# Patient Record
Sex: Female | Born: 1950
Health system: Southern US, Community
[De-identification: ages and names within clinical notes are randomized; demographics above are authoritative.]

## PROBLEM LIST (undated history)

## (undated) DIAGNOSIS — R7302 Impaired glucose tolerance (oral): Secondary | ICD-10-CM

## (undated) DIAGNOSIS — I10 Essential (primary) hypertension: Secondary | ICD-10-CM

## (undated) DIAGNOSIS — E785 Hyperlipidemia, unspecified: Secondary | ICD-10-CM

## (undated) DIAGNOSIS — I639 Cerebral infarction, unspecified: Secondary | ICD-10-CM

## (undated) DIAGNOSIS — I2510 Atherosclerotic heart disease of native coronary artery without angina pectoris: Secondary | ICD-10-CM

## (undated) DIAGNOSIS — E669 Obesity, unspecified: Secondary | ICD-10-CM

## (undated) HISTORY — DX: Impaired glucose tolerance (oral): R73.02

## (undated) HISTORY — DX: Essential (primary) hypertension: I10

## (undated) HISTORY — PX: ABDOMINAL HYSTERECTOMY: SHX81

## (undated) HISTORY — DX: Atherosclerotic heart disease of native coronary artery without angina pectoris: I25.10

## (undated) HISTORY — PX: APPENDECTOMY: SHX54

## (undated) HISTORY — DX: Cerebral infarction, unspecified: I63.9

---

## 2005-02-20 ENCOUNTER — Emergency Department (HOSPITAL_COMMUNITY): Admission: EM | Admit: 2005-02-20 | Discharge: 2005-02-20 | Payer: Self-pay | Admitting: Emergency Medicine

## 2010-07-20 LAB — HM DIABETES EYE EXAM: HM Diabetic Eye Exam: NORMAL

## 2010-08-03 LAB — HM MAMMOGRAPHY: HM Mammogram: NORMAL

## 2011-01-05 ENCOUNTER — Inpatient Hospital Stay (HOSPITAL_BASED_OUTPATIENT_CLINIC_OR_DEPARTMENT_OTHER)
Admission: EM | Admit: 2011-01-05 | Discharge: 2011-01-16 | DRG: 234 | Disposition: A | Discharge status: Home / Self Care | Payer: No Typology Code available for payment source | Attending: Surgery | Admitting: Surgery

## 2011-01-05 ENCOUNTER — Encounter: Payer: Self-pay | Admitting: *Deleted

## 2011-01-05 ENCOUNTER — Other Ambulatory Visit: Payer: Self-pay

## 2011-01-05 ENCOUNTER — Emergency Department (INDEPENDENT_AMBULATORY_CARE_PROVIDER_SITE_OTHER): Payer: No Typology Code available for payment source

## 2011-01-05 DIAGNOSIS — R079 Chest pain, unspecified: Secondary | ICD-10-CM

## 2011-01-05 DIAGNOSIS — E119 Type 2 diabetes mellitus without complications: Secondary | ICD-10-CM | POA: Diagnosis present

## 2011-01-05 DIAGNOSIS — Z7982 Long term (current) use of aspirin: Secondary | ICD-10-CM

## 2011-01-05 DIAGNOSIS — Z79899 Other long term (current) drug therapy: Secondary | ICD-10-CM

## 2011-01-05 DIAGNOSIS — I2510 Atherosclerotic heart disease of native coronary artery without angina pectoris: Principal | ICD-10-CM

## 2011-01-05 DIAGNOSIS — I2582 Chronic total occlusion of coronary artery: Secondary | ICD-10-CM | POA: Diagnosis present

## 2011-01-05 DIAGNOSIS — D62 Acute posthemorrhagic anemia: Secondary | ICD-10-CM | POA: Diagnosis not present

## 2011-01-05 DIAGNOSIS — R1110 Vomiting, unspecified: Secondary | ICD-10-CM

## 2011-01-05 DIAGNOSIS — E785 Hyperlipidemia, unspecified: Secondary | ICD-10-CM | POA: Diagnosis present

## 2011-01-05 DIAGNOSIS — M549 Dorsalgia, unspecified: Secondary | ICD-10-CM

## 2011-01-05 DIAGNOSIS — E8779 Other fluid overload: Secondary | ICD-10-CM | POA: Diagnosis not present

## 2011-01-05 DIAGNOSIS — I10 Essential (primary) hypertension: Secondary | ICD-10-CM

## 2011-01-05 HISTORY — DX: Hyperlipidemia, unspecified: E78.5

## 2011-01-05 HISTORY — DX: Obesity, unspecified: E66.9

## 2011-01-05 LAB — PROTIME-INR
INR: 0.92 (ref 0.00–1.49)
Prothrombin Time: 12.6 seconds (ref 11.6–15.2)

## 2011-01-05 LAB — COMPREHENSIVE METABOLIC PANEL
ALT: 14 U/L (ref 0–35)
Albumin: 3.8 g/dL (ref 3.5–5.2)
Alkaline Phosphatase: 114 U/L (ref 39–117)
Calcium: 9.5 mg/dL (ref 8.4–10.5)
GFR calc Af Amer: >90 mL/min (ref >90)
Glucose, Bld: 106 mg/dL — ABNORMAL HIGH (ref 70–99)
Potassium: 4.4 mEq/L (ref 3.5–5.1)
Sodium: 141 mEq/L (ref 135–145)
Total Protein: 7.8 g/dL (ref 6.0–8.3)

## 2011-01-05 LAB — CK TOTAL AND CKMB (NOT AT ARMC)
CK, MB: 3 ng/mL (ref 0.3–4.0)
Relative Index: INVALID (ref 0.0–2.5)
Total CK: 76 U/L (ref 7–177)

## 2011-01-05 LAB — CARDIAC PANEL(CRET KIN+CKTOT+MB+TROPI)
CK, MB: 3.9 ng/mL (ref 0.3–4.0)
Relative Index: INVALID (ref 0.0–2.5)
Total CK: 76 U/L (ref 7–177)
Troponin I: 0.61 ng/mL (ref <0.30)

## 2011-01-05 LAB — HEPARIN LEVEL (UNFRACTIONATED): Heparin Unfractionated: 0.23 IU/mL — ABNORMAL LOW (ref 0.30–0.70)

## 2011-01-05 LAB — DIFFERENTIAL
Basophils Absolute: 0 10*3/uL (ref 0.0–0.1)
Lymphocytes Relative: 20 % (ref 12–46)
Lymphs Abs: 1.4 10*3/uL (ref 0.7–4.0)
Neutro Abs: 4.8 10*3/uL (ref 1.7–7.7)
Neutrophils Relative %: 70 % (ref 43–77)

## 2011-01-05 LAB — D-DIMER, QUANTITATIVE: D-Dimer, Quant: 0.73 ug/mL-FEU — ABNORMAL HIGH (ref 0.00–0.48)

## 2011-01-05 LAB — CBC
MCV: 92.5 fL (ref 78.0–100.0)
Platelets: 253 10*3/uL (ref 150–400)
RBC: 4.56 MIL/uL (ref 3.87–5.11)
RDW: 13.7 % (ref 11.5–15.5)
WBC: 6.9 10*3/uL (ref 4.0–10.5)

## 2011-01-05 LAB — TROPONIN I: Troponin I: <0.3 ng/mL (ref <0.30)

## 2011-01-05 MED ORDER — NITROGLYCERIN 0.4 MG SL SUBL
0.4000 mg | SUBLINGUAL_TABLET | SUBLINGUAL | Status: DC | PRN
  Filled 2011-01-05: qty 25

## 2011-01-05 MED ORDER — HEPARIN SOD (PORCINE) IN D5W 100 UNIT/ML IV SOLN
INTRAVENOUS | Status: AC
  Administered 2011-01-05: 14:00:00 via INTRAVENOUS
  Filled 2011-01-05: qty 250

## 2011-01-05 MED ORDER — HEPARIN SOD (PORCINE) IN D5W 100 UNIT/ML IV SOLN
1000.0000 [IU]/h | INTRAVENOUS | Status: DC
  Administered 2011-01-05: 1000 [IU]/h via INTRAVENOUS
  Filled 2011-01-05: qty 250

## 2011-01-05 MED ORDER — HEPARIN SOD (PORCINE) IN D5W 100 UNIT/ML IV SOLN
1600.0000 [IU]/h | INTRAVENOUS | Status: DC
  Administered 2011-01-05: 1250 [IU]/h via INTRAVENOUS
  Administered 2011-01-06: 1600 [IU]/h via INTRAVENOUS
  Filled 2011-01-05 (×3): qty 250

## 2011-01-05 MED ORDER — REGADENOSON 0.4 MG/5ML IV SOLN
0.4000 mg | Freq: Once | INTRAVENOUS | Status: DC
  Filled 2011-01-05: qty 5

## 2011-01-05 MED ORDER — METOPROLOL TARTRATE 1 MG/ML IV SOLN
5.0000 mg | Freq: Once | INTRAVENOUS | Status: AC
  Administered 2011-01-05: 5 mg via INTRAVENOUS
  Filled 2011-01-05: qty 5

## 2011-01-05 MED ORDER — HEPARIN BOLUS VIA INFUSION
4000.0000 [IU] | Freq: Once | INTRAVENOUS | Status: AC
  Administered 2011-01-05: 4000 [IU] via INTRAVENOUS
  Filled 2011-01-05: qty 4000

## 2011-01-05 MED ORDER — ACETAMINOPHEN 325 MG PO TABS
650.0000 mg | ORAL_TABLET | ORAL | Status: DC | PRN
  Administered 2011-01-09 – 2011-01-12 (×10): 650 mg via ORAL
  Filled 2011-01-05 (×10): qty 2

## 2011-01-05 MED ORDER — SODIUM CHLORIDE 0.9 % IJ SOLN: 3.0000 mL | INTRAMUSCULAR | Status: DC | PRN

## 2011-01-05 MED ORDER — ROSUVASTATIN CALCIUM 20 MG PO TABS
20.0000 mg | ORAL_TABLET | Freq: Every day | ORAL | Status: DC
  Administered 2011-01-05 – 2011-01-16 (×11): 20 mg via ORAL
  Filled 2011-01-05 (×12): qty 1

## 2011-01-05 MED ORDER — HEPARIN (PORCINE) IN NACL 100-0.45 UNIT/ML-% IJ SOLN: 1000.0000 [IU]/h | Freq: Once | INTRAMUSCULAR | Status: DC

## 2011-01-05 MED ORDER — ALPRAZOLAM 0.25 MG PO TABS: 0.2500 mg | ORAL_TABLET | Freq: Two times a day (BID) | ORAL | Status: DC | PRN

## 2011-01-05 MED ORDER — METOPROLOL TARTRATE 50 MG PO TABS
25.0000 mg | ORAL_TABLET | Freq: Once | ORAL | Status: AC
  Administered 2011-01-05: 14:00:00 via ORAL
  Filled 2011-01-05: qty 1

## 2011-01-05 MED ORDER — HYDRALAZINE HCL 20 MG/ML IJ SOLN
10.0000 mg | Freq: Once | INTRAMUSCULAR | Status: AC
  Administered 2011-01-06: 10 mg via INTRAVENOUS
  Filled 2011-01-05: qty 0.5

## 2011-01-05 MED ORDER — HEPARIN (PORCINE) IN NACL 100-0.45 UNIT/ML-% IJ SOLN
1000.0000 [IU]/h | Freq: Once | INTRAMUSCULAR | Status: DC
  Administered 2011-01-05: 1000 [IU]/h via INTRAVENOUS
  Filled 2011-01-05: qty 250

## 2011-01-05 MED ORDER — ASPIRIN EC 81 MG PO TBEC
81.0000 mg | DELAYED_RELEASE_TABLET | Freq: Every day | ORAL | Status: DC
  Administered 2011-01-06 – 2011-01-11 (×6): 81 mg via ORAL
  Filled 2011-01-05 (×7): qty 1

## 2011-01-05 MED ORDER — SODIUM CHLORIDE 0.9 % IV SOLN: 250.0000 mL | INTRAVENOUS | Status: DC | PRN

## 2011-01-05 MED ORDER — ZOLPIDEM TARTRATE 5 MG PO TABS: 5.0000 mg | ORAL_TABLET | Freq: Every evening | ORAL | Status: DC | PRN

## 2011-01-05 MED ORDER — ASPIRIN 81 MG PO CHEW
324.0000 mg | CHEWABLE_TABLET | ORAL | Status: AC
  Administered 2011-01-05: 324 mg via ORAL
  Filled 2011-01-05: qty 4

## 2011-01-05 MED ORDER — METOPROLOL TARTRATE 25 MG PO TABS
25.0000 mg | ORAL_TABLET | Freq: Two times a day (BID) | ORAL | Status: DC
  Administered 2011-01-05 – 2011-01-11 (×13): 25 mg via ORAL
  Filled 2011-01-05 (×16): qty 1

## 2011-01-05 MED ORDER — ONDANSETRON HCL 4 MG/2ML IJ SOLN: 4.0000 mg | Freq: Four times a day (QID) | INTRAMUSCULAR | Status: DC | PRN

## 2011-01-05 MED ORDER — SODIUM CHLORIDE 0.9 % IJ SOLN: 3.0000 mL | Freq: Two times a day (BID) | INTRAMUSCULAR | Status: DC

## 2011-01-05 NOTE — ED Notes (Signed)
I took BP for nurse and helped patient to bedside toilet.

## 2011-01-05 NOTE — Consult Note (Signed)
ANTICOAGULATION CONSULT NOTE - Initial Consult  Pharmacy Consult for Heparin Indication: Chest Pain No Known Allergies  Patient Measurements: Height: 5\' 7"  (170.2 cm) Weight: 313 lb 0.9 oz (142 kg) IBW/kg (Calculated) : 61.6    Vital Signs: Temp: 97.6 F (36.4 C) (11/29 1504) Temp src: Oral (11/29 1504) BP: 189/96 mmHg (11/29 1516) Pulse Rate: 69  (11/29 1504)  Labs:  Basename 01/05/11 0915  HGB 13.7  HCT 42.2  PLT 253  APTT 33  LABPROT 12.6  INR 0.92  HEPARINUNFRC --  CREATININE 0.70  CKTOTAL 76  CKMB 3.0  TROPONINI <0.30   Estimated Creatinine Clearance: 108 ml/min (by C-G formula based on Cr of 0.7).  Medical History: Past Medical History  Diagnosis Date  . Obesity   . Hyperlipidemia     Medications: Prescriptions prior to admission  Medication Sig Dispense Refill  . aspirin 325 MG tablet Take 325 mg by mouth daily.        . Aspirin-Phytosterols (BAYER HEART ADVANTAGE) 81-400 MG TABS Take 1 tablet by mouth daily.        . Ginkgo 60 MG TABS Take 2 tablets by mouth daily. For memory       . glucosamine-chondroitin 500-400 MG tablet Take 1 tablet by mouth daily.        Chilton Si Tea, Camillia sinensis, 1000 MG TABS Take 1 tablet by mouth daily.       . Misc Natural Products (7-KETO LEAN) CAPS Take 1 tablet by mouth 2 (two) times daily. For menopause symptons       . Misc Natural Products (PRO HERBS RELAX/EASE TENSION PO) Take by mouth. ashwagandah tab         Assessment: Patient transferred from [facility] on Heparin infusion for chest pain.  Goal of Therapy:  Heparin level 0.3-0.7 units/ml   Plan:  Stat Heparin level. Adjust Heparin as indicated.  Anastasia Tompson, Elisha Headland, Pharm.D. 01/05/2011 7:40 PM

## 2011-01-05 NOTE — ED Notes (Signed)
Patient states approximately one hour pta, she developed sudden onset of chest pressure in her central chest with radiation into her back.  States she had same symptoms on Thanksgiving day which was associated with belching and vomited x 1 and symptoms were relieved and has not returned until today.

## 2011-01-05 NOTE — Progress Notes (Signed)
Patient troponin 0.61. Dayna Dunn Corinda Gubler PA) notified. Patient resting comfortably/ Normal sinus rhythm 70 bpm..  No new orders received.  Will continue to monitor patient.

## 2011-01-05 NOTE — ED Notes (Signed)
I met patient in waiting room. Patient had third party [facility]. Patient had EMS evaluation at her work, but refused transport.  EMS followed patient to our facility to ensure her safety.Patient stated "I do not know what all the fuss is about". I took patient to room and performed ecg. 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

## 2011-01-05 NOTE — ED Provider Notes (Signed)
History     CSN: [id number] Arrival date & time: 01/05/2011  8:23 AM   First MD Initiated Contact with Patient 01/05/11 [phone]      Chief Complaint  Patient presents with  . Chest Pain    (Consider location/radiation/quality/duration/timing/severity/associated sxs/prior treatment) HPI Patient had anterior chest pressure began about 0600 while getting ready for work.  Pain radiated to right and down to right hand numbness and to back.  Short of breath and weakness associated with symptoms.  Symptoms lasted about 10 minutes resolved on its own after rest.  Patient took asa 325 at home.  One similar episode on Thanksgiving Day.  Patient took pills containing "green tea",  Herbal tablet ashwaguanda, and sevenketo- supposed to increase metabolism.  Patient has been taking same for 5-6 months.  Pain was 4/10 now 0/10.  Patient with last physical 2 years ago at urgent care in [place].   Past Medical History  Diagnosis Date  . Obesity     No past surgical history on file.  No family history on file.  History  Substance Use Topics  . Smoking status: Never Smoker   . Smokeless tobacco: Not on file  . Alcohol Use: No    OB History    Grav Para Term Preterm Abortions TAB SAB Ect Mult Living                  Review of Systems  All other systems reviewed and are negative.    Allergies  Review of patient's allergies indicates no known allergies.  Home Medications   Current Outpatient Rx  Name Route Sig Dispense Refill  . GREEN TEA (CAMILLIA SINENSIS) 1000 MG PO TABS Oral Take by mouth.        BP 214/99  Pulse 93  Temp(Src) 98.3 F (36.8 C) (Oral)  Resp 20  Ht 5\' 7"  (1.702 m)  Wt 250 lb (113.399 kg)  BMI 39.16 kg/m2  SpO2 99%  Physical Exam  Nursing note and vitals reviewed. Constitutional: She is oriented to person, place, and time. She appears well-developed and well-nourished.  HENT:  Head: Normocephalic and atraumatic.  Right Ear: External ear normal.  Left  Ear: External ear normal.  Nose: Nose normal.  Mouth/Throat: Oropharynx is clear and moist.  Eyes: Conjunctivae and EOM are normal. Pupils are equal, round, and reactive to light.  Neck: Normal range of motion. Neck supple.  Cardiovascular: Normal rate, regular rhythm, normal heart sounds and intact distal pulses.   Pulmonary/Chest: Effort normal and breath sounds normal.  Abdominal: Soft. Bowel sounds are normal.  Musculoskeletal: Normal range of motion.  Neurological: She is alert and oriented to person, place, and time. She has normal reflexes.  Skin: Skin is warm and dry.  Psychiatric: She has a normal mood and affect. Her behavior is normal. Judgment and thought content normal.    ED Course  Procedures (including critical care time)  Labs Reviewed - No data to display No results found.   No diagnosis found.    MDM   Date: 01/05/2011  Rate: 89  Rhythm: normal sinus rhythm  QRS Axis: normal  Intervals: normal  ST/T Wave abnormalities:inferior q waves noted. t wave inversion III and avf  Conduction Disutrbances:none  Narrative Interpretation:   Old EKG Reviewed: none available  Patient care discussed with Dr. Antoine Poche and patient to be transferred to [facility].  Hilario Quarry, MD 01/05/11 [phone]

## 2011-01-05 NOTE — ED Notes (Signed)
Recalled cardiology --on room 6 for dr. Rosalia Hammers.

## 2011-01-05 NOTE — H&P (Signed)
HPI: 60 year old female with past medical history of hyperlipidemia who presents with chest pain and hypertensive urgency. She has no prior cardiac history. She does have dyspnea on exertion but denies orthopnea, PND, pedal edema, palpitations, syncope, claudication or exertional chest pain. This morning while coming downstairs at home she developed right-sided chest pressure. The pain was not pleuritic, positional or related to food. It radiated to her right upper extremity. There was no associated nausea, vomiting or diaphoresis. The pain resolved after 5-10 minutes. She went to work and felt weak. Her blood pressure was noted to be elevated with a systolic of 200. She therefore went to the [place] [facility] room and was transferred here for further evaluation. She is presently asymptomatic.  Medications Prior to Admission  Medication Dose Route Frequency Provider Last Rate Last Dose  . heparin 100 UNIT/ML infusion           . heparin 100 units/mL bolus via infusion 4,000 Units  4,000 Units Intravenous Once Hilario Quarry, MD   4,000 Units at 01/05/11 1403  . heparin ADULT infusion 100 units/mL (25000 units/250 mL)  1,000 Units/hr Intravenous Once Hilario Quarry, MD      . metoprolol (LOPRESSOR) injection 5 mg  5 mg Intravenous Once Hilario Quarry, MD   5 mg at 01/05/11 0928  . metoprolol (LOPRESSOR) tablet 25 mg  25 mg Oral Once Hilario Quarry, MD       No current outpatient prescriptions on file as of 01/05/2011.    No Known Allergies  Past Medical History  Diagnosis Date  . Obesity   . Hyperlipidemia     Past Surgical History  Procedure Date  . Abdominal hysterectomy   . Appendectomy     History   Social History  . Marital Status: Married    Spouse Name: N/A    Number of Children: 2  . Years of Education: N/A   Occupational History  .     Social History Main Topics  . Smoking status: Never Smoker   . Smokeless tobacco: Not on file  . Alcohol Use: Yes     Occasional    . Drug Use: No  . Sexually Active: Not on file   Other Topics Concern  . Not on file   Social History Narrative  . No narrative on file    Family History  Problem Relation Age of Onset  . Coronary artery disease      No family history    ROS: no fevers or chills, productive cough, hemoptysis, dysphasia, odynophagia, melena, hematochezia, dysuria, hematuria, rash, seizure activity, orthopnea, PND, pedal edema, claudication. Remaining systems are negative.  Physical Exam:   Blood pressure 189/96, pulse 69, temperature 97.6 F (36.4 C), temperature source Oral, resp. rate 20, height 5\' 7"  (1.702 m), weight 313 lb 0.9 oz (142 kg), SpO2 98.00%.  General:  Well developed/obese in NAD Skin warm/dry Patient not depressed No peripheral clubbing Back-normal HEENT-normal/normal eyelids Neck supple/normal carotid upstroke bilaterally; no bruits; no JVD; no thyromegaly chest - CTA/ normal expansion CV - RRR/normal S1 and S2; no rubs or gallops;  PMI nondisplaced; 1/6 systolic ejection murmur left sternal border. Abdomen -NT/ND, no HSM, no mass, + bowel sounds, no bruit 2+ femoral pulses, no bruits Ext-no edema, chords, 2+ DP Neuro-grossly nonfocal  ECG normal sinus rhythm, left ventricular hypertrophy, cannot rule out prior inferior infarct, inferior T-wave inversion.  Results for orders placed during the hospital encounter of 01/05/11 (from the past 48 hour(s))  CBC     Status: Normal   Collection Time   01/05/11  9:15 AM      Component Value Range Comment   WBC 6.9  4.0 - 10.5 (K/uL)    RBC 4.56  3.87 - 5.11 (MIL/uL)    Hemoglobin 13.7  12.0 - 15.0 (g/dL)    HCT 40.9  81.1 - 91.4 (%)    MCV 92.5  78.0 - 100.0 (fL)    MCH 30.0  26.0 - 34.0 (pg)    MCHC 32.5  30.0 - 36.0 (g/dL)    RDW 78.2  95.6 - 21.3 (%)    Platelets 253  150 - 400 (K/uL)   DIFFERENTIAL     Status: Normal   Collection Time   01/05/11  9:15 AM      Component Value Range Comment   Neutrophils Relative 70   43 - 77 (%)    Neutro Abs 4.8  1.7 - 7.7 (K/uL)    Lymphocytes Relative 20  12 - 46 (%)    Lymphs Abs 1.4  0.7 - 4.0 (K/uL)    Monocytes Relative 8  3 - 12 (%)    Monocytes Absolute 0.6  0.1 - 1.0 (K/uL)    Eosinophils Relative 1  0 - 5 (%)    Eosinophils Absolute 0.1  0.0 - 0.7 (K/uL)    Basophils Relative 1  0 - 1 (%)    Basophils Absolute 0.0  0.0 - 0.1 (K/uL)   CK TOTAL AND CKMB     Status: Normal   Collection Time   01/05/11  9:15 AM      Component Value Range Comment   Total CK 76  7 - 177 (U/L)    CK, MB 3.0  0.3 - 4.0 (ng/mL)    Relative Index RELATIVE INDEX IS INVALID  0.0 - 2.5    COMPREHENSIVE METABOLIC PANEL     Status: Abnormal   Collection Time   01/05/11  9:15 AM      Component Value Range Comment   Sodium 141  135 - 145 (mEq/L)    Potassium 4.4  3.5 - 5.1 (mEq/L)    Chloride 105  96 - 112 (mEq/L)    CO2 26  19 - 32 (mEq/L)    Glucose, Bld 106 (*) 70 - 99 (mg/dL)    BUN 18  6 - 23 (mg/dL)    Creatinine, Ser 0.86  0.50 - 1.10 (mg/dL)    Calcium 9.5  8.4 - 10.5 (mg/dL)    Total Protein 7.8  6.0 - 8.3 (g/dL)    Albumin 3.8  3.5 - 5.2 (g/dL)    AST 13  0 - 37 (U/L)    ALT 14  0 - 35 (U/L)    Alkaline Phosphatase 114  39 - 117 (U/L)    Total Bilirubin 0.2 (*) 0.3 - 1.2 (mg/dL)    GFR calc non Af Amer >90  >90 (mL/min)    GFR calc Af Amer >90  >90 (mL/min)   TROPONIN I     Status: Normal   Collection Time   01/05/11  9:15 AM      Component Value Range Comment   Troponin I <0.30  <0.30 (ng/mL)   APTT     Status: Normal   Collection Time   01/05/11  9:15 AM      Component Value Range Comment   aPTT 33  24 - 37 (seconds)   PROTIME-INR     Status: Normal  Collection Time   01/05/11  9:15 AM      Component Value Range Comment   Prothrombin Time 12.6  11.6 - 15.2 (seconds)    INR 0.92  0.00 - 1.49      Dg Chest Port 1 View  01/05/2011  *RADIOLOGY REPORT*  Clinical Data: Chest pain radiating to back.  Vomiting.  PORTABLE CHEST - 1 VIEW  Comparison: None.   Findings: Low lung volumes are seen however both lungs are clear. Heart size is normal.  No evidence of pleural effusion.  IMPRESSION: Low lung volumes.  No active disease.  Original Report Authenticated By: Danae Orleans, M.D.    Assessment/Plan Patient Active Hospital Problem List:  #1-chest pain-symptoms are atypical and short lived. However she does have inferior T-wave inversion. Initial enzymes negative. Plan rule out with followup enzymes and if negative proceed with Lexiscan Myoview for risk stratification. She does not have risk factors for pulmonary embolus. Her blood pressure was elevated on admission but I think her symptoms are unlikely to be a dissection. We will check a d-dimer. If positive proceed with CT scan. Continue aspirin and heparin.  #2-hypertension-blood pressure was significantly elevated at the time of admission. She also has left ventricular hypertrophy on her electrocardiogram. She does not see a physician routinely. She most likely has undiagnosed and uncontrolled hypertension. Add Lopressor 25 mg by mouth twice a day and increase as needed. Add additional medications as needed. Check echocardiogram for left ventricular hypertrophy and to quantify LV function. Check TSH. Will increase meds slowly for gradual reduction in LV function.   Olga Millers MD 01/05/2011, 5:39 PM

## 2011-01-06 ENCOUNTER — Encounter (HOSPITAL_COMMUNITY): Payer: Self-pay | Admitting: Cardiology

## 2011-01-06 ENCOUNTER — Other Ambulatory Visit: Payer: Self-pay

## 2011-01-06 ENCOUNTER — Encounter (HOSPITAL_COMMUNITY)
Admission: EM | Disposition: A | Discharge status: Home / Self Care | Payer: Self-pay | Source: Home / Self Care | Attending: Cardiology

## 2011-01-06 DIAGNOSIS — I2510 Atherosclerotic heart disease of native coronary artery without angina pectoris: Secondary | ICD-10-CM

## 2011-01-06 DIAGNOSIS — R072 Precordial pain: Secondary | ICD-10-CM

## 2011-01-06 HISTORY — PX: LEFT HEART CATHETERIZATION WITH CORONARY ANGIOGRAM: SHX5451

## 2011-01-06 LAB — CARDIAC PANEL(CRET KIN+CKTOT+MB+TROPI)
CK, MB: 2.8 ng/mL (ref 0.3–4.0)
Relative Index: INVALID (ref 0.0–2.5)
Troponin I: 0.35 ng/mL (ref <0.30)
Troponin I: <0.3 ng/mL (ref <0.30)

## 2011-01-06 LAB — BASIC METABOLIC PANEL
CO2: 25 mEq/L (ref 19–32)
Chloride: 106 mEq/L (ref 96–112)
Creatinine, Ser: 0.71 mg/dL (ref 0.50–1.10)
GFR calc Af Amer: >90 mL/min (ref >90)
Potassium: 4.1 mEq/L (ref 3.5–5.1)
Sodium: 142 mEq/L (ref 135–145)

## 2011-01-06 LAB — LIPID PANEL
Cholesterol: 212 mg/dL — ABNORMAL HIGH (ref 0–200)
HDL: 46 mg/dL (ref >39)
Total CHOL/HDL Ratio: 4.6 RATIO

## 2011-01-06 LAB — CBC
MCH: 30.4 pg (ref 26.0–34.0)
MCHC: 32.5 g/dL (ref 30.0–36.0)
MCV: 93.9 fL (ref 78.0–100.0)
Platelets: 231 10*3/uL (ref 150–400)
Platelets: 235 10*3/uL (ref 150–400)
RBC: 4.08 MIL/uL (ref 3.87–5.11)
RDW: 13.9 % (ref 11.5–15.5)
WBC: 7.8 10*3/uL (ref 4.0–10.5)

## 2011-01-06 LAB — HEPARIN LEVEL (UNFRACTIONATED): Heparin Unfractionated: 0.18 IU/mL — ABNORMAL LOW (ref 0.30–0.70)

## 2011-01-06 SURGERY — LEFT HEART CATHETERIZATION WITH CORONARY ANGIOGRAM
Anesthesia: LOCAL

## 2011-01-06 MED ORDER — HEPARIN BOLUS VIA INFUSION
3000.0000 [IU] | Freq: Once | INTRAVENOUS | Status: AC
  Administered 2011-01-06 (×2): 3000 [IU] via INTRAVENOUS
  Filled 2011-01-06: qty 3000

## 2011-01-06 MED ORDER — VERAPAMIL HCL 2.5 MG/ML IV SOLN
INTRAVENOUS | Status: AC
  Filled 2011-01-06: qty 2

## 2011-01-06 MED ORDER — MIDAZOLAM HCL 2 MG/2ML IJ SOLN
INTRAMUSCULAR | Status: AC
  Filled 2011-01-06: qty 2

## 2011-01-06 MED ORDER — HEPARIN SOD (PORCINE) IN D5W 100 UNIT/ML IV SOLN
1350.0000 [IU]/h | INTRAVENOUS | Status: DC
  Administered 2011-01-07 – 2011-01-08 (×2): 1600 [IU]/h via INTRAVENOUS
  Administered 2011-01-09: 1300 [IU]/h via INTRAVENOUS
  Administered 2011-01-09: 1200 [IU]/h via INTRAVENOUS
  Administered 2011-01-10: 1250 [IU]/h via INTRAVENOUS
  Administered 2011-01-11: 1350 [IU]/h via INTRAVENOUS
  Filled 2011-01-06 (×10): qty 250

## 2011-01-06 MED ORDER — HEPARIN (PORCINE) IN NACL 2-0.9 UNIT/ML-% IJ SOLN
INTRAMUSCULAR | Status: AC
  Filled 2011-01-06: qty 2000

## 2011-01-06 MED ORDER — SODIUM CHLORIDE 0.9 % IJ SOLN: 3.0000 mL | INTRAMUSCULAR | Status: DC | PRN

## 2011-01-06 MED ORDER — SODIUM CHLORIDE 0.9 % IV SOLN
INTRAVENOUS | Status: AC
  Administered 2011-01-06: 17:00:00 via INTRAVENOUS

## 2011-01-06 MED ORDER — ASPIRIN 81 MG PO CHEW: 324.0000 mg | CHEWABLE_TABLET | ORAL | Status: DC

## 2011-01-06 MED ORDER — LABETALOL HCL 5 MG/ML IV SOLN
INTRAVENOUS | Status: AC
  Filled 2011-01-06: qty 4

## 2011-01-06 MED ORDER — NITROGLYCERIN 0.2 MG/ML ON CALL CATH LAB
INTRAVENOUS | Status: AC
  Filled 2011-01-06: qty 1

## 2011-01-06 MED ORDER — LIDOCAINE HCL (PF) 1 % IJ SOLN
INTRAMUSCULAR | Status: AC
  Filled 2011-01-06: qty 30

## 2011-01-06 MED ORDER — HEPARIN SODIUM (PORCINE) 1000 UNIT/ML IJ SOLN
INTRAMUSCULAR | Status: AC
  Filled 2011-01-06: qty 1

## 2011-01-06 MED ORDER — FENTANYL CITRATE 0.05 MG/ML IJ SOLN
INTRAMUSCULAR | Status: AC
  Filled 2011-01-06: qty 2

## 2011-01-06 MED ORDER — SODIUM CHLORIDE 0.9 % IJ SOLN: 3.0000 mL | Freq: Two times a day (BID) | INTRAMUSCULAR | Status: DC

## 2011-01-06 MED ORDER — SODIUM CHLORIDE 0.9 % IV SOLN: 250.0000 mL | INTRAVENOUS | Status: DC | PRN

## 2011-01-06 MED ORDER — LABETALOL HCL 5 MG/ML IV SOLN
10.0000 mg | Freq: Once | INTRAVENOUS | Status: AC
  Administered 2011-01-06: 10 mg via INTRAVENOUS

## 2011-01-06 NOTE — H&P (View-Only) (Signed)
Patient ID: Melonee Gerstel, female   DOB: [birth year], 60 y.o.   MRN: [id number] I have reviewed her findings.  She was scheduled for cardiac cath by Dr. Ladona Ridgel.  Have discussed the risks and benefits with her, and explained the indications to her and her family.  I have answered all questions, and reviewed her labs.  Therefore, we will proceed with diagnostic cath and evaluate findings.  She is agreeable.   Shawnie Pons 01/06/2011 3:09 PM

## 2011-01-06 NOTE — Progress Notes (Signed)
Pt received Cardiac Surgery Information booklet.

## 2011-01-06 NOTE — Progress Notes (Signed)
ANTICOAGULATION CONSULT NOTE - Follow Up Consult  Pharmacy Consult for Heparin Indication: 3V CAD  No Known Allergies   Vital Signs: Temp: 97.9 F (36.6 C) (11/30 1328) Temp src: Oral (11/30 1328) BP: 166/84 mmHg (11/30 1328) Pulse Rate: 71  (11/30 1518)  Labs:  Basename 01/06/11 1045 01/06/11 1040 01/06/11 0322 01/06/11 0314 01/05/11 2021 01/05/11 1953 01/05/11 0915  HGB -- 13.3 12.4 -- -- -- --  HCT -- 40.9 38.3 -- -- -- 42.2  PLT -- 235 231 -- -- -- 253  APTT -- -- -- -- -- -- 33  LABPROT -- -- -- -- -- -- 12.6  INR -- -- -- -- -- -- 0.92  HEPARINUNFRC -- 0.43 -- 0.18* 0.23* -- --  CREATININE -- -- 0.71 -- -- -- 0.70  CKTOTAL 72 -- -- 70 -- 76 --  CKMB 2.8 -- -- 3.1 -- 3.9 --  TROPONINI <0.30 -- -- 0.35* -- 0.61* --   Estimated Creatinine Clearance: 108 ml/min (by C-G formula based on Cr of 0.71).   Medications:  Scheduled:     . aspirin  324 mg Oral NOW  . aspirin EC  81 mg Oral Daily  . fentaNYL      . heparin  3,000 Units Intravenous Once  . heparin      . heparin      . hydrALAZINE  10 mg Intravenous Once  . labetalol      . labetalol  10 mg Intravenous Once  . lidocaine      . metoprolol tartrate  25 mg Oral BID  . midazolam      . midazolam      . nitroGLYCERIN      . rosuvastatin  20 mg Oral Daily  . verapamil      . DISCONTD: aspirin  324 mg Oral NOW  . DISCONTD: heparin  1,000 Units/hr Intravenous Once  . DISCONTD: regadenoson  0.4 mg Intravenous Once  . DISCONTD: sodium chloride  3 mL Intravenous Q12H  . DISCONTD: sodium chloride  3 mL Intravenous Q12H    Assessment: 60 yo M to resume heparin s/p cath 8 hrs after sheath removal for 3V CAD. Sheath was removed at 16:30. Patient awaiting surgical consult for CAD.  Goal of Therapy:  Heparin level 0.3 - 0.5  Plan:  1. Resume heparin 1600 units/hr (16 ml/hr) on 12/1 at 00:30, 8 hrs after sheath removal 2. Heparin level 6 hrs after resumed 3. Daily heparin level and CBC  Loura Back  Danielle 01/06/2011,6:23 PM

## 2011-01-06 NOTE — Op Note (Signed)
Cardiac Catheterization Procedure Note  Name: Megan Atkinson MRN: [id number] DOB: 07/15/1950  Procedure: Left Heart Cath, Selective Coronary Angiography, LV angiography  Indication: Chest pain with markedly abnormal ECG.   Procedural Details: The patient was given labetolol 20 mg IV to reduce BP prior to starting the procedure as BP was in excess of systolic.  It came down nicely, but she was markedly uncomfortable on the table throughout because of buttock pain.  The right wrist was prepped, draped, and anesthetized with 1% lidocaine. Using the modified Seldinger technique, a 5 French sheath was introduced into the right radial artery. 3 mg of verapamil was administered through the sheath, weight-based unfractionated heparin was administered intravenously. Standard Judkins catheters were used for selective coronary angiography and left ventriculography. Catheter exchanges were performed over an exchange length guidewire. There were no immediate procedural complications. A TR band was used for radial hemostasis at the completion of the procedure.  The patient was transferred to the post catheterization recovery area for further monitoring.  Procedural Findings: Hemodynamics: AO 156/78 (111) LV 180/18  Coronary angiography: Coronary dominance: right  Left mainstem: The left main coronary artery was a large-caliber vessel that was free of significant disease it divided into left anterior descending and circumflex vessel.  Left anterior descending (LAD): The left anterior descending artery is a large-caliber vessel that wraps the apex. Just after the septal perforator, there was a 90-95% stenosis. The lesion was segmental. The distal vessel is large in caliber, and suitable for grafting. There is a first diagonal branch that had approximately 90% mid narrowing with a somewhat smaller distal vessel. The second diagonal had 50% proximal narrowing but was fairly small in caliber. The third  diagonal branch was free of disease.  Left circumflex (LCx): The circumflex coronary artery provided a large first marginal branch which had about 50% narrowing in its proximal portion. It was not critically diseased the AV circumflex supplied 2 posterolateral branches with some mild luminal irregularities, but noncritical disease.  Right coronary artery (RCA): The right coronary artery is segmentally plaqued near the proximal mid junction without significant narrowing and basically totally occluded after a right ventricular branch. The vessel fills by late collaterals during the left coronary artery injection.  Left ventriculography: Left ventricular systolic function is normal, LVEF is estimated at >65%, there is no significant mitral regurgitation. Proximal aortic root appears normal.   Final Conclusions:   #1. Total occlusion of the right coronary artery.       #2 high-grade stenosis of the left anterior descending artery just past the septal perforator.  #3 high-grade stenosis of the first diagonal.  #4 well-preserved left ventricular systolic function without wall motion abnormalities.  Recommendations: The patient has a markedly abnormal EKG. She had difficult time being comfortable on the Cath Lab table. She is a total occlusion of the right coronary artery, high-grade stenosis of the LAD, as well as significant involvement of the diagonal. Given the multivessel findings, including left anterior descending artery, surgical consultation will be obtained. I have shown the films to the family, and reviewed the findings with them, and a consult to [facility] has been obtained.  Shawnie Pons, MD, [facility], FSCAI 01/06/2011, 4:59 PM

## 2011-01-06 NOTE — Progress Notes (Deleted)
Patient noted overnight to have mildly elevated troponins x 2. Was maintained on heparin/ASA confirmed with nursing last night. I initially did not cancel the nuc in anticipation of further enzymes (CP initially felt atypical). Also note mildly elevated d-dimer (initial order called for CT angio if positive but this does not appear to be pending). Unfortunately, the patient was called for down to nuclear before being rounded on this AM for decision re: further mgmt. I discussed this with Ward Givens who will call down to nuc to cancel pending further eval.   Yanet Balliet 01/06/2011 9:24 AM

## 2011-01-06 NOTE — Progress Notes (Signed)
Patient noted overnight to have mildly elevated troponins x 2. Was maintained on heparin/ASA confirmed with nursing last night. I initially did not cancel the nuc in anticipation of further enzymes (CP initially felt atypical). Also note mildly elevated d-dimer (initial order called for CT angio if positive but this does not appear to be pending). Unfortunately, the patient was called for down to nuclear before being rounded on this AM for decision re: further mgmt. I discussed this with Ward Givens who will call down to nuc to cancel pending further eval.   Quency Tober 01/06/2011 9:41 AM

## 2011-01-06 NOTE — Progress Notes (Signed)
Patient c/o of chest 'heaviness' 4/10, on the right side chest/arm and back.  One NTG 0.4mg  SL given, O2 applied at 2L.  BP 164/84, HR 87, O2 sat 98%,  Patient stated relief after the NTG given.  Instructed pt. To remain in bed for now and call if symptoms return.  Baird Lyons 7:52 AM 01/06/11

## 2011-01-06 NOTE — Consults (Signed)
CARDIOTHORACIC SURGERY CONSULTATION REPORT  PCP is No primary provider on file. Attending physician is Olga Millers, MD Referring Provider is Shawnie Pons, MD   Reason for consultation:  Severe 2 vessel CAD  HPI:  Patient is a 60 year old morbidly obese African American female from [place] with no previous history of coronary artery disease. She describes a long progressive history of worsening exertional shortness of breath and the recent development of symptoms of right-sided chest pressure with physical activity consistent with angina pectoris. These symptoms have always brought on with physical activity and relieved by rest. She denies any chest discomfort occurring at rest. She denies any shortness of breath at rest. She denies PND, orthopnea, or lower extremity edema. She was admitted to the hospital yesterday feeling particularly week and short of breath. She went to the emergency room where she was noted to have severe hypertension. She was admitted to the hospital and has ruled out for acute myocardial infarction by serial cardiac enzymes.  She underwent cardiac catheterization earlier today by Dr. Riley Kill. She was found to have severe two-vessel coronary artery disease with preserved left ventricular function. Cardiothoracic surgical consultation was requested.  Past Medical History  Diagnosis Date  . Obesity   . Hyperlipidemia     Past Surgical History  Procedure Date  . Abdominal hysterectomy   . Appendectomy     Family History  Problem Relation Age of Onset  . Coronary artery disease      No family history    Social History History  Substance Use Topics  . Smoking status: Never Smoker   . Smokeless tobacco: Not on file  . Alcohol Use: Yes     Occasional    Current Facility-Administered Medications  Medication Dose Route Frequency Provider Last Rate Last Dose  . 0.9 %  sodium chloride infusion   Intravenous Continuous Shawnie Pons, MD      .  acetaminophen (TYLENOL) tablet 650 mg  650 mg Oral Q4H PRN Laurann [place], PA      . ALPRAZolam Prudy Feeler) tablet 0.25 mg  0.25 mg Oral BID PRN Dayna N Dunn, PA      . aspirin chewable tablet 324 mg  324 mg Oral NOW Dayna N Dunn, PA   324 mg at 01/05/11 2151  . aspirin EC tablet 81 mg  81 mg Oral Daily Dayna N Dunn, PA   81 mg at 01/06/11 1014  . fentaNYL (SUBLIMAZE) 0.05 MG/ML injection           . heparin 100 units/mL bolus via infusion 3,000 Units  3,000 Units Intravenous Once [employer], PHARMD   3,000 Units at 01/06/11 0505  . heparin 1000 UNIT/ML injection           . heparin 2-0.9 UNIT/ML-% infusion           . heparin ADULT infusion 100 units/ml (25000 units/250 ml)  1,600 Units/hr Intravenous Continuous [facility], [place]      . hydrALAZINE (APRESOLINE) injection 10 mg  10 mg Intravenous Once [employer], PA   10 mg at 01/06/11 0701  . labetalol (NORMODYNE,TRANDATE) 5 MG/ML injection           . labetalol (NORMODYNE,TRANDATE) injection 10 mg  10 mg Intravenous Once Shawnie Pons, MD   10 mg at 01/06/11 1726  . lidocaine (XYLOCAINE) 1 % injection           . metoprolol tartrate (LOPRESSOR) tablet 25 mg  25 mg Oral BID Dayna N Dunn, PA   25 mg at 01/06/11 1014  . midazolam (VERSED) 2 MG/2ML injection           . midazolam (VERSED) 2 MG/2ML injection           . nitroGLYCERIN (NITROSTAT) SL tablet 0.4 mg  0.4 mg Sublingual Q5 min PRN Dayna N Dunn, PA      . nitroGLYCERIN (NTG ON-CALL) 0.2 mg/mL injection           . ondansetron (ZOFRAN) injection 4 mg  4 mg Intravenous Q6H PRN Dayna N Dunn, PA      . rosuvastatin (CRESTOR) tablet 20 mg  20 mg Oral Daily Dayna N Dunn, PA   20 mg at 01/06/11 1014  . verapamil (ISOPTIN) 2.5 MG/ML injection           . zolpidem (AMBIEN) tablet 5 mg  5 mg Oral QHS PRN Dayna N Dunn, PA      . DISCONTD: 0.9 %  sodium chloride infusion  250 mL Intravenous PRN Dayna N Dunn, PA      . DISCONTD: 0.9 %  sodium chloride infusion  250 mL  Intravenous PRN Lewayne Bunting, MD      . DISCONTD: aspirin chewable tablet 324 mg  324 mg Oral NOW Lewayne Bunting, MD      . DISCONTD: heparin ADULT infusion 100 units/ml (25000 units/250 ml)  1,000 Units/hr Intravenous Continuous Mickeal Skinner, PHARMD 10 mL/hr at 01/05/11 2153 1,000 Units/hr at 01/05/11 2153  . DISCONTD: heparin ADULT infusion 100 units/ml (25000 units/250 ml)  1,600 Units/hr Intravenous Continuous Gary Fleet Abbott, PHARMD 16 mL/hr at 01/06/11 0504 1,600 Units/hr at 01/06/11 0504  . DISCONTD: regadenoson (LEXISCAN) injection SOLN 0.4 mg  0.4 mg Intravenous Once [employer], PA      . DISCONTD: sodium chloride 0.9 % injection 3 mL  3 mL Intravenous Q12H Dayna N Dunn, PA      . DISCONTD: sodium chloride 0.9 % injection 3 mL  3 mL Intravenous PRN Dayna N Dunn, PA      . DISCONTD: sodium chloride 0.9 % injection 3 mL  3 mL Intravenous Q12H Lewayne Bunting, MD      . DISCONTD: sodium chloride 0.9 % injection 3 mL  3 mL Intravenous PRN Lewayne Bunting, MD        No Known Allergies  Review of Systems:  General:  normal appetite, normal energy, no weight gain/loss  Respiratory:  no cough, no wheezing, no hemoptysis, no pain with inspiration or cough, no shortness of breath except for chronic exertional SOB  Cardiac:   + exertional chest pressure, + exertional SOB, no resting SOB, no PND, no orthopnea, no LE edema, no palpitations, no syncope  GI:   no difficulty swallowing, no hematochezia, no hematemesis, no melena, no constipation, no diarrhea   GU:   no dysuria, no urgency, no frequency   Musculoskeletal: Chronic arthritis both knees but ambulates without assistance  Vascular:  no pain suggestive of claudication   Neuro:   no symptoms suggestive of TIA's, no seizures, no headaches, no peripheral neuropathy   Endocrine:  Negative.  Denies any history of diabetes  HEENT:  no loose teeth or painful teeth,  no recent vision changes  Psych:   no anxiety, no depression    Physical  Exam:   BP 166/84  Pulse 71  Temp(Src) 97.9 F (36.6 C) (Oral)  Resp 20  Ht 5\' 7"  (1.702 m)  Wt 142 kg (313 lb 0.9 oz)  BMI 49.03 kg/m2  SpO2 96%  General:  Morbidly obese but otherwise well-appearing  HEENT:  Unremarkable   Neck:   no JVD, no bruits, no adenopathy   Chest:   clear to auscultation, symmetrical breath sounds, no wheezes, no rhonchi   CV:   RRR, no  murmur   Abdomen:  soft, non-tender, no masses, extremely large  Extremities:  warm, well-perfused, pulses non-palpable  Rectal/GU  Deferred  Neuro:   Grossly non-focal and symmetrical throughout  Skin:   Clean and dry, no rashes, no breakdown  Diagnostic Tests:  Cardiac catheterization performed by Dr. Riley Kill is reviewed. There is severe two-vessel coronary artery disease with preserved left ventricular function. Specifically, there is 90-95% proximal stenosis of the left anterior descending coronary artery. This stenosis is relatively short and potentially could be addressed with percutaneous coronary intervention. There is a first diagonal branch that has 90% stenosis but this vessel is diffusely diseased. The left circumflex coronary artery is large and codominant. There is no significant flow limiting disease. There is 100% occlusion of the mid right coronary artery. There is left to right collateral filling of the distal portion of the right coronary artery. There is codominant coronary circulation and the terminal branches of the right coronary artery are small and appears somewhat diffusely diseased. There is normal left ventricular function with ejection fraction estimated 65%. No other significant abnormalities are noted.  Impression:  Severe two-vessel coronary artery disease with normal left ventricular function. Anatomically I expect that the patient would have improved long-term outcome with surgical revascularization. However, the terminal branches of the right coronary artery are small and diffusely diseased.  Percutaneous coronary intervention with placement of drug-eluting stent in the left anterior descending coronary artery would be a reasonable alternative. Risks of surgery will be somewhat elevated because of the patient's morbid obesity. The added benefit of bypass grafting for the right coronary artery is probably marginal or nonexistent. In my opinion the real question is how much added benefit would placement of left internal mammary artery graft to the distal left anterior descending coronary artery be in comparison to placement of a drug-eluting stent in the left anterior descending coronary artery.  Plan:  I've discussed matters at length with the patient, her husband, and her son here at alternative treatment strategies been discussed in detail. Relative risks and benefits of each approach have been discussed and all their questions are addressed. There thinking things over. I will plan to discuss this further with Dr. Riley Kill and continue to followup over the weekend. If the patient decides that she wants surgery we will try to make arrangements for surgery sometime next week with one of my partners as schedule permits as I will be out of town.    Salvatore Decent. Cornelius Moras, MD

## 2011-01-06 NOTE — Progress Notes (Signed)
Patient ID: Megan Atkinson, female   DOB: 05/01/1950, 60 y.o.   MRN: [id number] Subjective:  Chest pain improved. No sob.  Objective:  Vital Signs in the last 24 hours: Temp:  [97.6 F (36.4 C)-98.9 F (37.2 C)] 98.5 F (36.9 C) (11/30 0649) Pulse Rate:  [67-85] 85  (11/30 1014) Resp:  [16-20] 19  (11/30 0649) BP: (120-190)/(82-101) 149/91 mmHg (11/30 1014) SpO2:  [95 %-100 %] 98 % (11/30 0748) Weight:  [142 kg (313 lb 0.9 oz)] 313 lb 0.9 oz (142 kg) (11/29 1504)  Intake/Output from previous day: 11/29 0701 - 11/30 0700 In: 1008 [P.O.:840; I.V.:168] Out: 1300 [Urine:1300] Intake/Output from this shift:    Physical Exam: Well appearing NAD HEENT: Unremarkable Neck:  No JVD, no thyromegally Lungs:  Clear with no wheezes. HEART:  Regular rate rhythm, no murmurs, no rubs, no clicks Abd:  Flat, positive bowel sounds, no organomegally, no rebound, no guarding Ext:  2 plus pulses, no edema, no cyanosis, no clubbing Skin:  No rashes no nodules Neuro:  CN II through XII intact, motor grossly intact  Lab Results:  Basename 01/06/11 0322 01/05/11 0915  WBC 7.8 6.9  HGB 12.4 13.7  PLT 231 253    Basename 01/06/11 0322 01/05/11 0915  NA 142 141  K 4.1 4.4  CL 106 105  CO2 25 26  GLUCOSE 110* 106*  BUN 16 18  CREATININE 0.71 0.70    Basename 01/06/11 0314 01/05/11 1953  TROPONINI 0.35* 0.61*   Hepatic Function Panel  Basename 01/05/11 0915  PROT 7.8  ALBUMIN 3.8  AST 13  ALT 14  ALKPHOS 114  BILITOT 0.2*  BILIDIR --  IBILI --    Basename 01/06/11 0322  CHOL 212*   No results found for this basename: PROTIME in the last 72 hours  Imaging: cxr - reviewed  Cardiac Studies: Tele - NSR Assessment/Plan:  1. Atypical chest pain - her symptoms are controlled. Her enzymes are negative and ecg is markedly abnormal. I have recommended left heart catheterization. Will schedule as soon as possible.  2. HTN - she denies a h/o HTN. Her blood pressure is improved.  She will require outpatient medical therapy.  LOS: 1 day    Lewayne Bunting 01/06/2011, 10:50 AM

## 2011-01-06 NOTE — Progress Notes (Signed)
  Echocardiogram 2D Echocardiogram has been performed.  Megan Atkinson 01/06/2011, 4:34 PM 

## 2011-01-06 NOTE — Progress Notes (Signed)
Patient ID: Megan Atkinson, female   DOB: 12/16/1950, 60 y.o.   MRN: [id number] I have reviewed her findings.  She was scheduled for cardiac cath by Dr. Taylor.  Have discussed the risks and benefits with her, and explained the indications to her and her family.  I have answered all questions, and reviewed her labs.  Therefore, we will proceed with diagnostic cath and evaluate findings.  She is agreeable.   Sanja Elizardo 01/06/2011 3:09 PM  

## 2011-01-06 NOTE — Consult Note (Signed)
ANTICOAGULATION CONSULT NOTE - Initial Consult  Pharmacy Consult for Heparin Indication: Chest Pain No Known Allergies  Patient Measurements: Height: 5\' 7"  (170.2 cm) Weight: 313 lb 0.9 oz (142 kg) IBW/kg (Calculated) : 61.6    Vital Signs: Temp: 98.9 F (37.2 C) (11/29 2123) Temp src: Oral (11/29 2123) BP: 120/82 mmHg (11/29 2123) Pulse Rate: 80  (11/29 2123)  Labs:  Basename 01/06/11 0322 01/06/11 0314 01/05/11 2021 01/05/11 1953 01/05/11 0915  HGB 12.4 -- -- -- 13.7  HCT 38.3 -- -- -- 42.2  PLT 231 -- -- -- 253  APTT -- -- -- -- 33  LABPROT -- -- -- -- 12.6  INR -- -- -- -- 0.92  HEPARINUNFRC -- 0.18* 0.23* -- --  CREATININE 0.71 -- -- -- 0.70  CKTOTAL -- 70 -- 76 76  CKMB -- 3.1 -- 3.9 3.0  TROPONINI -- 0.35* -- 0.61* <0.30   Estimated Creatinine Clearance: 108 ml/min (by C-G formula based on Cr of 0.71).  Medical History: Past Medical History  Diagnosis Date  . Obesity   . Hyperlipidemia     Medications: Prescriptions prior to admission  Medication Sig Dispense Refill  . aspirin 325 MG tablet Take 325 mg by mouth daily.        . Aspirin-Phytosterols (BAYER HEART ADVANTAGE) 81-400 MG TABS Take 1 tablet by mouth daily.        . Ginkgo 60 MG TABS Take 2 tablets by mouth daily. For memory       . glucosamine-chondroitin 500-400 MG tablet Take 1 tablet by mouth daily.        Chilton Si Tea, Camillia sinensis, 1000 MG TABS Take 1 tablet by mouth daily.       . Misc Natural Products (7-KETO LEAN) CAPS Take 1 tablet by mouth 2 (two) times daily. For menopause symptons       . Misc Natural Products (PRO HERBS RELAX/EASE TENSION PO) Take by mouth. ashwagandah tab         Assessment: 60 yo female with chest pain for Heparin.  Heparin level subtherapeutic.  Goal of Therapy:  Heparin level 0.3-0.7 units/ml   Plan:  Heparin 3000 units bolus, increase 1600 units/hr  Trenae Brunke, Gary Fleet, [address].D. 01/06/2011 4:53 AM

## 2011-01-06 NOTE — Progress Notes (Signed)
ANTICOAGULATION CONSULT NOTE - Follow Up Consult  Pharmacy Consult for Heparin Indication: chest pain/ACS  No Known Allergies   Vital Signs: Temp: 98.5 F (36.9 C) (11/30 0649) Temp src: Oral (11/30 0649) BP: 149/91 mmHg (11/30 1014) Pulse Rate: 85  (11/30 1014)  Labs:  Basename 01/06/11 1045 01/06/11 1040 01/06/11 0322 01/06/11 0314 01/05/11 2021 01/05/11 1953 01/05/11 0915  HGB -- 13.3 12.4 -- -- -- --  HCT -- 40.9 38.3 -- -- -- 42.2  PLT -- 235 231 -- -- -- 253  APTT -- -- -- -- -- -- 33  LABPROT -- -- -- -- -- -- 12.6  INR -- -- -- -- -- -- 0.92  HEPARINUNFRC -- 0.43 -- 0.18* 0.23* -- --  CREATININE -- -- 0.71 -- -- -- 0.70  CKTOTAL 72 -- -- 70 -- 76 --  CKMB 2.8 -- -- 3.1 -- 3.9 --  TROPONINI <0.30 -- -- 0.35* -- 0.61* --   Estimated Creatinine Clearance: 108 ml/min (by C-G formula based on Cr of 0.71).   Medications:  Scheduled:    . aspirin  324 mg Oral NOW  . aspirin EC  81 mg Oral Daily  . heparin      . heparin  3,000 Units Intravenous Once  . heparin  4,000 Units Intravenous Once  . hydrALAZINE  10 mg Intravenous Once  . metoprolol tartrate  25 mg Oral Once  . metoprolol tartrate  25 mg Oral BID  . regadenoson  0.4 mg Intravenous Once  . rosuvastatin  20 mg Oral Daily  . sodium chloride  3 mL Intravenous Q12H  . sodium chloride  3 mL Intravenous Q12H  . DISCONTD: heparin  1,000 Units/hr Intravenous Once  . DISCONTD: heparin  1,000 Units/hr Intravenous Once    Assessment: 60 year old with CP.  Heparin level is therapeutic.  Planning to cath in the future.  Goal of Therapy:  Heparin level 0.3-0.7 units/ml   Plan:  1) Continue heparin at 1600 units / hr 2) Follow up AM heparin level  Elwin Sleight 01/06/2011,1:10 PM

## 2011-01-06 NOTE — Interval H&P Note (Signed)
History and Physical Interval Note:  01/06/2011 3:49 PM  Megan Atkinson  has presented today for surgery, with the diagnosis of chest pain  The various methods of treatment have been discussed with the patient and family. After consideration of risks, benefits and other options for treatment, the patient has consented to  Procedure(s): LEFT HEART CATHETERIZATION WITH CORONARY ANGIOGRAM as a surgical intervention .  The patients' history has been reviewed, patient examined, no change in status, stable for surgery.  I have reviewed the patients' chart and labs.  Questions were answered to the patient's satisfaction.     Shawnie Pons

## 2011-01-07 DIAGNOSIS — I2510 Atherosclerotic heart disease of native coronary artery without angina pectoris: Secondary | ICD-10-CM

## 2011-01-07 DIAGNOSIS — I10 Essential (primary) hypertension: Secondary | ICD-10-CM

## 2011-01-07 DIAGNOSIS — Z01810 Encounter for preprocedural cardiovascular examination: Secondary | ICD-10-CM

## 2011-01-07 LAB — CBC
Hemoglobin: 12.4 g/dL (ref 12.0–15.0)
MCH: 30.3 pg (ref 26.0–34.0)
MCHC: 32.5 g/dL (ref 30.0–36.0)
MCV: 93.2 fL (ref 78.0–100.0)
RBC: 4.09 MIL/uL (ref 3.87–5.11)

## 2011-01-07 MED ORDER — AMLODIPINE BESYLATE 5 MG PO TABS
5.0000 mg | ORAL_TABLET | Freq: Every day | ORAL | Status: DC
  Administered 2011-01-07 – 2011-01-09 (×3): 5 mg via ORAL
  Filled 2011-01-07 (×3): qty 1

## 2011-01-07 NOTE — Progress Notes (Signed)
ANTICOAGULATION CONSULT NOTE - Follow Up Consult  Pharmacy Consult for Heparin Indication: 3V CAD  No Known Allergies   Vital Signs: Temp: 98.6 F (37 C) (12/01 0730) Temp src: Oral (12/01 0730) BP: 159/72 mmHg (12/01 0730) Pulse Rate: 77  (12/01 0730)  Labs:  Basename 01/07/11 0630 01/06/11 1045 01/06/11 1040 01/06/11 0322 01/06/11 0314 01/05/11 1953 01/05/11 0915  HGB 12.4 -- 13.3 -- -- -- --  HCT 38.1 -- 40.9 38.3 -- -- --  PLT 249 -- 235 231 -- -- --  APTT -- -- -- -- -- -- 33  LABPROT -- -- -- -- -- -- 12.6  INR -- -- -- -- -- -- 0.92  HEPARINUNFRC 0.42 -- 0.43 -- 0.18* -- --  CREATININE -- -- -- 0.71 -- -- 0.70  CKTOTAL -- 72 -- -- 70 76 --  CKMB -- 2.8 -- -- 3.1 3.9 --  TROPONINI -- <0.30 -- -- 0.35* 0.61* --   Estimated Creatinine Clearance: 111.2 ml/min (by C-G formula based on Cr of 0.71).   Medications:  Scheduled:     . amLODipine  5 mg Oral Daily  . aspirin EC  81 mg Oral Daily  . fentaNYL      . heparin      . heparin      . labetalol      . labetalol  10 mg Intravenous Once  . lidocaine      . metoprolol tartrate  25 mg Oral BID  . midazolam      . midazolam      . nitroGLYCERIN      . rosuvastatin  20 mg Oral Daily  . verapamil      . DISCONTD: aspirin  324 mg Oral NOW  . DISCONTD: regadenoson  0.4 mg Intravenous Once  . DISCONTD: sodium chloride  3 mL Intravenous Q12H  . DISCONTD: sodium chloride  3 mL Intravenous Q12H    Assessment: 60 yo M to resume heparin s/p cath 8 hrs after sheath removal for 3V CAD.   Heparin level this morning = 0.42 units/ml.  No bleeding complications noted.  Goal of Therapy:  Heparin level 0.3 - 0.5  Plan:  1. Continue IV heparin at 1600 units/hr (16 ml/hr)  2.  Heparin level and CBC in AM  Nadara Mustard, PharmD MS 01/07/2011,2:13 PM

## 2011-01-07 NOTE — Progress Notes (Signed)
TR BAND REMOVAL  LOCATION:    right radial  DEFLATED PER PROTOCOL:    yes  TIME BAND OFF / DRESSING APPLIED:    2110   SITE UPON ARRIVAL:    Level 0 (per change of shift report from [place], RN)  SITE AFTER BAND REMOVAL:    Level 0  REVERSE ALLEN'S TEST:     positive  CIRCULATION SENSATION AND MOVEMENT:    Within Normal Limits   yes  COMMENTS:

## 2011-01-07 NOTE — Progress Notes (Signed)
TCTS BRIEF PROGRESS NOTE   Clinically stable.  No chest pain.  Exam unchanged. Mrs. Corney has decided she wants to have surgery. OR schedule is already full for Monday and I will be out of town the rest of next week. One of my partners will follow up on Monday once final schedule can be arranged.  OWEN,CLARENCE H 01/07/2011 2:22 PM

## 2011-01-07 NOTE — Progress Notes (Signed)
Patient ID: Megan Atkinson, female   DOB: 1951/01/18, 60 y.o.   MRN: [id number] SUBJECTIVE:   Patient is doing very well post catheterization.  The right radial site is completely stable.  There has been careful discussion and reviewing the chart by Dr. Riley Kill and Cornelius Moras.  The patient is carefully considered her options.  She wants to proceed with surgery.  I will await the potential timing from the surgical team.  Filed Vitals:   01/06/11 2208 01/07/11 0000 01/07/11 0626 01/07/11 0730  BP: 121/56 131/64 151/65 159/72  Pulse: 76 77 77 77  Temp:  98.6 F (37 C) 98.8 F (37.1 C) 98.6 F (37 C)  TempSrc:  Oral Oral Oral  Resp:  23 20 21   Height:      Weight:   328 lb 7.8 oz (149 kg)   SpO2:  96% 96% 98%    Intake/Output Summary (Last 24 hours) at 01/07/11 0845 Last data filed at 01/07/11 0616  Gross per 24 hour  Intake 1543.33 ml  Output    750 ml  Net 793.33 ml    LABS: Basic Metabolic Panel:  Basename 01/06/11 0322 01/05/11 0915  NA 142 141  K 4.1 4.4  CL 106 105  CO2 25 26  GLUCOSE 110* 106*  BUN 16 18  CREATININE 0.71 0.70  CALCIUM 8.8 9.5  MG -- --  PHOS -- --   Liver Function Tests:  [facility] 01/05/11 0915  AST 13  ALT 14  ALKPHOS 114  BILITOT 0.2*  PROT 7.8  ALBUMIN 3.8   No results found for this basename: LIPASE:2,AMYLASE:2 in the last 72 hours CBC:  Basename 01/07/11 0630 01/06/11 1040 01/05/11 0915  WBC 7.7 7.7 --  NEUTROABS -- -- 4.8  HGB 12.4 13.3 --  HCT 38.1 40.9 --  MCV 93.2 93.4 --  PLT 249 235 --   Cardiac Enzymes:  Basename 01/06/11 1045 01/06/11 0314 01/05/11 1953  CKTOTAL 72 70 76  CKMB 2.8 3.1 3.9  CKMBINDEX -- -- --  TROPONINI <0.30 0.35* 0.61*   BNP: No results found for this basename: POCBNP:3 in the last 72 hours D-Dimer:  [facility] 01/05/11 2021  DDIMER 0.73*   Hemoglobin A1C:  Basename 01/05/11 2021  HGBA1C 6.4*   Fasting Lipid Panel:  Basename 01/06/11 0322  CHOL 212*  HDL 46  LDLCALC 143*  TRIG 113    CHOLHDL 4.6  LDLDIRECT --   Thyroid Function Tests:  Basename 01/05/11 2021  TSH 2.187  T4TOTAL --  T3FREE --  THYROIDAB --    RADIOLOGY: Dg Chest Port 1 View  01/05/2011  *RADIOLOGY REPORT*  Clinical Data: Chest pain radiating to back.  Vomiting.  PORTABLE CHEST - 1 VIEW  Comparison: None.  Findings: Low lung volumes are seen however both lungs are clear. Heart size is normal.  No evidence of pleural effusion.  IMPRESSION: Low lung volumes.  No active disease.  Original Report Authenticated By: Danae Orleans, M.D.    PHYSICAL EXAM  Patient is stable.  There is no jugular venous distention.  Lungs are clear.  Respiratory effort is not labored.  Cardiac exam reveals an S1 and S2.  There are no clicks or significant murmurs.  The abdomen is soft.  There is no peripheral edema.  The right radial catheter site is nicely healing.   TELEMETRY:     I have reviewed telemetry.  There is normal sinus rhythm.   ASSESSMENT AND PLAN:  Active Problems:   CAD (coronary artery  disease)    The patient is stable.  She has decided she wants to proceed with bypass surgery.  I will await followup from the surgical team concerning the timing.  She will remain in the hospital until this is done.   Hypertension   Blood pressure remains elevated.  Meds will be adjusted.    [name] 01/07/2011 8:45 AM

## 2011-01-07 NOTE — Progress Notes (Signed)
Precabg carotid duplex dopplers completed. No obvious evidence of ICA stenosis bilaterally. Vertebral flow antegrade bilaterally.  Megan Atkinson 01/07/2011, 4:17 PM

## 2011-01-08 ENCOUNTER — Inpatient Hospital Stay (HOSPITAL_COMMUNITY): Payer: No Typology Code available for payment source

## 2011-01-08 DIAGNOSIS — Z01810 Encounter for preprocedural cardiovascular examination: Secondary | ICD-10-CM

## 2011-01-08 LAB — CBC
HCT: 39.4 % (ref 36.0–46.0)
Hemoglobin: 12.6 g/dL (ref 12.0–15.0)
MCH: 29.9 pg (ref 26.0–34.0)
MCHC: 32 g/dL (ref 30.0–36.0)
MCV: 93.6 fL (ref 78.0–100.0)
Platelets: 228 10*3/uL (ref 150–400)
RBC: 4.21 MIL/uL (ref 3.87–5.11)
RDW: 14.2 % (ref 11.5–15.5)
WBC: 7.4 10*3/uL (ref 4.0–10.5)

## 2011-01-08 LAB — HEPARIN LEVEL (UNFRACTIONATED)
Heparin Unfractionated: 0.82 IU/mL — ABNORMAL HIGH (ref 0.30–0.70)
Heparin Unfractionated: 0.59 IU/mL (ref 0.30–0.70)

## 2011-01-08 NOTE — Progress Notes (Signed)
Subjective: Patient denies CP or SOB. Objective: Filed Vitals:   01/07/11 1300 01/07/11 1433 01/07/11 2148 01/08/11 0500  BP:  161/96 150/81 142/80  Pulse:  71 75 75  Temp:  98.2 F (36.8 C) 98.6 F (37 C) 98.9 F (37.2 C)  TempSrc:  Oral Oral Oral  Resp:  20 20 18   Height: 5\' 7"  (1.702 m)     Weight: 328 lb 7.8 oz (149 kg)     SpO2:  94% 96% 96%   Weight change: 0 lb (0 kg)  Intake/Output Summary (Last 24 hours) at 01/08/11 1014 Last data filed at 01/08/11 0700  Gross per 24 hour  Intake    980 ml  Output      0 ml  Net    980 ml    General: Alert, awake, oriented x3, in no acute distress Heart: Regular rate and rhythm, without murmurs, rubs, gallops.  Lungs: Rel clear Ext:  No edema   Lab Results: Results for orders placed during the hospital encounter of 01/05/11 (from the past 24 hour(s))  HEPARIN LEVEL (UNFRACTIONATED)     Status: Abnormal   Collection Time   01/08/11  6:30 AM      Component Value Range   Heparin Unfractionated 0.82 (*) 0.30 - 0.70 (IU/mL)  CBC     Status: Normal   Collection Time   01/08/11  6:30 AM      Component Value Range   WBC 7.4  4.0 - 10.5 (K/uL)   RBC 4.21  3.87 - 5.11 (MIL/uL)   Hemoglobin 12.6  12.0 - 15.0 (g/dL)   HCT 16.1  09.6 - 04.5 (%)   MCV 93.6  78.0 - 100.0 (fL)   MCH 29.9  26.0 - 34.0 (pg)   MCHC 32.0  30.0 - 36.0 (g/dL)   RDW 40.9  81.1 - 91.4 (%)   Platelets 228  150 - 400 (K/uL)    Studies/Results: Dg Chest 2 View  01/08/2011  *RADIOLOGY REPORT*  Clinical Data: Preop.  CHEST - 2 VIEW  Comparison: 01/05/2011  Findings: Mild diffuse peribronchial thickening.  Heart is upper limits normal in size.  No confluent opacities or effusions.  No acute bony abnormality.  IMPRESSION: Mild chronic bronchitic changes.  Original Report Authenticated By: Cyndie Chime, M.D.    Medications: I have reviewed the patient's current medications.   Patient Active Hospital Problem List: CAD (coronary artery disease) (01/07/2011)  Assessment: Patient without symptoms.  Await CABG   Plan: Hypertension (01/07/2011)   Assessment:  Follow for now.   Plan:    LOS: 3 days   Dietrich Pates 01/08/2011, 10:14 AM

## 2011-01-08 NOTE — Progress Notes (Signed)
Subjective: No CP or SOB> Objective: Filed Vitals:   01/07/11 1300 01/07/11 1433 01/07/11 2148 01/08/11 0500  BP:  161/96 150/81 142/80  Pulse:  71 75 75  Temp:  98.2 F (36.8 C) 98.6 F (37 C) 98.9 F (37.2 C)  TempSrc:  Oral Oral Oral  Resp:  20 20 18   Height: 5\' 7"  (1.702 m)     Weight: 328 lb 7.8 oz (149 kg)     SpO2:  94% 96% 96%   Weight change: 0 lb (0 kg)  Intake/Output Summary (Last 24 hours) at 01/08/11 1021 Last data filed at 01/08/11 0700  Gross per 24 hour  Intake    980 ml  Output      0 ml  Net    980 ml    General: Alert, awake, oriented x3, in no acute distress.  HEENT: No bruits, no goiter.  Heart: Regular rate and rhythm, without murmurs, rubs, gallops.  Lungs: CTA  Ext:  No edema  Lab Results: Results for orders placed during the hospital encounter of 01/05/11 (from the past 24 hour(s))  HEPARIN LEVEL (UNFRACTIONATED)     Status: Abnormal   Collection Time   01/08/11  6:30 AM      Component Value Range   Heparin Unfractionated 0.82 (*) 0.30 - 0.70 (IU/mL)  CBC     Status: Normal   Collection Time   01/08/11  6:30 AM      Component Value Range   WBC 7.4  4.0 - 10.5 (K/uL)   RBC 4.21  3.87 - 5.11 (MIL/uL)   Hemoglobin 12.6  12.0 - 15.0 (g/dL)   HCT 16.1  09.6 - 04.5 (%)   MCV 93.6  78.0 - 100.0 (fL)   MCH 29.9  26.0 - 34.0 (pg)   MCHC 32.0  30.0 - 36.0 (g/dL)   RDW 40.9  81.1 - 91.4 (%)   Platelets 228  150 - 400 (K/uL)    Studies/Results: Dg Chest 2 View  01/08/2011  *RADIOLOGY REPORT*  Clinical Data: Preop.  CHEST - 2 VIEW  Comparison: 01/05/2011  Findings: Mild diffuse peribronchial thickening.  Heart is upper limits normal in size.  No confluent opacities or effusions.  No acute bony abnormality.  IMPRESSION: Mild chronic bronchitic changes.  Original Report Authenticated By: Cyndie Chime, M.D.    Medications: I have reviewed the patient's current medications.   Patient Active Hospital Problem List: CAD (coronary artery disease)  (01/07/2011)   Assessment: Denies CP  Breathing OK   Plan: Await word for cardiac surgery.  Plan for CABG Hypertension (01/07/2011)   Assessment: BP a ltittle better this am   Plan: Continue current regimen.   LOS: 3 days   Dietrich Pates 01/08/2011, 10:21 AM

## 2011-01-08 NOTE — Progress Notes (Signed)
Pre CABG Dopplers completed at 08:30. Smiley Houseman 01/08/2011, 9:27 AM

## 2011-01-08 NOTE — Progress Notes (Signed)
ANTICOAGULATION CONSULT NOTE - Follow Up Consult  Pharmacy Consult for Heparin Indication: chest pain/ACS/ 3V CAD  No Known Allergies  Patient Measurements: Height: 5\' 7"  (170.2 cm) Weight: 328 lb 7.8 oz (149 kg) IBW/kg (Calculated) : 61.6    Vital Signs: Temp: 98.9 F (37.2 C) (12/02 0500) Temp src: Oral (12/02 0500) BP: 142/80 mmHg (12/02 0500) Pulse Rate: 75  (12/02 0500)  Labs:  Basename 01/08/11 0630 01/07/11 0630 01/06/11 1045 01/06/11 1040 01/06/11 0322 01/06/11 0314 01/05/11 1953  HGB 12.6 12.4 -- -- -- -- --  HCT 39.4 38.1 -- 40.9 -- -- --  PLT 228 249 -- 235 -- -- --  APTT -- -- -- -- -- -- --  LABPROT -- -- -- -- -- -- --  INR -- -- -- -- -- -- --  HEPARINUNFRC 0.82* 0.42 -- 0.43 -- -- --  CREATININE -- -- -- -- 0.71 -- --  CKTOTAL -- -- 72 -- -- 70 76  CKMB -- -- 2.8 -- -- 3.1 3.9  TROPONINI -- -- <0.30 -- -- 0.35* 0.61*   Estimated Creatinine Clearance: 111.2 ml/min (by C-G formula based on Cr of 0.71).   Medications:  Scheduled:    . amLODipine  5 mg Oral Daily  . aspirin EC  81 mg Oral Daily  . metoprolol tartrate  25 mg Oral BID  . rosuvastatin  20 mg Oral Daily    Assessment: 60 y/o female patient admitted with chest pain, s/p cath with severe 3V CAD receiving heparin. Heparin level supratherapeutic, will decrecrease rate. No bleeding reported.  Goal of Therapy:  Xa=0.3-0.5   Plan:  Decrease heparin to 1300 unit/hr and check 6 hour heparin level.  Verlene Mayer, PharmD, BCPS Pager [phone]  01/08/2011,11:41 AM

## 2011-01-08 NOTE — Progress Notes (Signed)
ANTICOAGULATION CONSULT NOTE - Follow Up Consult  Pharmacy Consult for Heparin Indication: 3VCAD awaiting CABG  No Known Allergies  Patient Measurements: Height: 5\' 7"  (170.2 cm) Weight: 328 lb 7.8 oz (149 kg) IBW/kg (Calculated) : 61.6    Vital Signs: Temp: 97.9 F (36.6 C) (12/02 1451) Temp src: Oral (12/02 1451) BP: 161/84 mmHg (12/02 1451) Pulse Rate: 71  (12/02 1451)  Labs:  Basename 01/08/11 1811 01/08/11 0630 01/07/11 0630 01/06/11 1045 01/06/11 1040 01/06/11 0322 01/06/11 0314  HGB -- 12.6 12.4 -- -- -- --  HCT -- 39.4 38.1 -- 40.9 -- --  PLT -- 228 249 -- 235 -- --  APTT -- -- -- -- -- -- --  LABPROT -- -- -- -- -- -- --  INR -- -- -- -- -- -- --  HEPARINUNFRC 0.59 0.82* 0.42 -- -- -- --  CREATININE -- -- -- -- -- 0.71 --  CKTOTAL -- -- -- 72 -- -- 70  CKMB -- -- -- 2.8 -- -- 3.1  TROPONINI -- -- -- <0.30 -- -- 0.35*   Estimated Creatinine Clearance: 111.2 ml/min (by C-G formula based on Cr of 0.71).   Medications:  Scheduled:     . amLODipine  5 mg Oral Daily  . aspirin EC  81 mg Oral Daily  . metoprolol tartrate  25 mg Oral BID  . rosuvastatin  20 mg Oral Daily    Assessment: [name] on heparin for admitted with CP and is s/p cath--found to have severe 3V CAD now awaiting CABG. Heparin level slightly SUPRAtherapeutic. No bleeding reported  Goal of Therapy:  Heparin level 0.3-0.5 (per MD)   Plan:  1. Decrease heparin drip rate to 1200 units/hr (12 ml/hr) 2. Will continue to monitor for any signs/symptoms of bleeding and will follow up with heparin level in the a.m.   Georgina Pillion, PharmD, BCPS Pager: [phone] 01/08/2011,9:14 PM

## 2011-01-09 ENCOUNTER — Inpatient Hospital Stay (HOSPITAL_COMMUNITY): Payer: No Typology Code available for payment source

## 2011-01-09 ENCOUNTER — Other Ambulatory Visit: Payer: Self-pay

## 2011-01-09 DIAGNOSIS — I2510 Atherosclerotic heart disease of native coronary artery without angina pectoris: Secondary | ICD-10-CM

## 2011-01-09 DIAGNOSIS — I10 Essential (primary) hypertension: Secondary | ICD-10-CM

## 2011-01-09 LAB — CBC
Hemoglobin: 12.3 g/dL (ref 12.0–15.0)
MCHC: 32.3 g/dL (ref 30.0–36.0)
RBC: 4.08 MIL/uL (ref 3.87–5.11)
WBC: 7.8 10*3/uL (ref 4.0–10.5)

## 2011-01-09 LAB — HEPARIN LEVEL (UNFRACTIONATED)
Heparin Unfractionated: 0.28 IU/mL — ABNORMAL LOW (ref 0.30–0.70)
Heparin Unfractionated: 0.42 IU/mL (ref 0.30–0.70)

## 2011-01-09 MED ORDER — AMLODIPINE BESYLATE 10 MG PO TABS
10.0000 mg | ORAL_TABLET | Freq: Every day | ORAL | Status: DC
  Administered 2011-01-10 – 2011-01-11 (×2): 10 mg via ORAL
  Filled 2011-01-09 (×3): qty 1

## 2011-01-09 NOTE — Progress Notes (Signed)
ANTICOAGULATION CONSULT NOTE - Follow Up Consult  Pharmacy Consult for Heparin Indication: 3VCAD awaiting CABG  No Known Allergies  Patient Measurements: Height: 5\' 7"  (170.2 cm) Weight: 328 lb 7.8 oz (149 kg) IBW/kg (Calculated) : 61.6  Heparin dosing weight: 99 kg   Vital Signs: Temp: 98.1 F (36.7 C) (12/03 0500) BP: 148/85 mmHg (12/03 0500) Pulse Rate: 66  (12/03 0500)  Labs:  Basename 01/09/11 0630 01/08/11 1811 01/08/11 0630 01/07/11 0630 01/06/11 1045  HGB 12.3 -- 12.6 -- --  HCT 38.1 -- 39.4 38.1 --  PLT 224 -- 228 249 --  APTT -- -- -- -- --  LABPROT -- -- -- -- --  INR -- -- -- -- --  HEPARINUNFRC 0.28* 0.59 0.82* -- --  CREATININE -- -- -- -- --  CKTOTAL -- -- -- -- 72  CKMB -- -- -- -- 2.8  TROPONINI -- -- -- -- <0.30   Estimated Creatinine Clearance: 111.2 ml/min (by C-G formula based on Cr of 0.71).   Medications:  Scheduled:     . amLODipine  5 mg Oral Daily  . aspirin EC  81 mg Oral Daily  . metoprolol tartrate  25 mg Oral BID  . rosuvastatin  20 mg Oral Daily    Assessment: [name] on heparin for admitted with CP and is s/p cath--found to have severe 3V CAD now awaiting CABG. Heparin level slightly SUBtherapeutic. No bleeding reported.   Goal of Therapy:  Heparin level 0.3-0.5 (per MD)   Plan:  1. Increase heparin drip rate to 1300 units/hr (13 ml/hr) 2. Will continue to monitor for any signs/symptoms of bleeding and will follow up with 6 hr heparin level   Christoper Fabian, PharmD, BCPS Pager: [phone] 01/09/2011,8:58 AM

## 2011-01-09 NOTE — Progress Notes (Signed)
ANTICOAGULATION CONSULT NOTE - Follow Up Consult  Pharmacy Consult for Heparin Indication: 3VCAD awaiting CABG  No Known Allergies  Patient Measurements: Height: 5\' 7"  (170.2 cm) Weight: 328 lb 7.8 oz (149 kg) IBW/kg (Calculated) : 61.6  Heparin dosing weight: 99 kg   Vital Signs: Temp: 99 F (37.2 C) (12/03 1400) Temp src: Oral (12/03 1400) BP: 127/81 mmHg (12/03 1400) Pulse Rate: 77  (12/03 1400)  Labs:  Basename 01/09/11 1538 01/09/11 0630 01/08/11 1811 01/08/11 0630 01/07/11 0630  HGB -- 12.3 -- 12.6 --  HCT -- 38.1 -- 39.4 38.1  PLT -- 224 -- 228 249  APTT -- -- -- -- --  LABPROT -- -- -- -- --  INR -- -- -- -- --  HEPARINUNFRC 0.42 0.28* 0.59 -- --  CREATININE -- -- -- -- --  CKTOTAL -- -- -- -- --  CKMB -- -- -- -- --  TROPONINI -- -- -- -- --   Estimated Creatinine Clearance: 111.2 ml/min (by C-G formula based on Cr of 0.71).    Assessment: [name] on heparin for admitted with CP and is s/p cath--found to have severe 3V CAD now awaiting CABG. Heparin level = 0.42 after rate increased to 1300 units/hr. This is therapeutic.. No bleeding reported.   Goal of Therapy:  Heparin level 0.3-0.5 (per MD)   Plan:  1.continue heparin drip rate 1300 units/hr (13 ml/hr) 2. Will continue to monitor for any signs/symptoms of bleeding and will follow up daily heparin level and CBC  Len Childs T 01/09/2011 Pager: [phone]

## 2011-01-09 NOTE — Progress Notes (Signed)
@   Subjective:  Denies CP or dyspnea; complains of left lower abdominal pain with standing   Objective:  Filed Vitals:   01/08/11 1451 01/08/11 2200 01/09/11 0500 01/09/11 1027  BP: 161/84 144/85 148/85 163/83  Pulse: 71 76 66 71  Temp: 97.9 F (36.6 C) 98.2 F (36.8 C) 98.1 F (36.7 C)   TempSrc: Oral     Resp: 20 20 18    Height:      Weight:      SpO2: 99% 96% 95%     Intake/Output from previous day:  Intake/Output Summary (Last 24 hours) at 01/09/11 1052 Last data filed at 01/09/11 0000  Gross per 24 hour  Intake 589.93 ml  Output      0 ml  Net 589.93 ml    Physical Exam: Physical exam: Well-developed well-nourished in no acute distress.  Skin is warm and dry.  HEENT is normal.  Neck is supple. No thyromegaly.  Chest is clear to auscultation with normal expansion.  Cardiovascular exam is regular rate and rhythm.  Abdominal exam not distended. No masses palpated. Mild tenderness left lower abdomen, no rebound Extremities show no edema. neuro grossly intact    Lab Results: CBC:  Basename 01/09/11 0630 01/08/11 0630  WBC 7.8 7.4  NEUTROABS -- --  HGB 12.3 12.6  HCT 38.1 39.4  MCV 93.4 93.6  PLT 224 228     Assessment/Plan:  CAD - Continue Asa, heparin, lopressor and heparin; CABG - timing per CVTS; recheck H/H in Am but no evidence of bleeding causing abdominal pain (hgb stable this AM) Hypertension - increase amlodipine to 10 mg daily Hyperlipidemia - continue statin.  Olga Millers 01/09/2011, 10:52 AM

## 2011-01-10 ENCOUNTER — Encounter (HOSPITAL_COMMUNITY): Payer: Self-pay | Admitting: *Deleted

## 2011-01-10 ENCOUNTER — Encounter (HOSPITAL_COMMUNITY): Payer: 59

## 2011-01-10 DIAGNOSIS — R079 Chest pain, unspecified: Secondary | ICD-10-CM

## 2011-01-10 LAB — BASIC METABOLIC PANEL
GFR calc Af Amer: >90 mL/min (ref >90)
GFR calc non Af Amer: >90 mL/min (ref >90)
Glucose, Bld: 114 mg/dL — ABNORMAL HIGH (ref 70–99)
Potassium: 4.3 mEq/L (ref 3.5–5.1)
Sodium: 141 mEq/L (ref 135–145)

## 2011-01-10 LAB — CBC
Hemoglobin: 11.7 g/dL — ABNORMAL LOW (ref 12.0–15.0)
MCHC: 31.7 g/dL (ref 30.0–36.0)
WBC: 10.1 10*3/uL (ref 4.0–10.5)

## 2011-01-10 LAB — HEPARIN LEVEL (UNFRACTIONATED): Heparin Unfractionated: 0.55 IU/mL (ref 0.30–0.70)

## 2011-01-10 NOTE — Progress Notes (Signed)
@   Subjective:  Denies CP or dyspnea; complains of mid lower abdominal pain with standing but improved compared to previous   Objective:  Filed Vitals:   01/09/11 1027 01/09/11 1400 01/09/11 2200 01/10/11 0500  BP: 163/83 127/81 126/81 154/62  Pulse: 71 77 79 71  Temp:  99 F (37.2 C) 98.6 F (37 C) 98.6 F (37 C)  TempSrc:  Oral    Resp:  20 18 18   Height:      Weight:      SpO2:  96% 98% 94%    Intake/Output from previous day:  Intake/Output Summary (Last 24 hours) at 01/10/11 0854 Last data filed at 01/09/11 1700  Gross per 24 hour  Intake    300 ml  Output      0 ml  Net    300 ml    Physical Exam: Physical exam: Well-developed well-nourished in no acute distress.  Skin is warm and dry.  HEENT is normal.  Neck is supple. No thyromegaly.  Chest is clear to auscultation with normal expansion.  Cardiovascular exam is regular rate and rhythm.  Abdominal exam not distended. No masses palpated. Mild tenderness mid lower abdomen, no rebound Extremities show no edema. neuro grossly intact    Lab Results: CBC:  Basename 01/10/11 0612 01/09/11 0630  WBC 10.1 7.8  NEUTROABS -- --  HGB 11.7* 12.3  HCT 36.9 38.1  MCV 93.4 93.4  PLT 238 224     Assessment/Plan:  CAD - Continue Asa, heparin, lopressor and heparin; CABG - timing per CVTS (will contact again today); recheck H/H in Am but no evidence of bleeding causing abdominal pain (hgb remains stable this AM) Hypertension - increase amlodipine to 10 mg daily Hyperlipidemia - continue statin.  Megan Atkinson 01/10/2011, 8:54 AM

## 2011-01-10 NOTE — Progress Notes (Signed)
ANTICOAGULATION CONSULT NOTE - Follow Up Consult  Pharmacy Consult for Heparin Indication: 3VCAD awaiting CABG  No Known Allergies  Patient Measurements: Height: 5\' 7"  (170.2 cm) Weight: 328 lb 7.8 oz (149 kg) IBW/kg (Calculated) : 61.6  Heparin dosing weight: 99 kg   Vital Signs: Temp: 98.6 F (37 C) (12/04 0500) BP: 154/62 mmHg (12/04 0500) Pulse Rate: 71  (12/04 0500)  Labs:  Basename 01/10/11 0612 01/09/11 1538 01/09/11 0630 01/08/11 0630  HGB 11.7* -- 12.3 --  HCT 36.9 -- 38.1 39.4  PLT 238 -- 224 228  APTT -- -- -- --  LABPROT -- -- -- --  INR -- -- -- --  HEPARINUNFRC 0.55 0.42 0.28* --  CREATININE 0.70 -- -- --  CKTOTAL -- -- -- --  CKMB -- -- -- --  TROPONINI -- -- -- --   Estimated Creatinine Clearance: 111.2 ml/min (by C-G formula based on Cr of 0.7).    Assessment: [name] on heparin for severe 3V CAD; awaiting CABG. Heparin level = 0.55 on 1300 units/hr. This is therapeutic but slightly higher than goal set per MD. No bleeding reported. CBC stable.  Goal of Therapy:  Heparin level 0.3-0.5 (per MD)   Plan:  1. Decrease heparin drip rate to 1250 units/hr (12.5 ml/hr) 2. Will continue to monitor for any signs/symptoms of bleeding and will follow up daily heparin level and CBC  Lavonia Atkinson 01/10/2011 Pager: [phone]

## 2011-01-11 ENCOUNTER — Encounter (HOSPITAL_COMMUNITY): Payer: Self-pay | Admitting: Anesthesiology

## 2011-01-11 DIAGNOSIS — I2510 Atherosclerotic heart disease of native coronary artery without angina pectoris: Secondary | ICD-10-CM

## 2011-01-11 LAB — CBC
MCV: 93 fL (ref 78.0–100.0)
Platelets: 225 10*3/uL (ref 150–400)
RBC: 3.72 MIL/uL — ABNORMAL LOW (ref 3.87–5.11)
WBC: 10.4 10*3/uL (ref 4.0–10.5)

## 2011-01-11 LAB — HEPARIN LEVEL (UNFRACTIONATED): Heparin Unfractionated: 0.4 IU/mL (ref 0.30–0.70)

## 2011-01-11 MED ORDER — POTASSIUM CHLORIDE 2 MEQ/ML IV SOLN
80.0000 meq | INTRAVENOUS | Status: DC
  Filled 2011-01-11: qty 40

## 2011-01-11 MED ORDER — SODIUM CHLORIDE 0.9 % IV SOLN
INTRAVENOUS | Status: AC
  Administered 2011-01-12: 1.3 [IU]/h via INTRAVENOUS
  Filled 2011-01-11: qty 1

## 2011-01-11 MED ORDER — DOPAMINE-DEXTROSE 3.2-5 MG/ML-% IV SOLN
2.0000 ug/kg/min | INTRAVENOUS | Status: DC
  Filled 2011-01-11: qty 250

## 2011-01-11 MED ORDER — CHLORHEXIDINE GLUCONATE 4 % EX LIQD: 60.0000 mL | Freq: Once | CUTANEOUS | Status: DC

## 2011-01-11 MED ORDER — ALPRAZOLAM 0.25 MG PO TABS: 0.2500 mg | ORAL_TABLET | ORAL | Status: DC | PRN

## 2011-01-11 MED ORDER — DEXTROSE 5 % IV SOLN
750.0000 mg | INTRAVENOUS | Status: DC
  Filled 2011-01-11 (×2): qty 750

## 2011-01-11 MED ORDER — NITROGLYCERIN IN D5W 200-5 MCG/ML-% IV SOLN
2.0000 ug/min | INTRAVENOUS | Status: AC
  Administered 2011-01-12: 5 ug/min via INTRAVENOUS
  Filled 2011-01-11: qty 250

## 2011-01-11 MED ORDER — DIAZEPAM 5 MG PO TABS
10.0000 mg | ORAL_TABLET | Freq: Once | ORAL | Status: AC
  Administered 2011-01-12: 10 mg via ORAL
  Filled 2011-01-11: qty 2

## 2011-01-11 MED ORDER — EPINEPHRINE HCL 1 MG/ML IJ SOLN
0.5000 ug/min | INTRAVENOUS | Status: DC
  Filled 2011-01-11: qty 4

## 2011-01-11 MED ORDER — PHENYLEPHRINE HCL 10 MG/ML IJ SOLN
30.0000 ug/min | INTRAVENOUS | Status: AC
  Administered 2011-01-12: 25 ug/min via INTRAVENOUS
  Filled 2011-01-11: qty 2

## 2011-01-11 MED ORDER — MAGNESIUM SULFATE 50 % IJ SOLN
40.0000 meq | INTRAMUSCULAR | Status: DC
  Filled 2011-01-11: qty 10

## 2011-01-11 MED ORDER — SODIUM CHLORIDE 0.9 % IV SOLN
0.1000 ug/kg/h | INTRAVENOUS | Status: AC
  Administered 2011-01-12: .2 ug/kg/h via INTRAVENOUS
  Filled 2011-01-11: qty 4

## 2011-01-11 MED ORDER — SODIUM CHLORIDE 0.9 % IV SOLN
INTRAVENOUS | Status: DC
  Filled 2011-01-11: qty 40

## 2011-01-11 MED ORDER — VANCOMYCIN HCL 1000 MG IV SOLR
1500.0000 mg | INTRAVENOUS | Status: AC
  Administered 2011-01-12: 1500 mg via INTRAVENOUS
  Filled 2011-01-11: qty 1500

## 2011-01-11 MED ORDER — METOPROLOL TARTRATE 12.5 MG HALF TABLET
12.5000 mg | ORAL_TABLET | Freq: Once | ORAL | Status: AC
  Administered 2011-01-12: 12.5 mg via ORAL
  Filled 2011-01-11 (×2): qty 1

## 2011-01-11 MED ORDER — CHLORHEXIDINE GLUCONATE 4 % EX LIQD
60.0000 mL | Freq: Once | CUTANEOUS | Status: AC
  Administered 2011-01-12: 4 via TOPICAL
  Filled 2011-01-11: qty 60

## 2011-01-11 MED ORDER — DEXTROSE 5 % IV SOLN
1.5000 g | INTRAVENOUS | Status: AC
  Administered 2011-01-12: 1.5 g via INTRAVENOUS
  Administered 2011-01-12: .75 g via INTRAVENOUS
  Filled 2011-01-11: qty 1.5

## 2011-01-11 MED ORDER — BISACODYL 5 MG PO TBEC
5.0000 mg | DELAYED_RELEASE_TABLET | Freq: Once | ORAL | Status: DC
  Filled 2011-01-11: qty 1

## 2011-01-11 MED ORDER — PLASMA-LYTE 148 IV SOLN
INTRAVENOUS | Status: AC
  Administered 2011-01-12: 10:00:00
  Filled 2011-01-11: qty 0.5

## 2011-01-11 MED ORDER — TEMAZEPAM 15 MG PO CAPS
15.0000 mg | ORAL_CAPSULE | Freq: Once | ORAL | Status: AC | PRN
  Administered 2011-01-11: 15 mg via ORAL
  Filled 2011-01-11: qty 1

## 2011-01-11 MED ORDER — CHLORHEXIDINE GLUCONATE 4 % EX LIQD
60.0000 mL | Freq: Once | CUTANEOUS | Status: DC
  Filled 2011-01-11: qty 60

## 2011-01-11 NOTE — Progress Notes (Signed)
ANTICOAGULATION CONSULT NOTE - Follow Up Consult  Pharmacy Consult for Heparin Indication: 3VCAD awaiting CABG  No Known Allergies  Patient Measurements: Height: 5\' 7"  (170.2 cm) Weight: 328 lb 7.8 oz (149 kg) IBW/kg (Calculated) : 61.6  Heparin dosing weight: 99 kg   Vital Signs: Temp: 98.1 F (36.7 C) (12/05 0500) Temp src: Oral (12/05 0500) BP: 138/79 mmHg (12/05 0500) Pulse Rate: 76  (12/05 0500)  Labs:  Basename 01/11/11 0655 01/10/11 0612 01/09/11 1538 01/09/11 0630  HGB 11.1* 11.7* -- --  HCT 34.6* 36.9 -- 38.1  PLT 225 238 -- 224  APTT -- -- -- --  LABPROT -- -- -- --  INR -- -- -- --  HEPARINUNFRC 0.24* 0.55 0.42 --  CREATININE -- 0.70 -- --  CKTOTAL -- -- -- --  CKMB -- -- -- --  TROPONINI -- -- -- --   Estimated Creatinine Clearance: 111.2 ml/min (by C-G formula based on Cr of 0.7).    Assessment: [name] on heparin for severe 3V CAD; awaiting CABG. Per Pt, CABG scheduled for tomorrow. Heparin level = 0.24 on 1250 units/hr. Heparin level subtherapeutic. Pt seems to have a big response to very small changes in heparin rate. No bleeding reported. CBC stable.  Goal of Therapy:  Heparin level 0.3-0.5 (per MD)   Plan:  1. Increase heparin drip rate to 1350 units/hr (13.5 ml/hr) 2. F/u 6 hr level   Lavonia Dana 01/11/2011 Pager: [phone]

## 2011-01-11 NOTE — Progress Notes (Signed)
Heparin protocol  Heparin level  = 0.4 goal 0.3-0.5 Awaiting CAGB No complication  Plan: 1. Cont heparin at 1350 units/hr 2. F/u heparin level in AM

## 2011-01-11 NOTE — Progress Notes (Signed)
5 Days Post-Op Procedure(s) (LRB): LEFT HEART CATHETERIZATION WITH CORONARY ANGIOGRAM (N/A) Subjective: No chest pain or sob.  Objective: Vital signs in last 24 hours: Temp:  [98.1 F (36.7 C)-99.1 F (37.3 C)] 99.1 F (37.3 C) (12/05 1300) Pulse Rate:  [76-84] 79  (12/05 1300) Cardiac Rhythm:  [-] Normal sinus rhythm (12/04 1955) Resp:  [18-20] 18  (12/05 1300) BP: (138-165)/(71-81) 145/71 mmHg (12/05 1300) SpO2:  [96 %-98 %] 98 % (12/05 1300)  Hemodynamic parameters for last 24 hours:    Intake/Output from previous day: 12/04 0701 - 12/05 0700 In: 1402.9 [P.O.:480; I.V.:922.9] Out: -  Intake/Output this shift: Total I/O In: 480 [P.O.:480] Out: -   General appearance: alert and cooperative Heart: regular rate and rhythm, S1, S2 normal, no murmur, click, rub or gallop Lungs: clear to auscultation bilaterally  Lab Results:  Basename 01/11/11 0655 01/10/11 0612  WBC 10.4 10.1  HGB 11.1* 11.7*  HCT 34.6* 36.9  PLT 225 238   BMET:  Basename 01/10/11 0612  NA 141  Atkinson 4.3  CL 107  CO2 25  GLUCOSE 114*  BUN 17  CREATININE 0.70  CALCIUM 9.1    PT/INR: No results found for this basename: LABPROT,INR in the last 72 hours ABG No results found for this basename: phart, pco2, po2, hco3, tco2, acidbasedef, o2sat   CBG (last 3)  No results found for this basename: GLUCAP:3 in the last 72 hours  Assessment/Plan: S/P Procedure(s) (LRB): LEFT HEART CATHETERIZATION WITH CORONARY ANGIOGRAM (N/A)  Stable for CABG in am. I discussed the operative procedure with the patient including alternatives, benefits and risks; including but not limited to bleeding, blood transfusion, infection, stroke, myocardial infarction, graft failure, heart block requiring a permanent pacemaker, organ dysfunction, and death.  Megan Atkinson understands and agrees to proceed.    LOS: 6 days    Megan Atkinson 01/11/2011

## 2011-01-11 NOTE — Progress Notes (Signed)
@   Subjective:  Denies CP or dyspnea; lower abdominal pain improving   Objective:  Filed Vitals:   01/10/11 1013 01/10/11 1324 01/10/11 2100 01/11/11 0500  BP: 168/90 145/74 158/81 138/79  Pulse:  74 78 76  Temp:   98.1 F (36.7 C) 98.1 F (36.7 C)  TempSrc:   Oral Oral  Resp:  18 20 20   Height:      Weight:      SpO2:  98% 97% 96%    Intake/Output from previous day:  Intake/Output Summary (Last 24 hours) at 01/11/11 1610 Last data filed at 01/11/11 0600  Gross per 24 hour  Intake 1402.93 ml  Output      0 ml  Net 1402.93 ml    Physical Exam: Physical exam: Well-developed well-nourished in no acute distress.  Skin is warm and dry.  HEENT is normal.  Neck is supple. No thyromegaly.  Chest is clear to auscultation with normal expansion.  Cardiovascular exam is regular rate and rhythm.  Abdominal exam not distended. No masses palpated. Mild tenderness mid lower abdomen, no rebound Extremities show no edema. neuro grossly intact    Lab Results: CBC:  Basename 01/11/11 0655 01/10/11 0612  WBC 10.4 10.1  NEUTROABS -- --  HGB 11.1* 11.7*  HCT 34.6* 36.9  MCV 93.0 93.4  PLT 225 238     Assessment/Plan:  CAD - Continue Asa, heparin, lopressor and heparin; CABG - patient states she was told surgery is tomorrow. Hyperlipidemia - continue statin. Hypertension - continue present BP meds  Olga Millers 01/11/2011, 8:07 AM

## 2011-01-12 ENCOUNTER — Inpatient Hospital Stay (HOSPITAL_COMMUNITY): Payer: No Typology Code available for payment source | Admitting: Anesthesiology

## 2011-01-12 ENCOUNTER — Inpatient Hospital Stay (HOSPITAL_COMMUNITY): Payer: No Typology Code available for payment source

## 2011-01-12 ENCOUNTER — Encounter (HOSPITAL_COMMUNITY)
Admission: EM | Disposition: A | Discharge status: Home / Self Care | Payer: Self-pay | Source: Home / Self Care | Attending: Cardiology

## 2011-01-12 ENCOUNTER — Encounter (HOSPITAL_COMMUNITY): Payer: Self-pay | Admitting: Anesthesiology

## 2011-01-12 ENCOUNTER — Other Ambulatory Visit: Payer: Self-pay

## 2011-01-12 DIAGNOSIS — I2510 Atherosclerotic heart disease of native coronary artery without angina pectoris: Secondary | ICD-10-CM

## 2011-01-12 HISTORY — PX: CORONARY ARTERY BYPASS GRAFT: SHX141

## 2011-01-12 LAB — POCT I-STAT GLUCOSE
Glucose, Bld: 118 mg/dL — ABNORMAL HIGH (ref 70–99)
Operator id: 284731

## 2011-01-12 LAB — GLUCOSE, CAPILLARY
Glucose-Capillary: 117 mg/dL — ABNORMAL HIGH (ref 70–99)
Glucose-Capillary: 95 mg/dL (ref 70–99)
Glucose-Capillary: 108 mg/dL — ABNORMAL HIGH (ref 70–99)
Glucose-Capillary: 97 mg/dL (ref 70–99)
Glucose-Capillary: 111 mg/dL — ABNORMAL HIGH (ref 70–99)

## 2011-01-12 LAB — BASIC METABOLIC PANEL
CO2: 23 mEq/L (ref 19–32)
Calcium: 9.3 mg/dL (ref 8.4–10.5)
Creatinine, Ser: 0.73 mg/dL (ref 0.50–1.10)
GFR calc non Af Amer: 90 mL/min — ABNORMAL LOW (ref >90)
Glucose, Bld: 109 mg/dL — ABNORMAL HIGH (ref 70–99)
Sodium: 137 mEq/L (ref 135–145)

## 2011-01-12 LAB — POCT I-STAT 3, ART BLOOD GAS (G3+)
Acid-base deficit: 4 mmol/L — ABNORMAL HIGH (ref 0.0–2.0)
Acid-base deficit: 2 mmol/L (ref 0.0–2.0)
Bicarbonate: 21.4 mEq/L (ref 20.0–24.0)
Bicarbonate: 22.5 mEq/L (ref 20.0–24.0)
Bicarbonate: 24 mEq/L (ref 20.0–24.0)
Patient temperature: 36.8
Patient temperature: 37
TCO2: 24 mmol/L (ref 0–100)
TCO2: 25 mmol/L (ref 0–100)
pCO2 arterial: 32.5 mmHg — ABNORMAL LOW (ref 35.0–45.0)
pCO2 arterial: 42.5 mmHg (ref 35.0–45.0)
pH, Arterial: 7.352 (ref 7.350–7.400)
pH, Arterial: 7.35 (ref 7.350–7.400)
pH, Arterial: 7.359 (ref 7.350–7.400)
pO2, Arterial: 342 mmHg — ABNORMAL HIGH (ref 80.0–100.0)
pO2, Arterial: 108 mmHg — ABNORMAL HIGH (ref 80.0–100.0)
pO2, Arterial: 131 mmHg — ABNORMAL HIGH (ref 80.0–100.0)

## 2011-01-12 LAB — POCT I-STAT 4, (NA,K, GLUC, HGB,HCT)
Glucose, Bld: 102 mg/dL — ABNORMAL HIGH (ref 70–99)
Glucose, Bld: 120 mg/dL — ABNORMAL HIGH (ref 70–99)
Glucose, Bld: 173 mg/dL — ABNORMAL HIGH (ref 70–99)
HCT: 28 % — ABNORMAL LOW (ref 36.0–46.0)
HCT: 24 % — ABNORMAL LOW (ref 36.0–46.0)
Hemoglobin: 9.5 g/dL — ABNORMAL LOW (ref 12.0–15.0)
Hemoglobin: 13.6 g/dL (ref 12.0–15.0)
Hemoglobin: 8.2 g/dL — ABNORMAL LOW (ref 12.0–15.0)
Potassium: 3.5 mEq/L (ref 3.5–5.1)
Potassium: 4 mEq/L (ref 3.5–5.1)
Sodium: 142 mEq/L (ref 135–145)
Sodium: 135 mEq/L (ref 135–145)
Sodium: 139 mEq/L (ref 135–145)

## 2011-01-12 LAB — CBC
HCT: 30.1 % — ABNORMAL LOW (ref 36.0–46.0)
HCT: 31.7 % — ABNORMAL LOW (ref 36.0–46.0)
Hemoglobin: 11.5 g/dL — ABNORMAL LOW (ref 12.0–15.0)
Hemoglobin: 10.4 g/dL — ABNORMAL LOW (ref 12.0–15.0)
MCH: 30.3 pg (ref 26.0–34.0)
MCH: 30.1 pg (ref 26.0–34.0)
MCHC: 32.4 g/dL (ref 30.0–36.0)
MCHC: 32.6 g/dL (ref 30.0–36.0)
MCHC: 32.8 g/dL (ref 30.0–36.0)
MCV: 93.4 fL (ref 78.0–100.0)
MCV: 92.3 fL (ref 78.0–100.0)
Platelets: 155 10*3/uL (ref 150–400)
RBC: 3.8 MIL/uL — ABNORMAL LOW (ref 3.87–5.11)
RDW: 14.1 % (ref 11.5–15.5)
RDW: 14.2 % (ref 11.5–15.5)

## 2011-01-12 LAB — POCT I-STAT, CHEM 8
BUN: 18 mg/dL (ref 6–23)
Chloride: 107 mEq/L (ref 96–112)
Glucose, Bld: 133 mg/dL — ABNORMAL HIGH (ref 70–99)
HCT: 29 % — ABNORMAL LOW (ref 36.0–46.0)
Potassium: 4.6 mEq/L (ref 3.5–5.1)

## 2011-01-12 LAB — CREATININE, SERUM: GFR calc non Af Amer: >90 mL/min (ref >90)

## 2011-01-12 LAB — HEMOGLOBIN AND HEMATOCRIT, BLOOD: Hemoglobin: 6 g/dL — CL (ref 12.0–15.0)

## 2011-01-12 LAB — SURGICAL PCR SCREEN: Staphylococcus aureus: NEGATIVE

## 2011-01-12 SURGERY — CORONARY ARTERY BYPASS GRAFTING (CABG)
Anesthesia: General | Site: Chest | Wound class: Clean

## 2011-01-12 MED ORDER — MORPHINE SULFATE 2 MG/ML IJ SOLN
1.0000 mg | INTRAMUSCULAR | Status: AC | PRN
  Administered 2011-01-12: 2 mg via INTRAVENOUS
  Filled 2011-01-12: qty 1

## 2011-01-12 MED ORDER — LACTATED RINGERS IV SOLN
INTRAVENOUS | Status: DC | PRN
  Administered 2011-01-12: 07:00:00 via INTRAVENOUS

## 2011-01-12 MED ORDER — SODIUM CHLORIDE 0.9 % IV SOLN
INTRAVENOUS | Status: DC
  Filled 2011-01-12: qty 1

## 2011-01-12 MED ORDER — SODIUM CHLORIDE 0.9 % IV SOLN: 250.0000 mL | INTRAVENOUS | Status: DC

## 2011-01-12 MED ORDER — MAGNESIUM SULFATE 50 % IJ SOLN
4.0000 g | Freq: Once | INTRAVENOUS | Status: AC
  Administered 2011-01-12: 4 g via INTRAVENOUS
  Filled 2011-01-12: qty 8

## 2011-01-12 MED ORDER — METOPROLOL TARTRATE 1 MG/ML IV SOLN: 2.5000 mg | INTRAVENOUS | Status: DC | PRN

## 2011-01-12 MED ORDER — ASPIRIN 81 MG PO CHEW: 324.0000 mg | CHEWABLE_TABLET | Freq: Every day | ORAL | Status: DC

## 2011-01-12 MED ORDER — PHENYLEPHRINE HCL 10 MG/ML IJ SOLN
0.0000 ug/min | INTRAVENOUS | Status: DC
  Filled 2011-01-12: qty 2

## 2011-01-12 MED ORDER — METOPROLOL TARTRATE 25 MG/10 ML ORAL SUSPENSION
12.5000 mg | Freq: Two times a day (BID) | ORAL | Status: DC
  Administered 2011-01-12: 12.5 mg
  Filled 2011-01-12 (×3): qty 5

## 2011-01-12 MED ORDER — MIDAZOLAM HCL 5 MG/5ML IJ SOLN
INTRAMUSCULAR | Status: DC | PRN
  Administered 2011-01-12: 4 mg via INTRAVENOUS
  Administered 2011-01-12 (×3): 2 mg via INTRAVENOUS

## 2011-01-12 MED ORDER — ONDANSETRON HCL 4 MG/2ML IJ SOLN: 4.0000 mg | Freq: Four times a day (QID) | INTRAMUSCULAR | Status: DC | PRN

## 2011-01-12 MED ORDER — VANCOMYCIN HCL 1000 MG IV SOLR
1000.0000 mg | Freq: Once | INTRAVENOUS | Status: AC
  Administered 2011-01-12: 1000 mg via INTRAVENOUS
  Filled 2011-01-12: qty 1000

## 2011-01-12 MED ORDER — THROMBIN 20000 UNITS EX KIT: PACK | CUTANEOUS | Status: DC | PRN

## 2011-01-12 MED ORDER — LACTATED RINGERS IV SOLN: 500.0000 mL | Freq: Once | INTRAVENOUS | Status: AC | PRN

## 2011-01-12 MED ORDER — POTASSIUM CHLORIDE 10 MEQ/50ML IV SOLN: 10.0000 meq | INTRAVENOUS | Status: AC

## 2011-01-12 MED ORDER — MORPHINE SULFATE 4 MG/ML IJ SOLN
2.0000 mg | INTRAMUSCULAR | Status: DC | PRN
  Administered 2011-01-13: 4 mg via INTRAVENOUS
  Filled 2011-01-12 (×2): qty 1

## 2011-01-12 MED ORDER — SODIUM CHLORIDE 0.9 % IV SOLN: INTRAVENOUS | Status: DC

## 2011-01-12 MED ORDER — BISACODYL 10 MG RE SUPP
10.0000 mg | Freq: Every day | RECTAL | Status: DC
  Filled 2011-01-12: qty 1

## 2011-01-12 MED ORDER — THROMBIN 20000 UNITS EX KIT
PACK | OROMUCOSAL | Status: DC | PRN
  Administered 2011-01-12: 10:00:00 via TOPICAL

## 2011-01-12 MED ORDER — SODIUM CHLORIDE 0.9 % IV SOLN
10.0000 g | INTRAVENOUS | Status: DC | PRN
  Administered 2011-01-12: 5 g/h via INTRAVENOUS

## 2011-01-12 MED ORDER — ASPIRIN EC 325 MG PO TBEC
325.0000 mg | DELAYED_RELEASE_TABLET | Freq: Every day | ORAL | Status: DC
  Administered 2011-01-13 – 2011-01-16 (×4): 325 mg via ORAL
  Filled 2011-01-12 (×4): qty 1

## 2011-01-12 MED ORDER — FENTANYL CITRATE 0.05 MG/ML IJ SOLN
INTRAMUSCULAR | Status: DC | PRN
  Administered 2011-01-12: 750 ug via INTRAVENOUS
  Administered 2011-01-12: 50 ug via INTRAVENOUS
  Administered 2011-01-12: 400 ug via INTRAVENOUS
  Administered 2011-01-12: 50 ug via INTRAVENOUS

## 2011-01-12 MED ORDER — ACETAMINOPHEN 160 MG/5ML PO SOLN: 650.0000 mg | ORAL | Status: AC

## 2011-01-12 MED ORDER — ROCURONIUM BROMIDE 100 MG/10ML IV SOLN
INTRAVENOUS | Status: DC | PRN
  Administered 2011-01-12: 50 mg via INTRAVENOUS

## 2011-01-12 MED ORDER — ACETAMINOPHEN 160 MG/5ML PO SOLN
975.0000 mg | Freq: Four times a day (QID) | ORAL | Status: DC
  Filled 2011-01-12: qty 40.6

## 2011-01-12 MED ORDER — SODIUM CHLORIDE 0.45 % IV SOLN
INTRAVENOUS | Status: DC
  Administered 2011-01-12: 20 mL via INTRAVENOUS

## 2011-01-12 MED ORDER — ALBUMIN HUMAN 5 % IV SOLN
250.0000 mL | INTRAVENOUS | Status: AC | PRN
  Administered 2011-01-12: 250 mL via INTRAVENOUS

## 2011-01-12 MED ORDER — HEMOSTATIC AGENTS (NO CHARGE) OPTIME
TOPICAL | Status: DC | PRN
  Administered 2011-01-12: 1 via TOPICAL

## 2011-01-12 MED ORDER — PROPOFOL 10 MG/ML IV EMUL
INTRAVENOUS | Status: DC | PRN
  Administered 2011-01-12: 40 mg via INTRAVENOUS

## 2011-01-12 MED ORDER — INSULIN ASPART 100 UNIT/ML ~~LOC~~ SOLN
0.0000 [IU] | SUBCUTANEOUS | Status: DC
  Filled 2011-01-12: qty 3

## 2011-01-12 MED ORDER — FAMOTIDINE IN NACL 20-0.9 MG/50ML-% IV SOLN
20.0000 mg | Freq: Two times a day (BID) | INTRAVENOUS | Status: AC
  Administered 2011-01-12 (×2): 20 mg via INTRAVENOUS
  Filled 2011-01-12: qty 50

## 2011-01-12 MED ORDER — HEPARIN SODIUM (PORCINE) 1000 UNIT/ML IJ SOLN
INTRAMUSCULAR | Status: DC | PRN
  Administered 2011-01-12: 53000 [IU] via INTRAVENOUS

## 2011-01-12 MED ORDER — SODIUM CHLORIDE 0.9 % IJ SOLN: 3.0000 mL | INTRAMUSCULAR | Status: DC | PRN

## 2011-01-12 MED ORDER — CEFUROXIME SODIUM 1.5 G IJ SOLR
1.5000 g | Freq: Two times a day (BID) | INTRAMUSCULAR | Status: AC
  Administered 2011-01-12 – 2011-01-14 (×4): 1.5 g via INTRAVENOUS
  Filled 2011-01-12 (×4): qty 1.5

## 2011-01-12 MED ORDER — SODIUM CHLORIDE 0.9 % IV SOLN
0.1000 ug/kg/h | INTRAVENOUS | Status: DC
  Administered 2011-01-12: 0.6 ug/kg/h via INTRAVENOUS
  Filled 2011-01-12: qty 2

## 2011-01-12 MED ORDER — PANTOPRAZOLE SODIUM 40 MG PO TBEC
40.0000 mg | DELAYED_RELEASE_TABLET | Freq: Every day | ORAL | Status: DC
  Administered 2011-01-14 – 2011-01-16 (×2): 40 mg via ORAL
  Filled 2011-01-12 (×2): qty 1

## 2011-01-12 MED ORDER — INSULIN ASPART 100 UNIT/ML ~~LOC~~ SOLN: 0.0000 [IU] | SUBCUTANEOUS | Status: DC

## 2011-01-12 MED ORDER — PAPAVERINE HCL 30 MG/ML IJ SOLN
INTRAMUSCULAR | Status: DC | PRN
  Administered 2011-01-12: 60 mg via INTRAVENOUS

## 2011-01-12 MED ORDER — LACTATED RINGERS IV SOLN
INTRAVENOUS | Status: DC
  Administered 2011-01-13: 16:00:00 via INTRAVENOUS

## 2011-01-12 MED ORDER — ACETAMINOPHEN 500 MG PO TABS
1000.0000 mg | ORAL_TABLET | Freq: Four times a day (QID) | ORAL | Status: DC
  Administered 2011-01-12 – 2011-01-16 (×14): 1000 mg via ORAL
  Filled 2011-01-12 (×19): qty 2

## 2011-01-12 MED ORDER — PROTAMINE SULFATE 10 MG/ML IV SOLN
INTRAVENOUS | Status: DC | PRN
  Administered 2011-01-12: 350 mg via INTRAVENOUS

## 2011-01-12 MED ORDER — VECURONIUM BROMIDE 10 MG IV SOLR
INTRAVENOUS | Status: DC | PRN
  Administered 2011-01-12: 2 mg via INTRAVENOUS
  Administered 2011-01-12 (×2): 3 mg via INTRAVENOUS
  Administered 2011-01-12: 2 mg via INTRAVENOUS

## 2011-01-12 MED ORDER — OXYCODONE HCL 5 MG PO TABS
5.0000 mg | ORAL_TABLET | ORAL | Status: DC | PRN
  Administered 2011-01-13: 10 mg via ORAL
  Administered 2011-01-13 (×3): 5 mg via ORAL
  Administered 2011-01-14 (×2): 10 mg via ORAL
  Administered 2011-01-14: 5 mg via ORAL
  Administered 2011-01-14 – 2011-01-16 (×3): 10 mg via ORAL
  Filled 2011-01-12: qty 1
  Filled 2011-01-12 (×3): qty 2
  Filled 2011-01-12 (×3): qty 1
  Filled 2011-01-12 (×3): qty 2

## 2011-01-12 MED ORDER — SODIUM CHLORIDE 0.9 % IJ SOLN: 3.0000 mL | Freq: Two times a day (BID) | INTRAMUSCULAR | Status: DC

## 2011-01-12 MED ORDER — BISACODYL 5 MG PO TBEC
10.0000 mg | DELAYED_RELEASE_TABLET | Freq: Every day | ORAL | Status: DC
  Administered 2011-01-13 – 2011-01-15 (×3): 10 mg via ORAL
  Filled 2011-01-12 (×3): qty 2

## 2011-01-12 MED ORDER — DOCUSATE SODIUM 100 MG PO CAPS
200.0000 mg | ORAL_CAPSULE | Freq: Every day | ORAL | Status: DC
  Administered 2011-01-13 – 2011-01-14 (×2): 200 mg via ORAL
  Filled 2011-01-12 (×2): qty 2

## 2011-01-12 MED ORDER — METOPROLOL TARTRATE 12.5 MG HALF TABLET
12.5000 mg | ORAL_TABLET | Freq: Two times a day (BID) | ORAL | Status: DC
  Filled 2011-01-12 (×3): qty 1

## 2011-01-12 MED ORDER — NITROGLYCERIN IN D5W 200-5 MCG/ML-% IV SOLN: 0.0000 ug/min | INTRAVENOUS | Status: DC

## 2011-01-12 MED ORDER — MIDAZOLAM HCL 2 MG/2ML IJ SOLN: 2.0000 mg | INTRAMUSCULAR | Status: DC | PRN

## 2011-01-12 MED ORDER — ACETAMINOPHEN 650 MG RE SUPP
650.0000 mg | RECTAL | Status: AC
  Administered 2011-01-12: 650 mg via RECTAL

## 2011-01-12 SURGICAL SUPPLY — 109 items
ADAPTER CARDIO PERF ANTE/RETRO (ADAPTER) IMPLANT
ADPR PRFSN 84XANTGRD RTRGD (ADAPTER)
ATTRACTOMAT 16X20 MAGNETIC DRP (DRAPES) ×2 IMPLANT
BAG DECANTER FOR FLEXI CONT (MISCELLANEOUS) ×2 IMPLANT
BANDAGE ELASTIC 4 VELCRO ST LF (GAUZE/BANDAGES/DRESSINGS) ×2 IMPLANT
BANDAGE ELASTIC 6 VELCRO ST LF (GAUZE/BANDAGES/DRESSINGS) ×2 IMPLANT
BANDAGE GAUZE ELAST BULKY 4 IN (GAUZE/BANDAGES/DRESSINGS) ×2 IMPLANT
BASKET HEART (ORDER IN 25'S) (MISCELLANEOUS) ×1
BASKET HEART (ORDER IN 25S) (MISCELLANEOUS) ×1 IMPLANT
BLADE SAW STERNAL (BLADE) ×2 IMPLANT
BLADE SURG ROTATE 9660 (MISCELLANEOUS) IMPLANT
CANISTER SUCTION 2500CC (MISCELLANEOUS) ×2 IMPLANT
CANNULA GUNDRY RCSP 15FR (MISCELLANEOUS) IMPLANT
CATH ROBINSON RED A/P 18FR (CATHETERS) ×4 IMPLANT
CATH THORACIC 28FR (CATHETERS) ×2 IMPLANT
CATH THORACIC 28FR RT ANG (CATHETERS) IMPLANT
CATH THORACIC 36FR (CATHETERS) ×2 IMPLANT
CATH THORACIC 36FR RT ANG (CATHETERS) ×2 IMPLANT
CLIP FOGARTY SPRING 6M (CLIP) IMPLANT
CLIP TI MEDIUM 24 (CLIP) IMPLANT
CLIP TI WIDE RED SMALL 24 (CLIP) ×1 IMPLANT
CLOTH BEACON ORANGE TIMEOUT ST (SAFETY) ×2 IMPLANT
COVER SURGICAL LIGHT HANDLE (MISCELLANEOUS) ×4 IMPLANT
CRADLE DONUT ADULT HEAD (MISCELLANEOUS) ×2 IMPLANT
DRAPE CARDIOVASCULAR INCISE (DRAPES) ×2
DRAPE SLUSH MACHINE 52X66 (DRAPES) IMPLANT
DRAPE SLUSH/WARMER DISC (DRAPES) IMPLANT
DRAPE SRG 135X102X78XABS (DRAPES) ×1 IMPLANT
DRSG COVADERM 4X14 (GAUZE/BANDAGES/DRESSINGS) ×2 IMPLANT
ELECT CAUTERY BLADE 6.4 (BLADE) ×2 IMPLANT
ELECT REM PT RETURN 9FT ADLT (ELECTROSURGICAL) ×4
ELECTRODE REM PT RTRN 9FT ADLT (ELECTROSURGICAL) ×2 IMPLANT
GAUZE SPONGE 4X4 12PLY STRL LF (GAUZE/BANDAGES/DRESSINGS) ×2 IMPLANT
GLOVE BIO SURGEON STRL SZ 6 (GLOVE) IMPLANT
GLOVE BIO SURGEON STRL SZ 6.5 (GLOVE) IMPLANT
GLOVE BIO SURGEON STRL SZ7 (GLOVE) IMPLANT
GLOVE BIO SURGEON STRL SZ7.5 (GLOVE) IMPLANT
GLOVE BIOGEL PI IND STRL 6 (GLOVE) IMPLANT
GLOVE BIOGEL PI IND STRL 6.5 (GLOVE) IMPLANT
GLOVE BIOGEL PI IND STRL 7.0 (GLOVE) IMPLANT
GLOVE BIOGEL PI INDICATOR 6 (GLOVE)
GLOVE BIOGEL PI INDICATOR 6.5 (GLOVE)
GLOVE BIOGEL PI INDICATOR 7.0 (GLOVE)
GLOVE EUDERMIC 7 POWDERFREE (GLOVE) ×4 IMPLANT
GLOVE ORTHO TXT STRL SZ7.5 (GLOVE) IMPLANT
GOWN PREVENTION PLUS XLARGE (GOWN DISPOSABLE) ×2 IMPLANT
GOWN STRL NON-REIN LRG LVL3 (GOWN DISPOSABLE) ×8 IMPLANT
HEMOSTAT POWDER SURGIFOAM 1G (HEMOSTASIS) ×4 IMPLANT
HEMOSTAT SURGICEL 2X14 (HEMOSTASIS) ×2 IMPLANT
INSERT FOGARTY 61MM (MISCELLANEOUS) IMPLANT
INSERT FOGARTY XLG (MISCELLANEOUS) ×1 IMPLANT
KIT BASIN OR (CUSTOM PROCEDURE TRAY) ×2 IMPLANT
KIT CATH CPB BARTLE (MISCELLANEOUS) ×2 IMPLANT
KIT ROOM TURNOVER OR (KITS) ×2 IMPLANT
KIT SUCTION CATH 14FR (SUCTIONS) ×3 IMPLANT
KIT VASOVIEW W/TROCAR VH 2000 (KITS) ×2 IMPLANT
NS IRRIG 1000ML POUR BTL (IV SOLUTION) ×10 IMPLANT
PACK OPEN HEART (CUSTOM PROCEDURE TRAY) ×2 IMPLANT
PAD ARMBOARD 7.5X6 YLW CONV (MISCELLANEOUS) ×4 IMPLANT
PENCIL BUTTON HOLSTER BLD 10FT (ELECTRODE) ×2 IMPLANT
PUNCH AORTIC ROTATE 4.0MM (MISCELLANEOUS) IMPLANT
PUNCH AORTIC ROTATE 4.5MM 8IN (MISCELLANEOUS) ×2 IMPLANT
PUNCH AORTIC ROTATE 5MM 8IN (MISCELLANEOUS) IMPLANT
SET CARDIOPLEGIA MPS 5001102 (MISCELLANEOUS) ×1 IMPLANT
SOLUTION ANTI FOG 6CC (MISCELLANEOUS) IMPLANT
SPONGE GAUZE 4X4 12PLY (GAUZE/BANDAGES/DRESSINGS) ×4 IMPLANT
SPONGE INTESTINAL PEANUT (DISPOSABLE) IMPLANT
SPONGE LAP 18X18 X RAY DECT (DISPOSABLE) IMPLANT
SPONGE LAP 4X18 X RAY DECT (DISPOSABLE) ×2 IMPLANT
SUT BONE WAX W31G (SUTURE) ×2 IMPLANT
SUT MNCRL AB 4-0 PS2 18 (SUTURE) ×1 IMPLANT
SUT PROLENE 3 0 SH DA (SUTURE) ×1 IMPLANT
SUT PROLENE 3 0 SH1 36 (SUTURE) IMPLANT
SUT PROLENE 4 0 RB 1 (SUTURE)
SUT PROLENE 4 0 SH DA (SUTURE) IMPLANT
SUT PROLENE 4-0 RB1 .5 CRCL 36 (SUTURE) IMPLANT
SUT PROLENE 5 0 C 1 36 (SUTURE) IMPLANT
SUT PROLENE 6 0 C 1 30 (SUTURE) ×2 IMPLANT
SUT PROLENE 6 0 CC (SUTURE) IMPLANT
SUT PROLENE 7 0 BV 1 (SUTURE) IMPLANT
SUT PROLENE 7 0 BV1 MDA (SUTURE) ×2 IMPLANT
SUT PROLENE 7.0 RB 3 (SUTURE) ×2 IMPLANT
SUT PROLENE 8 0 BV175 6 (SUTURE) ×1 IMPLANT
SUT SILK  1 MH (SUTURE)
SUT SILK 1 MH (SUTURE) IMPLANT
SUT SILK 2 0 SH CR/8 (SUTURE) IMPLANT
SUT SILK 3 0 SH CR/8 (SUTURE) IMPLANT
SUT STEEL STERNAL CCS#1 18IN (SUTURE) IMPLANT
SUT STEEL SZ 6 DBL 3X14 BALL (SUTURE) IMPLANT
SUT VIC AB 1 CTX 36 (SUTURE) ×4
SUT VIC AB 1 CTX36XBRD ANBCTR (SUTURE) ×2 IMPLANT
SUT VIC AB 2-0 CT1 27 (SUTURE) ×4
SUT VIC AB 2-0 CT1 TAPERPNT 27 (SUTURE) IMPLANT
SUT VIC AB 2-0 CTX 27 (SUTURE) IMPLANT
SUT VIC AB 3-0 SH 27 (SUTURE) ×2
SUT VIC AB 3-0 SH 27X BRD (SUTURE) IMPLANT
SUT VIC AB 3-0 X1 27 (SUTURE) IMPLANT
SUT VICRYL 4-0 PS2 18IN ABS (SUTURE) IMPLANT
SUTURE E-PAK OPEN HEART (SUTURE) ×2 IMPLANT
SYSTEM SAHARA CHEST DRAIN ATS (WOUND CARE) ×2 IMPLANT
TOWEL OR 17X24 6PK STRL BLUE (TOWEL DISPOSABLE) ×2 IMPLANT
TOWEL OR 17X26 10 PK STRL BLUE (TOWEL DISPOSABLE) ×2 IMPLANT
TRAY FOLEY IC TEMP SENS 14FR (CATHETERS) ×2 IMPLANT
TUBE CONNECTING 12X1/4 (SUCTIONS) ×1 IMPLANT
TUBE SUCT INTRACARD DLP 20F (MISCELLANEOUS) ×2 IMPLANT
TUBING INSUFFLATION 10FT LAP (TUBING) ×2 IMPLANT
UNDERPAD 30X30 INCONTINENT (UNDERPADS AND DIAPERS) ×2 IMPLANT
WATER STERILE IRR 1000ML POUR (IV SOLUTION) ×4 IMPLANT
YANKAUER SUCT BULB TIP NO VENT (SUCTIONS) ×1 IMPLANT

## 2011-01-12 NOTE — Anesthesia Postprocedure Evaluation (Signed)
  Anesthesia Post-op Note  Patient: Megan Atkinson  Procedure(s) Performed:  CORONARY ARTERY BYPASS GRAFTING (CABG) - coronary artery bypass graft times one using left internal mammary artery . Attempted endoscopic saphenous vein harvest  Patient Location: SICU  Anesthesia Type: General  Level of Consciousness: sedated and Patient remains intubated per anesthesia plan  Airway and Oxygen Therapy: Patient remains intubated per anesthesia plan and Patient placed on Ventilator (see vital sign flow sheet for setting)  Post-op Pain: patient sedated and unresponsive on ventilator  Post-op Assessment: Post-op Vital signs reviewed, Patient's Cardiovascular Status Stable and Respiratory Function Stable  Post-op Vital Signs: Reviewed and stable  Complications: No apparent anesthesia complications

## 2011-01-12 NOTE — Brief Op Note (Signed)
01/05/2011 - 01/12/2011  11:45 AM  PATIENT:  Megan Atkinson  60 y.o. female  PRE-OPERATIVE DIAGNOSIS:  CAD  POST-OPERATIVE DIAGNOSIS:  coronary artery disease  PROCEDURE:  Procedure(s): CORONARY ARTERY BYPASS GRAFTING (CABG) x 1 with LIMA to LAD, EVH right leg  SURGEON:  Surgeon(s): Alleen Borne, MD  PHYSICIAN ASSISTANT:   ASSISTANTS: Al Corpus, SA  ANESTHESIA:   general  EBL:  Total I/O In: 500 [Blood:500] Out: 675 [Urine:375; Blood:300]    Very difficult vein harvest due to morbid obesity.  Right saphenous vein small, unsuitable.  Since RCA was small, diffusely diseased and well-collateralized I decided not to graft it.

## 2011-01-12 NOTE — Op Note (Signed)
Megan Atkinson, Megan Atkinson             ACCOUNT NO.:  [id number]  MEDICAL RECORD NO.:  [id number]  LOCATION:  2301                         FACILITY:  [facility]  PHYSICIAN:  Evelene Croon, M.D.     DATE OF BIRTH:  [birth year]  DATE OF PROCEDURE:  01/12/2011 DATE OF DISCHARGE:                              OPERATIVE REPORT   PREOPERATIVE DIAGNOSIS:  Severe two-vessel coronary artery disease.  POSTOPERATIVE DIAGNOSIS:  Severe two-vessel coronary artery disease.  OPERATIVE PROCEDURE:  Median sternotomy, extracorporeal circulation, coronary artery bypass graft surgery x1 using a left internal mammary artery graft to the left anterior descending coronary artery, endoscopic vein harvesting from the right leg.  ATTENDING SURGEON:  Evelene Croon, MD  ASSISTANT:  Al Corpus, [facility]  CLINICAL HISTORY:  This patient is a 60 year old, morbidly obese woman with no prior cardiac history, who presented with a long and progressive history of worsening exertional shortness of breath, and recent development of right-sided chest pain with physical activity.  She is admitted after feeling particularly weak and short of breath and ruled out for myocardial infarction.  Catheterization showed severe two-vessel coronary artery disease with a 90-95% proximal LAD stenosis.  There was a small first diagonal branch that had about 90% stenosis.  The right coronary artery was occluded proximally with filling of a small distal vessel by collaterals from the left.  Left ventricular ejection fraction was 65%.  After review of the catheterization and examination of the patient, it was felt that coronary artery bypass graft surgery is the best treatment.  I discussed use of a left internal mammary graft to the LAD and told her that I may bypass the distal right coronary artery depending on what it looked like  and whether we could find a suitable vein.  She has very large legs, a large amount of subcutaneous fat, and I was  concerned about whether we would be able to find the saphenous vein and get it out of her legs.  I discussed the operative procedure with her including alternatives, benefits, and risks including, but not limited to bleeding, blood transfusion, infection, stroke, myocardial infarction, graft failure, and death.  She understood and agreed to proceed.  OPERATIVE PROCEDURE:  The patient was taken to the operating room and placed on the table in supine position.  After induction of general endotracheal anesthesia, a Foley catheter was placed in bladder using sterile technique.  Then, the chest, abdomen, and both lower extremities were prepped and draped in usual sterile manner.  The chest was entered through a median sternotomy incision, and the pericardium opened in midline.  Examination of the heart showed good ventricular contractility.  The ascending aorta had no palpable plaques in it.  Then, the left internal mammary artery was harvested from the chest wall as a pedicle graft.  This was a medium caliber vessel with excellent blood flow through it.  At the same time, we harvested a piece of greater saphenous vein from the right leg.  This took a very long time and it was quite difficult due to large amount of subcutaneous fat and difficulty keeping the tunnel open to allow the vein harvesting endoscopically.  I did not feel the  open vein harvest was a good idea given the large amount of subcutaneous fat.  The saphenous vein unfortunately was fairly small and did not dilate at all and was not felt to be suitable for use as a bypass conduit.  Since the distal right coronary artery was collateralized and was also relatively small and diffusely diseased, I thought it would be best to leave alone and just graft the LAD.  Then, the patient was heparinized and when an adequate ACT was obtained, the distal ascending aorta was cannulated using a 20-French aortic cannula for arterial inflow.   Venous outflow was achieved using a two- stage venous cannula for the right atrial appendage.  An antegrade cardioplegia and vent cannula was inserted in the aortic root.  The patient was placed on cardiopulmonary bypass and distal coronary was identified.  The LAD was a large graftable vessel.  There was some segmental plaque in the midportion just as the vessel exits to the surface of the heart but beyond that, there was no visible disease.  The distal right coronary artery was diffusely diseased and fairly small.  Then, the aorta was crossclamped and 800 mL of cold blood antegrade cardioplegia was administered in the aortic root with quick arrest of the heart.  Topical hypothermic iced saline was used.  The patient already cooled to about 34 degrees centigrade, and therefore, we began rewarming immediately.  An insulating pad was placed in the pericardium and a temperature probe in the septum.  The single distal anastomosis was then performed to the distal LAD.  The internal diameter here was about 1.75 mm.  The conduit used was a left internal mammary graft and was brought through an opening in the left pericardium anterior to the phrenic nerve.  This anastomosed to the LAD in an end-to-side manner using continuous 8-0 Prolene suture.  The pedicle was sutured to the epicardium with 6-0 Prolene sutures.  Then, the clamp was removed from mammary pedicle.  There was rapid warming of the ventricular septum and return of spontaneous ventricular fibrillation.  The crossclamp was removed with a time of 24 minutes, and the patient was defibrillated into sinus rhythm.  The proximal and distal anastomoses appeared hemostatic and allowed the grafts satisfactory.  Two temporary right ventricular and right atrial pacing wires were placed above through the skin.  When the patient had rewarmed to 37 degrees centigrade, she was weaned from cardiopulmonary bypass on no inotropic agents.  Total  bypass time was 39 minutes.  Cardiac function appeared excellent with a cardiac output of 7 L/minute. Protamine was given and the venous and aortic cannula was removed without difficulty.  Hemostasis was achieved.  Three chest tubes were placed with a tube in the posterior pericardium, 1 in the left pleural space, and 1 in the anterior mediastinum.  The sternum was then closed with double #6 stainless steel wires.  The fascia was closed with continuous #1 Vicryl suture.  Subcutaneous tissues were closed with continuous 2-0 Vicryl and the skin with a 3-0 Vicryl subcuticular closure.  The lower extremity vein harvest site was closed in layers in similar manner.  Sponge, needle, and instrument counts were correct according to scrub nurse.  Dry sterile dressing was applied over the incisions around the chest tubes, which were hooked to Pleur-Evac suction.  The patient remained hemodynamically stable and was transported to the SICU in guarded but stable condition.     Evelene Croon, M.D.     BB/MEDQ  D:  01/12/2011  T:  01/12/2011  Job:  [id number]

## 2011-01-12 NOTE — Progress Notes (Signed)
EXtubation note Pt extubated at 1535 per protocol. Pt awake and alert, placed on 3L [place], sat 96%. NIF -20, VC 750, positive cuff leak. Pt stable throughout with no apparent complications.

## 2011-01-12 NOTE — Progress Notes (Signed)
[address]            [place] [address]          [phone]     Day of Surgery Procedure(s) (LRB): CORONARY ARTERY BYPASS GRAFTING (CABG) (N/A)  LOS: 7 days   Subjective: Now extubated sleepy but neuro intact  Objective: Vital signs in last 24 hours: Patient Vitals for the past 24 hrs:  BP Temp Temp src Pulse Resp SpO2  01/12/11 1730 93/53 mmHg 99 F (37.2 C) - 90  29  97 %  01/12/11 1715 - 98.8 F (37.1 C) - 89  22  98 %  01/12/11 1700 91/57 mmHg 98.6 F (37 C) Core 90  26  96 %  01/12/11 1645 - 98.6 F (37 C) - 90  26  99 %  01/12/11 1630 84/64 mmHg 98.6 F (37 C) - 90  24  99 %  01/12/11 1615 - 98.6 F (37 C) - 90  30  99 %  01/12/11 1600 103/54 mmHg 98.6 F (37 C) Core 90  21  97 %  01/12/11 1545 - 98.4 F (36.9 C) - 90  28  95 %  01/12/11 1535 125/68 mmHg - - 90  13  96 %  01/12/11 1530 92/57 mmHg 98.2 F (36.8 C) - 90  26  98 %  01/12/11 1515 - 98.1 F (36.7 C) - 90  22  96 %  01/12/11 1506 93/60 mmHg 98.2 F (36.8 C) - 90  25  96 %  01/12/11 1500 88/54 mmHg 98.2 F (36.8 C) Core 90  20  98 %  01/12/11 1445 97/64 mmHg 97.7 F (36.5 C) - 90  21  99 %  01/12/11 1430 97/64 mmHg 97.7 F (36.5 C) - 90  12  98 %  01/12/11 1415 - 97.5 F (36.4 C) - 90  12  97 %  01/12/11 1400 91/61 mmHg 97.5 F (36.4 C) Core 90  0  97 %  01/12/11 1345 - 97.3 F (36.3 C) - 90  13  97 %  01/12/11 1330 111/76 mmHg 96.8 F (36 C) - 90  11  98 %  01/12/11 1315 - 96.4 F (35.8 C) - 90  19  98 %  01/12/11 1300 92/61 mmHg 95.9 F (35.5 C) Core 90  16  98 %  01/12/11 1245 96/61 mmHg 95.5 F (35.3 C) Core 90  15  98 %  01/12/11 1230 - 95.4 F (35.2 C) Core 90  22  98 %  01/12/11 0500 140/80 mmHg 98.6 F (37 C) Oral 79  20  98 %  01/11/11 2100 145/84 mmHg 98.3 F (36.8 C) Oral 86  20  98 %   Wt Readings from Last 3 Encounters:  01/07/11 328 lb 7.8 oz (149 kg)  01/07/11 328 lb 7.8 oz (149 kg)  01/07/11 328 lb 7.8 oz (149 kg)     Hemodynamic parameters for last 24 hours: PAP: (24-41)/(14-25) 39/14 mmHg CO:  [3.7 L/min-4.2 L/min] 4.2 L/min CI:  [1.5 L/min/m2-1.7 L/min/m2] 1.7 L/min/m2  Intake/Output from previous day: 12/05 0701 - 12/06 0700 In: 786.5 [P.O.:480; I.V.:306.5] Out: -  Intake/Output this shift: Total I/O In: 3061.7 [I.V.:2231.7; Blood:500; NG/GT:30; IV Piggyback:300] Out: 1130 [Urine:770; Blood:300; Chest Tube:60]  Scheduled Meds:   . acetaminophen (TYLENOL) oral liquid 160 mg/5 mL  650 mg Per Tube NOW  Or  . acetaminophen  650 mg Rectal NOW  . acetaminophen  1,000 mg Oral Q6H   Or  . acetaminophen (TYLENOL) oral liquid 160 mg/5 mL  975 mg Per Tube Q6H  . aspirin EC  325 mg Oral Daily   Or  . aspirin  324 mg Per Tube Daily  . bisacodyl  10 mg Oral Daily   Or  . bisacodyl  10 mg Rectal Daily  . cefUROXime (ZINACEF)  IV  1.5 g Intravenous To OR  . cefUROXime (ZINACEF)  IV  1.5 g Intravenous Q12H  . chlorhexidine  60 mL Topical Once  . dexmedetomidine (PRECEDEX) IV infusion for high rates  0.1-0.7 mcg/kg/hr Intravenous To OR  . diazepam  10 mg Oral Once  . docusate sodium  200 mg Oral Daily  . famotidine (PEPCID) IV  20 mg Intravenous Q12H  . heparin-papaverine-plasmalyte irrigation   Irrigation To OR  . insulin aspart  0-24 Units Subcutaneous Q4H  . insulin aspart  0-24 Units Subcutaneous Q2H  . insulin (NOVOLIN-R) infusion   Intravenous To OR  . magnesium sulfate infusion  4 g Intravenous Once  . metoprolol tartrate  12.5 mg Oral BID   Or  . metoprolol tartrate  12.5 mg Per Tube BID  . metoprolol tartrate  12.5 mg Oral Once  . nitroGLYCERIN  2-200 mcg/min Intravenous To OR  . pantoprazole  40 mg Oral Q1200  . phenylephrine (NEO-SYNEPHRINE) Adult infusion  30-200 mcg/min Intravenous To OR  . potassium chloride  10 mEq Intravenous Q1 Hr x 3  . rosuvastatin  20 mg Oral Daily  . sodium chloride  3 mL Intravenous Q12H  . vancomycin (VANCOCIN) IVPB 1000 mg/100 mL central line   1,000 mg Intravenous Once  . vancomycin  1,500 mg Intravenous To OR  . DISCONTD: aminocaproic acid (AMICAR) for OHS   Intravenous To OR  . DISCONTD: amLODipine  10 mg Oral Daily  . DISCONTD: aspirin EC  81 mg Oral Daily  . DISCONTD: bisacodyl  5 mg Oral Once  . DISCONTD: cefUROXime (ZINACEF)  IV  750 mg Intravenous To OR  . DISCONTD: chlorhexidine  60 mL Topical Once  . DISCONTD: chlorhexidine  60 mL Topical Once  . DISCONTD: DOPamine  2-20 mcg/kg/min Intravenous To OR  . DISCONTD: epinephrine  0.5-20 mcg/min Intravenous To OR  . DISCONTD: magnesium sulfate  40 mEq Other To OR  . DISCONTD: metoprolol tartrate  25 mg Oral BID  . DISCONTD: potassium chloride  80 mEq Other To OR   Continuous Infusions:   . sodium chloride 20 mL (01/12/11 1256)  . sodium chloride    . sodium chloride    . dexmedetomidine (PRECEDEX) IV infusion Stopped (01/12/11 1440)  . insulin (NOVOLIN-R) infusion 0.7 Units/hr (01/12/11 1753)  . lactated ringers 20 mL/hr at 01/12/11 1700  . nitroGLYCERIN Stopped (01/12/11 1300)  . phenylephrine (NEO-SYNEPHRINE) Adult infusion 15 mcg/min (01/12/11 1730)  . DISCONTD: heparin 1,350 Units/hr (01/11/11 1154)   PRN Meds:.albumin human, lactated ringers, metoprolol, midazolam, morphine injection, morphine, ondansetron (ZOFRAN) IV, oxyCODONE, sodium chloride, temazepam, DISCONTD: acetaminophen, DISCONTD: ALPRAZolam, DISCONTD: ALPRAZolam, DISCONTD: hemostatic agents, DISCONTD: metoprolol, DISCONTD: nitroGLYCERIN, DISCONTD: ondansetron (ZOFRAN) IV, DISCONTD: papaverine, DISCONTD: Surgifoam 1 Gm with Thrombin 20,000 units (20 ml) topical solution, DISCONTD: thrombin DISCONTD: zolpidem    Lab Results: CBC: Basename 01/12/11 1220 01/12/11 1107 01/12/11 1021 01/12/11 0526  WBC 19.4* -- -- 14.0*  HGB 9.8* 8.2* -- --  HCT 30.1* 24.0* -- --  PLT 155 --  145* --   BMET:  Basename 01/12/11 1107 01/12/11 1020 01/12/11 0526 01/10/11 0612  NA 138 135 -- --  K 4.4 4.3 -- --  CL --  -- 105 107  CO2 -- -- 23 25  GLUCOSE 173* 106* -- --  BUN -- -- 16 17  CREATININE -- -- 0.73 0.70  CALCIUM -- -- 9.3 9.1    PT/INR:  Basename 01/12/11 1220  LABPROT 16.2*  INR 1.27     Radiology Dg Chest 2 View  01/12/2011  *RADIOLOGY REPORT*  Clinical Data: Preoperative chest radiograph for CABG.  CHEST - 2 VIEW  Comparison: Chest radiograph performed 01/08/2011  Findings: The lungs are well-aerated.  Mild left basilar airspace opacity likely reflects atelectasis.  There is no evidence of pleural effusion or pneumothorax.  The heart is normal in size; the mediastinal contour is within normal limits.  No acute osseous abnormalities are seen.  IMPRESSION: Mild left basilar airspace opacity likely reflects atelectasis; lungs otherwise grossly clear.  Original Report Authenticated By: Tonia Ghent, M.D.   Dg Chest Portable 1 View  01/12/2011  *RADIOLOGY REPORT*  Clinical Data: Post CABG  PORTABLE CHEST - 1 VIEW  Comparison: Chest x-ray of 01/12/2011  Findings: Median sternotomy sutures are now noted from CABG.  The lungs are not well aerated with areas of atelectasis bilaterally. There also may be a degree of pulmonary vascular congestion present.  An endotracheal tube is present with the tip approximately 2.7 cm above the carina.  Swan-Ganz catheter is present with the tip in the main pulmonary artery.  A left chest tube is noted and no pneumothorax is seen.  Cardiomegaly is stable.  IMPRESSION:  1.  Status post CABG with diminished aeration, areas of atelectasis, possible mild congestion. 2.  Tip of endotracheal tube 2.7 cm above carina. 3.  Left chest tube.  No pneumothorax.  Original Report Authenticated By: Juline Patch, M.D.     Assessment/Plan: S/P Procedure(s) (LRB): CORONARY ARTERY BYPASS GRAFTING (CABG) (N/A) Stable post op not bleeding   Delight Ovens MD 01/12/2011 6:13 PM

## 2011-01-12 NOTE — Preoperative (Signed)
Beta Blockers   Reason not to administer Beta Blockers:Not Applicable 

## 2011-01-12 NOTE — Transfer of Care (Signed)
Immediate Anesthesia Transfer of Care Note  Patient: Megan Atkinson  Procedure(s) Performed:  CORONARY ARTERY BYPASS GRAFTING (CABG) - coronary artery bypass graft times one using left internal mammary artery . Attempted endoscopic saphenous vein harvest  Patient Location: SICU and Nursing Unit  Anesthesia Type: General  Level of Consciousness: sedated and Patient remains intubated per anesthesia plan  Airway & Oxygen Therapy: Patient remains intubated per anesthesia plan and Patient placed on Ventilator (see vital sign flow sheet for setting)  Post-op Assessment: Report given to PACU RN and Post -op Vital signs reviewed and stable  Post vital signs: Reviewed and stable  Complications: No apparent anesthesia complications

## 2011-01-12 NOTE — Anesthesia Preprocedure Evaluation (Addendum)
Anesthesia Evaluation  Patient identified by MRN, date of birth, ID band Patient awake    Reviewed: Allergy & Precautions, H&P , NPO status , Patient's Chart, lab work & pertinent test results  Airway Mallampati: I TM Distance: >3 FB Neck ROM: Full    Dental  (+) Edentulous Upper, Edentulous Lower and Dental Advisory Given   Pulmonary    Pulmonary exam normal       Cardiovascular hypertension, Pt. on medications + CAD     Neuro/Psych    GI/Hepatic   Endo/Other  Morbid obesity  Renal/GU      Musculoskeletal   Abdominal   Peds  Hematology   Anesthesia Other Findings   Reproductive/Obstetrics                          Anesthesia Physical Anesthesia Plan  ASA: III  Anesthesia Plan: General   Post-op Pain Management:    Induction: Intravenous  Airway Management Planned: Oral ETT  Additional Equipment: Arterial line, Ultrasound Guidance Line Placement, CVP and PA Cath  Intra-op Plan:   Post-operative Plan: Post-operative intubation/ventilation  Informed Consent: I have reviewed the patients History and Physical, chart, labs and discussed the procedure including the risks, benefits and alternatives for the proposed anesthesia with the patient or authorized representative who has indicated his/her understanding and acceptance.     Plan Discussed with: CRNA and Surgeon  Anesthesia Plan Comments:         Anesthesia Quick Evaluation

## 2011-01-13 ENCOUNTER — Encounter (HOSPITAL_COMMUNITY): Payer: Self-pay | Admitting: Surgery

## 2011-01-13 ENCOUNTER — Inpatient Hospital Stay (HOSPITAL_COMMUNITY): Payer: No Typology Code available for payment source

## 2011-01-13 LAB — CBC
HCT: 30.8 % — ABNORMAL LOW (ref 36.0–46.0)
HCT: 30.3 % — ABNORMAL LOW (ref 36.0–46.0)
Hemoglobin: 10.1 g/dL — ABNORMAL LOW (ref 12.0–15.0)
Hemoglobin: 9.9 g/dL — ABNORMAL LOW (ref 12.0–15.0)
MCH: 30.4 pg (ref 26.0–34.0)
MCHC: 32.7 g/dL (ref 30.0–36.0)
MCV: 91.9 fL (ref 78.0–100.0)
RBC: 3.35 MIL/uL — ABNORMAL LOW (ref 3.87–5.11)
RDW: 14.1 % (ref 11.5–15.5)
RDW: 14.4 % (ref 11.5–15.5)
WBC: 17.3 10*3/uL — ABNORMAL HIGH (ref 4.0–10.5)

## 2011-01-13 LAB — POCT I-STAT, CHEM 8
Calcium, Ion: 1.2 mmol/L (ref 1.12–1.32)
Creatinine, Ser: 0.8 mg/dL (ref 0.50–1.10)
Glucose, Bld: 136 mg/dL — ABNORMAL HIGH (ref 70–99)
Hemoglobin: 10.5 g/dL — ABNORMAL LOW (ref 12.0–15.0)
TCO2: 23 mmol/L (ref 0–100)

## 2011-01-13 LAB — GLUCOSE, CAPILLARY
Glucose-Capillary: 104 mg/dL — ABNORMAL HIGH (ref 70–99)
Glucose-Capillary: 105 mg/dL — ABNORMAL HIGH (ref 70–99)
Glucose-Capillary: 97 mg/dL (ref 70–99)
Glucose-Capillary: 131 mg/dL — ABNORMAL HIGH (ref 70–99)
Glucose-Capillary: 99 mg/dL (ref 70–99)
Glucose-Capillary: 94 mg/dL (ref 70–99)
Glucose-Capillary: 113 mg/dL — ABNORMAL HIGH (ref 70–99)
Glucose-Capillary: 146 mg/dL — ABNORMAL HIGH (ref 70–99)

## 2011-01-13 LAB — BASIC METABOLIC PANEL
BUN: 20 mg/dL (ref 6–23)
CO2: 21 mEq/L (ref 19–32)
Chloride: 106 mEq/L (ref 96–112)
Creatinine, Ser: 0.72 mg/dL (ref 0.50–1.10)
GFR calc Af Amer: >90 mL/min (ref >90)
Potassium: 4.2 mEq/L (ref 3.5–5.1)

## 2011-01-13 MED ORDER — FUROSEMIDE 10 MG/ML IJ SOLN
40.0000 mg | Freq: Two times a day (BID) | INTRAMUSCULAR | Status: AC
  Administered 2011-01-13 (×2): 40 mg via INTRAVENOUS
  Filled 2011-01-13: qty 4

## 2011-01-13 MED ORDER — METOPROLOL TARTRATE 25 MG PO TABS
25.0000 mg | ORAL_TABLET | Freq: Two times a day (BID) | ORAL | Status: DC
  Administered 2011-01-13 – 2011-01-14 (×3): 25 mg via ORAL
  Filled 2011-01-13 (×4): qty 1

## 2011-01-13 MED ORDER — INSULIN GLARGINE 100 UNIT/ML ~~LOC~~ SOLN
20.0000 [IU] | SUBCUTANEOUS | Status: DC
  Administered 2011-01-13 – 2011-01-15 (×3): 20 [IU] via SUBCUTANEOUS
  Filled 2011-01-13: qty 3

## 2011-01-13 MED ORDER — METOPROLOL TARTRATE 25 MG/10 ML ORAL SUSPENSION: 12.5000 mg | Freq: Two times a day (BID) | ORAL | Status: DC

## 2011-01-13 MED ORDER — ENOXAPARIN SODIUM 40 MG/0.4ML ~~LOC~~ SOLN
40.0000 mg | Freq: Every day | SUBCUTANEOUS | Status: DC
  Administered 2011-01-13: 40 mg via SUBCUTANEOUS
  Filled 2011-01-13 (×2): qty 0.4

## 2011-01-13 MED ORDER — POTASSIUM CHLORIDE CRYS ER 20 MEQ PO TBCR
40.0000 meq | EXTENDED_RELEASE_TABLET | Freq: Two times a day (BID) | ORAL | Status: AC
  Administered 2011-01-13 (×2): 40 meq via ORAL
  Filled 2011-01-13 (×2): qty 2

## 2011-01-13 MED ORDER — INSULIN ASPART 100 UNIT/ML ~~LOC~~ SOLN
0.0000 [IU] | SUBCUTANEOUS | Status: AC
  Administered 2011-01-13: 2 [IU] via SUBCUTANEOUS

## 2011-01-13 MED ORDER — INSULIN ASPART 100 UNIT/ML ~~LOC~~ SOLN
0.0000 [IU] | SUBCUTANEOUS | Status: DC
  Administered 2011-01-13: 4 [IU] via SUBCUTANEOUS
  Administered 2011-01-13 – 2011-01-14 (×4): 2 [IU] via SUBCUTANEOUS
  Filled 2011-01-13: qty 3

## 2011-01-13 NOTE — Progress Notes (Signed)
1 Day Post-Op Procedure(s) (LRB): CORONARY ARTERY BYPASS GRAFTING (CABG) (N/A) Subjective: No complaints  Objective: Vital signs in last 24 hours: Temp:  [95.4 F (35.2 C)-99.5 F (37.5 C)] 99.3 F (37.4 C) (12/07 0800) Pulse Rate:  [80-90] 80  (12/07 0800) Cardiac Rhythm:  [-] Atrial paced (12/06 2227) Resp:  [0-35] 24  (12/07 0800) BP: (84-125)/(40-76) 122/42 mmHg (12/07 0800) SpO2:  [95 %-100 %] 98 % (12/07 0800) FiO2 (%):  [39.7 %-50.3 %] 40 % (12/06 1530) Weight:  [147.1 kg (324 lb 4.8 oz)] 324 lb 4.8 oz (147.1 kg) (12/07 0500)  Hemodynamic parameters for last 24 hours: PAP: (24-41)/(9-25) 40/17 mmHg CO:  [3.7 L/min-5.2 L/min] 5.2 L/min CI:  [1.5 L/min/m2-2.1 L/min/m2] 2.1 L/min/m2  Intake/Output from previous day: 12/06 0701 - 12/07 0700 In: 3837.7 [I.V.:2807.7; Blood:500; NG/GT:30; IV Piggyback:500] Out: 1835 [Urine:1265; Blood:300; Chest Tube:270] Intake/Output this shift:    General appearance: alert and cooperative Neurologic: intact Heart: regular rate and rhythm, S1, S2 normal, no murmur, click, rub or gallop Lungs: clear to auscultation bilaterally Extremities: edema mild bilateral leg Wound: dressings dry  Lab Results:  Basename 01/13/11 0410 01/12/11 1830  WBC 17.3* 18.9*  HGB 10.1* 10.4*  HCT 30.8* 31.7*  PLT 171 172   BMET:  Basename 01/13/11 0410 01/12/11 1830 01/12/11 1826 01/12/11 0526  NA 137 -- 139 --  K 4.2 -- 4.6 --  CL 106 -- 107 --  CO2 21 -- -- 23  GLUCOSE 118* -- 133* --  BUN 20 -- 18 --  CREATININE 0.72 0.66 -- --  CALCIUM 8.4 -- -- 9.3    PT/INR:  Basename 01/12/11 1220  LABPROT 16.2*  INR 1.27   ABG    Component Value Date/Time   PHART 7.359 01/12/2011 1647   HCO3 24.0 01/12/2011 1647   TCO2 22 01/12/2011 1826   ACIDBASEDEF 1.0 01/12/2011 1647   O2SAT 99.0 01/12/2011 1647   CBG (last 3)   Basename 01/13/11 0730 01/13/11 0632 01/13/11 0415  GLUCAP 94 99 131*    Assessment/Plan: S/P Procedure(s) (LRB): CORONARY  ARTERY BYPASS GRAFTING (CABG) (N/A) Mobilize Diuresis Diabetes control:  She was not on any medication preop but HgA1c was 6.8.  Will start Lantus and continue ss insulin.  She will probably need an oral agent when eating such as glucophage. d/c tubes/lines See progression orders   LOS: 8 days    Megan Atkinson K 01/13/2011

## 2011-01-13 NOTE — Progress Notes (Signed)
Patient ID: Megan Atkinson, female   DOB: 1950-05-17, 60 y.o.   MRN: [id number]  Stable day Up in chair CBG's well controlled

## 2011-01-14 ENCOUNTER — Inpatient Hospital Stay (HOSPITAL_COMMUNITY): Payer: No Typology Code available for payment source

## 2011-01-14 LAB — BASIC METABOLIC PANEL
BUN: 20 mg/dL (ref 6–23)
GFR calc non Af Amer: >90 mL/min (ref >90)
Glucose, Bld: 127 mg/dL — ABNORMAL HIGH (ref 70–99)
Potassium: 4.8 mEq/L (ref 3.5–5.1)

## 2011-01-14 LAB — CBC
HCT: 28.9 % — ABNORMAL LOW (ref 36.0–46.0)
Hemoglobin: 9.5 g/dL — ABNORMAL LOW (ref 12.0–15.0)
MCH: 30.4 pg (ref 26.0–34.0)
MCHC: 32.9 g/dL (ref 30.0–36.0)

## 2011-01-14 LAB — GLUCOSE, CAPILLARY: Glucose-Capillary: 139 mg/dL — ABNORMAL HIGH (ref 70–99)

## 2011-01-14 MED ORDER — MOVING RIGHT ALONG BOOK
Freq: Once | Status: AC
  Administered 2011-01-14: 16:00:00
  Filled 2011-01-14 (×2): qty 1

## 2011-01-14 MED ORDER — MAGNESIUM HYDROXIDE 400 MG/5ML PO SUSP: 30.0000 mL | Freq: Every day | ORAL | Status: DC | PRN

## 2011-01-14 MED ORDER — SODIUM CHLORIDE 0.9 % IJ SOLN: 3.0000 mL | INTRAMUSCULAR | Status: DC | PRN

## 2011-01-14 MED ORDER — BISACODYL 10 MG RE SUPP
10.0000 mg | Freq: Every day | RECTAL | Status: DC | PRN
  Administered 2011-01-16: 10 mg via RECTAL

## 2011-01-14 MED ORDER — POVIDONE-IODINE 10 % EX SOLN
1.0000 "application " | Freq: Two times a day (BID) | CUTANEOUS | Status: DC
  Filled 2011-01-14: qty 15

## 2011-01-14 MED ORDER — SODIUM CHLORIDE 0.9 % IV SOLN: 250.0000 mL | INTRAVENOUS | Status: DC | PRN

## 2011-01-14 MED ORDER — FUROSEMIDE 40 MG PO TABS
40.0000 mg | ORAL_TABLET | Freq: Every day | ORAL | Status: DC
  Administered 2011-01-14 – 2011-01-16 (×3): 40 mg via ORAL
  Filled 2011-01-14 (×3): qty 1

## 2011-01-14 MED ORDER — METOPROLOL SUCCINATE ER 25 MG PO TB24
25.0000 mg | ORAL_TABLET | Freq: Every day | ORAL | Status: DC
  Administered 2011-01-15 – 2011-01-16 (×2): 25 mg via ORAL
  Filled 2011-01-14 (×2): qty 1

## 2011-01-14 MED ORDER — ONDANSETRON HCL 4 MG PO TABS: 4.0000 mg | ORAL_TABLET | Freq: Four times a day (QID) | ORAL | Status: DC | PRN

## 2011-01-14 MED ORDER — ALUM & MAG HYDROXIDE-SIMETH 200-200-20 MG/5ML PO SUSP: 15.0000 mL | ORAL | Status: DC | PRN

## 2011-01-14 MED ORDER — SODIUM CHLORIDE 0.9 % IJ SOLN
3.0000 mL | Freq: Two times a day (BID) | INTRAMUSCULAR | Status: DC
  Administered 2011-01-15: 3 mL via INTRAVENOUS

## 2011-01-14 MED ORDER — ZOLPIDEM TARTRATE 5 MG PO TABS: 5.0000 mg | ORAL_TABLET | Freq: Every evening | ORAL | Status: DC | PRN

## 2011-01-14 MED ORDER — INSULIN ASPART 100 UNIT/ML ~~LOC~~ SOLN
0.0000 [IU] | Freq: Three times a day (TID) | SUBCUTANEOUS | Status: DC
  Administered 2011-01-14: 2 [IU] via SUBCUTANEOUS
  Administered 2011-01-14: 4 [IU] via SUBCUTANEOUS
  Administered 2011-01-15: 2 [IU] via SUBCUTANEOUS
  Filled 2011-01-14: qty 3

## 2011-01-14 MED ORDER — DOCUSATE SODIUM 100 MG PO CAPS
200.0000 mg | ORAL_CAPSULE | Freq: Every day | ORAL | Status: DC
  Administered 2011-01-15 – 2011-01-16 (×2): 200 mg via ORAL
  Filled 2011-01-14 (×2): qty 2

## 2011-01-14 MED ORDER — ONDANSETRON HCL 4 MG/2ML IJ SOLN: 4.0000 mg | Freq: Four times a day (QID) | INTRAMUSCULAR | Status: DC | PRN

## 2011-01-14 MED ORDER — POTASSIUM CHLORIDE CRYS ER 20 MEQ PO TBCR
20.0000 meq | EXTENDED_RELEASE_TABLET | Freq: Two times a day (BID) | ORAL | Status: DC
  Administered 2011-01-14 – 2011-01-16 (×4): 20 meq via ORAL
  Filled 2011-01-14 (×5): qty 1

## 2011-01-14 MED ORDER — ALPRAZOLAM 0.25 MG PO TABS: 0.2500 mg | ORAL_TABLET | Freq: Four times a day (QID) | ORAL | Status: DC | PRN

## 2011-01-14 MED ORDER — BISACODYL 5 MG PO TBEC: 10.0000 mg | DELAYED_RELEASE_TABLET | Freq: Every day | ORAL | Status: DC | PRN

## 2011-01-14 MED ORDER — GUAIFENESIN-DM 100-10 MG/5ML PO SYRP: 15.0000 mL | ORAL_SOLUTION | ORAL | Status: DC | PRN

## 2011-01-14 NOTE — Progress Notes (Signed)
Pt transferred via wheelchair to 2016 on monitor.  Pt tolerated well.  VSS.  Report given via phone and at bedside to [facility], [place].

## 2011-01-14 NOTE — Progress Notes (Signed)
2 Days Post-Op Procedure(s) (LRB): CORONARY ARTERY BYPASS GRAFTING (CABG) (N/A) Subjective: Feels well this AM, only mild pain, no nausea or SOB  Objective: Vital signs in last 24 hours: Temp:  [98.5 F (36.9 C)-99.9 F (37.7 C)] 98.5 F (36.9 C) (12/08 0739) Pulse Rate:  [74-95] 94  (12/08 0800) Cardiac Rhythm:  [-] Normal sinus rhythm (12/08 0800) Resp:  [17-33] 23  (12/08 0800) BP: (80-148)/(40-79) 100/40 mmHg (12/08 0800) SpO2:  [85 %-100 %] 100 % (12/08 0800) Weight:  [145.6 kg (320 lb 15.8 oz)-145.605 kg (321 lb)] 320 lb 15.8 oz (145.6 kg) (12/08 0500)  Hemodynamic parameters for last 24 hours: PAP: (42-44)/(14-17) 42/14 mmHg  Intake/Output from previous day: 12/07 0701 - 12/08 0700 In: 1324 [P.O.:720; I.V.:500; IV Piggyback:104] Out: 3035 [Urine:2985; Chest Tube:50] Intake/Output this shift: Total I/O In: 20 [I.V.:20] Out: 100 [Urine:100]  General appearance: alert and no distress Heart: regular rate and rhythm Lungs: diminished breath sounds bibasilar  Lab Results:  Basename 01/14/11 0410 01/13/11 1707 01/13/11 1700  WBC 15.5* -- 17.2*  HGB 9.5* 10.5* --  HCT 28.9* 31.0* --  PLT 189 -- 177   BMET:  Basename 01/14/11 0410 01/13/11 1707 01/13/11 0410  NA 135 137 --  K 4.8 4.7 --  CL 103 103 --  CO2 25 -- 21  GLUCOSE 127* 136* --  BUN 20 21 --  CREATININE 0.71 0.80 --  CALCIUM 8.4 -- 8.4    PT/INR:  Basename 01/12/11 1220  LABPROT 16.2*  INR 1.27   ABG    Component Value Date/Time   PHART 7.359 01/12/2011 1647   HCO3 24.0 01/12/2011 1647   TCO2 23 01/13/2011 1707   ACIDBASEDEF 1.0 01/12/2011 1647   O2SAT 99.0 01/12/2011 1647   CBG (last 3)   Basename 01/14/11 0737 01/14/11 0418 01/13/11 1913  GLUCAP 129* 127* 161*    Assessment/Plan: S/P Procedure(s) (LRB): CORONARY ARTERY BYPASS GRAFTING (CABG) (N/A) Mobilize Diuresis Diabetes control Plan for transfer to step-down: see transfer orders CBGs well controlled   LOS: 9 days     HENDRICKSON,STEVEN C 01/14/2011

## 2011-01-15 LAB — BASIC METABOLIC PANEL
CO2: 26 mEq/L (ref 19–32)
GFR calc non Af Amer: >90 mL/min (ref >90)
Glucose, Bld: 107 mg/dL — ABNORMAL HIGH (ref 70–99)
Potassium: 4.4 mEq/L (ref 3.5–5.1)
Sodium: 138 mEq/L (ref 135–145)

## 2011-01-15 LAB — GLUCOSE, CAPILLARY
Glucose-Capillary: 103 mg/dL — ABNORMAL HIGH (ref 70–99)
Glucose-Capillary: 129 mg/dL — ABNORMAL HIGH (ref 70–99)

## 2011-01-15 LAB — TYPE AND SCREEN
ABO/RH(D): AB POS
Antibody Screen: NEGATIVE
Unit division: 0
Unit division: 0

## 2011-01-15 LAB — CBC
Hemoglobin: 8.5 g/dL — ABNORMAL LOW (ref 12.0–15.0)
MCH: 29.4 pg (ref 26.0–34.0)
MCV: 93.1 fL (ref 78.0–100.0)
RBC: 2.89 MIL/uL — ABNORMAL LOW (ref 3.87–5.11)
WBC: 14.6 10*3/uL — ABNORMAL HIGH (ref 4.0–10.5)

## 2011-01-15 MED ORDER — ROSUVASTATIN CALCIUM 20 MG PO TABS: 20.0000 mg | ORAL_TABLET | Freq: Every day | ORAL | Status: DC

## 2011-01-15 MED ORDER — FUROSEMIDE 40 MG PO TABS: 40.0000 mg | ORAL_TABLET | Freq: Every day | ORAL | Status: DC

## 2011-01-15 MED ORDER — METFORMIN HCL 500 MG PO TABS
500.0000 mg | ORAL_TABLET | Freq: Two times a day (BID) | ORAL | Status: DC
  Administered 2011-01-15 – 2011-01-16 (×2): 500 mg via ORAL
  Filled 2011-01-15 (×5): qty 1

## 2011-01-15 MED ORDER — OXYCODONE HCL 5 MG PO TABS: 5.0000 mg | ORAL_TABLET | ORAL | Status: AC | PRN

## 2011-01-15 MED ORDER — POTASSIUM CHLORIDE CRYS ER 20 MEQ PO TBCR: 20.0000 meq | EXTENDED_RELEASE_TABLET | Freq: Every day | ORAL | Status: DC

## 2011-01-15 MED ORDER — METOPROLOL SUCCINATE ER 25 MG PO TB24: 25.0000 mg | ORAL_TABLET | Freq: Every day | ORAL | Status: DC

## 2011-01-15 MED ORDER — METFORMIN HCL 500 MG PO TABS: 500.0000 mg | ORAL_TABLET | Freq: Two times a day (BID) | ORAL | Status: DC

## 2011-01-15 NOTE — Discharge Summary (Signed)
Discharge Summary  Name: Megan Atkinson DOB: 1950-09-10 60 y.o. MRN: [id number]  Admission Date: 01/05/2011 Discharge Date:    Admitting Diagnosis: Active Problems:  CAD (coronary artery disease)  Hypertension   Discharge Diagnosis:  Active Problems:  CAD (coronary artery disease)  Hypertension newly diagnosed diabetes mellitus  Procedures: Procedure(s): CORONARY ARTERY BYPASS GRAFTING (CABG) x1(left internal mammary artery to the LAD) endoscopic vein harvest right leg on 01/12/2011   HPI:  The patient is a 60 y.o. female with no previous history of coronary artery disease. She describes a long progressive history of worsening exertional shortness of breath and the recent development of symptoms of right-sided chest pressure with physical activity consistent with angina pectoris. These symptoms are always brought on with physical activity and relieved by rest. She denies any chest discomfort occurring at rest. She denies any shortness of breath at rest. She denies PND, orthopnea, or lower extremity edema. She awoke on the day prior to admission feeling particularly week and short of breath. She went to the emergency room where she was noted to have severe hypertension. She was admitted to the hospital for further evaluation.   Hospital Course:  The patient was admitted to [facility] on 01/05/2011. She ruled out for acute myocardial infarction by serial cardiac enzymes. She underwent cardiac catheterization by Dr. Riley Kill. She was found to have severe two-vessel coronary artery disease with preserved left ventricular function. Cardiothoracic surgical consultation was requested. She was initially seen by Dr. Cornelius Moras, and CABG was recommended. The patient agreed to proceed.  Because Dr. Cornelius Moras was scheduled to be out of town, Dr. Laneta Simmers was able to accommodate the patient on his OR schedule. He saw her prior to surgery and agreed with Dr. Orvan July initial assessment.  All risks, benefits and  alternatives of surgery were explained in detail, and the patient agreed to proceed.  The patient was taken to the operating room and underwent the above procedure. Intraoperatively the saphenous vein was fairly small and was not felt to be suitable as a conduit. Also very distal right coronary artery was diffusely diseased and thought not to be graftable. Please see previously dictated operative report for complete details. The postoperative course was generally uneventful.  She was started on Lantus for elevated blood sugars and was subsequently switched to oral Glucophage. Her preoperative hemoglobin A1c was 6 and it was felt that she would require  outpatient monitoring. She has been started on a beta blocker and is tolerating this without problem. She's remained afebrile and in normal sinus rhythm. She's tolerating a regular diet. She has been started on Lasix and is diuresing well. We anticipate discharge home within the next 24 hours if she remains stable.   Recent vital signs:  Filed Vitals:   01/15/11 0500  BP: 141/70  Pulse: 93  Temp: 99.1 F (37.3 C)  Resp: 18    Recent laboratory studies:  CBC: Basename 01/15/11 0600 01/14/11 0410  WBC 14.6* 15.5*  HGB 8.5* 9.5*  HCT 26.9* 28.9*  PLT 204 189   BMET:  Basename 01/15/11 0600 01/14/11 0410  NA 138 135  K 4.4 4.8  CL 105 103  CO2 26 25  GLUCOSE 107* 127*  BUN 22 20  CREATININE 0.69 0.71  CALCIUM 8.5 8.4    PT/INR:  Basename 01/12/11 1220  LABPROT 16.2*  INR 1.27    Discharge Medications:  Current Discharge Medication List    START taking these medications   Details  furosemide (LASIX) 40 MG tablet  Take 1 tablet (40 mg total) by mouth daily. Qty: 7 tablet, Refills: 0    metFORMIN (GLUCOPHAGE) 500 MG tablet Take 1 tablet (500 mg total) by mouth 2 (two) times daily with a meal. Qty: 60 tablet, Refills: 1    metoprolol succinate (TOPROL-XL) 25 MG 24 hr tablet Take 1 tablet (25 mg total) by mouth daily. Qty: 30  tablet, Refills: 1    oxyCODONE (OXY IR/ROXICODONE) 5 MG immediate release tablet Take 1-2 tablets (5-10 mg total) by mouth every 3 (three) hours as needed for pain. Qty: 30 tablet, Refills: 0    potassium chloride SA (K-DUR,KLOR-CON) 20 MEQ tablet Take 1 tablet (20 mEq total) by mouth daily. Qty: 7 tablet, Refills: 0    rosuvastatin (CRESTOR) 20 MG tablet Take 1 tablet (20 mg total) by mouth daily. Qty: 30 tablet, Refills: 1      CONTINUE these medications which have NOT CHANGED   Details  aspirin 325 MG tablet Take 325 mg by mouth daily.      Ginkgo 60 MG TABS Take 2 tablets by mouth daily. For memory     glucosamine-chondroitin 500-400 MG tablet Take 1 tablet by mouth daily.      Green Tea, Camillia sinensis, 1000 MG TABS Take 1 tablet by mouth daily.     Misc Natural Products (7-KETO LEAN) CAPS Take 1 tablet by mouth 2 (two) times daily. For menopause symptons     Misc Natural Products (PRO HERBS RELAX/EASE TENSION PO) Take by mouth. ashwagandah tab       STOP taking these medications     Aspirin-Phytosterols (BAYER HEART ADVANTAGE) 81-400 MG TABS         Discharge Instructions:  The patient is to refrain from driving, heavy lifting or strenuous activity.  May shower daily and clean incisions with soap and water.  May resume regular diet.    Follow-up Information    Follow up with Shawnie Pons, MD. Make an appointment in 2 weeks.   Contact information:   [address] [place] [address] [phone]       Follow up with Alleen Borne, MD in 3 weeks. (office will arrange)    Contact information:   [address] [place] [address] [phone]       Make an appointment to follow up.      Please follow up. (See your medical doctor in 1-2 weeks for follow up of diabetes)           COLLINS,GINA H 01/15/2011, 11:45 AM

## 2011-01-15 NOTE — Progress Notes (Signed)
3 Days Post-Op Procedure(s) (LRB): CORONARY ARTERY BYPASS GRAFTING (CABG) (N/A)  Subjective: Feeling well.  Walking in halls without problem.  Breathing stable, no pain.  Objective: Vital signs in last 24 hours: Patient Vitals for the past 24 hrs:  BP Temp Temp src Pulse Resp SpO2 Weight  01/15/11 0500 141/70 mmHg 99.1 F (37.3 C) Oral 93  18  93 % 145.6 kg (320 lb 15.8 oz)  01/14/11 2117 149/69 mmHg 97.7 F (36.5 C) Oral 95  19  93 % -  01/14/11 2000 128/54 mmHg - - 90  23  91 % -  01/14/11 1933 - 98 F (36.7 C) Oral - - - -  01/14/11 1900 - - - 91  24  89 % -  01/14/11 1800 111/64 mmHg - - - 20  - -  01/14/11 1700 87/53 mmHg - - - 21  - -  01/14/11 1600 127/51 mmHg - - 93  25  89 % -  01/14/11 1542 - 99 F (37.2 C) Oral - - - -  01/14/11 1400 - - - 84  23  88 % -  01/14/11 1300 112/51 mmHg - - 80  26  98 % -  01/14/11 1200 97/42 mmHg - - 81  22  100 % -  01/14/11 1134 - 99 F (37.2 C) Oral - - - -  01/14/11 1100 126/45 mmHg - - 98  22  96 % -   Current Weight  01/15/11 145.6 kg (320 lb 15.8 oz)     Intake/Output from previous day: 12/08 0701 - 12/09 0700 In: 980 [P.O.:720; I.V.:260] Out: 725 [Urine:725]    PHYSICAL EXAM:  Heart: RRR Lungs: clear Wound: clean and dry Extremities: mild LE edema  Lab Results: CBC: Basename 01/15/11 0600 01/14/11 0410  WBC 14.6* 15.5*  HGB 8.5* 9.5*  HCT 26.9* 28.9*  PLT 204 189   BMET:  Basename 01/15/11 0600 01/14/11 0410  NA 138 135  K 4.4 4.8  CL 105 103  CO2 26 25  GLUCOSE 107* 127*  BUN 22 20  CREATININE 0.69 0.71  CALCIUM 8.5 8.4    PT/INR:  Basename 01/12/11 1220  LABPROT 16.2*  INR 1.27   CBGs- 124-10-107  Assessment/Plan: S/P Procedure(s) (LRB): CORONARY ARTERY BYPASS GRAFTING (CABG) (N/A) Pulm toilet, wean O2 CRPI DM- Will start oral agent and d/c Lantus CV- stable, continue Toprol Vol overload-diurese Poss home 1-2 days if stable   LOS: 10 days    COLLINS,GINA H 01/15/2011

## 2011-01-16 LAB — GLUCOSE, CAPILLARY

## 2011-01-16 MED FILL — Magnesium Sulfate Inj 50%: INTRAMUSCULAR | Qty: 10 | Status: AC

## 2011-01-16 MED FILL — Potassium Chloride Inj 2 mEq/ML: INTRAVENOUS | Qty: 40 | Status: AC

## 2011-01-16 NOTE — Progress Notes (Signed)
D/C EPW and CTS per MD order and hospital policy.  All ends intact. No signs of bleeding.  Bedrest for 1 hr.  Will continue to monitor.

## 2011-01-16 NOTE — Progress Notes (Signed)
4 Days Post-Op Procedure(s) (LRB): CORONARY ARTERY BYPASS GRAFTING (CABG) (N/A)  Subjective: Patient feels fairly well. Hopes to go home.  Objective: Vital signs in last 24 hours: Patient Vitals for the past 24 hrs:  BP Temp Temp src Pulse Resp SpO2 Weight  01/16/11 0524 136/71 mmHg 98.4 F (36.9 C) Oral 91  18  92 % 317 lb 7.4 oz (144 kg)  01/15/11 2205 149/64 mmHg 99 F (37.2 C) Oral 92  18  93 % -   Pre op weight  149 kg Current Weight  01/16/11 317 lb 7.4 oz (144 kg)       Intake/Output from previous day: 12/09 0701 - 12/10 0700 In: 960 [P.O.:960] Out: 1100 [Urine:1100]   Physical Exam:  Cardiovascular: RRR, no murmurs, gallops, or rubs. Pulmonary: Clear to auscultation bilaterally; no rales, wheezes, or rhonchi. Abdomen: Soft, non tender, bowel sounds present. Extremities: Mild bilateral lower extremity edema. Wounds: Clean and dry.  No erythema or signs of infection.  Lab Results: CBC: Basename 01/15/11 0600 01/14/11 0410  WBC 14.6* 15.5*  HGB 8.5* 9.5*  HCT 26.9* 28.9*  PLT 204 189   BMET:  Basename 01/15/11 0600 01/14/11 0410  NA 138 135  K 4.4 4.8  CL 105 103  CO2 26 25  GLUCOSE 107* 127*  BUN 22 20  CREATININE 0.69 0.71  CALCIUM 8.5 8.4    PT/INR: No results found for this basename: LABPROT,INR in the last 72 hours ABG:  INR: Will add last result for INR, ABG once components are confirmed Will add last 4 CBG results once components are confirmed  Assessment/Plan:  1. CV - SR.Continue with Toprol XL 25 daily. 2.  Pulmonary - Stable 3. Volume Overload - Continue with diuresis. 4.  Acute blood loss anemia - Last H/H 8.5/26.9. 5.DM-CBGs 118/122/100. Continue Metformin. Pre op HGA1C 6.4. Will need follow up as outpatient. 6.Remove EPW and ct sutures. 7.Given suppository as has not had a bowel movement yet. 8.Likely d/c later today.   Ardelle Balls, PA 01/16/2011

## 2011-01-16 NOTE — Progress Notes (Addendum)
CARDIAC REHAB PHASE I   PRE:  Rate/Rhythm: 98 Sr    BP: sitting 167/74    SaO2: 94 RA  MODE:  Ambulation: 300 ft   POST:  Rate/Rhythm: 108 ST    BP: sitting 142/79     SaO2: 89-91 RA  Pt tolerated fair. Needed multiple rest breaks, every 20-30 ft,  due to DOE. Motivated. VSS. SaO2 borderline but ok. Encouraged IS/flutter. Ed completed and pt very interested in CRPII to [place]. Will send referral. Pt would benefit from RW for home. [phone]  Harriet Masson CES, ACSM

## 2011-01-16 NOTE — Progress Notes (Signed)
   CARE MANAGEMENT NOTE 01/16/2011  Patient:  Veasey,Kamiah   Account Number:  [id number]  Date Initiated:  01/12/2011  Documentation initiated by:  Avie Arenas  Subjective/Objective Assessment:   CP - NSTEMI - now post op CABG.     Action/Plan:   PTA, PT INDEPENDENT, LIVES WITH SPOUSE.   Anticipated DC Date:  01/17/2011   Anticipated DC Plan:  HOME W HOME HEALTH SERVICES      DC Planning Services  CM consult      Choice offered to / List presented to:     DME arranged  Levan Hurst      DME agency  [facility]        Status of service:  Completed, signed off Medicare Important Message given?   (If response is "NO", the following Medicare IM given date fields will be blank) Date Medicare IM given:   Date Additional Medicare IM given:    Discharge Disposition:  HOME/SELF CARE  Per UR Regulation:  Reviewed for med. necessity/level of care/duration of stay  Comments:  01/16/11 Else Habermann,RN,BSN 1100 PT FOR DISCHARGE HOME TODAY.  NEEDS WIDE RW FOR HOME. REFERRAL TO [facility] WITH [facility] FOR DME NEEDS.  DENIES OTHER HOME NEEDS...WILL DC HOME WITH SPOUSE AS CAREGIVER. Phone #[phone]  01-12-11 2:35pm Avie Arenas, RNBSN - [phone] UR Completed.

## 2011-01-17 LAB — POCT I-STAT 3, ART BLOOD GAS (G3+)
pCO2 arterial: 40.7 mmHg (ref 35.0–45.0)
pH, Arterial: 7.353 (ref 7.350–7.400)
pO2, Arterial: 108 mmHg — ABNORMAL HIGH (ref 80.0–100.0)

## 2011-01-17 LAB — POCT I-STAT 4, (NA,K, GLUC, HGB,HCT)
HCT: 33 % — ABNORMAL LOW (ref 36.0–46.0)
Hemoglobin: 11.2 g/dL — ABNORMAL LOW (ref 12.0–15.0)
Potassium: 4.2 mEq/L (ref 3.5–5.1)
Sodium: 139 mEq/L (ref 135–145)

## 2011-01-17 MED FILL — Electrolyte-R (PH 7.4) Solution: INTRAVENOUS | Qty: 1000 | Status: AC

## 2011-01-17 MED FILL — Sodium Chloride IV Soln 0.9%: INTRAVENOUS | Qty: 1000 | Status: AC

## 2011-01-17 MED FILL — Sodium Chloride Irrigation Soln 0.9%: Qty: 3000 | Status: AC

## 2011-01-17 MED FILL — Heparin Sodium (Porcine) Inj 1000 Unit/ML: INTRAMUSCULAR | Qty: 60 | Status: AC

## 2011-01-27 ENCOUNTER — Other Ambulatory Visit: Payer: Self-pay | Admitting: Surgery

## 2011-01-27 DIAGNOSIS — I2510 Atherosclerotic heart disease of native coronary artery without angina pectoris: Secondary | ICD-10-CM

## 2011-02-06 ENCOUNTER — Ambulatory Visit (INDEPENDENT_AMBULATORY_CARE_PROVIDER_SITE_OTHER): Payer: Self-pay | Admitting: Physician Assistant

## 2011-02-06 ENCOUNTER — Ambulatory Visit
Admission: RE | Admit: 2011-02-06 | Discharge: 2011-02-06 | Disposition: A | Discharge status: Home / Self Care | Payer: PRIVATE HEALTH INSURANCE | Source: Ambulatory Visit | Attending: Surgery | Admitting: Surgery

## 2011-02-06 VITALS — BP 142/70 | HR 90 | Resp 18

## 2011-02-06 DIAGNOSIS — Z09 Encounter for follow-up examination after completed treatment for conditions other than malignant neoplasm: Secondary | ICD-10-CM

## 2011-02-06 DIAGNOSIS — I2510 Atherosclerotic heart disease of native coronary artery without angina pectoris: Secondary | ICD-10-CM

## 2011-02-06 NOTE — Progress Notes (Signed)
HPI:  Patient returns for routine postoperative follow-up having undergone CABGx1 (LIMA to LAD) on 01/12/2011. The patient's early postoperative recovery while in the hospital was uneventful. She was found to have diabetes on this admission (HGA1C 6.4) and was placed on Metformin 500 mg bid. Since hospital discharge the patient denies chest pain, shortness of breath, fever, or chills. She has no real complaints at this time.   Current Outpatient Prescriptions  Medication Sig Dispense Refill  . aspirin 325 MG tablet Take 325 mg by mouth daily.        . furosemide (LASIX) 40 MG tablet Take 1 tablet (40 mg total) by mouth daily.  7 tablet  0  . Ginkgo 60 MG TABS Take 2 tablets by mouth daily. For memory       . glucosamine-chondroitin 500-400 MG tablet Take 1 tablet by mouth daily.        Chilton Si Tea, Camillia sinensis, 1000 MG TABS Take 1 tablet by mouth daily.       . metFORMIN (GLUCOPHAGE) 500 MG tablet Take 1 tablet (500 mg total) by mouth 2 (two) times daily with a meal.  60 tablet  1  . metoprolol succinate (TOPROL-XL) 25 MG 24 hr tablet Take 1 tablet (25 mg total) by mouth daily.  30 tablet  1  . Misc Natural Products (7-KETO LEAN) CAPS Take 1 tablet by mouth 2 (two) times daily. For menopause symptons       . Misc Natural Products (PRO HERBS RELAX/EASE TENSION PO) Take by mouth. ashwagandah tab       . oxycodone (OXY-IR) 5 MG capsule Take 5 mg by mouth every 4 (four) hours as needed.        . potassium chloride SA (K-DUR,KLOR-CON) 20 MEQ tablet Take 1 tablet (20 mEq total) by mouth daily.  7 tablet  0  . rosuvastatin (CRESTOR) 20 MG tablet Take 1 tablet (20 mg total) by mouth daily.  30 tablet  1   Vital Signs: BP 142/70,RR 18,O2 Sat 96% RA, and HR 90.  Physical Exam: Cardiovascular: Regular rate and rhythm; S1-S2 without any murmurs, gallops, or rubs. Pulmonary: Clear to auscultation bilaterally. No rales, wheezes, or rhonchi. Wounds: Sternum solid. Wound is well healed (clean  and dry). Right lower leg incision is clean and dry with a small eschar.  Diagnostic Tests: Chest x-ray done today shows improvement in aeration, minimal residual atelectasis at the left base, and no pneumothorax or pleural effusions seen  Impression and Plan: Overall, the patient is continuing to progress well status post CABG x1. She is only using narcotics prior to bedtime for pain. She was instructed she may begin driving short distances  i.e. 30 minutes or less during the day. She may then gradually increase her frequency and  duration as tolerates. She was also instructed and encouraged to purchase a patent cardiac rehabilitation. Finally, she was instructed she is to continue with sternal precautions (no lifting more than 10 pounds for the next 4-6 weeks.) She did inquire about her return to work. She was instructed she would be surgically cleared by about the first week of February. She has not seen a cardiologist in followup. I have arranged a followup appointment with Dr. Ludwig Clarks physician's assistant on 02/22/2011 at 9:30 AM. The patient was also instructed she needs to obtain a primary care physician to continue to follow her diabetes management and hemoglobin A1c. She will  return to see Dr. Laneta Simmers on an as  necessary basis.

## 2011-02-17 ENCOUNTER — Encounter: Payer: Self-pay | Admitting: Physician Assistant

## 2011-02-21 ENCOUNTER — Encounter: Payer: Self-pay | Admitting: Physician Assistant

## 2011-02-22 ENCOUNTER — Encounter: Payer: Self-pay | Admitting: Physician Assistant

## 2011-02-22 ENCOUNTER — Ambulatory Visit (INDEPENDENT_AMBULATORY_CARE_PROVIDER_SITE_OTHER): Payer: PRIVATE HEALTH INSURANCE | Admitting: Physician Assistant

## 2011-02-22 ENCOUNTER — Encounter: Payer: Self-pay | Admitting: *Deleted

## 2011-02-22 VITALS — BP 169/99 | HR 95 | Resp 18 | Ht 67.0 in | Wt 306.4 lb

## 2011-02-22 DIAGNOSIS — E785 Hyperlipidemia, unspecified: Secondary | ICD-10-CM | POA: Insufficient documentation

## 2011-02-22 DIAGNOSIS — R7302 Impaired glucose tolerance (oral): Secondary | ICD-10-CM

## 2011-02-22 DIAGNOSIS — R7309 Other abnormal glucose: Secondary | ICD-10-CM

## 2011-02-22 DIAGNOSIS — E1165 Type 2 diabetes mellitus with hyperglycemia: Secondary | ICD-10-CM | POA: Insufficient documentation

## 2011-02-22 DIAGNOSIS — E119 Type 2 diabetes mellitus without complications: Secondary | ICD-10-CM | POA: Insufficient documentation

## 2011-02-22 DIAGNOSIS — I2510 Atherosclerotic heart disease of native coronary artery without angina pectoris: Secondary | ICD-10-CM

## 2011-02-22 DIAGNOSIS — I10 Essential (primary) hypertension: Secondary | ICD-10-CM

## 2011-02-22 MED ORDER — LISINOPRIL 10 MG PO TABS: 10.0000 mg | ORAL_TABLET | Freq: Every day | ORAL | Status: DC

## 2011-02-22 NOTE — Assessment & Plan Note (Signed)
Doing well post CABG.  Continue aspirin and statin.  Refer to cardiac rehabilitation.  Follow up with Dr. Jens Som in 8 weeks.

## 2011-02-22 NOTE — Progress Notes (Signed)
[address] [place], [place]  [address] Phone: [phone] Fax:  [phone]  Date:  02/22/2011   Name:  Megan Atkinson       DOB:  1950/02/15 MRN:  [id number]  PCP:  None  Primary Cardiologist:  Dr. Olga Millers  Primary Electrophysiologist:  None    History of Present Illness: Megan Atkinson is a 61 y.o. female who presents for post hospital follow up.  She was admitted 11/29-12/9 with an NSTEMI.  She presented with exertional dyspnea and right-sided chest pressure.  Cardiac markers were minimally elevated.  LHC 01/06/11: LAD 90-95%, mid D1 90%, proximal D2 50%, proximal OM 150%, RCA occluded, EF greater than 65%.  She was referred for bypass.  This was done with Dr. Laneta Simmers 01/12/11: LIMA-LAD.  The distal RCA was diffusely diseased and not thought to be graftable.  Saphenous vein was fairly small and not felt suitable as a conduit.  Her sugars were elevated postoperatively.  Her A1c was somewhat elevated and she was placed on metformin.  She maintained sinus rhythm.  Labs:  Potassium 4.4, creatinine 0.69, TT 212, TG 113, HDL 46, LDL 143,Hemoglobin 8.5, TSH 2.187, Hemoglobin A1c 6.4.  Echocardiogram 01/06/11: EF 65%.  3 CABG Dopplers: No ICA stenosis.  Doing well.  The patient denies chest pain, shortness of breath, syncope, orthopnea, PND or significant pedal edema. She wants to go to card rehab.  Also eager to get back to work.    Past Medical History  Diagnosis Date  . Obesity   . Hyperlipidemia   . CAD (coronary artery disease)     small NSTEMI 11/12: LHC 01/06/11:  LAD 90-95%, mD1 90%, pD2 50%,  pOM1 50%, RCA occluded,  EF > 65%;  CABG  12/12: L-LAD  . Glucose intolerance (impaired glucose tolerance)     Hemoglobin A1c 6.4 in 12/2010  . HTN (hypertension)     Echo 01/06/11: EF 65%    Current Outpatient Prescriptions  Medication Sig Dispense Refill  . aspirin 325 MG tablet Take 325 mg by mouth daily.        . Ginkgo 60 MG TABS Take 2 tablets by  mouth daily. For memory       . glucosamine-chondroitin 500-400 MG tablet Take 1 tablet by mouth daily.        Chilton Si Tea, Camillia sinensis, 1000 MG TABS Take 1 tablet by mouth daily.       . metFORMIN (GLUCOPHAGE) 500 MG tablet Take 1 tablet (500 mg total) by mouth 2 (two) times daily with a meal.  60 tablet  1  . metoprolol succinate (TOPROL-XL) 25 MG 24 hr tablet Take 1 tablet (25 mg total) by mouth daily.  30 tablet  1  . Misc Natural Products (7-KETO LEAN) CAPS Take 1 tablet by mouth 2 (two) times daily. For menopause symptons       . Misc Natural Products (PRO HERBS RELAX/EASE TENSION PO) Take by mouth. ashwagandah tab       . oxycodone (OXY-IR) 5 MG capsule Take 5 mg by mouth every 4 (four) hours as needed.        . rosuvastatin (CRESTOR) 20 MG tablet Take 1 tablet (20 mg total) by mouth daily.  30 tablet  1  . furosemide (LASIX) 40 MG tablet Take 1 tablet (40 mg total) by mouth daily.  7 tablet  0  . potassium chloride SA (K-DUR,KLOR-CON) 20 MEQ tablet Take 1 tablet (20 mEq total) by mouth daily.  7 tablet  0    Allergies: No Known Allergies  History  Substance Use Topics  . Smoking status: Never Smoker   . Smokeless tobacco: Not on file  . Alcohol Use: Yes     Occasional     ROS:  Please see the history of present illness.   All other systems reviewed and negative.   PHYSICAL EXAM: VS:  BP 169/99  Pulse 95  Resp 18  Ht 5\' 7"  (1.702 m)  Wt 306 lb 6.4 oz (138.982 kg)  BMI 47.99 kg/m2 Repeat blood pressure by me 140/90 Well nourished, well developed, in no acute distress HEENT: normal Neck: no JVD Cardiac:  normal S1, S2; RRR; no murmur Chest: Median sternotomy well healed without erythema or discharge Lungs:  clear to auscultation bilaterally, no wheezing, rhonchi or rales Abd: soft, nontender, no hepatomegaly Ext: no edema Skin: warm and dry Neuro:  CNs 2-12 intact, no focal abnormalities noted  EKG:   Sinus rhythm, heart rate 89, normal axis, T wave inversions  in 1, 2, aVL, No significant change since prior tracings  ASSESSMENT AND PLAN:

## 2011-02-22 NOTE — Assessment & Plan Note (Signed)
Check lipids and LFTs in one week.

## 2011-02-22 NOTE — Patient Instructions (Addendum)
Your physician recommends that you schedule a follow-up appointment in: 8 weeks with Dr Jens Som Your physician recommends that you return for lab work in: 1 week (BMP, Lipid and Liver profiles) Your physician has recommended you make the following change in your medication: START Lisinopril 10 mg daily You have been referred to Primary Care at [facility] for Glucose Intolerance You have been referred to Cardiac Rehab at [facility]

## 2011-02-22 NOTE — Assessment & Plan Note (Signed)
Blood pressure elevated.  Start lisinopril 10 mg daily.  Followup basic metabolic panel in one week.

## 2011-02-22 NOTE — Assessment & Plan Note (Signed)
Continue metformin.  Refer to Primary care.

## 2011-03-01 ENCOUNTER — Ambulatory Visit (INDEPENDENT_AMBULATORY_CARE_PROVIDER_SITE_OTHER): Payer: PRIVATE HEALTH INSURANCE | Admitting: *Deleted

## 2011-03-01 DIAGNOSIS — R7309 Other abnormal glucose: Secondary | ICD-10-CM

## 2011-03-01 DIAGNOSIS — E785 Hyperlipidemia, unspecified: Secondary | ICD-10-CM

## 2011-03-01 DIAGNOSIS — R7302 Impaired glucose tolerance (oral): Secondary | ICD-10-CM

## 2011-03-01 DIAGNOSIS — I2510 Atherosclerotic heart disease of native coronary artery without angina pectoris: Secondary | ICD-10-CM

## 2011-03-01 LAB — BASIC METABOLIC PANEL
BUN: 13 mg/dL (ref 6–23)
CO2: 25 mEq/L (ref 19–32)
Calcium: 8.9 mg/dL (ref 8.4–10.5)
GFR: 107 mL/min (ref >60.00)
Glucose, Bld: 100 mg/dL — ABNORMAL HIGH (ref 70–99)
Potassium: 3.9 mEq/L (ref 3.5–5.1)

## 2011-03-01 LAB — HEPATIC FUNCTION PANEL
Albumin: 3.6 g/dL (ref 3.5–5.2)
Total Protein: 7.2 g/dL (ref 6.0–8.3)

## 2011-03-01 LAB — LIPID PANEL
Cholesterol: 141 mg/dL (ref 0–200)
HDL: 49.7 mg/dL (ref >39.00)
VLDL: 17 mg/dL (ref 0.0–40.0)

## 2011-03-06 ENCOUNTER — Telehealth: Payer: Self-pay | Admitting: *Deleted

## 2011-03-06 ENCOUNTER — Encounter: Payer: Self-pay | Admitting: Physician Assistant

## 2011-03-06 NOTE — Telephone Encounter (Signed)
lmom for pt lab ok, pt needs to est w/PCP for HTN. Danielle Rankin

## 2011-03-06 NOTE — Patient Instructions (Signed)
error 

## 2011-03-06 NOTE — Telephone Encounter (Signed)
pt notified of lab results and I set up pt today to see Dr. Yetta Barre @ [facility] for HTN per Tereso Newcomer, PA-C. Danielle Rankin

## 2011-03-06 NOTE — Patient Instructions (Signed)
S/w pt today and I called primary care to set up pt with PCP. Pt is now scheduled with Dr. Marcello Moores 04/05/11 @ 10:30 for blood pressure per Tereso Newcomer, PA-C. Danielle Rankin

## 2011-03-15 ENCOUNTER — Other Ambulatory Visit: Payer: Self-pay | Admitting: Cardiology

## 2011-03-15 MED ORDER — ROSUVASTATIN CALCIUM 20 MG PO TABS: 20.0000 mg | ORAL_TABLET | Freq: Every day | ORAL | Status: DC

## 2011-03-15 MED ORDER — METOPROLOL SUCCINATE ER 25 MG PO TB24: 25.0000 mg | ORAL_TABLET | Freq: Every day | ORAL | Status: DC

## 2011-03-15 NOTE — Telephone Encounter (Signed)
Pt has 3 pills needs refill called in asap

## 2011-04-05 ENCOUNTER — Encounter: Payer: Self-pay | Admitting: Internal Medicine

## 2011-04-05 ENCOUNTER — Ambulatory Visit (INDEPENDENT_AMBULATORY_CARE_PROVIDER_SITE_OTHER): Payer: PRIVATE HEALTH INSURANCE | Admitting: Internal Medicine

## 2011-04-05 ENCOUNTER — Other Ambulatory Visit (INDEPENDENT_AMBULATORY_CARE_PROVIDER_SITE_OTHER): Payer: PRIVATE HEALTH INSURANCE

## 2011-04-05 DIAGNOSIS — N390 Urinary tract infection, site not specified: Secondary | ICD-10-CM | POA: Insufficient documentation

## 2011-04-05 DIAGNOSIS — IMO0001 Reserved for inherently not codable concepts without codable children: Secondary | ICD-10-CM

## 2011-04-05 DIAGNOSIS — D649 Anemia, unspecified: Secondary | ICD-10-CM

## 2011-04-05 DIAGNOSIS — I10 Essential (primary) hypertension: Secondary | ICD-10-CM

## 2011-04-05 DIAGNOSIS — E785 Hyperlipidemia, unspecified: Secondary | ICD-10-CM

## 2011-04-05 DIAGNOSIS — Z23 Encounter for immunization: Secondary | ICD-10-CM

## 2011-04-05 LAB — CBC WITH DIFFERENTIAL/PLATELET
Eosinophils Relative: 1.1 % (ref 0.0–5.0)
HCT: 42.6 % (ref 36.0–46.0)
Hemoglobin: 13.8 g/dL (ref 12.0–15.0)
Lymphs Abs: 1.6 10*3/uL (ref 0.7–4.0)
Monocytes Relative: 6.7 % (ref 3.0–12.0)
Neutro Abs: 6.2 10*3/uL (ref 1.4–7.7)
Platelets: 255 10*3/uL (ref 150.0–400.0)
WBC: 8.5 10*3/uL (ref 4.5–10.5)

## 2011-04-05 LAB — URINALYSIS, ROUTINE W REFLEX MICROSCOPIC
Nitrite: NEGATIVE
Specific Gravity, Urine: 1.025 (ref 1.000–1.030)
Urine Glucose: NEGATIVE
Urobilinogen, UA: 0.2 (ref 0.0–1.0)

## 2011-04-05 LAB — COMPREHENSIVE METABOLIC PANEL
Albumin: 3.9 g/dL (ref 3.5–5.2)
Alkaline Phosphatase: 90 U/L (ref 39–117)
BUN: 22 mg/dL (ref 6–23)
CO2: 25 mEq/L (ref 19–32)
Calcium: 9.8 mg/dL (ref 8.4–10.5)
Chloride: 108 mEq/L (ref 96–112)
GFR: 81.36 mL/min (ref >60.00)
Glucose, Bld: 104 mg/dL — ABNORMAL HIGH (ref 70–99)
Potassium: 5.2 mEq/L — ABNORMAL HIGH (ref 3.5–5.1)

## 2011-04-05 LAB — MICROALBUMIN / CREATININE URINE RATIO: Microalb, Ur: 3.2 mg/dL — ABNORMAL HIGH (ref 0.0–1.9)

## 2011-04-05 LAB — FERRITIN: Ferritin: 102.8 ng/mL (ref 10.0–291.0)

## 2011-04-05 LAB — HM DIABETES FOOT EXAM: HM Diabetic Foot Exam: NORMAL

## 2011-04-05 LAB — IBC PANEL: Saturation Ratios: 12.4 % — ABNORMAL LOW (ref 20.0–50.0)

## 2011-04-05 LAB — VITAMIN B12: Vitamin B-12: 604 pg/mL (ref 211–911)

## 2011-04-05 LAB — CK: Total CK: 44 U/L (ref 7–177)

## 2011-04-05 MED ORDER — CIPROFLOXACIN HCL 500 MG PO TABS: 500.0000 mg | ORAL_TABLET | Freq: Two times a day (BID) | ORAL | Status: AC

## 2011-04-05 MED ORDER — METFORMIN HCL 500 MG PO TABS: 500.0000 mg | ORAL_TABLET | Freq: Two times a day (BID) | ORAL | Status: DC

## 2011-04-05 NOTE — Assessment & Plan Note (Signed)
Her BP is well controlled, she had to stop the ACEI

## 2011-04-05 NOTE — Progress Notes (Signed)
Subjective:    Patient ID: Megan Atkinson, female    DOB: 09-02-1950, 61 y.o.   MRN: [id number]  Diabetes She presents for her follow-up diabetic visit. She has type 2 diabetes mellitus. Her disease course has been stable. Pertinent negatives for hypoglycemia include no dizziness, headaches, pallor, seizures, speech difficulty or tremors. Pertinent negatives for diabetes include no blurred vision, no chest pain, no fatigue, no foot paresthesias, no foot ulcerations, no polydipsia, no polyphagia, no polyuria, no visual change, no weakness and no weight loss. Symptoms are stable. Diabetic complications include heart disease. Current diabetic treatment includes oral agent (monotherapy). She is compliant with treatment all of the time. Her weight is stable. She is following a generally healthy diet. Meal planning includes avoidance of concentrated sweets. She never participates in exercise. There is no change in her home blood glucose trend. An ACE inhibitor/angiotensin II receptor blocker is contraindicated. She does not see a podiatrist.Eye exam is current.      Review of Systems  Constitutional: Negative for fever, chills, weight loss, diaphoresis, activity change, appetite change, fatigue and unexpected weight change.  HENT: Negative.   Eyes: Negative.  Negative for blurred vision.  Respiratory: Negative for cough, chest tightness, shortness of breath, wheezing and stridor.   Cardiovascular: Negative for chest pain, palpitations and leg swelling.  Gastrointestinal: Negative for nausea, vomiting, abdominal pain, diarrhea, constipation, blood in stool, abdominal distention and anal bleeding.  Genitourinary: Negative for dysuria, urgency, polyuria, frequency, hematuria, flank pain, decreased urine volume, vaginal bleeding, vaginal discharge, enuresis, difficulty urinating, vaginal pain, pelvic pain and dyspareunia.  Musculoskeletal: Negative for myalgias, back pain, joint swelling, arthralgias and  gait problem.  Skin: Negative for color change, pallor, rash and wound.  Neurological: Negative for dizziness, tremors, seizures, syncope, facial asymmetry, speech difficulty, weakness, light-headedness, numbness and headaches.  Hematological: Negative for polydipsia, polyphagia and adenopathy. Does not bruise/bleed easily.  Psychiatric/Behavioral: Negative.        Objective:   Physical Exam  Vitals reviewed. Constitutional: She is oriented to person, place, and time. She appears well-developed and well-nourished. No distress.  HENT:  Head: Normocephalic and atraumatic.  Mouth/Throat: Oropharynx is clear and moist. No oropharyngeal exudate.  Eyes: Conjunctivae are normal. Right eye exhibits no discharge. Left eye exhibits no discharge. No scleral icterus.  Neck: Normal range of motion. Neck supple. No JVD present. No tracheal deviation present. No thyromegaly present.  Cardiovascular: Normal rate, regular rhythm, normal heart sounds and intact distal pulses.  Exam reveals no gallop and no friction rub.   No murmur heard. Pulmonary/Chest: Effort normal and breath sounds normal. No stridor. No respiratory distress. She has no wheezes. She has no rales. She exhibits no tenderness.  Abdominal: Soft. Bowel sounds are normal. She exhibits no distension and no mass. There is no tenderness. There is no rebound and no guarding.  Musculoskeletal: Normal range of motion. She exhibits no edema and no tenderness.  Lymphadenopathy:    She has no cervical adenopathy.  Neurological: She is oriented to person, place, and time.  Skin: Skin is warm and dry. No rash noted. She is not diaphoretic. No erythema. No pallor.  Psychiatric: She has a normal mood and affect. Her behavior is normal. Judgment and thought content normal.      Lab Results  Component Value Date   WBC 14.6* 01/15/2011   HGB 8.5* 01/15/2011   HCT 26.9* 01/15/2011   PLT 204 01/15/2011   GLUCOSE 100* 03/01/2011   CHOL 141 03/01/2011    TRIG  85.0 03/01/2011   HDL 49.70 03/01/2011   LDLCALC 74 03/01/2011   ALT 18 03/01/2011   AST 14 03/01/2011   NA 142 03/01/2011   K 3.9 03/01/2011   CL 109 03/01/2011   CREATININE 0.7 03/01/2011   BUN 13 03/01/2011   CO2 25 03/01/2011   TSH 2.187 01/05/2011   INR 1.27 01/12/2011   HGBA1C 6.4* 01/05/2011      Assessment & Plan:

## 2011-04-05 NOTE — Patient Instructions (Signed)
Diabetes, Type 2 Diabetes is a long-lasting (chronic) disease. In type 2 diabetes, the pancreas does not make enough insulin (a hormone), and the body does not respond normally to the insulin that is made. This type of diabetes was also previously called adult-onset diabetes. It usually occurs after the age of 40, but it can occur at any age.  CAUSES  Type 2 diabetes happens because the pancreasis not making enough insulin or your body has trouble using the insulin that your pancreas does make properly. SYMPTOMS   Drinking more than usual.   Urinating more than usual.   Blurred vision.   Dry, itchy skin.   Frequent infections.   Feeling more tired than usual (fatigue).  DIAGNOSIS The diagnosis of type 2 diabetes is usually made by one of the following tests:  Fasting blood glucose test. You will not eat for at least 8 hours and then take a blood test.   Random blood glucose test. Your blood glucose (sugar) is checked at any time of the day regardless of when you ate.   Oral glucose tolerance test (OGTT). Your blood glucose is measured after you have not eaten (fasted) and then after you drink a glucose containing beverage.  TREATMENT   Healthy eating.   Exercise.   Medicine, if needed.   Monitoring blood glucose.   Seeing your caregiver regularly.  HOME CARE INSTRUCTIONS   Check your blood glucose at least once a day. More frequent monitoring may be necessary, depending on your medicines and on how well your diabetes is controlled. Your caregiver will advise you.   Take your medicine as directed by your caregiver.   Do not smoke.   Make wise food choices. Ask your caregiver for information. Weight loss can improve your diabetes.   Learn about low blood glucose (hypoglycemia) and how to treat it.   Get your eyes checked regularly.   Have a yearly physical exam. Have your blood pressure checked and your blood and urine tested.   Wear a pendant or bracelet saying  that you have diabetes.   Check your feet every night for cuts, sores, blisters, and redness. Let your caregiver know if you have any problems.  SEEK MEDICAL CARE IF:   You have problems keeping your blood glucose in target range.   You have problems with your medicines.   You have symptoms of an illness that do not improve after 24 hours.   You have a sore or wound that is not healing.   You notice a change in vision or a new problem with your vision.   You have a fever.  MAKE SURE YOU:  Understand these instructions.   Will watch your condition.   Will get help right away if you are not doing well or get worse.  Document Released: 01/23/2005 Document Revised: 10/06/2010 Document Reviewed: 07/11/2010 ExitCare Patient Information 2012 [employer].Anemia, Nonspecific Your exam and blood tests show you are anemic. This means your blood (hemoglobin) level is low. Normal hemoglobin values are 12 to 15 g/dL for females and 14 to 17 g/dL for males. Make a note of your hemoglobin level today. The hematocrit percent is also used to measure anemia. A normal hematocrit is 38% to 46% in females and 42% to 49% in males. Make a note of your hematocrit level today. CAUSES  Anemia can be due to many different causes.  Excessive bleeding from periods (in women).   Intestinal bleeding.   Poor nutrition.   Kidney, thyroid,   liver, and bone marrow diseases.  SYMPTOMS  Anemia can come on suddenly (acute). It can also come on slowly. Symptoms can include:  Minor weakness.   Dizziness.   Palpitations.   Shortness of breath.  Symptoms may be absent until half your hemoglobin is missing if it comes on slowly. Anemia due to acute blood loss from an injury or internal bleeding may require blood transfusion if the loss is severe. Hospital care is needed if you are anemic and there is significant continual blood loss. TREATMENT   Stool tests for blood (Hemoccult) and additional lab tests are  often needed. This determines the best treatment.   Further checking on your condition and your response to treatment is very important. It often takes many weeks to correct anemia.  Depending on the cause, treatment can include:  Supplements of iron.   Vitamins B12 and folic acid.   Hormone medicines.If your anemia is due to bleeding, finding the cause of the blood loss is very important. This will help avoid further problems.  SEEK IMMEDIATE MEDICAL CARE IF:   You develop fainting, extreme weakness, shortness of breath, or chest pain.   You develop heavy vaginal bleeding.   You develop bloody or black, tarry stools or vomit up blood.   You develop a high fever, rash, repeated vomiting, or dehydration.  Document Released: 03/02/2004 Document Revised: 10/05/2010 Document Reviewed: 12/08/2008 Exit[employer] Patient Information 2012 [employer]. 

## 2011-04-05 NOTE — Assessment & Plan Note (Signed)
She is doing well on crestor, I will check her CMP and CPK levels today

## 2011-04-05 NOTE — Progress Notes (Signed)
Addended by: Etta Grandchild on: 04/05/2011 02:58 PM   Modules accepted: Orders, Level of Service

## 2011-04-05 NOTE — Assessment & Plan Note (Signed)
Urine looks infected so I sent in an Rx for cipro

## 2011-04-05 NOTE — Assessment & Plan Note (Signed)
I will recheck her CBC and will look at her vitamin levels as well 

## 2011-04-05 NOTE — Assessment & Plan Note (Signed)
I will recheck her a1c today and will monitor her renal function

## 2011-04-19 ENCOUNTER — Ambulatory Visit (INDEPENDENT_AMBULATORY_CARE_PROVIDER_SITE_OTHER): Payer: PRIVATE HEALTH INSURANCE | Admitting: Cardiology

## 2011-04-19 ENCOUNTER — Encounter: Payer: Self-pay | Admitting: Cardiology

## 2011-04-19 VITALS — BP 152/76 | HR 80 | Ht 67.0 in | Wt 309.0 lb

## 2011-04-19 DIAGNOSIS — E7800 Pure hypercholesterolemia, unspecified: Secondary | ICD-10-CM

## 2011-04-19 DIAGNOSIS — E785 Hyperlipidemia, unspecified: Secondary | ICD-10-CM

## 2011-04-19 MED ORDER — METOPROLOL SUCCINATE ER 50 MG PO TB24: 50.0000 mg | ORAL_TABLET | Freq: Every day | ORAL | Status: DC

## 2011-04-19 MED ORDER — PRAVASTATIN SODIUM 40 MG PO TABS: 40.0000 mg | ORAL_TABLET | Freq: Every evening | ORAL | Status: DC

## 2011-04-19 NOTE — Assessment & Plan Note (Signed)
Blood pressure elevated. Increase Toprol to 50 mg daily. 

## 2011-04-19 NOTE — Assessment & Plan Note (Signed)
Patient cannot afford Crestor. Discontinue and begin Pravachol 40 mg daily. Check lipids and liver in 6 weeks.

## 2011-04-19 NOTE — Patient Instructions (Signed)
Your physician wants you to follow-up in: 6 MONTHS You will receive a reminder letter in the mail two months in advance. If you don't receive a letter, please call our office to schedule the follow-up appointment.   STOP CRESTOR  START PRAVASTATIN 40 MG ONCE DAILY AT BEDTIME  Your physician recommends that you return for lab work in: 6 WEEKS=FASTING=LAST WEEK IN April  INCREASE METOPROLOL SUCC (TOPROL) TO 50 MG ONCE DAILY

## 2011-04-19 NOTE — Assessment & Plan Note (Signed)
Continue aspirin and statin. 

## 2011-04-19 NOTE — Progress Notes (Signed)
HPI: Pleasant female for follow up of CAD. She was admitted 11/29-12/9 with an NSTEMI. She presented with exertional dyspnea and right-sided chest pressure. Cardiac markers were minimally elevated. LHC 01/06/11: LAD 90-95%, mid D1 90%, proximal D2 50%, proximal OM 50%, RCA occluded, EF greater than 65%. She was referred for bypass. This was done with Dr. Laneta Simmers 01/12/11: LIMA-LAD. The distal RCA was diffusely diseased and not thought to be graftable. Saphenous vein was fairly small and not felt suitable as a conduit. Echocardiogram 01/06/11: EF 65%. PreCABG Dopplers: No ICA stenosis.  Seen by Tereso Newcomer 1/13. Since then, the patient has dyspnea with more extreme activities but not with routine activities. It is relieved with rest. It is not associated with chest pain. There is no orthopnea, PND or pedal edema. There is no syncope or palpitations. There is no exertional chest pain.    Current Outpatient Prescriptions  Medication Sig Dispense Refill  . aspirin 325 MG tablet Take 325 mg by mouth daily.        . Ginkgo 60 MG TABS Take 2 tablets by mouth daily. For memory       . glucosamine-chondroitin 500-400 MG tablet Take 1 tablet by mouth daily.        Chilton Si Tea, Camillia sinensis, 1000 MG TABS Take 1 tablet by mouth daily.       Marland Kitchen ibuprofen (ADVIL,MOTRIN) 200 MG tablet Take 200 mg by mouth every 6 (six) hours as needed.      . metFORMIN (GLUCOPHAGE) 500 MG tablet Take 1 tablet (500 mg total) by mouth 2 (two) times daily with a meal.  180 tablet  1  . metoprolol succinate (TOPROL-XL) 25 MG 24 hr tablet Take 1 tablet (25 mg total) by mouth daily.  30 tablet  3  . Misc Natural Products (7-KETO LEAN) CAPS Take 1 tablet by mouth 2 (two) times daily. For menopause symptons       . Misc Natural Products (PRO HERBS RELAX/EASE TENSION PO) Take by mouth. [place] tab       . rosuvastatin (CRESTOR) 20 MG tablet Take 1 tablet (20 mg total) by mouth daily.  30 tablet  3     Past Medical History    Diagnosis Date  . Obesity   . Hyperlipidemia   . CAD (coronary artery disease)     small NSTEMI 11/12: LHC 01/06/11:  LAD 90-95%, mD1 90%, pD2 50%,  pOM1 50%, RCA occluded,  EF > 65%;  CABG  12/12: L-LAD  . Glucose intolerance (impaired glucose tolerance)     Hemoglobin A1c 6.4 in 12/2010  . HTN (hypertension)     Echo 01/06/11: EF 65%  . Diabetes mellitus     Past Surgical History  Procedure Date  . Abdominal hysterectomy   . Appendectomy   . Coronary artery bypass graft 01/12/2011    Procedure: CORONARY ARTERY BYPASS GRAFTING (CABG);  Surgeon: Alleen Borne, MD;  Location: [facility] OR;  Service: Open Heart Surgery;  Laterality: N/A;  coronary artery bypass graft times one using left internal mammary artery . Attempted endoscopic saphenous vein harvest    History   Social History  . Marital Status: Single    Spouse Name: N/A    Number of Children: 2  . Years of Education: N/A   Occupational History  .     Social History Main Topics  . Smoking status: Never Smoker   . Smokeless tobacco: Not on file  . Alcohol Use: Yes  Occasional  . Drug Use: No  . Sexually Active: Not on file   Other Topics Concern  . Not on file   Social History Narrative  . No narrative on file    ROS: Some residual soreness at sternotomy but no fevers or chills, productive cough, hemoptysis, dysphasia, odynophagia, melena, hematochezia, dysuria, hematuria, rash, seizure activity, orthopnea, PND, pedal edema, claudication. Remaining systems are negative.  Physical Exam: Well-developed obese in no acute distress.  Skin is warm and dry.  HEENT is normal.  Neck is supple. No thyromegaly.  Chest is clear to auscultation with normal expansion. Sternotomy without evidence of infection. Cardiovascular exam is regular rate and rhythm.  Abdominal exam nontender or distended. No masses palpated. Extremities show no edema. neuro grossly intact

## 2011-06-01 ENCOUNTER — Other Ambulatory Visit (INDEPENDENT_AMBULATORY_CARE_PROVIDER_SITE_OTHER): Payer: PRIVATE HEALTH INSURANCE

## 2011-06-01 DIAGNOSIS — E7800 Pure hypercholesterolemia, unspecified: Secondary | ICD-10-CM

## 2011-06-01 LAB — HEPATIC FUNCTION PANEL
ALT: 16 U/L (ref 0–35)
Albumin: 3.6 g/dL (ref 3.5–5.2)
Bilirubin, Direct: 0 mg/dL (ref 0.0–0.3)
Total Protein: 7.3 g/dL (ref 6.0–8.3)

## 2011-06-01 LAB — LIPID PANEL
Cholesterol: 207 mg/dL — ABNORMAL HIGH (ref 0–200)
HDL: 54.8 mg/dL (ref >39.00)
Triglycerides: 104 mg/dL (ref 0.0–149.0)
VLDL: 20.8 mg/dL (ref 0.0–40.0)

## 2011-06-01 LAB — LDL CHOLESTEROL, DIRECT: Direct LDL: 148.7 mg/dL

## 2011-06-02 ENCOUNTER — Telehealth: Payer: Self-pay | Admitting: *Deleted

## 2011-06-02 DIAGNOSIS — E7800 Pure hypercholesterolemia, unspecified: Secondary | ICD-10-CM

## 2011-06-02 MED ORDER — PRAVASTATIN SODIUM 80 MG PO TABS: 80.0000 mg | ORAL_TABLET | Freq: Every evening | ORAL | Status: DC

## 2011-06-02 NOTE — Telephone Encounter (Signed)
Spoke with pt, she prefers not to take lipitor. She will increase the pravachol to 80 and recheck labs.

## 2011-06-02 NOTE — Telephone Encounter (Signed)
Message copied by Freddi Starr on Fri Jun 02, 2011 10:15 AM ------      Message from: Lewayne Bunting      Created: Thu Jun 01, 2011  3:35 PM       Chol not at goal; if she can, dc pravachol and begin lipitor 80 mg daily; check lipids and liver in six weeks; if she can't afford, increase pravachol to 80 mg po daily and check lipids and liver in six weeks.      Olga Millers

## 2011-07-06 ENCOUNTER — Encounter (INDEPENDENT_AMBULATORY_CARE_PROVIDER_SITE_OTHER): Payer: No Typology Code available for payment source

## 2011-07-06 DIAGNOSIS — R0989 Other specified symptoms and signs involving the circulatory and respiratory systems: Secondary | ICD-10-CM

## 2011-07-12 ENCOUNTER — Other Ambulatory Visit: Payer: PRIVATE HEALTH INSURANCE

## 2012-01-24 ENCOUNTER — Telehealth: Payer: Self-pay | Admitting: Cardiology

## 2012-01-24 NOTE — Telephone Encounter (Signed)
New Problem: ° ° ° °I called the patient and was unable to reach them. I left a message on their voicemail with my name, the reason I called, the name of their physician, and a number to call back to schedule their appointment. ° °

## 2012-05-16 ENCOUNTER — Other Ambulatory Visit: Payer: Self-pay | Admitting: Cardiology

## 2012-05-22 ENCOUNTER — Telehealth: Payer: Self-pay | Admitting: *Deleted

## 2012-05-22 MED ORDER — METOPROLOL TARTRATE 25 MG PO TABS: 25.0000 mg | ORAL_TABLET | Freq: Two times a day (BID) | ORAL | Status: DC

## 2012-05-22 NOTE — Telephone Encounter (Signed)
Spoke with pt, aware of new medicine regimen. Script called to [facility]

## 2012-05-22 NOTE — Telephone Encounter (Signed)
Pt wants to know if she can switch to Metoprolol Tart instead of succ due to cost. Need approval from Nurse inorder to fill this.

## 2012-06-13 ENCOUNTER — Other Ambulatory Visit: Payer: Self-pay | Admitting: Cardiology

## 2012-07-05 ENCOUNTER — Encounter: Payer: Self-pay | Admitting: Cardiology

## 2012-07-05 ENCOUNTER — Ambulatory Visit (INDEPENDENT_AMBULATORY_CARE_PROVIDER_SITE_OTHER): Payer: No Typology Code available for payment source | Admitting: Cardiology

## 2012-07-05 VITALS — BP 150/92 | HR 72 | Ht 67.0 in | Wt 309.0 lb

## 2012-07-05 DIAGNOSIS — I2510 Atherosclerotic heart disease of native coronary artery without angina pectoris: Secondary | ICD-10-CM

## 2012-07-05 DIAGNOSIS — I10 Essential (primary) hypertension: Secondary | ICD-10-CM

## 2012-07-05 MED ORDER — METOPROLOL TARTRATE 50 MG PO TABS: 50.0000 mg | ORAL_TABLET | Freq: Two times a day (BID) | ORAL | Status: DC

## 2012-07-05 NOTE — Assessment & Plan Note (Signed)
Continue aspirin and statin. 

## 2012-07-05 NOTE — Assessment & Plan Note (Signed)
Blood pressure elevated. Change metoprolol to 50 mg by mouth twice a day.

## 2012-07-05 NOTE — Progress Notes (Signed)
HPI: Pleasant female for follow up of CAD. She was admitted 11/12 with an NSTEMI. LHC 01/06/11: LAD 90-95%, mid D1 90%, proximal D2 50%, proximal OM 50%, RCA occluded, EF greater than 65%. She was referred for bypass. This was done with Dr. Laneta Simmers 01/12/11: LIMA-LAD. The distal RCA was diffusely diseased and not thought to be graftable. Saphenous vein was fairly small and not felt suitable as a conduit. Echocardiogram 01/06/11: EF 65%. PreCABG Dopplers: No ICA stenosis. Patient last seen in March of 2013. Since then, the patient has dyspnea with more extreme activities but not with routine activities. It is relieved with rest. It is not associated with chest pain. There is no orthopnea, PND or pedal edema. There is no syncope or palpitations. There is no exertional chest pain.    Current Outpatient Prescriptions  Medication Sig Dispense Refill  . aspirin 325 MG tablet Take 325 mg by mouth daily.        . Ginkgo 60 MG TABS Take 2 tablets by mouth daily. For memory       . glucosamine-chondroitin 500-400 MG tablet Take 1 tablet by mouth daily.        Chilton Si Tea, Camillia sinensis, 1000 MG TABS Take 1 tablet by mouth daily.       Marland Kitchen ibuprofen (ADVIL,MOTRIN) 200 MG tablet Take 200 mg by mouth every 6 (six) hours as needed.      . metoprolol tartrate (LOPRESSOR) 25 MG tablet Take 1 tablet (25 mg total) by mouth 2 (two) times daily.  60 tablet  11  . Misc Natural Products (7-KETO LEAN) CAPS Take 1 tablet by mouth 2 (two) times daily. For menopause symptons       . pravastatin (PRAVACHOL) 40 MG tablet TAKE TWO TABLETS BY MOUTH IN THE EVENING  60 tablet  0  . metFORMIN (GLUCOPHAGE) 500 MG tablet Take 1 tablet (500 mg total) by mouth 2 (two) times daily with a meal.  180 tablet  1   No current facility-administered medications for this visit.     Past Medical History  Diagnosis Date  . Obesity   . Hyperlipidemia   . CAD (coronary artery disease)     small NSTEMI 11/12: LHC 01/06/11:  LAD 90-95%,  mD1 90%, pD2 50%,  pOM1 50%, RCA occluded,  EF > 65%;  CABG  12/12: L-LAD  . Glucose intolerance (impaired glucose tolerance)     Hemoglobin A1c 6.4 in 12/2010  . HTN (hypertension)     Echo 01/06/11: EF 65%  . Diabetes mellitus     Past Surgical History  Procedure Laterality Date  . Abdominal hysterectomy    . Appendectomy    . Coronary artery bypass graft  01/12/2011    Procedure: CORONARY ARTERY BYPASS GRAFTING (CABG);  Surgeon: Alleen Borne, MD;  Location: [facility] OR;  Service: Open Heart Surgery;  Laterality: N/A;  coronary artery bypass graft times one using left internal mammary artery . Attempted endoscopic saphenous vein harvest    History   Social History  . Marital Status: Single    Spouse Name: N/A    Number of Children: 2  . Years of Education: N/A   Occupational History  .     Social History Main Topics  . Smoking status: Never Smoker   . Smokeless tobacco: Not on file  . Alcohol Use: Yes     Comment: Occasional  . Drug Use: No  . Sexually Active: Not on file   Other Topics Concern  .  Not on file   Social History Narrative  . No narrative on file    ROS: no fevers or chills, productive cough, hemoptysis, dysphasia, odynophagia, melena, hematochezia, dysuria, hematuria, rash, seizure activity, orthopnea, PND, pedal edema, claudication. Remaining systems are negative.  Physical Exam: Well-developed obese in no acute distress.  Skin is warm and dry.  HEENT is normal.  Neck is supple.  Chest is clear to auscultation with normal expansion.  Cardiovascular exam is regular rate and rhythm.  Abdominal exam nontender or distended. No masses palpated. Extremities show no edema. neuro grossly intact  ECG sinus rhythm at a rate of 72. Nonspecific ST changes.

## 2012-07-05 NOTE — Assessment & Plan Note (Signed)
Continue statin. Lipids and blood work followed by Producer, television/film[employer].

## 2012-07-05 NOTE — Patient Instructions (Addendum)
Your physician wants you to follow-up in: ONE YEAR WITH DR Shelda Pal will receive a reminder letter in the mail two months in advance. If you don't receive a letter, please call our office to schedule the follow-up appointment.   INCREASE METOPROLOL TO 50 MG TWICE DAILY

## 2013-01-07 ENCOUNTER — Other Ambulatory Visit: Payer: Self-pay | Admitting: Cardiology

## 2013-04-18 ENCOUNTER — Encounter: Payer: Self-pay | Admitting: Cardiology

## 2013-07-02 ENCOUNTER — Encounter: Payer: Self-pay | Admitting: Cardiology

## 2013-07-07 ENCOUNTER — Ambulatory Visit (INDEPENDENT_AMBULATORY_CARE_PROVIDER_SITE_OTHER): Payer: Medicare Other | Admitting: Cardiology

## 2013-07-07 ENCOUNTER — Other Ambulatory Visit: Payer: Self-pay | Admitting: *Deleted

## 2013-07-07 ENCOUNTER — Encounter: Payer: Self-pay | Admitting: Cardiology

## 2013-07-07 VITALS — BP 150/90 | HR 56 | Wt 320.0 lb

## 2013-07-07 DIAGNOSIS — I10 Essential (primary) hypertension: Secondary | ICD-10-CM

## 2013-07-07 DIAGNOSIS — E785 Hyperlipidemia, unspecified: Secondary | ICD-10-CM

## 2013-07-07 DIAGNOSIS — I2510 Atherosclerotic heart disease of native coronary artery without angina pectoris: Secondary | ICD-10-CM

## 2013-07-07 MED ORDER — PRAVASTATIN SODIUM 80 MG PO TABS: 80.0000 mg | ORAL_TABLET | Freq: Every day | ORAL | Status: DC

## 2013-07-07 NOTE — Progress Notes (Signed)
HPI: FU CAD. She was admitted 11/12 with an NSTEMI. LHC 01/06/11: LAD 90-95%, mid D1 90%, proximal D2 50%, proximal OM 50%, RCA occluded, EF greater than 65%. She was referred for bypass. This was done with Dr. Laneta Simmers 01/12/11: LIMA-LAD. The distal RCA was diffusely diseased and not thought to be graftable. Saphenous vein was fairly small and not felt suitable as a conduit. Echocardiogram 01/06/11: EF 65%. PreCABG Dopplers: No ICA stenosis. Patient last seen in May 2015. Since then, the patient has dyspnea with more extreme activities but not with routine activities. It is relieved with rest. It is not associated with chest pain. There is no orthopnea, PND or pedal edema. There is no syncope or palpitations. There is no exertional chest pain.   Current Outpatient Prescriptions  Medication Sig Dispense Refill  . aspirin 325 MG tablet Take 325 mg by mouth daily.        . Ginkgo 60 MG TABS Take 2 tablets by mouth daily. For memory       . glucosamine-chondroitin 500-400 MG tablet Take 1 tablet by mouth daily.        Chilton Si Tea, Camillia sinensis, 1000 MG TABS Take 1 tablet by mouth daily.       Marland Kitchen ibuprofen (ADVIL,MOTRIN) 200 MG tablet Take 200 mg by mouth every 6 (six) hours as needed.      . metoprolol tartrate (LOPRESSOR) 50 MG tablet Take 1 tablet (50 mg total) by mouth 2 (two) times daily.  60 tablet  11  . pravastatin (PRAVACHOL) 40 MG tablet TAKE TWO TABLETS BY MOUTH ONCE DAILY IN THE EVENING  60 tablet  6  . metFORMIN (GLUCOPHAGE) 500 MG tablet Take 1 tablet (500 mg total) by mouth 2 (two) times daily with a meal.  180 tablet  1   No current facility-administered medications for this visit.     Past Medical History  Diagnosis Date  . Obesity   . Hyperlipidemia   . CAD (coronary artery disease)     small NSTEMI 11/12: LHC 01/06/11:  LAD 90-95%, mD1 90%, pD2 50%,  pOM1 50%, RCA occluded,  EF > 65%;  CABG  12/12: L-LAD  . Glucose intolerance (impaired glucose tolerance)    Hemoglobin A1c 6.4 in 12/2010  . HTN (hypertension)     Echo 01/06/11: EF 65%  . Diabetes mellitus     Past Surgical History  Procedure Laterality Date  . Abdominal hysterectomy    . Appendectomy    . Coronary artery bypass graft  01/12/2011    Procedure: CORONARY ARTERY BYPASS GRAFTING (CABG);  Surgeon: Alleen Borne, MD;  Location: [facility] OR;  Service: Open Heart Surgery;  Laterality: N/A;  coronary artery bypass graft times one using left internal mammary artery . Attempted endoscopic saphenous vein harvest    History   Social History  . Marital Status: Single    Spouse Name: N/A    Number of Children: 2  . Years of Education: N/A   Occupational History  .     Social History Main Topics  . Smoking status: Never Smoker   . Smokeless tobacco: Not on file  . Alcohol Use: Yes     Comment: Occasional  . Drug Use: No  . Sexual Activity: Not on file   Other Topics Concern  . Not on file   Social History Narrative  . No narrative on file    ROS: no fevers or chills, productive cough, hemoptysis, dysphasia, odynophagia, melena,  hematochezia, dysuria, hematuria, rash, seizure activity, orthopnea, PND, pedal edema, claudication. Remaining systems are negative.  Physical Exam: Well-developed obese in no acute distress.  Skin is warm and dry.  HEENT is normal.  Neck is supple.  Chest is clear to auscultation with normal expansion.  Cardiovascular exam is regular rate and rhythm.  Abdominal exam nontender or distended. No masses palpated. Extremities show trace edema. neuro grossly intact

## 2013-07-07 NOTE — Patient Instructions (Signed)
Your physician wants you to follow-up in: ONE YEAR WITH DR CRENSHAW You will receive a reminder letter in the mail two months in advance. If you don't receive a letter, please call our office to schedule the follow-up appointment.  

## 2013-07-07 NOTE — Assessment & Plan Note (Signed)
Continue statin. 

## 2013-07-07 NOTE — Assessment & Plan Note (Signed)
Continue aspirin and statin. 

## 2013-07-07 NOTE — Assessment & Plan Note (Signed)
Blood pressure borderline but typically controlled. Continue present medications.

## 2013-07-21 ENCOUNTER — Other Ambulatory Visit: Payer: Self-pay | Admitting: Cardiology

## 2013-07-22 ENCOUNTER — Telehealth: Payer: Self-pay | Admitting: *Deleted

## 2013-07-22 ENCOUNTER — Other Ambulatory Visit: Payer: Self-pay | Admitting: *Deleted

## 2013-07-22 DIAGNOSIS — I2510 Atherosclerotic heart disease of native coronary artery without angina pectoris: Secondary | ICD-10-CM

## 2013-07-22 DIAGNOSIS — I10 Essential (primary) hypertension: Secondary | ICD-10-CM

## 2013-07-22 MED ORDER — METOPROLOL TARTRATE 50 MG PO TABS: 50.0000 mg | ORAL_TABLET | Freq: Two times a day (BID) | ORAL | Status: DC

## 2013-07-22 NOTE — Telephone Encounter (Signed)
Patient called wanting to know if it is ok for her to take phentermine. She went to the bariatric clinic and they are wanting her to take this. Please advise. Thanks, MI

## 2013-07-23 NOTE — Telephone Encounter (Signed)
Ok for phentermine Olga MillersBrian Crenshaw

## 2013-07-23 NOTE — Telephone Encounter (Signed)
Spoke with pt, Aware of dr Ludwig Clarkscrenshaw's recommendations.  Will fax this note to diane morris np @ [phone] at pt request.

## 2014-01-14 ENCOUNTER — Encounter (HOSPITAL_COMMUNITY): Payer: Self-pay | Admitting: Cardiology

## 2014-01-27 ENCOUNTER — Encounter: Payer: Self-pay | Admitting: Cardiology

## 2014-07-31 ENCOUNTER — Other Ambulatory Visit: Payer: Self-pay

## 2014-07-31 MED ORDER — PRAVASTATIN SODIUM 80 MG PO TABS: 80.0000 mg | ORAL_TABLET | Freq: Every day | ORAL | Status: DC

## 2014-09-02 ENCOUNTER — Other Ambulatory Visit: Payer: Self-pay | Admitting: Cardiology

## 2014-09-21 ENCOUNTER — Ambulatory Visit: Payer: Self-pay | Admitting: Cardiology

## 2014-09-28 NOTE — Progress Notes (Signed)
HPI: FU CAD. She was admitted 11/12 with an NSTEMI. LHC 01/06/11: LAD 90-95%, mid D1 90%, proximal D2 50%, proximal OM 50%, RCA occluded, EF greater than 65%. She was referred for bypass. This was done with Dr. Laneta Simmers 01/12/11: LIMA-LAD. The distal RCA was diffusely diseased and not thought to be graftable. Saphenous vein was fairly small and not felt suitable as a conduit. Echocardiogram 01/06/11: EF 65%. PreCABG Dopplers: No ICA stenosis. Since last seen, she continues to describe dyspnea on exertion. No orthopnea or PND. Chronic edema. No chest pain or syncope.  Current Outpatient Prescriptions  Medication Sig Dispense Refill  . aspirin 325 MG tablet Take 325 mg by mouth daily.      . Ginkgo 60 MG TABS Take 2 tablets by mouth daily. For memory     . glucosamine-chondroitin 500-400 MG tablet Take 1 tablet by mouth daily.      Chilton Si Tea, Camillia sinensis, 1000 MG TABS Take 1 tablet by mouth daily.     Marland Kitchen ibuprofen (ADVIL,MOTRIN) 200 MG tablet Take 200 mg by mouth every 6 (six) hours as needed.    . metoprolol (LOPRESSOR) 50 MG tablet TAKE ONE TABLET BY MOUTH TWICE DAILY 60 tablet 6  . pravastatin (PRAVACHOL) 80 MG tablet Take 1 tablet (80 mg total) by mouth daily. 30 tablet 6   No current facility-administered medications for this visit.     Past Medical History  Diagnosis Date  . Obesity   . Hyperlipidemia   . CAD (coronary artery disease)     small NSTEMI 11/12: LHC 01/06/11:  LAD 90-95%, mD1 90%, pD2 50%,  pOM1 50%, RCA occluded,  EF > 65%;  CABG  12/12: L-LAD  . Glucose intolerance (impaired glucose tolerance)     Hemoglobin A1c 6.4 in 12/2010  . HTN (hypertension)     Echo 01/06/11: EF 65%  . Diabetes mellitus     Past Surgical History  Procedure Laterality Date  . Abdominal hysterectomy    . Appendectomy    . Coronary artery bypass graft  01/12/2011    Procedure: CORONARY ARTERY BYPASS GRAFTING (CABG);  Surgeon: Alleen Borne, MD;  Location: [facility] OR;  Service: Open  Heart Surgery;  Laterality: N/A;  coronary artery bypass graft times one using left internal mammary artery . Attempted endoscopic saphenous vein harvest  . Left heart catheterization with coronary angiogram N/A 01/06/2011    Procedure: LEFT HEART CATHETERIZATION WITH CORONARY ANGIOGRAM;  Surgeon: Rollene Rotunda, MD;  Location: [facility] CATH LAB;  Service: Cardiovascular;  Laterality: N/A;    Social History   Social History  . Marital Status: Single    Spouse Name: N/A  . Number of Children: 2  . Years of Education: N/A   Occupational History  .     Social History Main Topics  . Smoking status: Never Smoker   . Smokeless tobacco: Not on file  . Alcohol Use: Yes     Comment: Occasional  . Drug Use: No  . Sexual Activity: Not on file   Other Topics Concern  . Not on file   Social History Narrative    ROS: no fevers or chills, productive cough, hemoptysis, dysphasia, odynophagia, melena, hematochezia, dysuria, hematuria, rash, seizure activity, orthopnea, PND, pedal edema, claudication. Remaining systems are negative.  Physical Exam: Well-developed obese in no acute distress.  Skin is warm and dry.  HEENT is normal.  Neck is supple.  Chest is clear to auscultation with normal expansion.  Cardiovascular  exam is regular rate and rhythm.  Abdominal exam nontender or distended. No masses palpated. Extremities show 1+ edema. neuro grossly intact  ECG sinus rhythm at a rate of 65. Left ventricular hypertrophy. Cannot rule out prior anterior infarct. Nonspecific ST changes.

## 2014-10-02 ENCOUNTER — Encounter: Payer: Self-pay | Admitting: Cardiology

## 2014-10-02 ENCOUNTER — Encounter: Payer: Self-pay | Admitting: *Deleted

## 2014-10-02 ENCOUNTER — Ambulatory Visit (INDEPENDENT_AMBULATORY_CARE_PROVIDER_SITE_OTHER): Payer: Medicare Other | Admitting: Cardiology

## 2014-10-02 VITALS — BP 130/80 | HR 65 | Ht 67.0 in | Wt 341.1 lb

## 2014-10-02 DIAGNOSIS — I2583 Coronary atherosclerosis due to lipid rich plaque: Principal | ICD-10-CM

## 2014-10-02 DIAGNOSIS — I10 Essential (primary) hypertension: Secondary | ICD-10-CM

## 2014-10-02 DIAGNOSIS — E785 Hyperlipidemia, unspecified: Secondary | ICD-10-CM

## 2014-10-02 DIAGNOSIS — G4730 Sleep apnea, unspecified: Secondary | ICD-10-CM

## 2014-10-02 DIAGNOSIS — I2510 Atherosclerotic heart disease of native coronary artery without angina pectoris: Secondary | ICD-10-CM

## 2014-10-02 DIAGNOSIS — R0600 Dyspnea, unspecified: Secondary | ICD-10-CM | POA: Diagnosis not present

## 2014-10-02 NOTE — Assessment & Plan Note (Signed)
Possibly from obesity hypoventilation syndrome and obstructive sleep apnea. Check echocardiogram for LV systolic and diastolic function. Check BNP. Refer to pulmonary for possible sleep apnea evaluation.

## 2014-10-02 NOTE — Assessment & Plan Note (Signed)
Blood pressure controlled. Continue present medications. Check potassium and renal function. 

## 2014-10-02 NOTE — Assessment & Plan Note (Signed)
Continue statin. Check lipids and liver. 

## 2014-10-02 NOTE — Assessment & Plan Note (Signed)
Continue aspirin and statin. 

## 2014-10-02 NOTE — Patient Instructions (Signed)
Your physician wants you to follow-up in: ONE YEAR WITH DR Shelda Pal will receive a reminder letter in the mail two months in advance. If you don't receive a letter, please call our office to schedule the follow-up appointment.   Your physician has requested that you have an echocardiogram. Echocardiography is a painless test that uses sound waves to create images of your heart. It provides your doctor with information about the size and shape of your heart and how well your heart's chambers and valves are working. This procedure takes approximately one hour. There are no restrictions for this procedure.   Your physician recommends that you return for lab work PRIOR TO EATING  REFERRAL TO [facility] FOR SLEEP APNEA

## 2014-10-13 ENCOUNTER — Ambulatory Visit (HOSPITAL_COMMUNITY): Payer: Medicare Other | Attending: Cardiology

## 2014-10-13 ENCOUNTER — Other Ambulatory Visit: Payer: Self-pay

## 2014-10-13 DIAGNOSIS — E785 Hyperlipidemia, unspecified: Secondary | ICD-10-CM | POA: Insufficient documentation

## 2014-10-13 DIAGNOSIS — I071 Rheumatic tricuspid insufficiency: Secondary | ICD-10-CM | POA: Diagnosis not present

## 2014-10-13 DIAGNOSIS — E119 Type 2 diabetes mellitus without complications: Secondary | ICD-10-CM | POA: Diagnosis not present

## 2014-10-13 DIAGNOSIS — Z8249 Family history of ischemic heart disease and other diseases of the circulatory system: Secondary | ICD-10-CM | POA: Diagnosis not present

## 2014-10-13 DIAGNOSIS — I371 Nonrheumatic pulmonary valve insufficiency: Secondary | ICD-10-CM | POA: Insufficient documentation

## 2014-10-13 DIAGNOSIS — I10 Essential (primary) hypertension: Secondary | ICD-10-CM | POA: Diagnosis not present

## 2014-10-13 DIAGNOSIS — R0600 Dyspnea, unspecified: Secondary | ICD-10-CM | POA: Insufficient documentation

## 2014-11-24 ENCOUNTER — Ambulatory Visit (INDEPENDENT_AMBULATORY_CARE_PROVIDER_SITE_OTHER): Payer: Medicare Other | Admitting: Pulmonary Disease

## 2014-11-24 ENCOUNTER — Encounter: Payer: Self-pay | Admitting: Pulmonary Disease

## 2014-11-24 ENCOUNTER — Encounter (INDEPENDENT_AMBULATORY_CARE_PROVIDER_SITE_OTHER): Payer: Self-pay

## 2014-11-24 VITALS — BP 142/78 | HR 63 | Ht 67.0 in | Wt 316.8 lb

## 2014-11-24 DIAGNOSIS — R0600 Dyspnea, unspecified: Secondary | ICD-10-CM

## 2014-11-24 DIAGNOSIS — G4733 Obstructive sleep apnea (adult) (pediatric): Secondary | ICD-10-CM | POA: Insufficient documentation

## 2014-11-24 DIAGNOSIS — Z23 Encounter for immunization: Secondary | ICD-10-CM

## 2014-11-24 MED ORDER — FUROSEMIDE 20 MG PO TABS: 20.0000 mg | ORAL_TABLET | Freq: Every day | ORAL | Status: DC

## 2014-11-24 NOTE — Assessment & Plan Note (Signed)
Given excessive daytime somnolence, narrow pharyngeal exam, witnessed apneas & loud snoring, obstructive sleep apnea is very likely & an overnight polysomnogram will be scheduled as a home study. The pathophysiology of obstructive sleep apnea , it's cardiovascular consequences & modes of treatment including CPAP were discused with the patient in detail & they evidenced understanding. Pre test prob intermediate

## 2014-11-24 NOTE — Assessment & Plan Note (Signed)
She does not have any risk factors for obstructive lung disease. Dyspnea is likely related to obesity and deconditioning. She does not have any risk factors for VTE. Her exam and prior chest x-ray did not suggest any pulmonary fibrosis

## 2014-11-24 NOTE — Patient Instructions (Signed)
Home sleep study Weight loss encouraged

## 2014-11-24 NOTE — Progress Notes (Signed)
Subjective:    Patient ID: Megan NielsenBeverley Atkinson, female    DOB: 1950-05-02, 64 y.o.   MRN: [id number]  HPI  Chief Complaint  Patient presents with  . Sleep Consult    Referred by Dr. Jens Somrenshaw; had triple bypass 2012. SOB since then.  Pt has no trouble sleeping.  Pt does not want CPAP. snoring, no witnessed apneas.  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 She also reports dyspnea on exertion. She sees cardiology for CAD, status post CABG 2012, echo 10/2014 showed normal LV function. She has chronic bilateral lower extremity lymphedema. Since her CABG she reports dyspnea on exertion. She admits to gaining weight and unquantified amount. She denies wheezing or frequent chest colds. She is a lifetime never smoker. She denies childhood history of asthma. Cardiology note suggests that she is being referred for evaluation of OSA. I do not see any ABGs in the system. Epworth sleepiness score is 2 when she denies excessive daytime drowsiness. No bed partner history is available, she denies gasping or choking episodes in her sleep. Bedtime is around 9 PM, sleep latency is minimal, she sleeps on her right side with 2 pillows, reports one nocturnal awakening, denies nocturia and is out of bed by 8 AM, feeling rested without dryness of mouth or headaches There is no history suggestive of cataplexy, sleep paralysis or parasomnias     Past Medical History  Diagnosis Date  . Obesity   . Hyperlipidemia   . CAD (coronary artery disease)     small NSTEMI 11/12: LHC 01/06/11:  LAD 90-95%, mD1 90%, pD2 50%,  pOM1 50%, RCA occluded,  EF > 65%;  CABG  12/12: L-LAD  . Glucose intolerance (impaired glucose tolerance)     Hemoglobin A1c 6.4 in 12/2010  . HTN (hypertension)     Echo 01/06/11: EF 65%  . Diabetes mellitus     Past Surgical History  Procedure Laterality Date  . Abdominal hysterectomy      . Appendectomy    . Coronary artery bypass graft  01/12/2011    Procedure: CORONARY ARTERY BYPASS GRAFTING (CABG);  Surgeon: Alleen BorneBryan K [name], MD;  [facility];  Service: Open Heart Surgery;  Laterality: N/A;  coronary artery bypass graft times one using left internal mammary artery . Attempted endoscopic saphenous vein harvest  . Left heart catheterization with coronary angiogram N/A 01/06/2011    Procedure: LEFT HEART CATHETERIZATION WITH CORONARY ANGIOGRAM;  Surgeon: Rollene RotundaJames Hochrein, MD;  [facility];  Service: Cardiovascular;  Laterality: N/A;    Allergies  Allergen Reactions  . Lisinopril     cough  . Metformin And Related Diarrhea    A lot of fluid loss from bowel    Social History   Social History  . Marital Status: Single    Spouse Name: N/A  . Number of Children: 2  . Years of Education: N/A   Occupational History  .     Social History Main Topics  . Smoking status: Never Smoker   . Smokeless tobacco: Not on file  . Alcohol Use: Yes     Comment: Occasional  . Drug Use: No  . Sexual Activity: Not on file   Other Topics Concern  . Not on file   Social History Narrative    Family History  Problem Relation Age of Onset  . Coronary artery disease      No family  history    Review of Systems  Constitutional: Negative for fever, chills and unexpected weight change.  HENT: Negative for congestion, dental problem, ear pain, nosebleeds, postnasal drip, rhinorrhea, sinus pressure, sneezing, sore throat, trouble swallowing and voice change.   Eyes: Negative for visual disturbance.  Respiratory: Negative for cough, choking and shortness of breath.   Cardiovascular: Negative for chest pain and leg swelling.  Gastrointestinal: Negative for vomiting, abdominal pain and diarrhea.  Genitourinary: Negative for difficulty urinating.  Musculoskeletal: Negative for arthralgias.  Skin: Negative for rash.  Neurological: Negative for tremors, syncope and headaches.   Hematological: Does not bruise/bleed easily.       Objective:   Physical Exam Gen. Pleasant, obese, in no distress, normal affect ENT - no lesions, no post nasal drip, class 2-3 airway Neck: No JVD, no thyromegaly, no carotid bruits Lungs: no use of accessory muscles, no dullness to percussion, decreased without rales or rhonchi  Cardiovascular: Rhythm regular, heart sounds  normal, no murmurs or gallops, no peripheral edema Abdomen: soft and non-tender, no hepatosplenomegaly, BS normal. Musculoskeletal: No deformities, no cyanosis or clubbing Neuro:  alert, non focal, no tremors        Assessment & Plan:

## 2014-12-09 ENCOUNTER — Telehealth: Payer: Self-pay | Admitting: Cardiology

## 2014-12-09 DIAGNOSIS — G4733 Obstructive sleep apnea (adult) (pediatric): Secondary | ICD-10-CM | POA: Diagnosis not present

## 2014-12-09 NOTE — Telephone Encounter (Signed)
Returning call to Megan Atkinson / tg

## 2014-12-09 NOTE — Telephone Encounter (Signed)
Spoke to patient   patient state she has been problem feeling bad  ,her joints hurting , sob . She has been taking medication since 2012. She does not think she needs to be on that high dose. RN informed patient --The medication is preventative.  Statin holiday - hold pravstatin until first of dec 2016. Call office let us know how you are doing. She verbalized understanding.

## 2014-12-09 NOTE — Telephone Encounter (Signed)
Patient states she is having problems with medications. States that Pravastatin is making her weak and SOB upon exertion and weak joints. / tg

## 2014-12-09 NOTE — Telephone Encounter (Signed)
Ok to hold pravachol. Megan Atkinson

## 2014-12-17 ENCOUNTER — Telehealth: Payer: Self-pay | Admitting: Pulmonary Disease

## 2014-12-17 ENCOUNTER — Other Ambulatory Visit: Payer: Self-pay | Admitting: *Deleted

## 2014-12-17 DIAGNOSIS — G4733 Obstructive sleep apnea (adult) (pediatric): Secondary | ICD-10-CM

## 2014-12-17 NOTE — Telephone Encounter (Signed)
I spoke with patient about results and she verbalized understanding and had no questions. CPAP titration ordered. Nothing further needed

## 2014-12-17 NOTE — Telephone Encounter (Signed)
HST 12/09/14 - AHI: 27.4/hr Lowest desat 79%, 92% of the time  Per Dr. Reginia NaasAlva's recommendations: Needs to schedule a CPAP titration study  Left message for patient to call back.

## 2015-03-01 ENCOUNTER — Ambulatory Visit (HOSPITAL_BASED_OUTPATIENT_CLINIC_OR_DEPARTMENT_OTHER): Payer: Medicare Other | Attending: Pulmonary Disease | Admitting: Radiology

## 2015-03-01 VITALS — Ht 67.0 in | Wt 307.0 lb

## 2015-03-01 DIAGNOSIS — Z79899 Other long term (current) drug therapy: Secondary | ICD-10-CM | POA: Insufficient documentation

## 2015-03-01 DIAGNOSIS — I493 Ventricular premature depolarization: Secondary | ICD-10-CM | POA: Diagnosis not present

## 2015-03-01 DIAGNOSIS — R0683 Snoring: Secondary | ICD-10-CM | POA: Insufficient documentation

## 2015-03-01 DIAGNOSIS — G4733 Obstructive sleep apnea (adult) (pediatric): Secondary | ICD-10-CM

## 2015-03-01 DIAGNOSIS — G4730 Sleep apnea, unspecified: Secondary | ICD-10-CM | POA: Diagnosis present

## 2015-03-10 ENCOUNTER — Telehealth: Payer: Self-pay | Admitting: Pulmonary Disease

## 2015-03-10 DIAGNOSIS — G4733 Obstructive sleep apnea (adult) (pediatric): Secondary | ICD-10-CM

## 2015-03-10 NOTE — Telephone Encounter (Signed)
Based on CPAP titration study Please send prescription for  CPAP therapy on 14 cm H2O with a Medium size Resmed Full Face Mask AirFit F20 mask and heated humidification.  Download in 4 weeks Office visit in 6

## 2015-03-10 NOTE — Progress Notes (Signed)
Patient Name: Megan Atkinson, Megan Atkinson Date: 03/01/2015 Gender: Female D.O.B: Mar 13, 1950 Age (years): 84 Referring Provider: Cyril Mourning MD, ABSM Height (inches): 67 Interpreting Physician: Cyril Mourning MD, ABSM Weight (lbs): 307 RPSGT: Ulyess Mort BMI: 48 MRN: [id number] Neck Size: 15.50   CLINICAL INFORMATION The patient is referred for a CPAP titration to treat sleep apnea. Date of HST: 12/2014, AHI 27/hour   SLEEP STUDY TECHNIQUE As per the AASM Manual for the Scoring of Sleep and Associated Events v2.3 (April 2016) with a hypopnea requiring 4% desaturations. The channels recorded and monitored were frontal, central and occipital EEG, electrooculogram (EOG), submentalis EMG (chin), nasal and oral airflow, thoracic and abdominal wall motion, anterior tibialis EMG, snore microphone, electrocardiogram, and pulse oximetry. Continuous positive airway pressure (CPAP) was initiated at the beginning of the study and titrated to treat sleep-disordered breathing.   MEDICATIONS Medications taken by the patient : GLUCOSAMINE CHONDROITIN, METOPROLOL  Medications administered by patient during sleep study : No sleep medicine administered.   RESPIRATORY PARAMETERS Optimal PAP Pressure (cm): 14 AHI at Optimal Pressure (/hr): 2.7 Overall Minimal O2 (%): 86.00 Supine % at Optimal Pressure (%): 0 Minimal O2 at Optimal Pressure (%): 90.0     SLEEP ARCHITECTURE The study was initiated at 11:05:05 PM and ended at 5:00:38 AM. Sleep onset time was 4.9 minutes and the sleep efficiency was 89.3%. The total sleep time was 317.7 minutes. The patient spent 6.93% of the night in stage N1 sleep, 64.11% in stage N2 sleep, 10.86% in stage N3 and 18.10% in REM.Stage REM latency was 67.0 minutes Wake after sleep onset was 33.0. Alpha intrusion was absent. Supine sleep was 35.60%.   CARDIAC DATA The 2 lead EKG demonstrated sinus rhythm. The mean heart rate was 76.06 beats per minute. Other EKG findings  include: PVCs.   LEG MOVEMENT DATA The total Periodic Limb Movements of Sleep (PLMS) were 4. The PLMS index was 0.76. A PLMS index of <15 is considered normal in adults.   IMPRESSIONS - The optimal PAP pressure was 14 cm of water. - Central sleep apnea was not noted during this titration (CAI = 0.8/h). - Moderate oxygen desaturations were observed during this titration (min O2 = 86.00%). - The patient snored with Soft snoring volume during this titration study. - 2-lead EKG demonstrated: PVCs - Clinically significant periodic limb movements were not noted during this study. Arousals associated with PLMs were rare.   DIAGNOSIS - Obstructive Sleep Apnea (327.23 [G47.33 ICD-10])   RECOMMENDATIONS - Trial of CPAP therapy on 14 cm H2O with a Medium size Resmed Full Face Mask AirFit F20 mask and heated humidification. - Avoid alcohol, sedatives and other CNS depressants that may worsen sleep apnea and disrupt normal sleep architecture. - Sleep hygiene should be reviewed to assess factors that may improve sleep quality. - Weight management and regular exercise should be initiated or continued. - Return to [facility] Center for re-evaluation after 4 weeks of therapy  Cyril Mourning MD. FCCP. [facility]

## 2015-03-12 DIAGNOSIS — R635 Abnormal weight gain: Secondary | ICD-10-CM | POA: Diagnosis not present

## 2015-03-12 DIAGNOSIS — N951 Menopausal and female climacteric states: Secondary | ICD-10-CM | POA: Diagnosis not present

## 2015-03-17 DIAGNOSIS — Z6841 Body Mass Index (BMI) 40.0 and over, adult: Secondary | ICD-10-CM | POA: Diagnosis not present

## 2015-03-17 DIAGNOSIS — N958 Other specified menopausal and perimenopausal disorders: Secondary | ICD-10-CM | POA: Diagnosis not present

## 2015-03-17 DIAGNOSIS — R7301 Impaired fasting glucose: Secondary | ICD-10-CM | POA: Diagnosis not present

## 2015-03-17 DIAGNOSIS — E7800 Pure hypercholesterolemia, unspecified: Secondary | ICD-10-CM | POA: Diagnosis not present

## 2015-03-17 DIAGNOSIS — E539 Vitamin B deficiency, unspecified: Secondary | ICD-10-CM | POA: Diagnosis not present

## 2015-03-17 DIAGNOSIS — E039 Hypothyroidism, unspecified: Secondary | ICD-10-CM | POA: Diagnosis not present

## 2015-03-17 DIAGNOSIS — E559 Vitamin D deficiency, unspecified: Secondary | ICD-10-CM | POA: Diagnosis not present

## 2015-03-19 NOTE — Telephone Encounter (Signed)
Patient aware of Dr. Reginia Naas recommendations. Order entered for CPAP machine. Patient schedule to see TP in 6 weeks for CPAP follow up. Nothing further needed.

## 2015-03-23 DIAGNOSIS — E559 Vitamin D deficiency, unspecified: Secondary | ICD-10-CM | POA: Diagnosis not present

## 2015-03-23 DIAGNOSIS — Z6841 Body Mass Index (BMI) 40.0 and over, adult: Secondary | ICD-10-CM | POA: Diagnosis not present

## 2015-03-23 DIAGNOSIS — E7800 Pure hypercholesterolemia, unspecified: Secondary | ICD-10-CM | POA: Diagnosis not present

## 2015-03-23 DIAGNOSIS — R7301 Impaired fasting glucose: Secondary | ICD-10-CM | POA: Diagnosis not present

## 2015-03-26 DIAGNOSIS — I10 Essential (primary) hypertension: Secondary | ICD-10-CM | POA: Diagnosis not present

## 2015-03-26 DIAGNOSIS — G4733 Obstructive sleep apnea (adult) (pediatric): Secondary | ICD-10-CM | POA: Diagnosis not present

## 2015-03-26 DIAGNOSIS — I259 Chronic ischemic heart disease, unspecified: Secondary | ICD-10-CM | POA: Diagnosis not present

## 2015-03-31 DIAGNOSIS — E559 Vitamin D deficiency, unspecified: Secondary | ICD-10-CM | POA: Diagnosis not present

## 2015-03-31 DIAGNOSIS — E7800 Pure hypercholesterolemia, unspecified: Secondary | ICD-10-CM | POA: Diagnosis not present

## 2015-03-31 DIAGNOSIS — R7301 Impaired fasting glucose: Secondary | ICD-10-CM | POA: Diagnosis not present

## 2015-03-31 DIAGNOSIS — E539 Vitamin B deficiency, unspecified: Secondary | ICD-10-CM | POA: Diagnosis not present

## 2015-04-08 DIAGNOSIS — E538 Deficiency of other specified B group vitamins: Secondary | ICD-10-CM | POA: Diagnosis not present

## 2015-04-16 DIAGNOSIS — E538 Deficiency of other specified B group vitamins: Secondary | ICD-10-CM | POA: Diagnosis not present

## 2015-04-22 DIAGNOSIS — E538 Deficiency of other specified B group vitamins: Secondary | ICD-10-CM | POA: Diagnosis not present

## 2015-04-23 DIAGNOSIS — G4733 Obstructive sleep apnea (adult) (pediatric): Secondary | ICD-10-CM | POA: Diagnosis not present

## 2015-04-23 DIAGNOSIS — I10 Essential (primary) hypertension: Secondary | ICD-10-CM | POA: Diagnosis not present

## 2015-04-23 DIAGNOSIS — I259 Chronic ischemic heart disease, unspecified: Secondary | ICD-10-CM | POA: Diagnosis not present

## 2015-04-29 DIAGNOSIS — E538 Deficiency of other specified B group vitamins: Secondary | ICD-10-CM | POA: Diagnosis not present

## 2015-05-03 ENCOUNTER — Encounter: Payer: Self-pay | Admitting: Adult Health

## 2015-05-03 ENCOUNTER — Ambulatory Visit (INDEPENDENT_AMBULATORY_CARE_PROVIDER_SITE_OTHER): Payer: Medicare Other | Admitting: Adult Health

## 2015-05-03 VITALS — BP 128/84 | HR 69 | Temp 97.9°F | Ht 67.0 in | Wt 340.0 lb

## 2015-05-03 DIAGNOSIS — G4733 Obstructive sleep apnea (adult) (pediatric): Secondary | ICD-10-CM | POA: Diagnosis not present

## 2015-05-03 NOTE — Progress Notes (Signed)
Subjective:    Patient ID: Megan Atkinson, female    DOB: 1951-01-24, 65 y.o.   MRN: [id number]  HPI 65 yo female seen for sleep consult 11/2014 found to have moderate OSA on sleep study.   TEST  HST 12/09/14 - AHI: 27.4/hr Lowest desat 79%, 92% of the time    05/03/2015 Follow up : Moderate OSA  Pt returns for follow up for sleep apnea Found to have moderate OSA on HST in Nov 2016 -AHI 27 She was set up for a CPAP titration lab study with optimal control at 14cmH20.  Download shows ok complaince with avg usage 3.5hr . AHI 1.2.  Has not noticed more difference in energy level.  Feels the mask is unnatural with maybe too much pressure.  We discussed getting more hours in each night.  Denies chest pain, orthopnea, or fever.    Past Medical History  Diagnosis Date  . Obesity   . Hyperlipidemia   . CAD (coronary artery disease)     small NSTEMI 11/12: LHC 01/06/11:  LAD 90-95%, mD1 90%, pD2 50%,  pOM1 50%, RCA occluded,  EF > 65%;  CABG  12/12: L-LAD  . Glucose intolerance (impaired glucose tolerance)     Hemoglobin A1c 6.4 in 12/2010  . HTN (hypertension)     Echo 01/06/11: EF 65%  . Diabetes mellitus    Current Outpatient Prescriptions on File Prior to Visit  Medication Sig Dispense Refill  . aspirin 325 MG tablet Take 325 mg by mouth daily.      . furosemide (LASIX) 20 MG tablet Take 1 tablet (20 mg total) by mouth daily. 30 tablet 0  . Ginkgo 60 MG TABS Take 2 tablets by mouth daily. For memory     . glucosamine-chondroitin 500-400 MG tablet Take 1 tablet by mouth daily.      Chilton Si Tea, Camillia sinensis, 1000 MG TABS Take 1 tablet by mouth daily.     . metoprolol (LOPRESSOR) 50 MG tablet TAKE ONE TABLET BY MOUTH TWICE DAILY 60 tablet 6  . pravastatin (PRAVACHOL) 80 MG tablet Take 1 tablet (80 mg total) by mouth daily. 30 tablet 6  . ibuprofen (ADVIL,MOTRIN) 200 MG tablet Take 200 mg by mouth every 6 (six) hours as needed. Reported on 05/03/2015     No current  facility-administered medications on file prior to visit.      Review of Systems    Constitutional:   No  weight loss, night sweats,  Fevers, chills,  +fatigue, or  lassitude.  HEENT:   No headaches,  Difficulty swallowing,  Tooth/dental problems, or  Sore throat,                No sneezing, itching, ear ache, nasal congestion, post nasal drip,   CV:  No chest pain,  Orthopnea, PND, swelling in lower extremities, anasarca, dizziness, palpitations, syncope.   GI  No heartburn, indigestion, abdominal pain, nausea, vomiting, diarrhea, change in bowel habits, loss of appetite, bloody stools.   Resp:    No chest wall deformity  Skin: no rash or lesions.  GU: no dysuria, change in color of urine, no urgency or frequency.  No flank pain, no hematuria   MS:  No joint pain or swelling.  No decreased range of motion.  No back pain.  Psych:  No change in mood or affect. No depression or anxiety.  No memory loss.       Objective:   Physical Exam Filed Vitals:  05/03/15 0900  BP: 128/84  Pulse: 69  Temp: 97.9 F (36.6 C)  TempSrc: Oral  Height: 5\' 7"  (1.702 m)  Weight: 340 lb (154.223 kg)  SpO2: 98%   GEN: A/Ox3; pleasant , NAD, morbidly obese   HEENT:  [place]/AT,  EACs-clear, TMs-wnl, NOSE-clear, THROAT-clear, no lesions, no postnasal drip or exudate noted. Class 2 MP airway   NECK:  Supple w/ fair ROM; no JVD; normal carotid impulses w/o bruits; no thyromegaly or nodules palpated; no lymphadenopathy.  RESP  Clear  P & A; w/o, wheezes/ rales/ or rhonchi.no accessory muscle use, no dullness to percussion  CARD:  RRR, no m/r/g  , tr  peripheral edema, pulses intact, no cyanosis or clubbing.  GI:   Soft & nt; nml bowel sounds; no organomegaly or masses detected.  Musco: Warm bil, no deformities or joint swelling noted.   Neuro: alert, no focal deficits noted.    Skin: Warm, no lesions or rashes  Tammy Parrett NP-C  [facility]  05/03/15         Assessment & Plan:

## 2015-05-03 NOTE — Addendum Note (Signed)
Addended by: Karalee HeightOX, Annie Saephan P on: 05/03/2015 09:58 AM   Modules accepted: Orders

## 2015-05-03 NOTE — Assessment & Plan Note (Signed)
Improved control on CPAP   Plan  Continue on CPAP At bedtime  .  Keep up good work  Goal is to wear at least 4-6hr each night.  Adjust CPAP pressure to 13cmH2O.  Work on weight loss.  Follow up Dr. Vassie LollAlva  In 4-6 months and As needed

## 2015-05-03 NOTE — Patient Instructions (Signed)
Continue on CPAP At bedtime  .  Keep up good work  Goal is to wear at least 4-6hr each night.  Adjust CPAP pressure to 13cmH2O.  Work on weight loss.  Follow up Dr. Vassie LollAlva  In 4-6 months and As needed

## 2015-05-04 NOTE — Progress Notes (Signed)
Reviewed & agree with plan  

## 2015-05-11 ENCOUNTER — Encounter: Payer: Self-pay | Admitting: Pulmonary Disease

## 2015-05-13 ENCOUNTER — Encounter: Payer: Self-pay | Admitting: Adult Health

## 2015-05-24 DIAGNOSIS — G4733 Obstructive sleep apnea (adult) (pediatric): Secondary | ICD-10-CM | POA: Diagnosis not present

## 2015-05-24 DIAGNOSIS — I10 Essential (primary) hypertension: Secondary | ICD-10-CM | POA: Diagnosis not present

## 2015-05-24 DIAGNOSIS — I259 Chronic ischemic heart disease, unspecified: Secondary | ICD-10-CM | POA: Diagnosis not present

## 2015-06-30 DIAGNOSIS — I259 Chronic ischemic heart disease, unspecified: Secondary | ICD-10-CM | POA: Diagnosis not present

## 2015-06-30 DIAGNOSIS — G4733 Obstructive sleep apnea (adult) (pediatric): Secondary | ICD-10-CM | POA: Diagnosis not present

## 2015-06-30 DIAGNOSIS — I10 Essential (primary) hypertension: Secondary | ICD-10-CM | POA: Diagnosis not present

## 2015-08-17 ENCOUNTER — Other Ambulatory Visit: Payer: Self-pay | Admitting: Cardiology

## 2015-08-23 ENCOUNTER — Other Ambulatory Visit: Payer: Self-pay

## 2015-08-23 DIAGNOSIS — I10 Essential (primary) hypertension: Secondary | ICD-10-CM | POA: Diagnosis not present

## 2015-08-23 DIAGNOSIS — G4733 Obstructive sleep apnea (adult) (pediatric): Secondary | ICD-10-CM | POA: Diagnosis not present

## 2015-08-23 DIAGNOSIS — I259 Chronic ischemic heart disease, unspecified: Secondary | ICD-10-CM | POA: Diagnosis not present

## 2015-08-23 MED ORDER — PRAVASTATIN SODIUM 80 MG PO TABS: 80.0000 mg | ORAL_TABLET | Freq: Every day | ORAL | Status: DC

## 2015-09-02 ENCOUNTER — Encounter: Payer: Self-pay | Admitting: Pulmonary Disease

## 2015-09-02 ENCOUNTER — Ambulatory Visit (INDEPENDENT_AMBULATORY_CARE_PROVIDER_SITE_OTHER): Payer: Medicare Other | Admitting: Pulmonary Disease

## 2015-09-02 DIAGNOSIS — G4733 Obstructive sleep apnea (adult) (pediatric): Secondary | ICD-10-CM

## 2015-09-02 DIAGNOSIS — R0600 Dyspnea, unspecified: Secondary | ICD-10-CM | POA: Diagnosis not present

## 2015-09-02 MED ORDER — FUROSEMIDE 20 MG PO TABS: 20.0000 mg | ORAL_TABLET | Freq: Every day | ORAL | 0 refills | Status: DC

## 2015-09-02 NOTE — Assessment & Plan Note (Signed)
CPAP is very effective and has helped  Her with daytime fatigue.Only issue seems to be mild dryness  Due to excessive leak from her mouth   Change CPAP to auto settings 5-13 cm Check download in one month Call us back if dryness persists and we can change to a full face mask

## 2015-09-02 NOTE — Patient Instructions (Signed)
Change CPAP to auto settings 5-13 cm Check download in one month Call us back if dryness persists and we can change to a full face mask  Refill on Lasix 1

## 2015-09-02 NOTE — Progress Notes (Signed)
   Subjective:    Patient ID: Megan Atkinson, female    DOB: [birth year], 65 y.o.   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 She also reports dyspnea on exertion. She sees cardiology for CAD, status post CABG 2012, echo 10/2014 showed normal LV function. She has chronic bilateral lower extremity lymphedema. Since her CABG she reports dyspnea on exertion  09/02/2015  Chief Complaint  Patient presents with  . Sleep Apnea    Doing ok on CPAP.  Pt states that she doesn't feel like she is getting a good seal.    She was placed on CPAP 13 cm with good results. Snoring has stopped completely Download shows good control of eve and large leak-she uses nasal pillows so this is likely a leak from her mouth. She is able to tolerate pressure well Compliance is good about 5 hours per night on average with no missed nights  She was given prescription for Lasix last time and this has helped with  Puffiness of her hands and legs.dyspnea has improved somewhat and is worse with extreme heat or cold    Significant tests/ events  HST 12/09/14 - AHI: 27.4/hr Lowest desat 79%, 92% of the time CPAP titration 02/2015 optimal control at 14cmH20.  Download shows ok complaince with avg usage 3.5hr . AHI 1.2.   Review of Systems Patient denies significant dyspnea,cough, hemoptysis,  chest pain, palpitations, pedal edema, orthopnea, paroxysmal nocturnal dyspnea, lightheadedness, nausea, vomiting, abdominal or  leg pains      Objective:   Physical Exam  Gen. Pleasant, obese, in no distress ENT - no lesions, no post nasal drip Neck: No JVD, no thyromegaly, no carotid bruits Lungs: no use of accessory muscles, no dullness to percussion, decreased without rales or rhonchi  Cardiovascular: Rhythm regular, heart sounds  normal, no murmurs or gallops, 2+ peripheral edema Musculoskeletal: No deformities, no cyanosis or clubbing , no  tremors       Assessment & Plan:

## 2015-09-02 NOTE — Assessment & Plan Note (Signed)
Refill on Lasix 1

## 2015-09-16 ENCOUNTER — Encounter: Payer: Self-pay | Admitting: Pulmonary Disease

## 2015-09-23 ENCOUNTER — Other Ambulatory Visit: Payer: Self-pay | Admitting: Cardiology

## 2015-09-23 ENCOUNTER — Other Ambulatory Visit: Payer: Self-pay | Admitting: Pulmonary Disease

## 2015-09-23 DIAGNOSIS — G4733 Obstructive sleep apnea (adult) (pediatric): Secondary | ICD-10-CM

## 2015-09-23 DIAGNOSIS — R0600 Dyspnea, unspecified: Secondary | ICD-10-CM

## 2015-09-24 NOTE — Telephone Encounter (Signed)
Rx(s) sent to pharmacy electronically.  

## 2015-10-26 ENCOUNTER — Encounter: Payer: Self-pay | Admitting: Cardiology

## 2015-11-03 NOTE — Progress Notes (Signed)
HPI: FU CAD. She was admitted 11/12 with an NSTEMI. LHC 01/06/11: LAD 90-95%, mid D1 90%, proximal D2 50%, proximal OM 50%, RCA occluded, EF greater than 65%. She was referred for bypass. This was done with Dr. Laneta Simmers 01/12/11: LIMA-LAD. The distal RCA was diffusely diseased and not thought to be graftable. Saphenous vein was fairly small and not felt suitable as a conduit. Echocardiogram 01/06/11: EF 65%. PreCABG Dopplers: No ICA stenosis. Echocardiogram September 2016 showed normal LV systolic function, grade 1 diastolic dysfunction, mild left atrial enlargement and mild tricuspid regurgitation. Since last seen, the patient has dyspnea with more extreme activities but not with routine activities. It is relieved with rest. It is not associated with chest pain. There is no orthopnea, PND or pedal edema. There is no syncope or palpitations. There is no exertional chest pain.   Current Outpatient Prescriptions  Medication Sig Dispense Refill  . aspirin 325 MG tablet Take 325 mg by mouth daily.      . Calcium Carbonate-Vitamin D (CALCIUM-VITAMIN D) 500-200 MG-UNIT tablet Take 1 tablet by mouth daily.    . Cyanocobalamin (VITAMIN B-12 IJ) weekly    . furosemide (LASIX) 20 MG tablet TAKE ONE TABLET BY MOUTH ONCE DAILY 30 tablet 0  . Ginkgo 60 MG TABS Take 2 tablets by mouth daily. For memory     . glucosamine-chondroitin 500-400 MG tablet Take 1 tablet by mouth daily.      Chilton Si Tea, Camillia sinensis, 1000 MG TABS Take 1 tablet by mouth daily.     . metoprolol (LOPRESSOR) 50 MG tablet TAKE ONE TABLET BY MOUTH TWICE DAILY 60 tablet 2  . pravastatin (PRAVACHOL) 80 MG tablet TAKE ONE TABLET BY MOUTH ONCE DAILY 90 tablet 2   No current facility-administered medications for this visit.      Past Medical History:  Diagnosis Date  . CAD (coronary artery disease)    small NSTEMI 11/12: LHC 01/06/11:  LAD 90-95%, mD1 90%, pD2 50%,  pOM1 50%, RCA occluded,  EF > 65%;  CABG  12/12: L-LAD  .  Diabetes mellitus   . Glucose intolerance (impaired glucose tolerance)    Hemoglobin A1c 6.4 in 12/2010  . HTN (hypertension)    Echo 01/06/11: EF 65%  . Hyperlipidemia   . Obesity     Past Surgical History:  Procedure Laterality Date  . ABDOMINAL HYSTERECTOMY    . APPENDECTOMY    . CORONARY ARTERY BYPASS GRAFT  01/12/2011   Procedure: CORONARY ARTERY BYPASS GRAFTING (CABG);  Surgeon: Alleen Borne, MD;  Location: [facility] OR;  Service: Open Heart Surgery;  Laterality: N/A;  coronary artery bypass graft times one using left internal mammary artery . Attempted endoscopic saphenous vein harvest  . LEFT HEART CATHETERIZATION WITH CORONARY ANGIOGRAM N/A 01/06/2011   Procedure: LEFT HEART CATHETERIZATION WITH CORONARY ANGIOGRAM;  Surgeon: Rollene Rotunda, MD;  Location: [facility] CATH LAB;  Service: Cardiovascular;  Laterality: N/A;    Social History   Social History  . Marital status: Single    Spouse name: N/A  . Number of children: 2  . Years of education: N/A   Occupational History  .  [employer]   Social History Main Topics  . Smoking status: Never Smoker  . Smokeless tobacco: Never Used  . Alcohol use Yes     Comment: Occasional  . Drug use: No  . Sexual activity: Not on file   Other Topics Concern  . Not on file   Social History  Narrative  . No narrative on file    Family History  Problem Relation Age of Onset  . Coronary artery disease      No family history    ROS: no fevers or chills, productive cough, hemoptysis, dysphasia, odynophagia, melena, hematochezia, dysuria, hematuria, rash, seizure activity, orthopnea, PND, pedal edema, claudication. Remaining systems are negative.  Physical Exam: Well-developed obese in no acute distress.  Skin is warm and dry.  HEENT is normal.  Neck is supple.  Chest is clear to auscultation with normal expansion.  Cardiovascular exam is regular rate and rhythm.  Abdominal exam nontender or distended. No masses palpated. Extremities show no  edema. neuro grossly intact  ECG-Sinus rhythm, left ventricular hypertrophy, anterior infarct.  A/P  1 coronary artery disease-continue aspirin and statin.  2 hypertension-blood pressure controlled. Continue present medications. Check potassium and renal function.  3 hyperlipidemia-continue statin. Check lipids and liver.  4 dyspnea-improved; continue CPAP. Follow-up with pulmonary.  5 obesity-discussed weight loss, exercise and diet.  Olga MillersBrian Crenshaw, MD

## 2015-11-08 ENCOUNTER — Encounter: Payer: Self-pay | Admitting: Cardiology

## 2015-11-08 ENCOUNTER — Ambulatory Visit (INDEPENDENT_AMBULATORY_CARE_PROVIDER_SITE_OTHER): Payer: Medicare Other | Admitting: Cardiology

## 2015-11-08 VITALS — BP 138/94 | HR 64 | Ht 67.0 in | Wt 339.0 lb

## 2015-11-08 DIAGNOSIS — Z23 Encounter for immunization: Secondary | ICD-10-CM

## 2015-11-08 DIAGNOSIS — I2510 Atherosclerotic heart disease of native coronary artery without angina pectoris: Secondary | ICD-10-CM

## 2015-11-08 DIAGNOSIS — E785 Hyperlipidemia, unspecified: Secondary | ICD-10-CM

## 2015-11-08 DIAGNOSIS — I10 Essential (primary) hypertension: Secondary | ICD-10-CM

## 2015-11-08 LAB — BASIC METABOLIC PANEL
BUN: 24 mg/dL (ref 7–25)
CALCIUM: 9.5 mg/dL (ref 8.6–10.4)
CHLORIDE: 108 mmol/L (ref 98–110)
CO2: 24 mmol/L (ref 20–31)
CREATININE: 1.08 mg/dL — AB (ref 0.50–0.99)
Glucose, Bld: 94 mg/dL (ref 65–99)
Potassium: 4.5 mmol/L (ref 3.5–5.3)
Sodium: 140 mmol/L (ref 135–146)

## 2015-11-08 LAB — LIPID PANEL
CHOLESTEROL: 198 mg/dL (ref 125–200)
HDL: 50 mg/dL (ref >=46)
LDL Cholesterol: 127 mg/dL (ref <130)
TRIGLYCERIDES: 103 mg/dL (ref <150)
Total CHOL/HDL Ratio: 4 Ratio (ref <=5.0)
VLDL: 21 mg/dL (ref <30)

## 2015-11-08 LAB — HEPATIC FUNCTION PANEL
ALBUMIN: 3.8 g/dL (ref 3.6–5.1)
ALK PHOS: 93 U/L (ref 33–130)
ALT: 8 U/L (ref 6–29)
AST: 9 U/L — AB (ref 10–35)
BILIRUBIN INDIRECT: 0.2 mg/dL (ref 0.2–1.2)
Bilirubin, Direct: 0.1 mg/dL (ref <=0.2)
TOTAL PROTEIN: 6.8 g/dL (ref 6.1–8.1)
Total Bilirubin: 0.3 mg/dL (ref 0.2–1.2)

## 2015-11-08 NOTE — Patient Instructions (Signed)
Medication Instructions:   NO CHANGE  Labwork:  Your physician recommends that you HAVE LAB WORK TODAY  Follow-Up:  Your physician wants you to follow-up in: ONE YEAR WITH DR CRENSHAW You will receive a reminder letter in the mail two months in advance. If you don't receive a letter, please call our office to schedule the follow-up appointment.   If you need a refill on your cardiac medications before your next appointment, please call your pharmacy.    

## 2015-11-09 ENCOUNTER — Telehealth: Payer: Self-pay | Admitting: *Deleted

## 2015-11-09 NOTE — Telephone Encounter (Signed)
-----   Message from Lewayne BuntingBrian S Crenshaw, MD sent at 11/09/2015  5:05 AM EDT ----- Dc pravachol, lipitor 80 mg daily, lipids and liver 4 weeks Olga MillersBrian Crenshaw

## 2015-11-09 NOTE — Telephone Encounter (Signed)
Spoke with pt, she is not willing to try the lipitor because it is on the bad drug list. She reports it causes a lot of problems with diabetes and she would like to continue on the pravastatin. Pt given the okay to continue with her present.

## 2015-11-17 ENCOUNTER — Telehealth: Payer: Self-pay | Admitting: Pulmonary Disease

## 2015-11-17 DIAGNOSIS — G4733 Obstructive sleep apnea (adult) (pediatric): Secondary | ICD-10-CM

## 2015-11-17 NOTE — Telephone Encounter (Signed)
Per Dr. Vassie LollAlva:  Megan Atkinson shows that we need to change pressure to 8cm. Patient aware, order has been placed.  Nothing further needed.

## 2015-12-24 ENCOUNTER — Encounter: Payer: Self-pay | Admitting: Pulmonary Disease

## 2016-01-19 DIAGNOSIS — H524 Presbyopia: Secondary | ICD-10-CM | POA: Diagnosis not present

## 2016-02-26 ENCOUNTER — Encounter: Payer: Self-pay | Admitting: Adult Health

## 2016-02-29 ENCOUNTER — Ambulatory Visit (INDEPENDENT_AMBULATORY_CARE_PROVIDER_SITE_OTHER): Payer: Medicare Other | Admitting: Adult Health

## 2016-02-29 ENCOUNTER — Encounter: Payer: Self-pay | Admitting: Adult Health

## 2016-02-29 DIAGNOSIS — G4733 Obstructive sleep apnea (adult) (pediatric): Secondary | ICD-10-CM | POA: Diagnosis not present

## 2016-02-29 NOTE — Assessment & Plan Note (Signed)
Wt loss encourged

## 2016-02-29 NOTE — Progress Notes (Signed)
 @Patient  ID: Megan Atkinson, female    DOB: 06/11/1950, 66 y.o.   MRN: [id number]  No chief complaint on file.   Referring provider: Etta GrandchildJones, Thomas L, MD  HPI: 66 yo female seen for sleep consult 11/2014 found to have moderate OSA on sleep study.   TEST  HST 12/09/14 - AHI: 27.4/hr Lowest desat 79%, 92% of the time  02/29/2016 Follow up : Moderate OSA  Patient returns for a six-month follow-up for moderate sleep apnea. Patient says she is doing well on C Pap. Does occasionally miss some nights. But says overall she has been wearing it about 4 hours. Feels rested Download shows 70% compliance with average usage around 5 hours. She is on a set pressure of 8 cm of H2O. This was a recent pressure change. Last visit. AHI 1.3.  Allergies  Allergen Reactions  . Lisinopril     cough  . Metformin And Related Diarrhea    A lot of fluid loss from bowel    Immunization History  Administered Date(s) Administered  . Influenza Whole 01/18/2011  . Influenza,inj,Quad PF,36+ Mos 11/24/2014, 11/08/2015  . Tdap 04/05/2011    Past Medical History:  Diagnosis Date  . CAD (coronary artery disease)    small NSTEMI 11/12: LHC 01/06/11:  LAD 90-95%, mD1 90%, pD2 50%,  pOM1 50%, RCA occluded,  EF > 65%;  CABG  12/12: L-LAD  . Diabetes mellitus   . Glucose intolerance (impaired glucose tolerance)    Hemoglobin A1c 6.4 in 12/2010  . HTN (hypertension)    Echo 01/06/11: EF 65%  . Hyperlipidemia   . Obesity     Tobacco History: History  Smoking Status  . Never Smoker  Smokeless Tobacco  . Never Used   Counseling given: Not Answered   Outpatient Encounter Prescriptions as of 02/29/2016  Medication Sig  . aspirin 325 MG tablet Take 325 mg by mouth daily.    . Calcium Carbonate-Vitamin D (CALCIUM-VITAMIN D) 500-200 MG-UNIT tablet Take 1 tablet by mouth daily.  . Cyanocobalamin (VITAMIN B-12 IJ) weekly  . furosemide (LASIX) 20 MG tablet TAKE ONE TABLET BY MOUTH ONCE DAILY  . Ginkgo 60 MG  TABS Take 1 tablet by mouth daily. For memory   . glucosamine-chondroitin 500-400 MG tablet Take 1 tablet by mouth daily.    Chilton Si. Green Tea, Camillia sinensis, 1000 MG TABS Take 1 tablet by mouth daily.   . metoprolol (LOPRESSOR) 50 MG tablet TAKE ONE TABLET BY MOUTH TWICE DAILY  . pravastatin (PRAVACHOL) 80 MG tablet TAKE ONE TABLET BY MOUTH ONCE DAILY   No facility-administered encounter medications on file as of 02/29/2016.      Review of Systems  Constitutional:   No  weight loss, night sweats,  Fevers, chills, fatigue, or  lassitude.  HEENT:   No headaches,  Difficulty swallowing,  Tooth/dental problems, or  Sore throat,                No sneezing, itching, ear ache, nasal congestion, post nasal drip,   CV:  No chest pain,  Orthopnea, PND, swelling in lower extremities, anasarca, dizziness, palpitations, syncope.   GI  No heartburn, indigestion, abdominal pain, nausea, vomiting, diarrhea, change in bowel habits, loss of appetite, bloody stools.   Resp: No shortness of breath with exertion or at rest.  No excess mucus, no productive cough,  No non-productive cough,  No coughing up of blood.  No change in color of mucus.  No wheezing.  No chest wall deformity  Skin: no rash or lesions.  GU: no dysuria, change in color of urine, no urgency or frequency.  No flank pain, no hematuria   MS:  No joint pain or swelling.  No decreased range of motion.  No back pain.    Physical Exam  BP 138/70   Pulse 63   Temp 98 F (36.7 C) (Oral)   Ht 5\' 7"  (1.702 m)   Wt (!) 347 lb 6.4 oz (157.6 kg)   SpO2 95%   BMI 54.41 kg/m   GEN: A/Ox3; pleasant , NAD , Obese    HEENT:  [place]/AT,  EACs-clear, TMs-wnl, NOSE-clear, THROAT-clear, no lesions, no postnasal drip or exudate noted. Class 2-3 MP airway   NECK:  Supple w/ fair ROM; no JVD; normal carotid impulses w/o bruits; no thyromegaly or nodules palpated; no lymphadenopathy.    RESP  Clear  P & A; w/o, wheezes/ rales/ or rhonchi. no accessory  muscle use, no dullness to percussion  CARD:  RRR, no m/r/g, 1+ peripheral edema, pulses intact, no cyanosis or clubbing.  GI:   Soft & nt; nml bowel sounds; no organomegaly or masses detected.   Musco: Warm bil, no deformities or joint swelling noted.   Neuro: alert, no focal deficits noted.    Skin: Warm, no lesions or rashes  Psych:  No change in mood or affect. No depression or anxiety.  No memory loss.  Lab Results:  CBC    Component Value Date/Time   WBC 8.5 04/05/2011 1129   RBC 4.67 04/05/2011 1129   HGB 13.8 04/05/2011 1129   HCT 42.6 04/05/2011 1129   PLT 255.0 04/05/2011 1129   MCV 91.4 04/05/2011 1129   MCH 29.4 01/15/2011 0600   MCHC 32.3 04/05/2011 1129   RDW 15.0 (H) 04/05/2011 1129   LYMPHSABS 1.6 04/05/2011 1129   MONOABS 0.6 04/05/2011 1129   EOSABS 0.1 04/05/2011 1129   BASOSABS 0.1 04/05/2011 1129    BMET    Component Value Date/Time   NA 140 11/08/2015 1248   K 4.5 11/08/2015 1248   CL 108 11/08/2015 1248   CO2 24 11/08/2015 1248   GLUCOSE 94 11/08/2015 1248   BUN 24 11/08/2015 1248   CREATININE 1.08 (H) 11/08/2015 1248   CALCIUM 9.5 11/08/2015 1248   GFRNONAA >90 01/15/2011 0600   GFRAA >90 01/15/2011 0600    BNP No results found for: BNP  ProBNP No results found for: PROBNP  Imaging: No results found.   Assessment & Plan:   OSA (obstructive sleep apnea) Good control, encouraged on compliance   Plan  Patient Instructions  Continue on CPAP At bedtime  .  Goal is to wear at least 4-6hr each night.  Work on weight loss.  Follow up Dr. Vassie Loll  In  1 year and As needed       Morbid obesity (HCC) Wt loss encourged      Megan Oaks, NP 02/29/2016

## 2016-02-29 NOTE — Assessment & Plan Note (Signed)
Good control, encouraged on compliance   Plan  Patient Instructions  Continue on CPAP At bedtime  .  Goal is to wear at least 4-6hr each night.  Work on weight loss.  Follow up Dr. Vassie LollAlva  In  1 year and As needed

## 2016-02-29 NOTE — Patient Instructions (Addendum)
Continue on CPAP At bedtime  .  Goal is to wear at least 4-6hr each night.  Work on weight loss.  Follow up Dr. Vassie LollAlva  In  1 year and As needed

## 2016-03-06 ENCOUNTER — Encounter (HOSPITAL_COMMUNITY): Payer: Self-pay | Admitting: Emergency Medicine

## 2016-03-06 ENCOUNTER — Inpatient Hospital Stay (HOSPITAL_COMMUNITY)
Admission: EM | Admit: 2016-03-06 | Discharge: 2016-03-10 | DRG: 065 | Disposition: A | Discharge status: Rehabilitation Facility | Payer: Medicare Other | Attending: Internal Medicine | Admitting: Internal Medicine

## 2016-03-06 ENCOUNTER — Emergency Department (HOSPITAL_COMMUNITY): Payer: Medicare Other

## 2016-03-06 DIAGNOSIS — I252 Old myocardial infarction: Secondary | ICD-10-CM | POA: Diagnosis not present

## 2016-03-06 DIAGNOSIS — M79609 Pain in unspecified limb: Secondary | ICD-10-CM | POA: Diagnosis not present

## 2016-03-06 DIAGNOSIS — R29702 NIHSS score 2: Secondary | ICD-10-CM | POA: Diagnosis present

## 2016-03-06 DIAGNOSIS — E119 Type 2 diabetes mellitus without complications: Secondary | ICD-10-CM | POA: Diagnosis present

## 2016-03-06 DIAGNOSIS — I2510 Atherosclerotic heart disease of native coronary artery without angina pectoris: Secondary | ICD-10-CM | POA: Diagnosis not present

## 2016-03-06 DIAGNOSIS — N3000 Acute cystitis without hematuria: Secondary | ICD-10-CM

## 2016-03-06 DIAGNOSIS — R1313 Dysphagia, pharyngeal phase: Secondary | ICD-10-CM | POA: Diagnosis not present

## 2016-03-06 DIAGNOSIS — R7303 Prediabetes: Secondary | ICD-10-CM

## 2016-03-06 DIAGNOSIS — I10 Essential (primary) hypertension: Secondary | ICD-10-CM | POA: Diagnosis not present

## 2016-03-06 DIAGNOSIS — R001 Bradycardia, unspecified: Secondary | ICD-10-CM | POA: Diagnosis not present

## 2016-03-06 DIAGNOSIS — I6789 Other cerebrovascular disease: Secondary | ICD-10-CM | POA: Diagnosis not present

## 2016-03-06 DIAGNOSIS — I63012 Cerebral infarction due to thrombosis of left vertebral artery: Secondary | ICD-10-CM | POA: Diagnosis not present

## 2016-03-06 DIAGNOSIS — Z79899 Other long term (current) drug therapy: Secondary | ICD-10-CM | POA: Diagnosis not present

## 2016-03-06 DIAGNOSIS — Z7982 Long term (current) use of aspirin: Secondary | ICD-10-CM

## 2016-03-06 DIAGNOSIS — K5901 Slow transit constipation: Secondary | ICD-10-CM | POA: Diagnosis not present

## 2016-03-06 DIAGNOSIS — R269 Unspecified abnormalities of gait and mobility: Secondary | ICD-10-CM | POA: Diagnosis not present

## 2016-03-06 DIAGNOSIS — I63442 Cerebral infarction due to embolism of left cerebellar artery: Secondary | ICD-10-CM | POA: Diagnosis not present

## 2016-03-06 DIAGNOSIS — N390 Urinary tract infection, site not specified: Secondary | ICD-10-CM | POA: Diagnosis not present

## 2016-03-06 DIAGNOSIS — R7989 Other specified abnormal findings of blood chemistry: Secondary | ICD-10-CM | POA: Diagnosis not present

## 2016-03-06 DIAGNOSIS — I6522 Occlusion and stenosis of left carotid artery: Secondary | ICD-10-CM | POA: Diagnosis not present

## 2016-03-06 DIAGNOSIS — R270 Ataxia, unspecified: Secondary | ICD-10-CM | POA: Diagnosis present

## 2016-03-06 DIAGNOSIS — Z888 Allergy status to other drugs, medicaments and biological substances status: Secondary | ICD-10-CM | POA: Diagnosis not present

## 2016-03-06 DIAGNOSIS — E785 Hyperlipidemia, unspecified: Secondary | ICD-10-CM | POA: Diagnosis not present

## 2016-03-06 DIAGNOSIS — Z951 Presence of aortocoronary bypass graft: Secondary | ICD-10-CM

## 2016-03-06 DIAGNOSIS — E1165 Type 2 diabetes mellitus with hyperglycemia: Secondary | ICD-10-CM

## 2016-03-06 DIAGNOSIS — E669 Obesity, unspecified: Secondary | ICD-10-CM

## 2016-03-06 DIAGNOSIS — F411 Generalized anxiety disorder: Secondary | ICD-10-CM | POA: Diagnosis not present

## 2016-03-06 DIAGNOSIS — M7989 Other specified soft tissue disorders: Secondary | ICD-10-CM | POA: Diagnosis present

## 2016-03-06 DIAGNOSIS — I69393 Ataxia following cerebral infarction: Secondary | ICD-10-CM | POA: Diagnosis not present

## 2016-03-06 DIAGNOSIS — D62 Acute posthemorrhagic anemia: Secondary | ICD-10-CM | POA: Diagnosis not present

## 2016-03-06 DIAGNOSIS — I638 Other cerebral infarction: Secondary | ICD-10-CM | POA: Diagnosis not present

## 2016-03-06 DIAGNOSIS — Z23 Encounter for immunization: Secondary | ICD-10-CM

## 2016-03-06 DIAGNOSIS — E1159 Type 2 diabetes mellitus with other circulatory complications: Secondary | ICD-10-CM | POA: Diagnosis not present

## 2016-03-06 DIAGNOSIS — R42 Dizziness and giddiness: Secondary | ICD-10-CM | POA: Diagnosis not present

## 2016-03-06 DIAGNOSIS — R200 Anesthesia of skin: Secondary | ICD-10-CM | POA: Diagnosis not present

## 2016-03-06 DIAGNOSIS — Z6841 Body Mass Index (BMI) 40.0 and over, adult: Secondary | ICD-10-CM | POA: Diagnosis not present

## 2016-03-06 DIAGNOSIS — G4733 Obstructive sleep apnea (adult) (pediatric): Secondary | ICD-10-CM | POA: Diagnosis present

## 2016-03-06 DIAGNOSIS — R29818 Other symptoms and signs involving the nervous system: Secondary | ICD-10-CM | POA: Diagnosis not present

## 2016-03-06 DIAGNOSIS — I639 Cerebral infarction, unspecified: Secondary | ICD-10-CM | POA: Diagnosis present

## 2016-03-06 DIAGNOSIS — I69354 Hemiplegia and hemiparesis following cerebral infarction affecting left non-dominant side: Secondary | ICD-10-CM | POA: Diagnosis not present

## 2016-03-06 DIAGNOSIS — I69398 Other sequelae of cerebral infarction: Secondary | ICD-10-CM | POA: Diagnosis not present

## 2016-03-06 HISTORY — DX: Cerebral infarction, unspecified: I63.9

## 2016-03-06 LAB — DIFFERENTIAL
Basophils Absolute: 0.1 10*3/uL (ref 0.0–0.1)
Basophils Relative: 1 %
EOS PCT: 2 %
Eosinophils Absolute: 0.1 10*3/uL (ref 0.0–0.7)
LYMPHS ABS: 2.1 10*3/uL (ref 0.7–4.0)
LYMPHS PCT: 31 %
Monocytes Absolute: 0.6 10*3/uL (ref 0.1–1.0)
Monocytes Relative: 9 %
NEUTROS ABS: 3.8 10*3/uL (ref 1.7–7.7)
NEUTROS PCT: 57 %

## 2016-03-06 LAB — COMPREHENSIVE METABOLIC PANEL
ALBUMIN: 3.5 g/dL (ref 3.5–5.0)
ALK PHOS: 78 U/L (ref 38–126)
ALT: 15 U/L (ref 14–54)
AST: 14 U/L — AB (ref 15–41)
Anion gap: 10 (ref 5–15)
BILIRUBIN TOTAL: 0.3 mg/dL (ref 0.3–1.2)
BUN: 21 mg/dL — AB (ref 6–20)
CALCIUM: 9.4 mg/dL (ref 8.9–10.3)
CO2: 24 mmol/L (ref 22–32)
CREATININE: 0.98 mg/dL (ref 0.44–1.00)
Chloride: 106 mmol/L (ref 101–111)
GFR calc Af Amer: >60 mL/min (ref >60)
GFR calc non Af Amer: 58 mL/min — ABNORMAL LOW (ref >60)
GLUCOSE: 113 mg/dL — AB (ref 65–99)
Potassium: 3.8 mmol/L (ref 3.5–5.1)
Sodium: 140 mmol/L (ref 135–145)
TOTAL PROTEIN: 7 g/dL (ref 6.5–8.1)

## 2016-03-06 LAB — CBC
HCT: 40.3 % (ref 36.0–46.0)
HEMOGLOBIN: 12.9 g/dL (ref 12.0–15.0)
MCH: 30.1 pg (ref 26.0–34.0)
MCHC: 32 g/dL (ref 30.0–36.0)
MCV: 94.2 fL (ref 78.0–100.0)
Platelets: 224 10*3/uL (ref 150–400)
RBC: 4.28 MIL/uL (ref 3.87–5.11)
RDW: 13.9 % (ref 11.5–15.5)
WBC: 6.6 10*3/uL (ref 4.0–10.5)

## 2016-03-06 LAB — GLUCOSE, CAPILLARY: GLUCOSE-CAPILLARY: 129 mg/dL — AB (ref 65–99)

## 2016-03-06 LAB — APTT: aPTT: 30 seconds (ref 24–36)

## 2016-03-06 LAB — PROTIME-INR
INR: 0.97
Prothrombin Time: 12.9 seconds (ref 11.4–15.2)

## 2016-03-06 LAB — ETHANOL: Alcohol, Ethyl (B): <5 mg/dL (ref <5)

## 2016-03-06 LAB — CBG MONITORING, ED: Glucose-Capillary: 103 mg/dL — ABNORMAL HIGH (ref 65–99)

## 2016-03-06 MED ORDER — ACETAMINOPHEN 650 MG RE SUPP: 650.0000 mg | RECTAL | Status: DC | PRN

## 2016-03-06 MED ORDER — ASPIRIN 300 MG RE SUPP: 300.0000 mg | Freq: Every day | RECTAL | Status: DC

## 2016-03-06 MED ORDER — ENOXAPARIN SODIUM 40 MG/0.4ML ~~LOC~~ SOLN
40.0000 mg | SUBCUTANEOUS | Status: DC
  Administered 2016-03-06 – 2016-03-09 (×4): 40 mg via SUBCUTANEOUS
  Filled 2016-03-06 (×5): qty 0.4

## 2016-03-06 MED ORDER — ACETAMINOPHEN 160 MG/5ML PO SOLN: 650.0000 mg | ORAL | Status: DC | PRN

## 2016-03-06 MED ORDER — ASPIRIN 325 MG PO TABS: 325.0000 mg | ORAL_TABLET | Freq: Every day | ORAL | Status: DC

## 2016-03-06 MED ORDER — ACETAMINOPHEN 325 MG PO TABS: 650.0000 mg | ORAL_TABLET | ORAL | Status: DC | PRN

## 2016-03-06 MED ORDER — SODIUM CHLORIDE 0.9 % IV SOLN: INTRAVENOUS | Status: AC

## 2016-03-06 MED ORDER — ONDANSETRON HCL 4 MG/2ML IJ SOLN
4.0000 mg | Freq: Three times a day (TID) | INTRAMUSCULAR | Status: DC | PRN
  Administered 2016-03-06: 4 mg via INTRAVENOUS
  Filled 2016-03-06: qty 2

## 2016-03-06 MED ORDER — ASPIRIN 300 MG RE SUPP
300.0000 mg | Freq: Once | RECTAL | Status: AC
  Administered 2016-03-06: 300 mg via RECTAL
  Filled 2016-03-06: qty 1

## 2016-03-06 MED ORDER — ASPIRIN 300 MG RE SUPP: 300.0000 mg | Freq: Once | RECTAL | Status: DC

## 2016-03-06 MED ORDER — STROKE: EARLY STAGES OF RECOVERY BOOK
Freq: Once | Status: AC
  Administered 2016-03-07: 23:00:00
  Filled 2016-03-06: qty 1

## 2016-03-06 MED ORDER — INSULIN ASPART 100 UNIT/ML ~~LOC~~ SOLN
0.0000 [IU] | Freq: Three times a day (TID) | SUBCUTANEOUS | Status: DC
  Administered 2016-03-07: 2 [IU] via SUBCUTANEOUS
  Administered 2016-03-08 – 2016-03-09 (×2): 1 [IU] via SUBCUTANEOUS

## 2016-03-06 NOTE — ED Notes (Signed)
PT CBG was 108

## 2016-03-06 NOTE — ED Notes (Signed)
Unable to get Ua. PT stated while vomiting she use the the bathroom. David and I cleaned up PT

## 2016-03-06 NOTE — ED Notes (Signed)
Neurologist at bedside explaining risk and benefits of TPA. Pt lethargic at this time.

## 2016-03-06 NOTE — ED Notes (Signed)
Report attempted 

## 2016-03-06 NOTE — Consult Note (Addendum)
Neurology Consultation Reason for Consult: Vertigo Referring Physician: Leeann MustSteinl, K  CC: Vertigo, left-sided numbness  History is obtained from: Patient  HPI: Megan Atkinson is a 66 y.o. female who presents with sudden onset vertigo and left-sided numbness/weakness that started around 10:30 AM. She states that they could've been his earliest 1910 AM, and that currently she is continuing to have vertigo, but that her left-sided numbness and weakness is significantly improved.  Even on arrival, it appeared that she had some left-sided weakness, but this improved over the course of my examination.  Of note, about a week ago she had similar episode that was transient and improved.  On exam, she had an NIH stroke scale of 2, but I did discuss TPA with her son. One item of concern would be the symptoms that she had last week coupled with a subacute-appearing stroke on CT which I think would raise risk. That coupled with the fact that she has improving symptoms, let me and the discussion the counseled against TPA and the family agreed.   LKW: 10:00 AM tpa given?: no, mild symptoms, possible recent stroke   ROS: A 14 point ROS was performed and is negative except as noted in the HPI.   Past Medical History:  Diagnosis Date  . CAD (coronary artery disease)    small NSTEMI 11/12: LHC 01/06/11:  LAD 90-95%, mD1 90%, pD2 50%,  pOM1 50%, RCA occluded,  EF > 65%;  CABG  12/12: L-LAD  . Diabetes mellitus   . Glucose intolerance (impaired glucose tolerance)    Hemoglobin A1c 6.4 in 12/2010  . HTN (hypertension)    Echo 01/06/11: EF 65%  . Hyperlipidemia   . Obesity      Family History  Problem Relation Age of Onset  . Coronary artery disease      No family history     Social History:  reports that she has never smoked. She has never used smokeless tobacco. She reports that she drinks alcohol. She reports that she does not use drugs.   Exam: Current vital signs: BP 164/67   Pulse 63    Temp 97.4 F (36.3 C) (Oral)   Resp 19   Ht 5\' 7"  (1.702 m)   Wt (!) 161.8 kg (356 lb 11.3 oz)   SpO2 98%   BMI 55.87 kg/m  Vital signs in last 24 hours: Temp:  [97.4 F (36.3 C)] 97.4 F (36.3 C) (01/29 1244) Pulse Rate:  [48-66] 63 (01/29 1530) Resp:  [13-21] 19 (01/29 1530) BP: (152-177)/(61-87) 164/67 (01/29 1530) SpO2:  [97 %-100 %] 98 % (01/29 1530) Weight:  [161.8 kg (356 lb 11.3 oz)] 161.8 kg (356 lb 11.3 oz) (01/29 1200)   Physical Exam  Constitutional: Appears well-developed and well-nourished.  Psych: Affect appropriate to situation Eyes: No scleral injection HENT: No OP obstrucion Head: Normocephalic.  Cardiovascular: Normal rate and regular rhythm.  Respiratory: Effort normal and breath sounds normal to anterior ascultation GI: Soft.  No distension. There is no tenderness.  Skin: WDI  Neuro: Mental Status: Patient is awake, alert, oriented to person, place, month, year, and situation. Patient is able to give a clear and coherent history. No signs of aphasia or neglect Cranial Nerves: II: Visual Fields are full. Pupils are equal, round, and reactive to light.   III,IV, VI: EOMI without ptosis or diploplia.  V: Facial sensation is symmetric to temperature VII: Facial movement is symmetric.  VIII: hearing is intact to voice X: Uvula elevates symmetrically XI: Shoulder  shrug is symmetric. XII: tongue is midline without atrophy or fasciculations.  Motor: Tone is normal. Bulk is normal. 5/5 strength was present in all four extremities.  Sensory: Sensation is diminished in the right arm to pinprick, otherwise intact throughout. Cerebellar: She has mild ataxia on finger-nose-finger on the left, very difficult to test in her lower extremities due to body habitus, but she is able to lift her toes to a target without clear ataxia, but I feel that this is a relatively insensitive test.  I have reviewed labs in epic and the results pertinent to this consultation  are: CMP-unremarkable  I have reviewed the images obtained: CT head-subacute-appearing infarct in the left cerebellum  Impression: 66 year old female with new onset vertigo and mild left ataxia. Given her symptoms were relatively mild, and she has what appears to be a subacute infarct in her cerebellum would not favor IV TPA at this time. I think the predominance of her symptoms are due to vertigo which will improve, and her ataxia is relatively mild.  Recommendations: 1. HgbA1c, fasting lipid panel 2. MRI of the brain without contrast 3. Frequent neuro checks 4. Echocardiogram 5. CTA head and neck 6. Prophylactic therapy-Antiplatelet med: Aspirin - dose 325mg  PO or 300mg  PR 7. Risk factor modification 8. Telemetry monitoring 9. PT consult, OT consult, Speech consult 10. please page stroke NP  Or  PA  Or MD  from 8am -4 pm starting 1/30 as this patient will be followed by the stroke team at this point.   You can look them up on [email or web address]     Ritta Slot, MD [facility] [phone]  If 7pm- 7am, please page neurology on call as listed in AMION.

## 2016-03-06 NOTE — ED Provider Notes (Signed)
Neurology note reviewed. 5:30 PM patient is awake alert speech clear. Patient reportedly failed swallowing screen. Aspirin to be administered rectally. Dr.Gherge consulted and will arrange for inpatient admission to telemetry and further diagnostic testing   Doug SouSam Evann Koelzer, MD 03/06/16 1729

## 2016-03-06 NOTE — ED Provider Notes (Signed)
[facility]-EMERGENCY DEPT Provider Note   CSN: [id number] Arrival date & time: 03/06/16  1226   An emergency department physician performed an initial assessment on this suspected stroke patient at 1216.  History   Chief Complaint Chief Complaint  Patient presents with  . Code Stroke    HPI Clarabelle Oscarson is a 66 y.o. female.  Patient with hx htn, dm, cad, presents c/o feeling generally weak from work, also notes nausea, and states 'felt drunk'.  Vague c/o dizziness, denies current room spinning sensation. No hearing loss or tinnitus. C/o left facial numbness, and EMS notes left sided weakness. Symptoms moderate, persistent. Symptom onset noted approximately 1 hr prior to ED arrival.  Patient denies headache. No change in vision.  States felt fine/at baseline earlier today.    The history is provided by the patient and the EMS personnel. The history is limited by the condition of the patient.    Past Medical History:  Diagnosis Date  . CAD (coronary artery disease)    small NSTEMI 11/12: LHC 01/06/11:  LAD 90-95%, mD1 90%, pD2 50%,  pOM1 50%, RCA occluded,  EF > 65%;  CABG  12/12: L-LAD  . Diabetes mellitus   . Glucose intolerance (impaired glucose tolerance)    Hemoglobin A1c 6.4 in 12/2010  . HTN (hypertension)    Echo 01/06/11: EF 65%  . Hyperlipidemia   . Obesity     Patient Active Problem List   Diagnosis Date Noted  . Morbid obesity (HCC) 02/29/2016  . OSA (obstructive sleep apnea) 11/24/2014  . Dyspnea 10/02/2014  . Anemia 04/05/2011  . Urinary tract infection, site not specified 04/05/2011  . Type II or unspecified type diabetes mellitus without mention of complication, uncontrolled 02/22/2011  . Hyperlipidemia 02/22/2011  . CAD (coronary artery disease) 01/07/2011  . Hypertension 01/07/2011    Past Surgical History:  Procedure Laterality Date  . ABDOMINAL HYSTERECTOMY    . APPENDECTOMY    . CORONARY ARTERY BYPASS GRAFT  01/12/2011   Procedure: CORONARY ARTERY  BYPASS GRAFTING (CABG);  Surgeon: Alleen Borne, MD;  Location: [facility] OR;  Service: Open Heart Surgery;  Laterality: N/A;  coronary artery bypass graft times one using left internal mammary artery . Attempted endoscopic saphenous vein harvest  . LEFT HEART CATHETERIZATION WITH CORONARY ANGIOGRAM N/A 01/06/2011   Procedure: LEFT HEART CATHETERIZATION WITH CORONARY ANGIOGRAM;  Surgeon: Rollene Rotunda, MD;  Location[facility] C[facility] LAB;  Service: Cardiovascular;  Laterality: N/A;    OB History    No data available       Home Medications    Prior to Admission medications   Medication Sig Start Date End Date Taking? Authorizing Provider  aspirin 325 MG tablet Take 325 mg by mouth daily.      Historical Provider, MD  Calcium Carbonate-Vitamin D (CALCIUM-VITAMIN D) 500-200 MG-UNIT tablet Take 1 tablet by mouth daily.    Historical Provider, MD  Cyanocobalamin (VITAMIN B-12 IJ) weekly    Historical Provider, MD  furosemide (LASIX) 20 MG tablet TAKE ONE TABLET BY MOUTH ONCE DAILY 09/23/15   Oretha Milch, MD  Ginkgo 60 MG TABS Take 1 tablet by mouth daily. For memory     Historical Provider, MD  glucosamine-chondroitin 500-400 MG tablet Take 1 tablet by mouth daily.      Historical Provider, MD  Chilton Si Tea, Camillia sinensis, 1000 MG TABS Take 1 tablet by mouth daily.     Historical Provider, MD  metoprolol (LOPRESSOR) 50 MG tablet TAKE ONE TABLET BY MOUTH  TWICE DAILY 09/24/15   Lewayne Bunting, MD  pravastatin (PRAVACHOL) 80 MG tablet TAKE ONE TABLET BY MOUTH ONCE DAILY 09/24/15   Lewayne Bunting, MD    Family History Family History  Problem Relation Age of Onset  . Coronary artery disease      No family history    Social History Social History  Substance Use Topics  . Smoking status: Never Smoker  . Smokeless tobacco: Never Used  . Alcohol use Yes     Comment: Occasional     Allergies   Lisinopril and Metformin and related   Review of Systems Review of Systems  Constitutional: Negative  for fever.  HENT: Negative for trouble swallowing.   Eyes: Negative for visual disturbance.  Respiratory: Negative for shortness of breath.   Cardiovascular: Negative for chest pain.  Gastrointestinal: Positive for nausea. Negative for abdominal pain.  Genitourinary: Negative for dysuria and flank pain.  Musculoskeletal: Negative for back pain and neck pain.  Skin: Negative for rash.  Neurological: Positive for weakness and numbness. Negative for headaches.  Hematological: Does not bruise/bleed easily.  Psychiatric/Behavioral: Negative for confusion.     Physical Exam Updated Vital Signs BP 162/80 (BP Location: Right Arm)   Pulse 65   Temp 97.4 F (36.3 C) (Oral)   Resp 19   Ht 5\' 7"  (1.702 m)   Wt (!) 161.8 kg   SpO2 99%   BMI 55.87 kg/m   Physical Exam  Constitutional: She is oriented to person, place, and time. She appears well-developed and well-nourished. No distress.  HENT:  Head: Atraumatic.  Mouth/Throat: Oropharynx is clear and moist.  Eyes: Conjunctivae and EOM are normal. Pupils are equal, round, and reactive to light. No scleral icterus.  Neck: Neck supple. No tracheal deviation present.  No bruits  Cardiovascular: Normal rate, regular rhythm, normal heart sounds and intact distal pulses.  Exam reveals no gallop and no friction rub.   No murmur heard. Pulmonary/Chest: Effort normal and breath sounds normal. No respiratory distress.  Abdominal: Soft. Normal appearance and bowel sounds are normal. She exhibits no distension. There is no tenderness.  Genitourinary:  Genitourinary Comments: No cva tenderness  Musculoskeletal: She exhibits no edema.  Neurological: She is alert and oriented to person, place, and time. No cranial nerve deficit.  Responds to questions, speech clear. No facial asymmetry or droop noted.  No pronator drift. Equal grip. Impaired finger to nose bil. Gait not tested.   Skin: Skin is warm and dry. No rash noted. She is not diaphoretic.    Psychiatric:  Mildly slow to respond to questions  Nursing note and vitals reviewed.    ED Treatments / Results  Labs (all labs ordered are listed, but only abnormal results are displayed) Results for orders placed or performed in visit on 11/08/15  Lipid panel  Result Value Ref Range   Cholesterol 198 125 - 200 mg/dL   Triglycerides 846 <962 mg/dL   HDL 50 >=95 mg/dL   Total CHOL/HDL Ratio 4.0 <=5.0 Ratio   VLDL 21 <30 mg/dL   LDL Cholesterol 284 <132 mg/dL  Hepatic function panel  Result Value Ref Range   Total Bilirubin 0.3 0.2 - 1.2 mg/dL   Bilirubin, Direct 0.1 <=0.2 mg/dL   Indirect Bilirubin 0.2 0.2 - 1.2 mg/dL   Alkaline Phosphatase 93 33 - 130 U/L   AST 9 (L) 10 - 35 U/L   ALT 8 6 - 29 U/L   Total Protein 6.8 6.1 - 8.1 g/dL  Albumin 3.8 3.6 - 5.1 g/dL  Basic metabolic panel  Result Value Ref Range   Sodium 140 135 - 146 mmol/L   Potassium 4.5 3.5 - 5.3 mmol/L   Chloride 108 98 - 110 mmol/L   CO2 24 20 - 31 mmol/L   Glucose, Bld 94 65 - 99 mg/dL   BUN 24 7 - 25 mg/dL   Creat 1.61 (H) 0.96 - 0.99 mg/dL   Calcium 9.5 8.6 - 04.5 mg/dL   Ct Head Code Stroke W/o Cm  Result Date: 03/06/2016 CLINICAL DATA:  Code stroke.  Left-sided weakness and numbness. EXAM: CT HEAD WITHOUT CONTRAST TECHNIQUE: Contiguous axial images were obtained from the base of the skull through the vertex without intravenous contrast. COMPARISON:  None. FINDINGS: Brain: There is patchy infarct in the inferior left cerebellum, favored subacute. Mild patchy low-density in the bilateral cerebral white matter, likely chronic microvascular ischemia in this patient with multiple vascular risk factors. No acute hemorrhage, hydrocephalus, or mass. Vascular: No hyperdense vessel.  Atherosclerotic calcification. Skull: Hyperostosis.  No acute or aggressive finding. Sinuses/Orbits: Gaze to the left, nonspecific. Other: Critical Value/emergent results were called by telephone at the time of interpretation on  03/06/2016 at 12:45 pm to Dr. Amada Jupiter, who verbally acknowledged these results. ASPECTS [facility] Stroke Program Early CT Score) - Ganglionic level infarction (caudate, lentiform nuclei, internal capsule, insula, M1-M3 cortex): 7 - Supraganglionic infarction (M4-M6 cortex): 3 Total score (0-10 with 10 being normal): 10 IMPRESSION: 1. Moderate area of inferior left cerebellar infarction, favored subacute. 2. Mild chronic microvascular ischemic change in the cerebral white matter. 3. ASPECTS is 10. Electronically Signed   By: Marnee Spring M.D.   On: 03/06/2016 12:47    EKG  EKG Interpretation None       Radiology Ct Head Code Stroke W/o Cm  Result Date: 03/06/2016 CLINICAL DATA:  Code stroke.  Left-sided weakness and numbness. EXAM: CT HEAD WITHOUT CONTRAST TECHNIQUE: Contiguous axial images were obtained from the base of the skull through the vertex without intravenous contrast. COMPARISON:  None. FINDINGS: Brain: There is patchy infarct in the inferior left cerebellum, favored subacute. Mild patchy low-density in the bilateral cerebral white matter, likely chronic microvascular ischemia in this patient with multiple vascular risk factors. No acute hemorrhage, hydrocephalus, or mass. Vascular: No hyperdense vessel.  Atherosclerotic calcification. Skull: Hyperostosis.  No acute or aggressive finding. Sinuses/Orbits: Gaze to the left, nonspecific. Other: Critical Value/emergent results were called by telephone at the time of interpretation on 03/06/2016 at 12:45 pm to Dr. Amada Jupiter, who verbally acknowledged these results. ASPECTS [facility] Stroke Program Early CT Score) - Ganglionic level infarction (caudate, lentiform nuclei, internal capsule, insula, M1-M3 cortex): 7 - Supraganglionic infarction (M4-M6 cortex): 3 Total score (0-10 with 10 being normal): 10 IMPRESSION: 1. Moderate area of inferior left cerebellar infarction, favored subacute. 2. Mild chronic microvascular ischemic change in the  cerebral white matter. 3. ASPECTS is 10. Electronically Signed   By: Marnee Spring M.D.   On: 03/06/2016 12:47    Procedures Procedures (including critical care time)  Medications Ordered in ED Medications - No data to display   Initial Impression / Assessment and Plan / ED Course  I have reviewed the triage vital signs and the nursing notes.  Pertinent labs & imaging results that were available during my care of the patient were reviewed by me and considered in my medical decision making (see chart for details).  Reviewed nursing notes and prior charts for additional history.   Patient  was code cva prior to arrival.   Neurology has evaluated in ED.   Pt feels symptoms improved from prior.  Neurology discussing possibility tpa tx with pt.   Neurology to admit.   Final Clinical Impressions(s) / ED Diagnoses   Final diagnoses:  None    New Prescriptions New Prescriptions   No medications on file     Cathren LaineKevin Lamae Fosco, MD 03/06/16 1307

## 2016-03-06 NOTE — ED Notes (Signed)
ED Provider at bedside. 

## 2016-03-06 NOTE — ED Notes (Signed)
Pt rolled; had a BM; cleaned.

## 2016-03-06 NOTE — ED Notes (Signed)
Multiple attempts made to call floor. Advised clerk that bedside report will be given.

## 2016-03-06 NOTE — ED Notes (Signed)
Neuro on phone with son at this time.

## 2016-03-06 NOTE — H&P (Signed)
History and Physical    Dreama Kuna ZOX:[id number] DOB: [birth year] DOA: 03/06/2016  PCP: Sanda Linger, MD  Patient coming from: home  Chief Complaint: left sided weakness, vertigo  HPI: Phallon Haydu is a 66 y.o. female with medical history significant of coronary artery disease, diabetes, hypertension, hyperlipidemia, obesity, obstructive sleep apnea, presents to the emergency room with chief complaint of sudden onset vertigo and left-sided weakness early on the morning of presentation. She states the symptoms were sudden, and presented to the emergency room as a code stroke. Neurology was consulted, and patient underwent a CT scan of the brain which showed moderate area of the inferior left cerebral infarction, however given the appearance of subacute no TPA was administered. Patient had similar symptoms about 2 weeks ago which resolved on their own. She reports no fever or chills, denies any chest pain or shortness of breath. She complains of significant dizziness still, however appreciates her left-sided weakness has started to improve since arrival in the emergency room. She has no abdominal pain, no vomiting however reports nausea along with her dizziness. No recent weight gain or weight loss. Complains of bilateral lower extremity swelling however this has been chronic.    In the emergency room vitals are stable, blood work essentially normal, CT scan as described above. TRH was asked for admission for stroke.   Review of Systems: As per HPI otherwise 10 point review of systems negative.   Past Medical History:  Diagnosis Date  . CAD (coronary artery disease)    small NSTEMI 11/12: LHC 01/06/11:  LAD 90-95%, mD1 90%, pD2 50%,  pOM1 50%, RCA occluded,  EF > 65%;  CABG  12/12: L-LAD  . Diabetes mellitus   . Glucose intolerance (impaired glucose tolerance)    Hemoglobin A1c 6.4 in 12/2010  . HTN (hypertension)    Echo 01/06/11: EF 65%  . Hyperlipidemia   . Obesity     Past  Surgical History:  Procedure Laterality Date  . ABDOMINAL HYSTERECTOMY    . APPENDECTOMY    . CORONARY ARTERY BYPASS GRAFT  01/12/2011   Procedure: CORONARY ARTERY BYPASS GRAFTING (CABG);  Surgeon: Alleen Borne, MD;  Location: [facility] OR;  Service: Open Heart Surgery;  Laterality: N/A;  coronary artery bypass graft times one using left internal mammary artery . Attempted endoscopic saphenous vein harvest  . LEFT HEART CATHETERIZATION WITH CORONARY ANGIOGRAM N/A 01/06/2011   Procedure: LEFT HEART CATHETERIZATION WITH CORONARY ANGIOGRAM;  Surgeon: Rollene Rotunda, MD;  Location: [facility] CATH LAB;  Service: Cardiovascular;  Laterality: N/A;     reports that she has never smoked. She has never used smokeless tobacco. She reports that she drinks alcohol. She reports that she does not use drugs.  Allergies  Allergen Reactions  . Lisinopril     cough  . Metformin And Related Diarrhea    A lot of fluid loss from bowel    Family History  Problem Relation Age of Onset  . Coronary artery disease      No family history    Prior to Admission medications   Medication Sig Start Date End Date Taking? Authorizing Provider  aspirin 325 MG tablet Take 325 mg by mouth daily.     Yes Historical Provider, MD  Calcium Carbonate-Vitamin D (CALCIUM-VITAMIN D) 500-200 MG-UNIT tablet Take 1 tablet by mouth daily.   Yes Historical Provider, MD  glucosamine-chondroitin 500-400 MG tablet Take 1 tablet by mouth daily.     Yes Historical Provider, MD  metoprolol (LOPRESSOR) 50 MG  tablet TAKE ONE TABLET BY MOUTH TWICE DAILY 09/24/15  Yes Lewayne BuntingBrian S Crenshaw, MD  pravastatin (PRAVACHOL) 80 MG tablet TAKE ONE TABLET BY MOUTH ONCE DAILY 09/24/15  Yes Lewayne BuntingBrian S Crenshaw, MD  Cyanocobalamin (VITAMIN B-12 IJ) weekly    Historical Provider, MD  furosemide (LASIX) 20 MG tablet TAKE ONE TABLET BY MOUTH ONCE DAILY Patient not taking: Reported on 03/06/2016 09/23/15   Oretha Milchakesh Alva V, MD  Ginkgo 60 MG TABS Take 1 tablet by mouth daily. For  memory     Historical Provider, MD  Chilton SiGreen Tea, Camillia sinensis, 1000 MG TABS Take 1 tablet by mouth daily.     Historical Provider, MD    Physical Exam: Vitals:   03/06/16 1615 03/06/16 1630 03/06/16 1645 03/06/16 1745  BP: 159/92 (!) 160/136 166/85 161/62  Pulse: 69 65 67 61  Resp: 19 18 17 16   Temp:      TempSrc:      SpO2: 100% 97% 100% 100%  Weight:      Height:          Constitutional: NAD, calm, comfortable Vitals:   03/06/16 1615 03/06/16 1630 03/06/16 1645 03/06/16 1745  BP: 159/92 (!) 160/136 166/85 161/62  Pulse: 69 65 67 61  Resp: 19 18 17 16   Temp:      TempSrc:      SpO2: 100% 97% 100% 100%  Weight:      Height:       Eyes: PERRL, lids and conjunctivae normal ENMT: Mucous membranes are moist.   Neck: normal, supple, no masses Respiratory: clear to auscultation bilaterally, no wheezing, no crackles. Normal respiratory effort.  Cardiovascular: Regular rate and rhythm, no murmurs / rubs / gallops. 1+ extremity edema.  Abdomen: no tenderness, no masses palpated. Bowel sounds positive.  Musculoskeletal: no clubbing / cyanosis. Normal muscle tone.  Skin: no rashes, lesions, ulcers. No induration Neurologic: slight facial droop, sensation intact. Strength 5-/5 LUE, 5/5 in rest Psychiatric: Normal judgment and insight. Alert and oriented x 3. Normal mood.   Labs on Admission: I have personally reviewed following labs and imaging studies  CBC:  Recent Labs Lab 03/06/16 1217  WBC 6.6  NEUTROABS 3.8  HGB 12.9  HCT 40.3  MCV 94.2  PLT 224   Basic Metabolic Panel:  Recent Labs Lab 03/06/16 1217  NA 140  K 3.8  CL 106  CO2 24  GLUCOSE 113*  BUN 21*  CREATININE 0.98  CALCIUM 9.4   GFR: Estimated Creatinine Clearance: 89.4 mL/min (by C-G formula based on SCr of 0.98 mg/dL). Liver Function Tests:  Recent Labs Lab 03/06/16 1217  AST 14*  ALT 15  ALKPHOS 78  BILITOT 0.3  PROT 7.0  ALBUMIN 3.5   No results for input(s): LIPASE, AMYLASE in  the last 168 hours. No results for input(s): AMMONIA in the last 168 hours. Coagulation Profile:  Recent Labs Lab 03/06/16 1217  INR 0.97   Cardiac Enzymes: No results for input(s): CKTOTAL, CKMB, CKMBINDEX, TROPONINI in the last 168 hours. BNP (last 3 results) No results for input(s): PROBNP in the last 8760 hours. HbA1C: No results for input(s): HGBA1C in the last 72 hours. CBG:  Recent Labs Lab 03/06/16 1217  GLUCAP 103*   Lipid Profile: No results for input(s): CHOL, HDL, LDLCALC, TRIG, CHOLHDL, LDLDIRECT in the last 72 hours. Thyroid Function Tests: No results for input(s): TSH, T4TOTAL, FREET4, T3FREE, THYROIDAB in the last 72 hours. Anemia Panel: No results for input(s): VITAMINB12, FOLATE, FERRITIN, TIBC, IRON,  RETICCTPCT in the last 72 hours. Urine analysis:    Component Value Date/Time   COLORURINE Yellow 04/05/2011 1129   APPEARANCEUR CLEAR 04/05/2011 1129   LABSPEC 1.025 04/05/2011 1129   PHURINE 5.5 04/05/2011 1129   GLUCOSEU NEGATIVE 04/05/2011 1129   HGBUR SMALL 04/05/2011 1129   BILIRUBINUR NEGATIVE 04/05/2011 1129   KETONESUR NEGATIVE 04/05/2011 1129   UROBILINOGEN 0.2 04/05/2011 1129   NITRITE NEGATIVE 04/05/2011 1129   LEUKOCYTESUR LARGE 04/05/2011 1129   Sepsis Labs: @LABRCNTIP (procalcitonin:4,lacticidven:4) )No results found for this or any previous visit (from the past 240 hour(s)).   Radiological Exams on Admission: Ct Head Code Stroke W/o Cm  Result Date: 03/06/2016 CLINICAL DATA:  Code stroke.  Left-sided weakness and numbness. EXAM: CT HEAD WITHOUT CONTRAST TECHNIQUE: Contiguous axial images were obtained from the base of the skull through the vertex without intravenous contrast. COMPARISON:  None. FINDINGS: Brain: There is patchy infarct in the inferior left cerebellum, favored subacute. Mild patchy low-density in the bilateral cerebral white matter, likely chronic microvascular ischemia in this patient with multiple vascular risk factors.  No acute hemorrhage, hydrocephalus, or mass. Vascular: No hyperdense vessel.  Atherosclerotic calcification. Skull: Hyperostosis.  No acute or aggressive finding. Sinuses/Orbits: Gaze to the left, nonspecific. Other: Critical Value/emergent results were called by telephone at the time of interpretation on 03/06/2016 at 12:45 pm to Dr. Amada Jupiter, who verbally acknowledged these results. ASPECTS [facility] Stroke Program Early CT Score) - Ganglionic level infarction (caudate, lentiform nuclei, internal capsule, insula, M1-M3 cortex): 7 - Supraganglionic infarction (M4-M6 cortex): 3 Total score (0-10 with 10 being normal): 10 IMPRESSION: 1. Moderate area of inferior left cerebellar infarction, favored subacute. 2. Mild chronic microvascular ischemic change in the cerebral white matter. 3. ASPECTS is 10. Electronically Signed   By: Marnee Spring M.D.   On: 03/06/2016 12:47    EKG: Independently reviewed. Sinus rhythm  Assessment/Plan Active Problems:   CAD (coronary artery disease)   Hypertension   Diabetes mellitus (HCC)   Hyperlipidemia   OSA (obstructive sleep apnea)   Morbid obesity (HCC)   CVA (cerebral vascular accident) [facility])   Stroke  - admit to telemetry, neurology consulted, appreciate input. We'll complete the stroke workup with an MRI of the brain, CT angiogram head and neck. Patient failed swallow evaluation at bedside, will obtain formal speech consult in the morning PT/OT eval. She is active at baseline and still works  - Check lipid panel, hemoglobin A1c  Hypertension  - allow permissive hypertension, hold home medications, she is nothing by mouth   Diabetes mellitus  - doesn't appear to be on medications, may be diet controlled, Lasix and sliding scale and check hemoglobin A1c  Hyperlipidemia  - continue statin  Obstructive sleep apnea  - CPAP daily at bedtime  CAD - no chest pain, on aspirin. Update 2D echo as above   DVT prophylaxis: Lovenox  Code Status: Full  code  Family Communication: husband bedside Disposition Plan: admit to telemetry Consults called: neurology   Admission status: inpatient.     Pamella Pert, MD [facility] Pager [phone]  If 7PM-7AM, please contact night-coverage [email or web address] Password [facility]  03/06/2016, 5:49 PM

## 2016-03-06 NOTE — ED Triage Notes (Signed)
Pt reports was at work and started feeling poorly. Nauseated and vomiting,"feels drunk" presented with left sided weakness and numbness to left face. Pt arrived in CT and had episode of vomiting. 4mg  zofran given. Neurologist in CT. Symptoms beginning to get better when arrived in ED room.

## 2016-03-07 ENCOUNTER — Inpatient Hospital Stay (HOSPITAL_COMMUNITY): Payer: Medicare Other

## 2016-03-07 ENCOUNTER — Encounter (HOSPITAL_COMMUNITY): Payer: Self-pay | Admitting: *Deleted

## 2016-03-07 ENCOUNTER — Other Ambulatory Visit (HOSPITAL_COMMUNITY): Payer: Medicare Other

## 2016-03-07 DIAGNOSIS — N3000 Acute cystitis without hematuria: Secondary | ICD-10-CM

## 2016-03-07 LAB — URINALYSIS, ROUTINE W REFLEX MICROSCOPIC
BILIRUBIN URINE: NEGATIVE
Glucose, UA: NEGATIVE mg/dL
Ketones, ur: NEGATIVE mg/dL
Nitrite: POSITIVE — AB
PH: 7 (ref 5.0–8.0)
Protein, ur: NEGATIVE mg/dL
SPECIFIC GRAVITY, URINE: 1.015 (ref 1.005–1.030)

## 2016-03-07 LAB — GLUCOSE, CAPILLARY
GLUCOSE-CAPILLARY: 108 mg/dL — AB (ref 65–99)
GLUCOSE-CAPILLARY: 154 mg/dL — AB (ref 65–99)
GLUCOSE-CAPILLARY: 168 mg/dL — AB (ref 65–99)
Glucose-Capillary: 101 mg/dL — ABNORMAL HIGH (ref 65–99)
Glucose-Capillary: 105 mg/dL — ABNORMAL HIGH (ref 65–99)

## 2016-03-07 LAB — BASIC METABOLIC PANEL
ANION GAP: 10 (ref 5–15)
BUN: 16 mg/dL (ref 6–20)
CALCIUM: 9.2 mg/dL (ref 8.9–10.3)
CO2: 25 mmol/L (ref 22–32)
Chloride: 107 mmol/L (ref 101–111)
Creatinine, Ser: 0.84 mg/dL (ref 0.44–1.00)
Glucose, Bld: 130 mg/dL — ABNORMAL HIGH (ref 65–99)
Potassium: 3.9 mmol/L (ref 3.5–5.1)
SODIUM: 142 mmol/L (ref 135–145)

## 2016-03-07 LAB — POCT I-STAT, CHEM 8
BUN: 22 mg/dL — AB (ref 6–20)
CREATININE: 1 mg/dL (ref 0.44–1.00)
Calcium, Ion: 1.17 mmol/L (ref 1.15–1.40)
Chloride: 106 mmol/L (ref 101–111)
GLUCOSE: 116 mg/dL — AB (ref 65–99)
HCT: 40 % (ref 36.0–46.0)
Hemoglobin: 13.6 g/dL (ref 12.0–15.0)
POTASSIUM: 3.7 mmol/L (ref 3.5–5.1)
Sodium: 144 mmol/L (ref 135–145)
TCO2: 25 mmol/L (ref 0–100)

## 2016-03-07 LAB — CBC
HCT: 39.5 % (ref 36.0–46.0)
Hemoglobin: 12.5 g/dL (ref 12.0–15.0)
MCH: 29.6 pg (ref 26.0–34.0)
MCHC: 31.6 g/dL (ref 30.0–36.0)
MCV: 93.4 fL (ref 78.0–100.0)
PLATELETS: 218 10*3/uL (ref 150–400)
RBC: 4.23 MIL/uL (ref 3.87–5.11)
RDW: 13.8 % (ref 11.5–15.5)
WBC: 9.3 10*3/uL (ref 4.0–10.5)

## 2016-03-07 LAB — LIPID PANEL
CHOLESTEROL: 196 mg/dL (ref 0–200)
HDL: 47 mg/dL (ref >40)
LDL Cholesterol: 137 mg/dL — ABNORMAL HIGH (ref 0–99)
TRIGLYCERIDES: 58 mg/dL (ref <150)
Total CHOL/HDL Ratio: 4.2 RATIO
VLDL: 12 mg/dL (ref 0–40)

## 2016-03-07 LAB — POCT I-STAT TROPONIN I: TROPONIN I, POC: 0 ng/mL (ref 0.00–0.08)

## 2016-03-07 LAB — RAPID URINE DRUG SCREEN, HOSP PERFORMED
Amphetamines: NOT DETECTED
BARBITURATES: NOT DETECTED
Benzodiazepines: NOT DETECTED
Cocaine: NOT DETECTED
Opiates: NOT DETECTED
TETRAHYDROCANNABINOL: NOT DETECTED

## 2016-03-07 MED ORDER — CLOPIDOGREL BISULFATE 75 MG PO TABS
75.0000 mg | ORAL_TABLET | Freq: Every day | ORAL | Status: DC
  Administered 2016-03-07 – 2016-03-10 (×4): 75 mg via ORAL
  Filled 2016-03-07 (×4): qty 1

## 2016-03-07 MED ORDER — PROMETHAZINE HCL 25 MG/ML IJ SOLN
12.5000 mg | Freq: Once | INTRAMUSCULAR | Status: AC
  Administered 2016-03-07: 12.5 mg via INTRAVENOUS
  Filled 2016-03-07: qty 1

## 2016-03-07 MED ORDER — ASPIRIN EC 325 MG PO TBEC
325.0000 mg | DELAYED_RELEASE_TABLET | Freq: Every day | ORAL | Status: DC
  Administered 2016-03-07 – 2016-03-10 (×4): 325 mg via ORAL
  Filled 2016-03-07 (×4): qty 1

## 2016-03-07 MED ORDER — ATORVASTATIN CALCIUM 80 MG PO TABS
80.0000 mg | ORAL_TABLET | Freq: Every day | ORAL | Status: DC
  Administered 2016-03-07 – 2016-03-09 (×3): 80 mg via ORAL
  Filled 2016-03-07 (×3): qty 1

## 2016-03-07 MED ORDER — IOPAMIDOL (ISOVUE-370) INJECTION 76%
INTRAVENOUS | Status: AC
  Administered 2016-03-07: 50 mL
  Filled 2016-03-07: qty 50

## 2016-03-07 NOTE — Evaluation (Signed)
Physical Therapy Evaluation Patient Details Name: Megan Atkinson MRN: [id number] DOB: [birth year] Today's Date: 03/07/2016   History of Present Illness  Pt is a 66 y.o. female who presents to ED with sudden vertigo and L sided weakness. CT revealed moderate area of inferior L cerebral infarction favored subacute and MRI revealed L PICA occlusion with patchy acute infarcts affecting L PICA territory of the cerebellum and L lateral and central medulla. PMH: coronary artery disease, diabetes, hypertension, hyperlipidemia, obesity, and obstructive sleep apnea.  Clinical Impression  Patient presents with decreased independence with mobility due to deficits listed in PT problem list.  Currently at max +2 A for safety with mobility, but able to move OOB with use of stedy.  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  Feel continued skilled PT in the acute setting needed to address issues and allow return home following CIR rehab stay.     Follow Up Recommendations CIR    Equipment Recommendations  Other (comment) (tba)    Recommendations for Other Services Rehab consult     Precautions / Restrictions Precautions Precautions: Fall Precaution Comments: L pusher, L weakness Restrictions Weight Bearing Restrictions: No      Mobility  Bed Mobility Overal bed mobility: Needs Assistance Bed Mobility: Supine to Sit     Supine to sit: Mod assist     General bed mobility comments: assisted by OT prior to my arrival  Transfers Overall transfer level: Needs assistance Equipment used: Rolling walker (2 wheeled) Transfers: Sit to/from Stand Sit to Stand: Max assist;+2 physical assistance         General transfer comment: sit to stand with +2 A needed to weight shift her to R to achieve upright standing, with stedy able to stand easier still leaning L  Ambulation/Gait             General Gait Details: not tested, not safe  Stairs            Wheelchair Mobility     Modified Rankin (Stroke Patients Only) Modified Rankin (Stroke Patients Only) Pre-Morbid Rankin Score: No symptoms Modified Rankin: Severe disability     Balance Overall balance assessment: Needs assistance Sitting-balance support: Bilateral upper extremity supported;Feet supported Sitting balance-Leahy Scale: Poor Sitting balance - Comments: sate EOB about 15 minutes with mod support to prevent falling to L; mod cues and assist for leaning on R elbow   Standing balance support: Bilateral upper extremity supported;During functional activity Standing balance-Leahy Scale: Poor Standing balance comment: max A of 2 in standing to weight shift R with walker; stood about 30 seconds                             Pertinent Vitals/Pain Pain Assessment: No/denies pain    Home Living Family/patient expects to be discharged to:: Inpatient rehab Living Arrangements: Alone Available Help at Discharge: Family;Available 24 hours/day;Available PRN/intermittently;Friend(s) (reports a retired friend down the street can stay. son works) [facility] Home Access: Stairs to enter   Entergy CorporationEntrance Stairs-Number of Steps: 6 (but reports level entry at front Home Layout: Two level;1/2 bath on main level (plans to stay on main level) Home Equipment: Walker - 2 wheels      Prior Function Level of Independence: Independent         Comments: Has walker but does not use this. 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     Hand Dominance  Dominant Hand: Right    Extremity/Trunk Assessment   Upper Extremity Assessment Upper Extremity Assessment: Defer to OT evaluation LUE Deficits / Details: Grossly weak as compared to R UE. AROM WFL. Noted decreased proprioception during movements. LUE Sensation: decreased proprioception LUE Coordination: decreased gross motor    Lower Extremity Assessment Lower Extremity Assessment: LLE deficits/detail LLE Deficits / Details:  weakness, unable to move off bed independently; limited supporting in stance       Communication   Communication: No difficulties  Cognition Arousal/Alertness: Awake/alert Behavior During Therapy: WFL for tasks assessed/performed Overall Cognitive Status: Within Functional Limits for tasks assessed                 General Comments: Cognition in tact for tasks assessed. Pt able to problem solve simple ADL tasks. Will continue to assess.    General Comments      Exercises     Assessment/Plan    PT Assessment Patient needs continued PT services  PT Problem List Decreased strength;Decreased balance;Decreased knowledge of use of DME;Impaired sensation;Decreased coordination;Decreased mobility;Decreased safety awareness;Decreased knowledge of precautions;Decreased activity tolerance          PT Treatment Interventions DME instruction;Gait training;Balance training;Functional mobility training;Neuromuscular re-education;Therapeutic exercise;Patient/family education;Therapeutic activities    PT Goals (Current goals can be found in the Care Plan section)  Acute Rehab PT Goals Patient Stated Goal: get back to normal PT Goal Formulation: With patient Time For Goal Achievement: 03/21/16 Potential to Achieve Goals: Good    Frequency Min 4X/week   Barriers to discharge        Co-evaluation PT/OT/SLP Co-Evaluation/Treatment: Yes Reason for Co-Treatment: Complexity of the patient's impairments (multi-system involvement);For patient/therapist safety   OT goals addressed during session: ADL's and self-care       End of Session Equipment Utilized During Treatment: Gait belt Activity Tolerance: Patient tolerated treatment well Patient left: in chair;with call bell/phone within reach;with chair alarm set Nurse Communication: Mobility status;Need for lift equipment         Time: 8119-1478 PT Time Calculation (min) (ACUTE ONLY): 26 min   Charges:   PT Evaluation $PT  Eval Moderate Complexity: 1 Procedure     PT G CodesElray Atkinson 03/31/16, 6:19 PM  Megan Atkinson, PT [phone] 03/31/16

## 2016-03-07 NOTE — Evaluation (Signed)
Occupational Therapy Evaluation Patient Details Name: Megan Atkinson MRN: [id number] DOB: Jan 14, 1951 Today's Date: 03/07/2016    History of Present Illness Pt is a 65 y.o. female who presents to ED with sudden vertigo and L sided weakness. CT revealed moderate area of inferior L cerebral infarction favored subacute and MRI revealed L PICA occlusion with patchy acute infarcts affecting L PICA territory of the cerebellum and L lateral and central medulla. PMH: coronary artery disease, diabetes, hypertension, hyperlipidemia, obesity, and obstructive sleep apnea.   Clinical Impression   PTA, pt reports independence with ADL and functional mobility and was working full-time. Pt currently requires max assist +2 with Stedy for toilet transfers, max assist with LB ADL, and mod assist with UB ADL. She remains dizzy with all movement but is motivated to participate in therapy session. Pt additionally presents with poor eye alignment, blurry and decreased vision in L eye, and "jumping" with lateral tracking greater in L eye but present bilaterally. Visual deficits as well as decreased L sided weakness significantly impacting ability to participate in ADL. Pt would benefit from continued OT services while admitted to improve independence with ADL and functional mobility. Pt is a good candidate for CIR placement for continued rehabilitation to maximize return to PLOF. Will continue to follow acutely.    Follow Up Recommendations  CIR;Supervision/Assistance - 24 hour    Equipment Recommendations  Other (comment) (TBD at next venue of care)    Recommendations for Other Services Rehab consult     Precautions / Restrictions Precautions Precautions: Fall Restrictions Weight Bearing Restrictions: No      Mobility Bed Mobility Overal bed mobility: Needs Assistance Bed Mobility: Supine to Sit     Supine to sit: Mod assist     General bed mobility comments: Assist mainly to move L LE out of bed but  also to raise trunk.  Transfers Overall transfer level: Needs assistance Equipment used: Rolling walker (2 wheeled) Transfers: Sit to/from Stand Sit to Stand: Max assist;+2 physical assistance         General transfer comment: Attempted sit<>stand with max assist +2 with RW and unsuccessful x2. Pt able to complete sit<>stand with Stedy with max assist +2.     Balance Overall balance assessment: Needs assistance Sitting-balance support: Bilateral upper extremity supported;Feet supported Sitting balance-Leahy Scale: Poor Sitting balance - Comments: Only able to maintain seated balance momentarily without support due to L lateral lean.   Standing balance support: Bilateral upper extremity supported;During functional activity Standing balance-Leahy Scale: Poor Standing balance comment: Required assistance to avoid L lateral lean.                            ADL Overall ADL's : Needs assistance/impaired     Grooming: Sitting;Minimal assistance   Upper Body Bathing: Moderate assistance;Sitting Upper Body Bathing Details (indicate cue type and reason): Up to mod assist to maintain balance in sitting. Lower Body Bathing: Maximal assistance;Sitting/lateral leans   Upper Body Dressing : Moderate assistance;Sitting   Lower Body Dressing: Maximal assistance;Sitting/lateral leans   Toilet Transfer: +2 for physical assistance;Maximal assistance;BSC Toilet Transfer Details (indicate cue type and reason): Simulated using Stedy. Attempted sit<>stand with max assist +2 with RW and unable to complete Toileting- Clothing Manipulation and Hygiene: Total assistance;Sit to/from stand         General ADL Comments: Pt able to complete simulated toilet transfer wtih Stedy and max assist of 2. Educated pt and son on ADL strategies  during recovery.     Vision Vision Assessment?: Yes;Vision impaired- to be further tested in functional context Eye Alignment: Impaired (comment) (L eye  resting laterally) Ocular Range of Motion: Impaired-to be further tested in functional context Alignment/Gaze Preference: Head turned (Turns head to focus on items.) Tracking/Visual Pursuits: Decreased smoothness of horizontal tracking;Decreased smoothness of vertical tracking (Nystagmus noted bilaterally; greater in L eye with lateral) Saccades: Impaired - to be further tested in functional context Convergence: Impaired - to be further tested in functional context Visual Fields: Impaired-to be further tested in functional context (Pt unable to complete test without moving eyes.) Additional Comments: Will continue to assess. Pt able to read portions of words when both eyes open. Improves with R eye. May demonstrate field cut to the R but pt with difficulty during testing.   Perception     Praxis      Pertinent Vitals/Pain Pain Assessment: No/denies pain     Hand Dominance Right   Extremity/Trunk Assessment Upper Extremity Assessment Upper Extremity Assessment: LUE deficits/detail LUE Deficits / Details: Grossly weak as compared to R UE. AROM WFL. Noted decreased proprioception during movements. LUE Sensation: decreased proprioception LUE Coordination: decreased gross motor   Lower Extremity Assessment Lower Extremity Assessment: Defer to PT evaluation       Communication Communication Communication: No difficulties   Cognition Arousal/Alertness: Awake/alert Behavior During Therapy: WFL for tasks assessed/performed Overall Cognitive Status: Within Functional Limits for tasks assessed                 General Comments: Cognition in tact for tasks assessed. Pt able to problem solve simple ADL tasks. Will continue to assess.   General Comments       Exercises       Shoulder Instructions      Home Living Family/patient expects to be discharged to:: Private residence Living Arrangements: Alone Available Help at Discharge: Family;Available 24 hours/day;Available  PRN/intermittently;Friend(s) (Pt reports that she will be able to arrange assistance) Type of Home: House Home Access: Stairs to enter Entrance Stairs-Number of Steps: 6   Home Layout: Two level (May be able to stay on first level)     Bathroom Shower/Tub: Chief Strategy Officer: Standard     Home Equipment: Environmental consultant - 2 wheels          Prior Functioning/Environment Level of Independence: Independent        Comments: Has walker but does not use this. 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        OT Problem List: Decreased strength;Decreased range of motion;Decreased activity tolerance;Impaired balance (sitting and/or standing);Impaired vision/perception;Decreased safety awareness;Decreased knowledge of use of DME or AE;Decreased knowledge of precautions;Pain;Impaired UE functional use   OT Treatment/Interventions: Self-care/ADL training;Neuromuscular education;Therapeutic exercise;Energy conservation;DME and/or AE instruction;Therapeutic activities;Visual/perceptual remediation/compensation;Patient/family education;Balance training;Cognitive remediation/compensation    OT Goals(Current goals can be found in the care plan section) Acute Rehab OT Goals Patient Stated Goal: get back to normal OT Goal Formulation: With patient Time For Goal Achievement: 03/21/16 Potential to Achieve Goals: Good ADL Goals Pt Will Perform Grooming: with min guard assist;standing Pt Will Perform Upper Body Dressing: with supervision;sitting Pt Will Perform Lower Body Dressing: with min guard assist;sit to/from stand Pt Will Transfer to Toilet: with min assist;ambulating;bedside commode (BSC over toilet) Pt/caregiver will Perform Home Exercise Program: Increased strength;Left upper extremity;With written HEP provided;With Supervision Additional ADL Goal #1: Pt will complete bed mobility at a modified independent level in preparation for ADL.  OT Frequency: Min 3X/week    Barriers to D/C:            Co-evaluation PT/OT/SLP Co-Evaluation/Treatment: Yes Reason for Co-Treatment: Complexity of the patient's impairments (multi-system involvement);For patient/therapist safety   OT goals addressed during session: ADL's and self-care      End of Session Equipment Utilized During Treatment: Gait belt;Rolling walker Antony Salmon(Stedy) Nurse Communication: Need for lift equipment;Mobility status  Activity Tolerance: Patient tolerated treatment well Patient left: in chair;with call bell/phone within reach;with chair alarm set   Time: [id number] OT Time Calculation (min): 40 min Charges:  OT General Charges $OT Visit: 1 Procedure OT Evaluation $OT Eval Moderate Complexity: 1 Procedure G-CodesDoristine Section:    Caelen Reierson A Tyshia Fenter, OTR/L [phone] 03/07/2016, 6:05 PM

## 2016-03-07 NOTE — Progress Notes (Signed)
PROGRESS NOTE  Megan Atkinson QMV:[id number] DOB: Oct 03, 1950 DOA: 03/06/2016 PCP: Sanda Lingerhomas Jones, MD   LOS: 1 day   Brief Narrative: Megan Atkinson is a 66 y.o. female with medical history significant of coronary artery disease, diabetes, hypertension, hyperlipidemia, obesity, obstructive sleep apnea, presents to the emergency room with chief complaint of sudden onset vertigo and left-sided weakness early on the morning of presentation  Assessment & Plan: Active Problems:   CAD (coronary artery disease)   Hypertension   Diabetes mellitus (HCC)   Hyperlipidemia   OSA (obstructive sleep apnea)   Morbid obesity (HCC)   CVA (cerebral vascular accident) (HCC)   Acute CVA - Patient underwent an MRI which showed patchy acute infarcts in the left PICA territory of the cerebellum, as well as left lateral and central medulla. She also has left PICA occlusion.  - Neurology consulted, appreciate input -  complete workup with 2-D echo, lipid panel, hemoglobin A1c, PT/OT/SLP.  - She failed swallow evaluation at bedside in the emergency room  Hypertension  - allow permissive hypertension, hold home medications, she is nothing by mouth   Diabetes mellitus  - doesn't appear to be on medications, may be diet controlled, Lasix and sliding scale and check hemoglobin A1c  Hyperlipidemia  - continue statin  Obstructive sleep apnea  - CPAP daily at bedtime  CAD - no chest pain, on aspirin. Update 2D echo as above   DVT prophylaxis: Lovenox Code Status: Full code Family Communication: no family at bedside Disposition Plan: TBD  Consultants:   Neurology   Procedures:   2D echo: pending  Antimicrobials:  None    Subjective: - no chest pain, shortness of breath, no abdominal pain, nausea or vomiting. Appreciates less dizziness this morning. Her left sided weakness has almost resolved.   Objective: Vitals:   03/07/16 0200 03/07/16 0400 03/07/16 0548 03/07/16 1009  BP: (!)  158/59 (!) 157/57 (!) 148/75 (!) 148/53  Pulse: 69 (!) 54 65 86  Resp: 20 20 20 20   Temp: 97.8 F (36.6 C) 97.8 F (36.6 C) 98.1 F (36.7 C) 98 F (36.7 C)  TempSrc: Axillary Axillary Oral Oral  SpO2: 100% 99% 99% 98%  Weight:      Height:       No intake or output data in the 24 hours ending 03/07/16 1142 Filed Weights   03/06/16 1200  Weight: (!) 161.8 kg (356 lb 11.3 oz)    Examination: Constitutional: NAD Vitals:   03/07/16 0200 03/07/16 0400 03/07/16 0548 03/07/16 1009  BP: (!) 158/59 (!) 157/57 (!) 148/75 (!) 148/53  Pulse: 69 (!) 54 65 86  Resp: 20 20 20 20   Temp: 97.8 F (36.6 C) 97.8 F (36.6 C) 98.1 F (36.7 C) 98 F (36.7 C)  TempSrc: Axillary Axillary Oral Oral  SpO2: 100% 99% 99% 98%  Weight:      Height:       Eyes: PERRL, lids and conjunctivae normal Respiratory: clear to auscultation bilaterally, no wheezing, no crackles.  Cardiovascular: Regular rate and rhythm, no murmurs / rubs / gallops. No LE edema. 2+ pedal pulses.  Abdomen: no tenderness. Bowel sounds positive.  Musculoskeletal: no clubbing / cyanosis.  Neurologic: slight facial droop, sensation intact. Strength 5-/5 LUE, 5/5 in rest Psychiatric: Normal judgment and insight. Alert and oriented x 3. Normal mood.    Data Reviewed: I have personally reviewed following labs and imaging studies  CBC:  Recent Labs Lab 03/06/16 1217 03/06/16 1223 03/07/16 0307  WBC 6.6  --  9.3  NEUTROABS 3.8  --   --   HGB 12.9 13.6 12.5  HCT 40.3 40.0 39.5  MCV 94.2  --  93.4  PLT 224  --  218   Basic Metabolic Panel:  Recent Labs Lab 03/06/16 1217 03/06/16 1223 03/07/16 0307  NA 140 144 142  K 3.8 3.7 3.9  CL 106 106 107  CO2 24  --  25  GLUCOSE 113* 116* 130*  BUN 21* 22* 16  CREATININE 0.98 1.00 0.84  CALCIUM 9.4  --  9.2   GFR: Estimated Creatinine Clearance: 104.3 mL/min (by C-G formula based on SCr of 0.84 mg/dL). Liver Function Tests:  Recent Labs Lab 03/06/16 1217  AST 14*    ALT 15  ALKPHOS 78  BILITOT 0.3  PROT 7.0  ALBUMIN 3.5   No results for input(s): LIPASE, AMYLASE in the last 168 hours. No results for input(s): AMMONIA in the last 168 hours. Coagulation Profile:  Recent Labs Lab 03/06/16 1217  INR 0.97   Cardiac Enzymes: No results for input(s): CKTOTAL, CKMB, CKMBINDEX, TROPONINI in the last 168 hours. BNP (last 3 results) No results for input(s): PROBNP in the last 8760 hours. HbA1C: No results for input(s): HGBA1C in the last 72 hours. CBG:  Recent Labs Lab 03/06/16 1217 03/06/16 2308 03/07/16 0736 03/07/16 1117  GLUCAP 103* 129* 108* 101*   Lipid Profile:  Recent Labs  03/07/16 0307  CHOL 196  HDL 47  LDLCALC 137*  TRIG 58  CHOLHDL 4.2   Thyroid Function Tests: No results for input(s): TSH, T4TOTAL, FREET4, T3FREE, THYROIDAB in the last 72 hours. Anemia Panel: No results for input(s): VITAMINB12, FOLATE, FERRITIN, TIBC, IRON, RETICCTPCT in the last 72 hours. Urine analysis:    Component Value Date/Time   COLORURINE Yellow 04/05/2011 1129   APPEARANCEUR CLEAR 04/05/2011 1129   LABSPEC 1.025 04/05/2011 1129   PHURINE 5.5 04/05/2011 1129   GLUCOSEU NEGATIVE 04/05/2011 1129   HGBUR SMALL 04/05/2011 1129   BILIRUBINUR NEGATIVE 04/05/2011 1129   KETONESUR NEGATIVE 04/05/2011 1129   UROBILINOGEN 0.2 04/05/2011 1129   NITRITE NEGATIVE 04/05/2011 1129   LEUKOCYTESUR LARGE 04/05/2011 1129   Sepsis Labs: Invalid input(s): PROCALCITONIN, LACTICIDVEN  No results found for this or any previous visit (from the past 240 hour(s)).    Radiology Studies: Ct Angio Head W/cm &/or Wo Cm  Result Date: 03/07/2016 CLINICAL DATA:  Follow-up stroke. History of hypertension, hyperlipidemia and diabetes. EXAM: CT ANGIOGRAPHY HEAD AND NECK TECHNIQUE: Multidetector CT imaging of the head and neck was performed using the standard protocol during bolus administration of intravenous contrast. Multiplanar CT image reconstructions and MIPs  were obtained to evaluate the vascular anatomy. Carotid stenosis measurements (when applicable) are obtained utilizing NASCET criteria, using the distal internal carotid diameter as the denominator. CONTRAST:  50 cc Isovue 370 COMPARISON:  CT HEAD March 06, 2016 FINDINGS: CTA NECK AORTIC ARCH: Normal appearance of the thoracic arch, normal branch pattern. Mild calcific atherosclerosis of aortic arch. The origins of the innominate, left Common carotid artery and subclavian artery are widely patent. RIGHT CAROTID SYSTEM: Common carotid artery is widely patent, coursing in a straight line fashion. Normal appearance of the carotid bifurcation without hemodynamically significant stenosis by NASCET criteria. Normal appearance of the included internal carotid artery. LEFT CAROTID SYSTEM: Common carotid artery is widely patent, coursing in a straight line fashion. Normal appearance of the carotid bifurcation without hemodynamically significant stenosis by NASCET criteria. Normal appearance of the included internal carotid artery. VERTEBRAL  ARTERIES:Left vertebral artery is dominant. Normal appearance of the vertebral arteries, which appear widely patent. SKELETON: No acute osseous process though bone windows have not been submitted. Multiple bilateral subcentimeter retro pectoral lymph nodes. Patient is edentulous. Moderate degenerative change of the cervical spine. OTHER NECK: Soft tissues of the neck are non-acute though, not tailored for evaluation. Status post median sternotomy. CTA HEAD ANTERIOR CIRCULATION: Normal appearance of the cervical internal carotid arteries, petrous, cavernous and supra clinoid internal carotid arteries. Widely patent anterior communicating artery. Normal appearance of the anterior and middle cerebral arteries. No large vessel occlusion, hemodynamically significant stenosis, dissection, luminal irregularity, contrast extravasation or aneurysm. POSTERIOR CIRCULATION: Moderate focal stenosis  LEFT V4 segment due to eccentric calcific atherosclerosis. Normal appearance of the vertebrobasilar junction and basilar artery, as well as main branch vessels with exception of LEFT posterior-inferior cerebellar artery. Normal appearance of the posterior cerebral arteries, mild stenosis RIGHT P1 origin. Small RIGHT posterior communicating artery present. No large vessel occlusion, hemodynamically significant stenosis, dissection, luminal irregularity, contrast extravasation or aneurysm. VENOUS SINUSES: Major dural venous sinuses are patent though not tailored for evaluation on this angiographic examination. ANATOMIC VARIANTS: None. DELAYED PHASE: Not performed. IMPRESSION: CTA NECK: No hemodynamically significant stenosis or acute vascular process in the neck. CTA HEAD: Poor visualization of LEFT posterior-inferior cerebellar artery, possibly occluded. Moderate focal stenosis LEFT vertebral artery. No emergent large vessel occlusion or severe stenosis. Electronically Signed   By: Awilda Metro M.D.   On: 03/07/2016 01:35   Ct Angio Neck W/cm &/or Wo/cm  Result Date: 03/07/2016 CLINICAL DATA:  Follow-up stroke. History of hypertension, hyperlipidemia and diabetes. EXAM: CT ANGIOGRAPHY HEAD AND NECK TECHNIQUE: Multidetector CT imaging of the head and neck was performed using the standard protocol during bolus administration of intravenous contrast. Multiplanar CT image reconstructions and MIPs were obtained to evaluate the vascular anatomy. Carotid stenosis measurements (when applicable) are obtained utilizing NASCET criteria, using the distal internal carotid diameter as the denominator. CONTRAST:  50 cc Isovue 370 COMPARISON:  CT HEAD March 06, 2016 FINDINGS: CTA NECK AORTIC ARCH: Normal appearance of the thoracic arch, normal branch pattern. Mild calcific atherosclerosis of aortic arch. The origins of the innominate, left Common carotid artery and subclavian artery are widely patent. RIGHT CAROTID  SYSTEM: Common carotid artery is widely patent, coursing in a straight line fashion. Normal appearance of the carotid bifurcation without hemodynamically significant stenosis by NASCET criteria. Normal appearance of the included internal carotid artery. LEFT CAROTID SYSTEM: Common carotid artery is widely patent, coursing in a straight line fashion. Normal appearance of the carotid bifurcation without hemodynamically significant stenosis by NASCET criteria. Normal appearance of the included internal carotid artery. VERTEBRAL ARTERIES:Left vertebral artery is dominant. Normal appearance of the vertebral arteries, which appear widely patent. SKELETON: No acute osseous process though bone windows have not been submitted. Multiple bilateral subcentimeter retro pectoral lymph nodes. Patient is edentulous. Moderate degenerative change of the cervical spine. OTHER NECK: Soft tissues of the neck are non-acute though, not tailored for evaluation. Status post median sternotomy. CTA HEAD ANTERIOR CIRCULATION: Normal appearance of the cervical internal carotid arteries, petrous, cavernous and supra clinoid internal carotid arteries. Widely patent anterior communicating artery. Normal appearance of the anterior and middle cerebral arteries. No large vessel occlusion, hemodynamically significant stenosis, dissection, luminal irregularity, contrast extravasation or aneurysm. POSTERIOR CIRCULATION: Moderate focal stenosis LEFT V4 segment due to eccentric calcific atherosclerosis. Normal appearance of the vertebrobasilar junction and basilar artery, as well as main branch vessels with exception of  LEFT posterior-inferior cerebellar artery. Normal appearance of the posterior cerebral arteries, mild stenosis RIGHT P1 origin. Small RIGHT posterior communicating artery present. No large vessel occlusion, hemodynamically significant stenosis, dissection, luminal irregularity, contrast extravasation or aneurysm. VENOUS SINUSES: Major  dural venous sinuses are patent though not tailored for evaluation on this angiographic examination. ANATOMIC VARIANTS: None. DELAYED PHASE: Not performed. IMPRESSION: CTA NECK: No hemodynamically significant stenosis or acute vascular process in the neck. CTA HEAD: Poor visualization of LEFT posterior-inferior cerebellar artery, possibly occluded. Moderate focal stenosis LEFT vertebral artery. No emergent large vessel occlusion or severe stenosis. Electronically Signed   By: Awilda Metro M.D.   On: 03/07/2016 01:35   Mr Brain Wo Contrast  Result Date: 03/07/2016 CLINICAL DATA:  66 year old female code stroke, left side weakness and numbness. Left cerebellar infarct on presentation head CT 03/06/2016. Suspicion of left PICA occlusion on CTA head 0135 hours today. Initial encounter. EXAM: MRI HEAD WITHOUT CONTRAST TECHNIQUE: Multiplanar, multiecho pulse sequences of the brain and surrounding structures were obtained without intravenous contrast. COMPARISON:  CTA head and neck 0135 hours today. Head CT without contrast 03/06/2016 FINDINGS: Brain: Patchy and confluent restricted diffusion in the inferior left cerebellum and also affecting the left lateral and central medulla (series 3, image 11). Associated T2 and FLAIR hyperintensity. No associated hemorrhage. No significant mass effect. No other restricted diffusion. No midline shift, mass effect, evidence of mass lesion, ventriculomegaly, extra-axial collection or acute intracranial hemorrhage. Cervicomedullary junction and pituitary are within normal limits. Dural calcifications, especially along the superior interhemispheric fissure. Mild for age nonspecific periventricular white matter T2 and FLAIR hyperintensity. No cortical encephalomalacia. No definite chronic cerebral blood products. Cerebral volume is within normal limits for age. Vascular: Major intracranial vascular flow voids are preserved. Suspected thrombus in the left PICA as seen on series 9,  image 6 - the same segment which was nonvisualized by CTA. Skull and upper cervical spine: Negative. Normal bone marrow signal. Sinuses/Orbits: Left gaze deviation. Otherwise negative orbit soft tissues. Visualized paranasal sinuses and mastoids are stable and well pneumatized. Other: Visible internal auditory structures appear normal. Negative scalp soft tissues. IMPRESSION: 1. Left PICA occlusion with patchy acute infarcts affecting the left PICA territory of the cerebellum and also the left lateral and central medulla. No associated hemorrhage or mass effect. 2. No other acute intracranial abnormality and otherwise mild for age nonspecific cerebral white matter signal changes. Electronically Signed   By: Odessa Fleming M.D.   On: 03/07/2016 07:15   Ct Head Code Stroke W/o Cm  Result Date: 03/06/2016 CLINICAL DATA:  Code stroke.  Left-sided weakness and numbness. EXAM: CT HEAD WITHOUT CONTRAST TECHNIQUE: Contiguous axial images were obtained from the base of the skull through the vertex without intravenous contrast. COMPARISON:  None. FINDINGS: Brain: There is patchy infarct in the inferior left cerebellum, favored subacute. Mild patchy low-density in the bilateral cerebral white matter, likely chronic microvascular ischemia in this patient with multiple vascular risk factors. No acute hemorrhage, hydrocephalus, or mass. Vascular: No hyperdense vessel.  Atherosclerotic calcification. Skull: Hyperostosis.  No acute or aggressive finding. Sinuses/Orbits: Gaze to the left, nonspecific. Other: Critical Value/emergent results were called by telephone at the time of interpretation on 03/06/2016 at 12:45 pm to Dr. Amada Jupiter, who verbally acknowledged these results. ASPECTS [facility] Stroke Program Early CT Score) - Ganglionic level infarction (caudate, lentiform nuclei, internal capsule, insula, M1-M3 cortex): 7 - Supraganglionic infarction (M4-M6 cortex): 3 Total score (0-10 with 10 being normal): 10 IMPRESSION: 1.  Moderate area  of inferior left cerebellar infarction, favored subacute. 2. Mild chronic microvascular ischemic change in the cerebral white matter. 3. ASPECTS is 10. Electronically Signed   By: Marnee Spring M.D.   On: 03/06/2016 12:47     Scheduled Meds: .  stroke: mapping our early stages of recovery book   Does not apply Once  . sodium chloride   Intravenous STAT  . aspirin EC  325 mg Oral Daily  . atorvastatin  80 mg Oral q1800  . clopidogrel  75 mg Oral Daily  . enoxaparin (LOVENOX) injection  40 mg Subcutaneous Q24H  . insulin aspart  0-9 Units Subcutaneous TID WC   Continuous Infusions:   Pamella Pert, MD, PhD [facility] Pager [phone]  If 7PM-7AM, please contact night-coverage [email or web address] Password [email or web address] 03/07/2016, 11:42 AM

## 2016-03-07 NOTE — Progress Notes (Signed)
Modified Barium Swallow Progress Note  Patient Details  Name: Megan Atkinson MRN: [id number] Date of Birth: 04-Jul-1950  Today's Date: 03/07/2016  Modified Barium Swallow completed.  Full report located under Chart Review in the Imaging Section.  Brief recommendations include the following:  Clinical Impression  Pt has a mild pharyngeal dysphagia characterized by impaired timing that results in silent penetration before the swallow with thin liquids. Min cues for use of a chin tuck are effective at increasing airway protection. Suspect that delayed throat clearing and coughing at bedside was due to either increased volume or depth of airway invasion. Recommend regular diet textures and thin liquids but with use of chin tuck.    Swallow Evaluation Recommendations       SLP Diet Recommendations: Regular solids;Thin liquid   Liquid Administration via: Cup;Straw   Medication Administration: Whole meds with puree   Supervision: Patient able to self feed;Intermittent supervision to cue for compensatory strategies   Compensations: Slow rate;Small sips/bites;Chin tuck   Postural Changes: Seated upright at 90 degrees   Oral Care Recommendations: Oral care BID        Megan Atkinson, Megan Atkinson 03/07/2016,4:15 PM   Megan Atkinson, M.A. CCC-SLP [phone]

## 2016-03-07 NOTE — Progress Notes (Addendum)
STROKE TEAM PROGRESS NOTE   HISTORY OF PRESENT ILLNESS (per record) Megan Atkinson is a 66 y.o. female who presents with sudden onset vertigo and left-sided numbness/weakness that started around 10:30 AM. She states that they could've been his earliest 72 AM, and that currently she is continuing to have vertigo, but that her left-sided numbness and weakness is significantly improved. (LKW 03/06/5016 1000a) Even on arrival, it appeared that she had some left-sided weakness, but this improved over the course of her examination. Of note, about a week ago she had similar episode that was transient and improved. On exam, she had an NIH stroke scale of 2, Dr. Amada Jupiter discussed TPA with her son. One item of concern would be the symptoms that she had last week coupled with a subacute-appearing stroke on CT which I think would raise risk. That coupled with the fact that she has improving symptoms, he counseled against TPA and the family agreed. Patient was not administered IV t-PA secondary to mild symptoms, possible recent stroke. She was admitted for further evaluation and treatment.   SUBJECTIVE (INTERVAL HISTORY) Son is at bedside. Pt stated that she has been improved from yesterday. She still has left arm ataxia. Stroke work up consistent with left PICA occlusion. She stated that her glucose was in good control at home.    OBJECTIVE Temp:  [97.6 F (36.4 C)-98.4 F (36.9 C)] 98 F (36.7 C) (01/30 1009) Pulse Rate:  [50-86] 86 (01/30 1009) Cardiac Rhythm: Sinus bradycardia (01/30 0800) Resp:  [12-21] 20 (01/30 1009) BP: (141-177)/(53-136) 148/53 (01/30 1009) SpO2:  [97 %-100 %] 98 % (01/30 1009)  CBC:  Recent Labs Lab 03/06/16 1217 03/06/16 1223 03/07/16 0307  WBC 6.6  --  9.3  NEUTROABS 3.8  --   --   HGB 12.9 13.6 12.5  HCT 40.3 40.0 39.5  MCV 94.2  --  93.4  PLT 224  --  218    Basic Metabolic Panel:  Recent Labs Lab 03/06/16 1217 03/06/16 1223 03/07/16 0307  NA 140 144 142   K 3.8 3.7 3.9  CL 106 106 107  CO2 24  --  25  GLUCOSE 113* 116* 130*  BUN 21* 22* 16  CREATININE 0.98 1.00 0.84  CALCIUM 9.4  --  9.2    Lipid Panel:    Component Value Date/Time   CHOL 196 03/07/2016 0307   TRIG 58 03/07/2016 0307   HDL 47 03/07/2016 0307   CHOLHDL 4.2 03/07/2016 0307   VLDL 12 03/07/2016 0307   LDLCALC 137 (H) 03/07/2016 0307   HgbA1c:  Lab Results  Component Value Date   HGBA1C 6.3 04/05/2011   Urine Drug Screen: No results found for: LABOPIA, COCAINSCRNUR, LABBENZ, AMPHETMU, THCU, LABBARB    IMAGING I have personally reviewed the radiological images below and agree with the radiology interpretations.  Ct Head Code Stroke W/o Cm 03/06/2016 1. Moderate area of inferior left cerebellar infarction, favored subacute. 2. Mild chronic microvascular ischemic change in the cerebral white matter. 3. ASPECTS is 10.   Ct Angio Head W/cm &/or Wo Cm 03/07/2016 Poor visualization of LEFT posterior-inferior cerebellar artery, possibly occluded. Moderate focal stenosis LEFT vertebral artery. No emergent large vessel occlusion or severe stenosis.   Ct Angio Neck W/cm &/or Wo/cm 03/07/2016 No hemodynamically significant stenosis or acute vascular process in the neck.   Mr Brain Wo Contrast 03/07/2016 1. Left PICA occlusion with patchy acute infarcts affecting the left PICA territory of the cerebellum and also the left lateral  and central medulla. No associated hemorrhage or mass effect. 2. No other acute intracranial abnormality and otherwise mild for age nonspecific cerebral white matter signal changes.   2-D Echo pending   LE venous doppler pending   PHYSICAL EXAM  Temp:  [97.6 F (36.4 C)-98.6 F (37 C)] 97.7 F (36.5 C) (01/30 2144) Pulse Rate:  [54-88] 60 (01/30 2144) Resp:  [18-20] 18 (01/30 2144) BP: (148-177)/(48-82) 171/48 (01/30 2144) SpO2:  [97 %-100 %] 97 % (01/30 2144)  General - Morbid obesity, well developed, in no apparent  distress.  Ophthalmologic - Fundi not visualized due to noncooperation.  Cardiovascular - Regular rate and rhythm.  Mental Status -  Level of arousal and orientation to time, place, and person were intact. Language including expression, naming, repetition, comprehension was assessed and found intact. Fund of Knowledge was assessed and was intact.  Cranial Nerves II - XII - II - Visual field intact OU. III, IV, VI - Extraocular movements intact. V - Facial sensation intact bilaterally. VII - Facial movement intact bilaterally. VIII - Hearing & vestibular intact bilaterally. X - Palate elevates symmetrically. XI - Chin turning & shoulder shrug intact bilaterally. XII - Tongue protrusion intact.  Motor Strength - The patient's strength was normal in BUEs, and 3/5 BLEs and pronator drift was absent.  Bulk was normal and fasciculations were absent.   Motor Tone - Muscle tone was assessed at the neck and appendages and was normal.  Reflexes - The patient's reflexes were 1+ in all extremities and she had no pathological reflexes.  Sensory - Light touch, temperature/pinprick were assessed and were symmetrical.    Coordination - The patient had left FTN ataxia. Right FTN intact. LEs not able to perform HTS due to obesity  Tremor was absent.  Gait and Station - deferred.   ASSESSMENT/PLAN [name] is a 66 y.o. female with history of coronary artery disease, diabetes, hypertension, hyperlipidemia, obesity, obstructive sleep apnea presenting with Vertigo, left-sided numbness. She did not receive IV t-PA due to possible recent stroke 2 weeks ago, mild symptoms.   Stroke:  Left PICA territory infarcts embolic secondary to left PICA occlusion. Likely due to atherosclerosis with multiple stroke risk factors  Resultant  Left arm ataxia  CTA head possibly occluded left posterior inferior cerebellar artery. Moderate focal stenosis L VA  CTA neck no significant stenosis  MRI  left  PICA occlusion with patchy acute infarcts in the left PICA territory of the cerebellum and also in the left lateral and central medulla  2D Echo  pending   LE venous doppler pending  LDL 137  HgbA1c pending  UDS negative  Lovenox 40 mg sq daily for VTE prophylaxis  Diet NPO time specified  aspirin 325 mg daily prior to admission, now on aspirin 325 mg daily and clopidogrel 75 mg daily. Recommend to continue DAPT for 3 months and then plavix alone  Patient counseled to be compliant with her antithrombotic medications  Ongoing aggressive stroke risk factor management  Therapy recommendations:  CIR   Disposition:  pending   Hypertension  Stable  Permissive hypertension (OK if < 220/120) but gradually normalize in 5-7 days  Long-term BP goal normotensive  Hyperlipidemia  Home meds:  Pravachol 80 mg daily  changed to Lipitor 80 mg daily  LDL 137, goal < 70  Continue statin at discharge  Diabetes  HgbA1c pending , goal < 7.0  Hyperglycemia  SSI  UTI  UA WBC TNTC  Treatment per primary team  Other Stroke Risk Factors  Advanced age  Morbid Obesity, Body mass index is 55.87 kg/m., recommend weight loss, diet and exercise as appropriate   Coronary artery disease  obstructive sleep apnea, uses CPAP at home  Other Active Problems  .Chronic bilateral lower extremity edema  Hospital day # 1  Marvel Plan, MD PhD Stroke Neurology 03/07/2016 11:54 PM   To contact Stroke Continuity provider, please refer to [email or web address]. After hours, contact General Neurology

## 2016-03-07 NOTE — Progress Notes (Signed)
Reviewed & agree with plan  

## 2016-03-07 NOTE — Care Management Note (Signed)
Case Management Note  Patient Details  Name: Megan NielsenBeverley Reap MRN: [id number] Date of Birth: 06/26/1950  Subjective/Objective:         Patient was admitted with CVA. Lives at home alone. CM will follow for discharge needs pending PT/OT evals and physician orders.            Action/Plan:   Expected Discharge Date:                  Expected Discharge Plan:     In-House Referral:     Discharge planning Services     Post Acute Care Choice:    Choice offered to:     DME Arranged:    DME Agency:     HH Arranged:    HH Agency:     Status of Service:     If discussed at [employer] Length of Stay Meetings, dates discussed:    Additional Comments:  Anda KraftRobarge, Olukemi Panchal C, RN 03/07/2016, 11:02 AM

## 2016-03-07 NOTE — Evaluation (Signed)
Clinical/Bedside Swallow Evaluation Patient Details  Name: Megan Atkinson MRN: [id number] Date of Birth: 26-Aug-1950  Today's Date: 03/07/2016 Time: SLP Start Time (ACUTE ONLY): 1108 SLP Stop Time (ACUTE ONLY): 1122 SLP Time Calculation (min) (ACUTE ONLY): 14 min  Past Medical History:  Past Medical History:  Diagnosis Date  . CAD (coronary artery disease)    small NSTEMI 11/12: LHC 01/06/11:  LAD 90-95%, mD1 90%, pD2 50%,  pOM1 50%, RCA occluded,  EF > 65%;  CABG  12/12: L-LAD  . Diabetes mellitus   . Glucose intolerance (impaired glucose tolerance)    Hemoglobin A1c 6.4 in 12/2010  . HTN (hypertension)    Echo 01/06/11: EF 65%  . Hyperlipidemia   . Obesity    Past Surgical History:  Past Surgical History:  Procedure Laterality Date  . ABDOMINAL HYSTERECTOMY    . APPENDECTOMY    . CORONARY ARTERY BYPASS GRAFT  01/12/2011   Procedure: CORONARY ARTERY BYPASS GRAFTING (CABG);  Surgeon: Alleen BorneBryan K [name], MD;  [facility];  Service: Open Heart Surgery;  Laterality: N/A;  coronary artery bypass graft times one using left internal mammary artery . Attempted endoscopic saphenous vein harvest  . LEFT HEART CATHETERIZATION WITH CORONARY ANGIOGRAM N/A 01/06/2011   Procedure: LEFT HEART CATHETERIZATION WITH CORONARY ANGIOGRAM;  Surgeon: Rollene RotundaJames Hochrein, MD;  [facility];  Service: Cardiovascular;  Laterality: N/A;   HPI:  66 y.o.femalewith medical history significant of coronary artery disease, diabetes, hypertension, hyperlipidemia, obesity, obstructive sleep apnea, presents to the emergency room with chief complaint of sudden onset vertigo and left-sided weakness. MRI showed left PICA occlusion with patchy acute infarcts affecting the left PCA territory of the cerebellum and also the left lateral and central medulla.66 y.o.femalewith medical history significant of coronary artery disease, diabetes, hypertension, hyperlipidemia, obesity, obstructive sleep apnea, presents to the  emergency room with chief complaint of sudden onset vertigo and left-sided weakness. MRI showed left PICA occlusion with patchy acute infarcts affecting the left PCA territory of the cerebellum and also the left lateral and central medulla.   Assessment / Plan / Recommendation Clinical Impression  Pt has immediate and delayed throat clearing with all consistencies tested, which culminates with delayed coughing as trials continue. Given location of acute infarct, recommend to proceed with MBS to assess for least restrictive diet. Pt may have ice chips after oral care and meds crushed in puree pending testing.    Aspiration Risk  Moderate aspiration risk    Diet Recommendation NPO except meds;Ice chips PRN after oral care   Medication Administration: Crushed with puree    Other  Recommendations Oral Care Recommendations: Oral care QID;Oral care prior to ice chip/H20   Follow up Recommendations        Frequency and Duration            Prognosis Prognosis for Safe Diet Advancement: Good      Swallow Study   General HPI: 66 y.o.femalewith medical history significant of coronary artery disease, diabetes, hypertension, hyperlipidemia, obesity, obstructive sleep apnea, presents to the emergency room with chief complaint of sudden onset vertigo and left-sided weakness. MRI showed left PICA occlusion with patchy acute infarcts affecting the left PCA territory of the cerebellum and also the left lateral and central medulla.66 y.o.femalewith medical history significant of coronary artery disease, diabetes, hypertension, hyperlipidemia, obesity, obstructive sleep apnea, presents to the emergency room with chief complaint of sudden onset vertigo and left-sided weakness. MRI showed left PICA occlusion with patchy acute infarcts affecting the left  PCA territory of the cerebellum and also the left lateral and central medulla. Type of Study: Bedside Swallow Evaluation Previous Swallow Assessment:  none in chart Diet Prior to this Study: NPO Temperature Spikes Noted: No Respiratory Status: Room air History of Recent Intubation: No Behavior/Cognition: Alert;Cooperative;Pleasant mood Oral Cavity Assessment: Within Functional Limits Oral Care Completed by SLP: No Oral Cavity - Dentition: Dentures, top;Dentures, bottom Vision: Functional for self-feeding Self-Feeding Abilities: Able to feed self Patient Positioning: Upright in bed Baseline Vocal Quality: Other (comment) (rough) Volitional Cough: Strong Volitional Swallow: Unable to elicit    Oral/Motor/Sensory Function Overall Oral Motor/Sensory Function: Within functional limits   Ice Chips Ice chips: Within functional limits Presentation: Spoon   Thin Liquid Thin Liquid: Impaired Presentation: Cup;Self Fed Pharyngeal  Phase Impairments: Throat Clearing - Immediate;Throat Clearing - Delayed    Nectar Thick Nectar Thick Liquid: Not tested   Honey Thick Honey Thick Liquid: Not tested   Puree Puree: Impaired Presentation: Spoon;Self Fed Pharyngeal Phase Impairments: Throat Clearing - Immediate;Throat Clearing - Delayed;Cough - Delayed   Solid   GO   Solid: Impaired Presentation: Self Fed Pharyngeal Phase Impairments: Throat Clearing - Immediate;Throat Clearing - Delayed;Cough - Delayed        Maxcine Ham 03/07/2016,1:49 PM  Maxcine Ham, M.A. CCC-SLP [phone]

## 2016-03-08 ENCOUNTER — Inpatient Hospital Stay (HOSPITAL_COMMUNITY): Payer: Medicare Other

## 2016-03-08 DIAGNOSIS — E785 Hyperlipidemia, unspecified: Secondary | ICD-10-CM

## 2016-03-08 DIAGNOSIS — E669 Obesity, unspecified: Secondary | ICD-10-CM

## 2016-03-08 DIAGNOSIS — I10 Essential (primary) hypertension: Secondary | ICD-10-CM

## 2016-03-08 DIAGNOSIS — M7989 Other specified soft tissue disorders: Secondary | ICD-10-CM

## 2016-03-08 DIAGNOSIS — E1159 Type 2 diabetes mellitus with other circulatory complications: Secondary | ICD-10-CM

## 2016-03-08 DIAGNOSIS — R7303 Prediabetes: Secondary | ICD-10-CM

## 2016-03-08 DIAGNOSIS — I69393 Ataxia following cerebral infarction: Secondary | ICD-10-CM

## 2016-03-08 DIAGNOSIS — I2510 Atherosclerotic heart disease of native coronary artery without angina pectoris: Secondary | ICD-10-CM

## 2016-03-08 DIAGNOSIS — M79609 Pain in unspecified limb: Secondary | ICD-10-CM

## 2016-03-08 DIAGNOSIS — I63012 Cerebral infarction due to thrombosis of left vertebral artery: Secondary | ICD-10-CM

## 2016-03-08 DIAGNOSIS — I6789 Other cerebrovascular disease: Secondary | ICD-10-CM

## 2016-03-08 LAB — ECHOCARDIOGRAM COMPLETE
CHL CUP MV DEC (S): 342
CHL CUP RV SYS PRESS: 25 mmHg
CHL CUP TV REG PEAK VELOCITY: 233 cm/s
E decel time: 342 msec
E/e' ratio: 6.61
FS: 46 % — AB (ref 28–44)
Height: 67 in
IV/PV OW: 0.85
LA diam end sys: 40 mm
LADIAMINDEX: 1.54 cm/m2
LASIZE: 40 mm
LAVOL: 56.2 mL
LAVOLA4C: 56.6 mL
LAVOLIN: 21.7 mL/m2
LV PW d: 14.3 mm — AB (ref 0.6–1.1)
LVEEAVG: 6.61
LVEEMED: 6.61
LVELAT: 10.2 cm/s
LVOT area: 3.46 cm2
LVOT diameter: 21 mm
Lateral S' vel: 9.68 cm/s
MVPKAVEL: 105 m/s
MVPKEVEL: 67.4 m/s
PV Reg grad dias: 6 mmHg
PV Reg vel dias: 119 cm/s
TDI e' lateral: 10.2
TDI e' medial: 6.09
TRMAXVEL: 233 cm/s
Weight: 5707.27 oz

## 2016-03-08 LAB — GLUCOSE, CAPILLARY
GLUCOSE-CAPILLARY: 107 mg/dL — AB (ref 65–99)
GLUCOSE-CAPILLARY: 99 mg/dL (ref 65–99)
Glucose-Capillary: 135 mg/dL — ABNORMAL HIGH (ref 65–99)
Glucose-Capillary: 117 mg/dL — ABNORMAL HIGH (ref 65–99)

## 2016-03-08 LAB — HEMOGLOBIN A1C
Hgb A1c MFr Bld: 6.3 % — ABNORMAL HIGH (ref 4.8–5.6)
Mean Plasma Glucose: 134 mg/dL

## 2016-03-08 MED ORDER — PERFLUTREN LIPID MICROSPHERE
1.0000 mL | INTRAVENOUS | Status: AC | PRN
  Administered 2016-03-08: 2 mL via INTRAVENOUS
  Filled 2016-03-08: qty 10

## 2016-03-08 MED ORDER — PNEUMOCOCCAL VAC POLYVALENT 25 MCG/0.5ML IJ INJ
0.5000 mL | INJECTION | INTRAMUSCULAR | Status: AC
  Administered 2016-03-09: 0.5 mL via INTRAMUSCULAR
  Filled 2016-03-08: qty 0.5

## 2016-03-08 NOTE — Progress Notes (Addendum)
Speech Language Pathology Treatment: Dysphagia  Patient Details Name: Megan NielsenBeverley Atkinson MRN: [id number] DOB: 05/12/1950 Today's Date: 03/08/2016 Time: [id number] SLP Time Calculation (min) (ACUTE ONLY): 30 min  Assessment / Plan / Recommendation Clinical Impression  Skilled observation of intake of thin liquids with use of chin tuck technique with modified independence with verbal cueing provided by SLP only initially, but pt consistently utilized this technique while SLP was present.  She stated she also explained to nurse tech why she needed to use this technique during intake of lunch tray.  Pt provided with education re: consistent use of chin tuck to eliminate silent aspiration risk; noted dysphonic vocal quality when SLP arrived; pt in agreement with use of strategy during intake of all liquids; ST to f/u while in house for utilization/education for swallowing precautions/diet tolerance with meals.   HPI HPI: 66 y.o.femalewith medical history significant of coronary artery disease, diabetes, hypertension, hyperlipidemia, obesity, obstructive sleep apnea, presents to the emergency room with chief complaint of sudden onset vertigo and left-sided weakness. MRI showed left PICA occlusion with patchy acute infarcts affecting the left PCA territory of the cerebellum and also the left lateral and central medulla.66 y.o.femalewith medical history significant of coronary artery disease, diabetes, hypertension, hyperlipidemia, obesity, obstructive sleep apnea, presents to the emergency room with chief complaint of sudden onset vertigo and left-sided weakness. MRI showed left PICA occlusion with patchy acute infarcts affecting the left PCA territory of the cerebellum and also the left lateral and central medulla.      SLP Plan  Continue with current plan of care     Recommendations  Diet recommendations: Regular;Thin liquid;Other(comment) (chin tuck with liquids) Liquids provided via:  Cup;Straw Medication Administration: Crushed with puree Supervision: Patient able to self feed Compensations: Slow rate;Small sips/bites;Chin tuck Postural Changes and/or Swallow Maneuvers: Seated upright 90 degrees                General recommendations: Other(comment) (TBD) Oral Care Recommendations: Oral care BID Follow up Recommendations: Other (comment) (TBD) Plan: Continue with current plan of care                  Functional Assessment Tool Used: NOMS Functional Limitations: Swallowing Swallow Current Status ([id number](G8996): At least 20 percent but less than 40 percent impaired, limited or restricted Swallow Goal Status [phone](G8997): At least 1 percent but less than 20 percent impaired, limited or restricted Other Speech-Language Pathology Functional Limitation [phone](G9174): 0 percent impaired, limited or restricted    Addalyn Speedy,PAT, M.S., CCC-SLP 03/08/2016, 4:25 PM

## 2016-03-08 NOTE — Progress Notes (Addendum)
PT Cancellation Note  Patient Details Name: Megan NielsenBeverley Atkinson MRN: [id number] DOB: 03-11-1950   Cancelled Treatment:    Reason Eval/Treat Not Completed: Patient at procedure or test/unavailable (will f/u this pm)  Checked back two more times and patient down for doppler study then with SLP for treatment.  Will continue efforts to treat this pm.   Florestine AversAimee J Santanna Whitford 03/08/2016, 12:46 PM Joycelyn RuaAimee Cyana Shook, PTA pager [phone]

## 2016-03-08 NOTE — Progress Notes (Signed)
STROKE TEAM PROGRESS NOTE   SUBJECTIVE (INTERVAL HISTORY) Family members are at bedside. Pt stated that she still improved from yesterday, no acute event overnight.    OBJECTIVE Temp:  [97.7 F (36.5 C)-98.5 F (36.9 C)] 98.5 F (36.9 C) (01/31 2125) Pulse Rate:  [56-72] 72 (01/31 2125) Cardiac Rhythm: Normal sinus rhythm (01/31 1900) Resp:  [18-20] 20 (01/31 2125) BP: (138-165)/(55-76) 162/76 (01/31 2125) SpO2:  [98 %-100 %] 100 % (01/31 2125)  CBC:   Recent Labs Lab 03/06/16 1217 03/06/16 1223 03/07/16 0307  WBC 6.6  --  9.3  NEUTROABS 3.8  --   --   HGB 12.9 13.6 12.5  HCT 40.3 40.0 39.5  MCV 94.2  --  93.4  PLT 224  --  218    Basic Metabolic Panel:   Recent Labs Lab 03/06/16 1217 03/06/16 1223 03/07/16 0307  NA 140 144 142  K 3.8 3.7 3.9  CL 106 106 107  CO2 24  --  25  GLUCOSE 113* 116* 130*  BUN 21* 22* 16  CREATININE 0.98 1.00 0.84  CALCIUM 9.4  --  9.2    Lipid Panel:     Component Value Date/Time   CHOL 196 03/07/2016 0307   TRIG 58 03/07/2016 0307   HDL 47 03/07/2016 0307   CHOLHDL 4.2 03/07/2016 0307   VLDL 12 03/07/2016 0307   LDLCALC 137 (H) 03/07/2016 0307   HgbA1c:  Lab Results  Component Value Date   HGBA1C 6.3 (H) 03/07/2016   Urine Drug Screen:     Component Value Date/Time   LABOPIA NONE DETECTED 03/07/2016 2312   COCAINSCRNUR NONE DETECTED 03/07/2016 2312   LABBENZ NONE DETECTED 03/07/2016 2312   AMPHETMU NONE DETECTED 03/07/2016 2312   THCU NONE DETECTED 03/07/2016 2312   LABBARB NONE DETECTED 03/07/2016 2312      IMAGING I have personally reviewed the radiological images below and agree with the radiology interpretations.  Ct Head Code Stroke W/o Cm 03/06/2016 1. Moderate area of inferior left cerebellar infarction, favored subacute. 2. Mild chronic microvascular ischemic change in the cerebral white matter. 3. ASPECTS is 10.   Ct Angio Head W/cm &/or Wo Cm 03/07/2016 Poor visualization of LEFT  posterior-inferior cerebellar artery, possibly occluded. Moderate focal stenosis LEFT vertebral artery. No emergent large vessel occlusion or severe stenosis.   Ct Angio Neck W/cm &/or Wo/cm 03/07/2016 No hemodynamically significant stenosis or acute vascular process in the neck.   Mr Brain Wo Contrast 03/07/2016 1. Left PICA occlusion with patchy acute infarcts affecting the left PICA territory of the cerebellum and also the left lateral and central medulla. No associated hemorrhage or mass effect. 2. No other acute intracranial abnormality and otherwise mild for age nonspecific cerebral white matter signal changes.   2-D Echo Left ventricle: The cavity size was normal. Wall thickness was   increased in a pattern of mild LVH. Systolic function was normal.   The estimated ejection fraction was in the range of 60% to 65%.   Wall motion was normal; there were no regional wall motion   abnormalities. Doppler parameters are consistent with abnormal   left ventricular relaxation (grade 1 diastolic dysfunction). - Aortic valve: Mildly to moderately calcified annulus. Mildly   thickened leaflets.   LE venous doppler No evidence of deep vein thrombosis in the visualized veins of the lower extremities. Negative for baker's cysts. Difficult exam due to body habitus.   PHYSICAL EXAM  Temp:  [97.7 F (36.5 C)-98.5 F (36.9 C)]  98.5 F (36.9 C) (01/31 2125) Pulse Rate:  [56-72] 72 (01/31 2125) Resp:  [18-20] 20 (01/31 2125) BP: (138-165)/(55-76) 162/76 (01/31 2125) SpO2:  [98 %-100 %] 100 % (01/31 2125)  General - Morbid obesity, well developed, in no apparent distress.  Ophthalmologic - Fundi not visualized due to noncooperation.  Cardiovascular - Regular rate and rhythm.  Mental Status -  Level of arousal and orientation to time, place, and person were intact. Language including expression, naming, repetition, comprehension was assessed and found intact. Fund of Knowledge was assessed  and was intact.  Cranial Nerves II - XII - II - Visual field intact OU. III, IV, VI - Extraocular movements intact. V - Facial sensation intact bilaterally. VII - Facial movement intact bilaterally. VIII - Hearing & vestibular intact bilaterally. X - Palate elevates symmetrically. XI - Chin turning & shoulder shrug intact bilaterally. XII - Tongue protrusion intact.  Motor Strength - The patient's strength was normal in BUEs, and 3/5 BLEs and pronator drift was absent.  Bulk was normal and fasciculations were absent.   Motor Tone - Muscle tone was assessed at the neck and appendages and was normal.  Reflexes - The patient's reflexes were 1+ in all extremities and she had no pathological reflexes.  Sensory - Light touch, temperature/pinprick were assessed and were symmetrical.    Coordination - The patient had left FTN ataxia. Right FTN intact. LEs not able to perform HTS due to obesity  Tremor was absent.  Gait and Station - deferred.   ASSESSMENT/PLAN Ms. Gennesis Hogland is a 66 y.o. female with history of coronary artery disease, diabetes, hypertension, hyperlipidemia, obesity, obstructive sleep apnea presenting with Vertigo, left-sided numbness. She did not receive IV t-PA due to possible recent stroke 2 weeks ago, mild symptoms.   Stroke:  Left PICA territory infarcts embolic secondary to left PICA occlusion. Likely due to atherosclerosis with multiple stroke risk factors  Resultant  Left arm ataxia  CTA head possibly occluded left posterior inferior cerebellar artery. Moderate focal stenosis L VA  CTA neck no significant stenosis  MRI  left PICA occlusion with patchy acute infarcts in the left PICA territory of the cerebellum and also in the left lateral and central medulla  2D Echo  EF 60-65%   LE venous doppler no DVT  LDL 137  HgbA1c 6.3  UDS negative  Lovenox 40 mg sq daily for VTE prophylaxis Diet Carb Modified Fluid consistency: Thin; Room service  appropriate? Yes  aspirin 325 mg daily prior to admission, now on aspirin 325 mg daily and clopidogrel 75 mg daily. Recommend to continue DAPT for 3 months and then plavix alone  Patient counseled to be compliant with her antithrombotic medications  Ongoing aggressive stroke risk factor management  Therapy recommendations:  CIR   Disposition:  pending   Hypertension  Stable  Permissive hypertension (OK if < 220/120) but gradually normalize in 5-7 days  Long-term BP goal normotensive  Hyperlipidemia  Home meds:  Pravachol 80 mg daily  changed to Lipitor 80 mg daily  LDL 137, goal < 70  Continue statin at discharge  Diabetes  HgbA1c 6.3, goal < 7.0  Hyperglycemia  SSI  UTI  UA WBC TNTC  Treatment per primary team  Other Stroke Risk Factors  Advanced age  Morbid Obesity, Body mass index is 55.87 kg/m., recommend weight loss, diet and exercise as appropriate   Coronary artery disease  obstructive sleep apnea, uses CPAP at home  Other Active Problems  .Chronic bilateral  lower extremity edema - but no DVT  Hospital day # 2   Neurology will sign off. Please call with questions. Pt will follow up with Darrol Angel NP at [facility] in about 6 weeks. Thanks for the consult.   Marvel Plan, MD PhD Stroke Neurology 03/08/2016 11:53 PM   To contact Stroke Continuity provider, please refer to [email or web address]. After hours, contact General Neurology

## 2016-03-08 NOTE — Progress Notes (Signed)
Patient states that she doesn't wear CPAP and does not wish to wear one tonight.

## 2016-03-08 NOTE — Progress Notes (Signed)
*  PRELIMINARY RESULTS* Vascular Ultrasound Bilateral lower extremity venous duplex has been completed.  Preliminary findings: No evidence of deep vein thrombosis in the visualized veins of the lower extremities. Negative for baker's cysts. Difficult exam due to body habitus.   Chauncey FischerCharlotte C Sua Spadafora 03/08/2016, 2:28 PM

## 2016-03-08 NOTE — Progress Notes (Signed)
PROGRESS NOTE    Megan Atkinson  UJW:[id number] DOB: May 25, 1950 DOA: 03/06/2016 PCP: Sanda Linger, MD    Brief Narrative:  66 y.o.femalewith medical history significant of coronary artery disease, diabetes, hypertension, hyperlipidemia, obesity, obstructive sleep apnea, presents to the emergency room with chief complaint of sudden onset vertigo and left-sided weakness early on the morning of presentation  Assessment & Plan:   Active Problems:   CAD (coronary artery disease)   Hypertension   Diabetes mellitus (HCC)   Hyperlipidemia   OSA (obstructive sleep apnea)   Morbid obesity (HCC)   CVA (cerebral vascular accident) (HCC)   Acute cystitis without hematuria   Super obese (HCC)   Prediabetes   Ataxia, post-stroke   Acute CVA - Patient underwent an MRI which showed patchy acute infarcts in the left PICA territory of the cerebellum, as well as left lateral and central medulla. She also has left PICA occlusion.  - Neurology consulted and is following -  complete workup with 2-D echo, lipid panel, hemoglobin A1c, PT/OT/SLP.  - She failed swallow evaluation at bedside in the emergency room - Therapy recommendations for CIR. Have consulted CIR  Hypertension  - allow permissive hypertension - BP stable this afternoon  Diabetes mellitus  - doesn't appear to be on medications, may be diet controlled, continue on sliding scale - hgb a1c of 6.3  Hyperlipidemia - continue statin - Stable at present  Obstructive sleep apnea - CPAP daily at bedtime - Presently stable  CAD - Without chest pain, on aspirin.  - Stable at present - No wall motion abnormality on echo, reviewed   DVT prophylaxis: Lovenox subQ Code Status: Full Family Communication: Pt in room, family at bedside Disposition Plan: Possible CIR, timing uncertain  Consultants:   CIR  neurology  Procedures:     Antimicrobials: Anti-infectives    None       Subjective: No  complaints  Objective: Vitals:   03/08/16 0113 03/08/16 0459 03/08/16 0900 03/08/16 1348  BP: (!) 157/63 (!) 160/55 (!) 165/58 138/68  Pulse: 61 (!) 56 65 67  Resp: 18 20 18 20   Temp: 97.7 F (36.5 C) 98.1 F (36.7 C) 98.1 F (36.7 C)   TempSrc: Oral Oral Oral   SpO2: 98% 100% 100% 100%  Weight:      Height:        Intake/Output Summary (Last 24 hours) at 03/08/16 1725 Last data filed at 03/08/16 1300  Gross per 24 hour  Intake              960 ml  Output                0 ml  Net              960 ml   Filed Weights   03/06/16 1200  Weight: (!) 161.8 kg (356 lb 11.3 oz)    Examination:  General exam: Appears calm and comfortable  Respiratory system: Clear to auscultation. Respiratory effort normal. Cardiovascular system: S1 & S2 heard, RRR Gastrointestinal system: Abdomen is nondistended, soft and nontender. No organomegaly or masses felt. Normal bowel sounds heard. Central nervous system: Alert and oriented. No focal neurological deficits. Extremities: Symmetric 5 x 5 power. Skin: No rashes, lesions  Psychiatry: Judgement and insight appear normal. Mood & affect appropriate.   Data Reviewed: I have personally reviewed following labs and imaging studies  CBC:  Recent Labs Lab 03/06/16 1217 03/06/16 1223 03/07/16 0307  WBC 6.6  --  9.3  NEUTROABS 3.8  --   --   HGB 12.9 13.6 12.5  HCT 40.3 40.0 39.5  MCV 94.2  --  93.4  PLT 224  --  218   Basic Metabolic Panel:  Recent Labs Lab 03/06/16 1217 03/06/16 1223 03/07/16 0307  NA 140 144 142  K 3.8 3.7 3.9  CL 106 106 107  CO2 24  --  25  GLUCOSE 113* 116* 130*  BUN 21* 22* 16  CREATININE 0.98 1.00 0.84  CALCIUM 9.4  --  9.2   GFR: Estimated Creatinine Clearance: 104.3 mL/min (by C-G formula based on SCr of 0.84 mg/dL). Liver Function Tests:  Recent Labs Lab 03/06/16 1217  AST 14*  ALT 15  ALKPHOS 78  BILITOT 0.3  PROT 7.0  ALBUMIN 3.5   No results for input(s): LIPASE, AMYLASE in the last  168 hours. No results for input(s): AMMONIA in the last 168 hours. Coagulation Profile:  Recent Labs Lab 03/06/16 1217  INR 0.97   Cardiac Enzymes: No results for input(s): CKTOTAL, CKMB, CKMBINDEX, TROPONINI in the last 168 hours. BNP (last 3 results) No results for input(s): PROBNP in the last 8760 hours. HbA1C:  Recent Labs  03/07/16 0307  HGBA1C 6.3*   CBG:  Recent Labs Lab 03/07/16 1840 03/07/16 2149 03/08/16 0603 03/08/16 1236 03/08/16 1651  GLUCAP 168* 105* 107* 135* 99   Lipid Profile:  Recent Labs  03/07/16 0307  CHOL 196  HDL 47  LDLCALC 137*  TRIG 58  CHOLHDL 4.2   Thyroid Function Tests: No results for input(s): TSH, T4TOTAL, FREET4, T3FREE, THYROIDAB in the last 72 hours. Anemia Panel: No results for input(s): VITAMINB12, FOLATE, FERRITIN, TIBC, IRON, RETICCTPCT in the last 72 hours. Sepsis Labs: No results for input(s): PROCALCITON, LATICACIDVEN in the last 168 hours.  No results found for this or any previous visit (from the past 240 hour(s)).   Radiology Studies: Ct Angio Head W/cm &/or Wo Cm  Result Date: 03/07/2016 CLINICAL DATA:  Follow-up stroke. History of hypertension, hyperlipidemia and diabetes. EXAM: CT ANGIOGRAPHY HEAD AND NECK TECHNIQUE: Multidetector CT imaging of the head and neck was performed using the standard protocol during bolus administration of intravenous contrast. Multiplanar CT image reconstructions and MIPs were obtained to evaluate the vascular anatomy. Carotid stenosis measurements (when applicable) are obtained utilizing NASCET criteria, using the distal internal carotid diameter as the denominator. CONTRAST:  50 cc Isovue 370 COMPARISON:  CT HEAD March 06, 2016 FINDINGS: CTA NECK AORTIC ARCH: Normal appearance of the thoracic arch, normal branch pattern. Mild calcific atherosclerosis of aortic arch. The origins of the innominate, left Common carotid artery and subclavian artery are widely patent. RIGHT CAROTID  SYSTEM: Common carotid artery is widely patent, coursing in a straight line fashion. Normal appearance of the carotid bifurcation without hemodynamically significant stenosis by NASCET criteria. Normal appearance of the included internal carotid artery. LEFT CAROTID SYSTEM: Common carotid artery is widely patent, coursing in a straight line fashion. Normal appearance of the carotid bifurcation without hemodynamically significant stenosis by NASCET criteria. Normal appearance of the included internal carotid artery. VERTEBRAL ARTERIES:Left vertebral artery is dominant. Normal appearance of the vertebral arteries, which appear widely patent. SKELETON: No acute osseous process though bone windows have not been submitted. Multiple bilateral subcentimeter retro pectoral lymph nodes. Patient is edentulous. Moderate degenerative change of the cervical spine. OTHER NECK: Soft tissues of the neck are non-acute though, not tailored for evaluation. Status post median sternotomy. CTA HEAD ANTERIOR CIRCULATION: Normal appearance of  the cervical internal carotid arteries, petrous, cavernous and supra clinoid internal carotid arteries. Widely patent anterior communicating artery. Normal appearance of the anterior and middle cerebral arteries. No large vessel occlusion, hemodynamically significant stenosis, dissection, luminal irregularity, contrast extravasation or aneurysm. POSTERIOR CIRCULATION: Moderate focal stenosis LEFT V4 segment due to eccentric calcific atherosclerosis. Normal appearance of the vertebrobasilar junction and basilar artery, as well as main branch vessels with exception of LEFT posterior-inferior cerebellar artery. Normal appearance of the posterior cerebral arteries, mild stenosis RIGHT P1 origin. Small RIGHT posterior communicating artery present. No large vessel occlusion, hemodynamically significant stenosis, dissection, luminal irregularity, contrast extravasation or aneurysm. VENOUS SINUSES: Major  dural venous sinuses are patent though not tailored for evaluation on this angiographic examination. ANATOMIC VARIANTS: None. DELAYED PHASE: Not performed. IMPRESSION: CTA NECK: No hemodynamically significant stenosis or acute vascular process in the neck. CTA HEAD: Poor visualization of LEFT posterior-inferior cerebellar artery, possibly occluded. Moderate focal stenosis LEFT vertebral artery. No emergent large vessel occlusion or severe stenosis. Electronically Signed   By: Awilda Metro M.D.   On: 03/07/2016 01:35   Ct Angio Neck W/cm &/or Wo/cm  Result Date: 03/07/2016 CLINICAL DATA:  Follow-up stroke. History of hypertension, hyperlipidemia and diabetes. EXAM: CT ANGIOGRAPHY HEAD AND NECK TECHNIQUE: Multidetector CT imaging of the head and neck was performed using the standard protocol during bolus administration of intravenous contrast. Multiplanar CT image reconstructions and MIPs were obtained to evaluate the vascular anatomy. Carotid stenosis measurements (when applicable) are obtained utilizing NASCET criteria, using the distal internal carotid diameter as the denominator. CONTRAST:  50 cc Isovue 370 COMPARISON:  CT HEAD March 06, 2016 FINDINGS: CTA NECK AORTIC ARCH: Normal appearance of the thoracic arch, normal branch pattern. Mild calcific atherosclerosis of aortic arch. The origins of the innominate, left Common carotid artery and subclavian artery are widely patent. RIGHT CAROTID SYSTEM: Common carotid artery is widely patent, coursing in a straight line fashion. Normal appearance of the carotid bifurcation without hemodynamically significant stenosis by NASCET criteria. Normal appearance of the included internal carotid artery. LEFT CAROTID SYSTEM: Common carotid artery is widely patent, coursing in a straight line fashion. Normal appearance of the carotid bifurcation without hemodynamically significant stenosis by NASCET criteria. Normal appearance of the included internal carotid artery.  VERTEBRAL ARTERIES:Left vertebral artery is dominant. Normal appearance of the vertebral arteries, which appear widely patent. SKELETON: No acute osseous process though bone windows have not been submitted. Multiple bilateral subcentimeter retro pectoral lymph nodes. Patient is edentulous. Moderate degenerative change of the cervical spine. OTHER NECK: Soft tissues of the neck are non-acute though, not tailored for evaluation. Status post median sternotomy. CTA HEAD ANTERIOR CIRCULATION: Normal appearance of the cervical internal carotid arteries, petrous, cavernous and supra clinoid internal carotid arteries. Widely patent anterior communicating artery. Normal appearance of the anterior and middle cerebral arteries. No large vessel occlusion, hemodynamically significant stenosis, dissection, luminal irregularity, contrast extravasation or aneurysm. POSTERIOR CIRCULATION: Moderate focal stenosis LEFT V4 segment due to eccentric calcific atherosclerosis. Normal appearance of the vertebrobasilar junction and basilar artery, as well as main branch vessels with exception of LEFT posterior-inferior cerebellar artery. Normal appearance of the posterior cerebral arteries, mild stenosis RIGHT P1 origin. Small RIGHT posterior communicating artery present. No large vessel occlusion, hemodynamically significant stenosis, dissection, luminal irregularity, contrast extravasation or aneurysm. VENOUS SINUSES: Major dural venous sinuses are patent though not tailored for evaluation on this angiographic examination. ANATOMIC VARIANTS: None. DELAYED PHASE: Not performed. IMPRESSION: CTA NECK: No hemodynamically significant stenosis or acute  vascular process in the neck. CTA HEAD: Poor visualization of LEFT posterior-inferior cerebellar artery, possibly occluded. Moderate focal stenosis LEFT vertebral artery. No emergent large vessel occlusion or severe stenosis. Electronically Signed   By: Awilda Metroourtnay  Bloomer M.D.   On: 03/07/2016  01:35   Mr Brain Wo Contrast  Result Date: 03/07/2016 CLINICAL DATA:  66 year old female code stroke, left side weakness and numbness. Left cerebellar infarct on presentation head CT 03/06/2016. Suspicion of left PICA occlusion on CTA head 0135 hours today. Initial encounter. EXAM: MRI HEAD WITHOUT CONTRAST TECHNIQUE: Multiplanar, multiecho pulse sequences of the brain and surrounding structures were obtained without intravenous contrast. COMPARISON:  CTA head and neck 0135 hours today. Head CT without contrast 03/06/2016 FINDINGS: Brain: Patchy and confluent restricted diffusion in the inferior left cerebellum and also affecting the left lateral and central medulla (series 3, image 11). Associated T2 and FLAIR hyperintensity. No associated hemorrhage. No significant mass effect. No other restricted diffusion. No midline shift, mass effect, evidence of mass lesion, ventriculomegaly, extra-axial collection or acute intracranial hemorrhage. Cervicomedullary junction and pituitary are within normal limits. Dural calcifications, especially along the superior interhemispheric fissure. Mild for age nonspecific periventricular white matter T2 and FLAIR hyperintensity. No cortical encephalomalacia. No definite chronic cerebral blood products. Cerebral volume is within normal limits for age. Vascular: Major intracranial vascular flow voids are preserved. Suspected thrombus in the left PICA as seen on series 9, image 6 - the same segment which was nonvisualized by CTA. Skull and upper cervical spine: Negative. Normal bone marrow signal. Sinuses/Orbits: Left gaze deviation. Otherwise negative orbit soft tissues. Visualized paranasal sinuses and mastoids are stable and well pneumatized. Other: Visible internal auditory structures appear normal. Negative scalp soft tissues. IMPRESSION: 1. Left PICA occlusion with patchy acute infarcts affecting the left PICA territory of the cerebellum and also the left lateral and central  medulla. No associated hemorrhage or mass effect. 2. No other acute intracranial abnormality and otherwise mild for age nonspecific cerebral white matter signal changes. Electronically Signed   By: Odessa FlemingH  Hall M.D.   On: 03/07/2016 07:15   Dg Swallowing Func-speech Pathology  Result Date: 03/07/2016 Objective Swallowing Evaluation: Type of Study: MBS-Modified Barium Swallow Study Patient Details Name: Megan NielsenBeverley Atkinson MRN: [id number] Date of Birth: 1951-01-05 Today's Date: 03/07/2016 Time: SLP Start Time (ACUTE ONLY): 1425-SLP Stop Time (ACUTE ONLY): 1446 SLP Time Calculation (min) (ACUTE ONLY): 21 min Past Medical History: Past Medical History: Diagnosis Date . CAD (coronary artery disease)   small NSTEMI 11/12: LHC 01/06/11:  LAD 90-95%, mD1 90%, pD2 50%,  pOM1 50%, RCA occluded,  EF > 65%;  CABG  12/12: L-LAD . Diabetes mellitus  . Glucose intolerance (impaired glucose tolerance)   Hemoglobin A1c 6.4 in 12/2010 . HTN (hypertension)   Echo 01/06/11: EF 65% . Hyperlipidemia  . Obesity  Past Surgical History: Past Surgical History: Procedure Laterality Date . ABDOMINAL HYSTERECTOMY   . APPENDECTOMY   . CORONARY ARTERY BYPASS GRAFT  01/12/2011  Procedure: CORONARY ARTERY BYPASS GRAFTING (CABG);  Surgeon: Alleen BorneBryan K [name], MD;  [facility];  Service: Open Heart Surgery;  Laterality: N/A;  coronary artery bypass graft times one using left internal mammary artery . Attempted endoscopic saphenous vein harvest . LEFT HEART CATHETERIZATION WITH CORONARY ANGIOGRAM N/A 01/06/2011  Procedure: LEFT HEART CATHETERIZATION WITH CORONARY ANGIOGRAM;  Surgeon: Rollene RotundaJames Hochrein, MD;  [facility];  Service: Cardiovascular;  Laterality: N/A; HPI: 66 y.o.femalewith medical history significant of coronary artery disease, diabetes, hypertension, hyperlipidemia, obesity, obstructive  sleep apnea, presents to the emergency room with chief complaint of sudden onset vertigo and left-sided weakness. MRI showed left PICA occlusion with  patchy acute infarcts affecting the left PCA territory of the cerebellum and also the left lateral and central medulla.65 y.o.femalewith medical history significant of coronary artery disease, diabetes, hypertension, hyperlipidemia, obesity, obstructive sleep apnea, presents to the emergency room with chief complaint of sudden onset vertigo and left-sided weakness. MRI showed left PICA occlusion with patchy acute infarcts affecting the left PCA territory of the cerebellum and also the left lateral and central medulla. Subjective: pt alert, hungry Assessment / Plan / Recommendation CHL IP CLINICAL IMPRESSIONS 03/07/2016 Therapy Diagnosis Mild pharyngeal phase dysphagia Clinical Impression Pt has a mild pharyngeal dysphagia characterized by impaired timing that results in silent penetration before the swallow with thin liquids. Min cues for use of a chin tuck are effective at increasing airway protection. Suspect that delayed throat clearing and coughing at bedside was due to either increased volume or depth of airway invasion. Recommend regular diet textures and thin liquids but with use of chin tuck.  Impact on safety and function Mild aspiration risk   CHL IP TREATMENT RECOMMENDATION 03/07/2016 Treatment Recommendations Therapy as outlined in treatment plan below   Prognosis 03/07/2016 Prognosis for Safe Diet Advancement Good Barriers to Reach Goals -- Barriers/Prognosis Comment -- CHL IP DIET RECOMMENDATION 03/07/2016 SLP Diet Recommendations Regular solids;Thin liquid Liquid Administration via Cup;Straw Medication Administration Whole meds with puree Compensations Slow rate;Small sips/bites;Chin tuck Postural Changes Seated upright at 90 degrees   CHL IP OTHER RECOMMENDATIONS 03/07/2016 Recommended Consults -- Oral Care Recommendations Oral care BID Other Recommendations --   CHL IP FOLLOW UP RECOMMENDATIONS 03/07/2016 Follow up Recommendations (No Data)   CHL IP FREQUENCY AND DURATION 03/07/2016 Speech Therapy  Frequency (ACUTE ONLY) min 2x/week Treatment Duration 2 weeks      CHL IP ORAL PHASE 03/07/2016 Oral Phase WFL Oral - Pudding Teaspoon -- Oral - Pudding Cup -- Oral - Honey Teaspoon -- Oral - Honey Cup -- Oral - Nectar Teaspoon -- Oral - Nectar Cup -- Oral - Nectar Straw -- Oral - Thin Teaspoon -- Oral - Thin Cup -- Oral - Thin Straw -- Oral - Puree -- Oral - Mech Soft -- Oral - Regular -- Oral - Multi-Consistency -- Oral - Pill -- Oral Phase - Comment --  CHL IP PHARYNGEAL PHASE 03/07/2016 Pharyngeal Phase Impaired Pharyngeal- Pudding Teaspoon -- Pharyngeal -- Pharyngeal- Pudding Cup -- Pharyngeal -- Pharyngeal- Honey Teaspoon -- Pharyngeal -- Pharyngeal- Honey Cup -- Pharyngeal -- Pharyngeal- Nectar Teaspoon -- Pharyngeal -- Pharyngeal- Nectar Cup -- Pharyngeal -- Pharyngeal- Nectar Straw -- Pharyngeal -- Pharyngeal- Thin Teaspoon -- Pharyngeal -- Pharyngeal- Thin Cup Delayed swallow initiation-pyriform sinuses;Compensatory strategies attempted (with notebox);Penetration/Aspiration before swallow Pharyngeal Material enters airway, remains ABOVE vocal cords and not ejected out Pharyngeal- Thin Straw Delayed swallow initiation-pyriform sinuses;Compensatory strategies attempted (with notebox) Pharyngeal -- Pharyngeal- Puree WFL Pharyngeal -- Pharyngeal- Mechanical Soft -- Pharyngeal -- Pharyngeal- Regular WFL Pharyngeal -- Pharyngeal- Multi-consistency -- Pharyngeal -- Pharyngeal- Pill -- Pharyngeal -- Pharyngeal Comment --  CHL IP CERVICAL ESOPHAGEAL PHASE 03/07/2016 Cervical Esophageal Phase WFL Pudding Teaspoon -- Pudding Cup -- Honey Teaspoon -- Honey Cup -- Nectar Teaspoon -- Nectar Cup -- Nectar Straw -- Thin Teaspoon -- Thin Cup -- Thin Straw -- Puree -- Mechanical Soft -- Regular -- Multi-consistency -- Pill -- Cervical Esophageal Comment -- No flowsheet data found. Maxcine Ham 03/07/2016, 4:14 PM Maxcine Ham, M.A. CCC-SLP [phone]  Scheduled Meds: . aspirin EC  325 mg Oral Daily   . atorvastatin  80 mg Oral q1800  . clopidogrel  75 mg Oral Daily  . enoxaparin (LOVENOX) injection  40 mg Subcutaneous Q24H  . insulin aspart  0-9 Units Subcutaneous TID WC  . [START ON 03/09/2016] pneumococcal 23 valent vaccine  0.5 mL Intramuscular Tomorrow-1000   Continuous Infusions:   LOS: 2 days   CHIU, Scheryl Marten, MD [facility] Pager [phone]  If 7PM-7AM, please contact night-coverage [email or web address] Password [email or web address] 03/08/2016, 5:25 PM

## 2016-03-08 NOTE — Progress Notes (Signed)
Patient refused CPAP. RT will continue to monitor.  

## 2016-03-08 NOTE — Evaluation (Addendum)
Speech Language Pathology Evaluation Patient Details Name: Megan NielsenBeverley Atkinson MRN: [id number] DOB: [birth year] Today's Date: 03/08/2016 Time: [id number] SLP Time Calculation (min) (ACUTE ONLY): 30 min  Problem List:  Patient Active Problem List   Diagnosis Date Noted  . Acute cystitis without hematuria   . CVA (cerebral vascular accident) (HCC) 03/06/2016  . Morbid obesity (HCC) 02/29/2016  . OSA (obstructive sleep apnea) 11/24/2014  . Dyspnea 10/02/2014  . Anemia 04/05/2011  . Urinary tract infection, site not specified 04/05/2011  . Diabetes mellitus (HCC) 02/22/2011  . Hyperlipidemia 02/22/2011  . CAD (coronary artery disease) 01/07/2011  . Hypertension 01/07/2011   Past Medical History:  Past Medical History:  Diagnosis Date  . CAD (coronary artery disease)    small NSTEMI 11/12: LHC 01/06/11:  LAD 90-95%, mD1 90%, pD2 50%,  pOM1 50%, RCA occluded,  EF > 65%;  CABG  12/12: L-LAD  . Diabetes mellitus   . Glucose intolerance (impaired glucose tolerance)    Hemoglobin A1c 6.4 in 12/2010  . HTN (hypertension)    Echo 01/06/11: EF 65%  . Hyperlipidemia   . Obesity    Past Surgical History:  Past Surgical History:  Procedure Laterality Date  . ABDOMINAL HYSTERECTOMY    . APPENDECTOMY    . CORONARY ARTERY BYPASS GRAFT  01/12/2011   Procedure: CORONARY ARTERY BYPASS GRAFTING (CABG);  Surgeon: Alleen BorneBryan K [name], MD;  [facility];  Service: Open Heart Surgery;  Laterality: N/A;  coronary artery bypass graft times one using left internal mammary artery . Attempted endoscopic saphenous vein harvest  . LEFT HEART CATHETERIZATION WITH CORONARY ANGIOGRAM N/A 01/06/2011   Procedure: LEFT HEART CATHETERIZATION WITH CORONARY ANGIOGRAM;  Surgeon: Rollene RotundaJames Hochrein, MD;  [facility];  Service: Cardiovascular;  Laterality: N/A;   HPI:  66 y.o.femalewith medical history significant of coronary artery disease, diabetes, hypertension, hyperlipidemia, obesity, obstructive sleep apnea,  presents to the emergency room with chief complaint of sudden onset vertigo and left-sided weakness. MRI showed left PICA occlusion with patchy acute infarcts affecting the left PCA territory of the cerebellum and also the left lateral and central medulla.66 y.o.femalewith medical history significant of coronary artery disease, diabetes, hypertension, hyperlipidemia, obesity, obstructive sleep apnea, presents to the emergency room with chief complaint of sudden onset vertigo and left-sided weakness. MRI showed left PICA occlusion with patchy acute infarcts affecting the left PCA territory of the cerebellum and also the left lateral and central medulla.   Assessment / Plan / Recommendation Clinical Impression   [facility] H[facility](Montreal Cognitive Assessment) and achieved a score of 25/30 with 26/30 being considered within normal range; pt perseverated on word fluency task, but it was with a time constraint given, which may have affected this task; pt appears to exhibit cognitive skills that are [facility] at this time, but further assessment may be warranted; pt able to state swallowing precautions without verbal cueing from SLP and stated she "told the nurse tech about strategy during meal intake"; dysphagia tx indicated at this time d/t compensatory strategies required for safe intake of thin liquids.    SLP Assessment  Patient does not need any further Speech Language Pathology Services for cognition per Cognitive assessment given; dysphagia tx indicated    Follow Up Recommendations  Other (comment) (TBD)    Frequency and Duration min 2x/week  1 week      SLP Evaluation Cognition  Overall Cognitive Status: Within Functional Limits for tasks assessed Arousal/Alertness: Awake/alert Orientation Level: Oriented X4 Memory: Appears intact Awareness:  Appears intact Problem Solving: Appears intact Safety/Judgment: Appears intact       Comprehension  Auditory Comprehension Overall Auditory  Comprehension: Appears within functional limits for tasks assessed Yes/No Questions: Within Functional Limits Commands: Within Functional Limits Conversation: Complex Visual Recognition/Discrimination Discrimination: Within Function Limits Reading Comprehension Reading Status: Within funtional limits    Expression Expression Primary Mode of Expression: Verbal Verbal Expression Overall Verbal Expression: Appears within functional limits for tasks assessed Initiation: No impairment Level of Generative/Spontaneous Verbalization: Conversation Repetition: No impairment Naming: No impairment Pragmatics: No impairment Non-Verbal Means of Communication: Not applicable Written Expression Dominant Hand: Right Written Expression: Within Functional Limits   Oral / Motor  Oral Motor/Sensory Function Overall Oral Motor/Sensory Function: Within functional limits Motor Speech Overall Motor Speech: Appears within functional limits for tasks assessed Respiration: Within functional limits Phonation:  (Minimal hoarseness noted) Resonance: Within functional limits Articulation: Within functional limitis Intelligibility: Intelligible Motor Planning: Witnin functional limits Motor Speech Errors: Not applicable            Functional Assessment Tool Used: NOMS Functional Limitations: Other Speech Language Pathology Other Speech-Language Pathology Functional Limitation [phone]): 0 percent impaired, limited or restricted         Chia Rock,PAT, M.S.,CCC-SLP 03/08/2016, 4:14 PM

## 2016-03-08 NOTE — Consult Note (Signed)
Physical Medicine and Rehabilitation Consult  Reason for Consult: Left sided weakness and dizziness.  Referring Physician: Dr. Roda Shutters   HPI: Megan Atkinson is a 66 y.o. female with history of CAD, HTN, morbid obesity who was admitted on 03/06/16 with vertigo and left sided weakness with numbness. CT head reviewed, showing left cerebellar infarct, thought to be subacute. TPA not administered. CTA head/neck no significant stenosis in neck and moderate focal stenosis left VA.  2D echo with eF 60-65% with mild to moderately calcified aortic valve and grade I diastolic dysfunction. BLE dopplers negative for DVT. Dr. Roda Shutters felt that stroke due to atherosclerosis and multiple risk factors.  He recommended ASA/Plavix for three months followed by plavix alone.  Therapy evaluations done revealing visual deficits with vestibular issues, difficulty with weight shifting and unsafe to ambulate. CIR recommended for follow up therapy.   Review of Systems  HENT: Negative for ear pain, hearing loss and tinnitus.   Eyes: Negative for blurred vision and double vision.  Respiratory: Positive for shortness of breath. Negative for cough.   Cardiovascular: Positive for leg swelling. Negative for chest pain and palpitations.  Gastrointestinal: Negative for constipation, heartburn and nausea.  Genitourinary: Negative for dysuria.       Stress incontinence   Musculoskeletal: Positive for joint pain (knee pain/weak knees). Negative for back pain, myalgias and neck pain.  Neurological: Positive for speech change. Negative for dizziness and headaches.  Psychiatric/Behavioral: Negative for depression. The patient is not nervous/anxious and does not have insomnia.   All other systems reviewed and are negative.     Past Medical History:  Diagnosis Date  . CAD (coronary artery disease)    small NSTEMI 11/12: LHC 01/06/11:  LAD 90-95%, mD1 90%, pD2 50%,  pOM1 50%, RCA occluded,  EF > 65%;  CABG  12/12: L-LAD  . Diabetes  mellitus   . Glucose intolerance (impaired glucose tolerance)    Hemoglobin A1c 6.4 in 12/2010  . HTN (hypertension)    Echo 01/06/11: EF 65%  . Hyperlipidemia   . Obesity     Past Surgical History:  Procedure Laterality Date  . ABDOMINAL HYSTERECTOMY    . APPENDECTOMY    . CORONARY ARTERY BYPASS GRAFT  01/12/2011   Procedure: CORONARY ARTERY BYPASS GRAFTING (CABG);  Surgeon: Alleen Borne, MD;  Location: [facility] OR;  Service: Open Heart Surgery;  Laterality: N/A;  coronary artery bypass graft times one using left internal mammary artery . Attempted endoscopic saphenous vein harvest  . LEFT HEART CATHETERIZATION WITH CORONARY ANGIOGRAM N/A 01/06/2011   Procedure: LEFT HEART CATHETERIZATION WITH CORONARY ANGIOGRAM;  Surgeon: Rollene Rotunda, MD;  Location: [facility] CATH LAB;  Service: Cardiovascular;  Laterality: N/A;    Family History  Problem Relation Age of Onset  . Coronary artery disease      No family history    Social History:  Married. Husband with multiple medical issues--can provide supervision after discharge. 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 She  reports that she has never smoked. She has never used smokeless tobacco. She reports that she drinks alcohol on rare occasions. She reports that she does not use drugs.    Allergies  Allergen Reactions  . Lisinopril     cough  . Metformin And Related Diarrhea    A lot of fluid loss from bowel    Medications Prior to Admission  Medication Sig Dispense Refill  . aspirin 325 MG tablet Take 325  mg by mouth daily.      . Calcium Carbonate-Vitamin D (CALCIUM-VITAMIN D) 500-200 MG-UNIT tablet Take 1 tablet by mouth daily.    Marland Kitchen glucosamine-chondroitin 500-400 MG tablet Take 1 tablet by mouth daily.      . metoprolol (LOPRESSOR) 50 MG tablet TAKE ONE TABLET BY MOUTH TWICE DAILY 60 tablet 2  . pravastatin (PRAVACHOL) 80 MG tablet TAKE ONE TABLET BY MOUTH ONCE DAILY 90 tablet 2  . Cyanocobalamin (VITAMIN  B-12 IJ) weekly    . furosemide (LASIX) 20 MG tablet TAKE ONE TABLET BY MOUTH ONCE DAILY (Patient not taking: Reported on 03/06/2016) 30 tablet 0  . Ginkgo 60 MG TABS Take 1 tablet by mouth daily. For memory     . Green Tea, Camillia sinensis, 1000 MG TABS Take 1 tablet by mouth daily.       Home: Home Living Family/patient expects to be discharged to:: Inpatient rehab Living Arrangements: Alone Available Help at Discharge: Family, Available 24 hours/day, Available PRN/intermittently, Friend(s) (reports a retired friend down the street can stay. son works) Type of Home: [employer] Access: Stairs to enter [employer] of Steps: 6 (but reports level entry at front Home Layout: Two level, 1/2 bath on main level (plans to stay on main level) Bathroom Shower/Tub: Engineer, manufacturing systems: Standard Home Equipment: Environmental consultant - 2 wheels  Functional History: Prior Function Level of Independence: Independent Comments: Has walker but does not use this. 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 Functional Status:  Mobility: Bed Mobility Overal bed mobility: Needs Assistance Bed Mobility: Supine to Sit Supine to sit: Mod assist General bed mobility comments: assisted by OT prior to my arrival Transfers Overall transfer level: Needs assistance Equipment used: Rolling walker (2 wheeled) Transfer via Lift Equipment: Stedy Transfers: Sit to/from [employer] to Stand: Max assist, +2 physical assistance General transfer comment: sit to stand with +2 A needed to weight shift her to R to achieve upright standing, with stedy able to stand easier still leaning L Ambulation/Gait General Gait Details: not tested, not safe    ADL: ADL Overall ADL's : Needs assistance/impaired Grooming: Sitting, Minimal assistance Upper Body Bathing: Moderate assistance, Sitting Upper Body Bathing Details (indicate cue type and reason): Up to mod assist to maintain balance in  sitting. Lower Body Bathing: Maximal assistance, Sitting/lateral leans Upper Body Dressing : Moderate assistance, Sitting Lower Body Dressing: Maximal assistance, Sitting/lateral leans Toilet Transfer: +2 for physical assistance, Maximal assistance, BSC Toilet Transfer Details (indicate cue type and reason): Simulated using Stedy. Attempted sit<>stand with max assist +2 with RW and unable to complete Toileting- Clothing Manipulation and Hygiene: Total assistance, Sit to/from stand General ADL Comments: Pt able to complete simulated toilet transfer wtih Stedy and max assist of 2. Educated pt and son on ADL strategies during recovery.  Cognition: Cognition Overall Cognitive Status: Within Functional Limits for tasks assessed Orientation Level: Oriented X4 Cognition Arousal/Alertness: Awake/alert Behavior During Therapy: WFL for tasks assessed/performed Overall Cognitive Status: Within Functional Limits for tasks assessed General Comments: Cognition in tact for tasks assessed. Pt able to problem solve simple ADL tasks. Will continue to assess.  Blood pressure (!) 165/58, pulse 65, temperature 98.1 F (36.7 C), temperature source Oral, resp. rate 18, height 5\' 7"  (1.702 m), weight (!) 161.8 kg (356 lb 11.3 oz), SpO2 100 %. Physical Exam  Nursing note and vitals reviewed. Constitutional: She is oriented to person, place, and time. She appears well-developed and well-nourished.  HENT:  Head:  Normocephalic and atraumatic.  Mouth/Throat: Oropharynx is clear and moist.  Eyes: Conjunctivae are normal. Pupils are equal, round, and reactive to light. No scleral icterus.  Neck: Normal range of motion. Neck supple.  Cardiovascular: Normal rate and regular rhythm.   No murmur heard. Respiratory: Effort normal and breath sounds normal. No stridor. No respiratory distress. She has no wheezes.  GI: Soft. Bowel sounds are normal. She exhibits no distension. There is no tenderness.  Musculoskeletal: She  exhibits edema. She exhibits no tenderness.  Neurological: She is alert and oriented to person, place, and time.  Mild dysarthria.  Horizontal nystagmus to left field.  Able to follow basic commands without difficulty. Sensation intact to light touch.  Motor: RUE: 5/5 proximal to distal LUE: 4+-5/5 proximal to distal with ataxia B/l LE: HF, KE 4/5, ADF/PF 4+/5 proximal to distal (limited by body habitus) DTRs grossly symmetric (limited by body habitus)  Skin: Skin is warm and dry.  Psychiatric: She has a normal mood and affect. Her behavior is normal. Thought content normal.    Results for orders placed or performed during the hospital encounter of 03/06/16 (from the past 24 hour(s))  Glucose, capillary     Status: Abnormal   Collection Time: 03/07/16  4:43 PM  Result Value Ref Range   Glucose-Capillary 154 (H) 65 - 99 mg/dL  Glucose, capillary     Status: Abnormal   Collection Time: 03/07/16  6:40 PM  Result Value Ref Range   Glucose-Capillary 168 (H) 65 - 99 mg/dL  Glucose, capillary     Status: Abnormal   Collection Time: 03/07/16  9:49 PM  Result Value Ref Range   Glucose-Capillary 105 (H) 65 - 99 mg/dL   Comment 1 Notify RN    Comment 2 Document in Chart   Urine rapid drug screen (hosp performed)not at [facility]     Status: None   Collection Time: 03/07/16 11:12 PM  Result Value Ref Range   Opiates NONE DETECTED NONE DETECTED   Cocaine NONE DETECTED NONE DETECTED   Benzodiazepines NONE DETECTED NONE DETECTED   Amphetamines NONE DETECTED NONE DETECTED   Tetrahydrocannabinol NONE DETECTED NONE DETECTED   Barbiturates NONE DETECTED NONE DETECTED  Urinalysis, Routine w reflex microscopic     Status: Abnormal   Collection Time: 03/07/16 11:13 PM  Result Value Ref Range   Color, Urine YELLOW YELLOW   APPearance CLOUDY (A) CLEAR   Specific Gravity, Urine 1.015 1.005 - 1.030   pH 7.0 5.0 - 8.0   Glucose, UA NEGATIVE NEGATIVE mg/dL   Hgb urine dipstick SMALL (A) NEGATIVE    Bilirubin Urine NEGATIVE NEGATIVE   Ketones, ur NEGATIVE NEGATIVE mg/dL   Protein, ur NEGATIVE NEGATIVE mg/dL   Nitrite POSITIVE (A) NEGATIVE   Leukocytes, UA LARGE (A) NEGATIVE   RBC / HPF 0-5 0 - 5 RBC/hpf   WBC, UA TOO NUMEROUS TO COUNT 0 - 5 WBC/hpf   Bacteria, UA MANY (A) NONE SEEN   Squamous Epithelial / LPF 0-5 (A) NONE SEEN   WBC Clumps PRESENT    Mucous PRESENT    Hyaline Casts, UA PRESENT   Glucose, capillary     Status: Abnormal   Collection Time: 03/08/16  6:03 AM  Result Value Ref Range   Glucose-Capillary 107 (H) 65 - 99 mg/dL   Comment 1 Notify RN    Comment 2 Document in Chart   Glucose, capillary     Status: Abnormal   Collection Time: 03/08/16 12:36 PM  Result Value  Ref Range   Glucose-Capillary 135 (H) 65 - 99 mg/dL   Ct Angio Head W/cm &/or Wo Cm  Result Date: 03/07/2016 CLINICAL DATA:  Follow-up stroke. History of hypertension, hyperlipidemia and diabetes. EXAM: CT ANGIOGRAPHY HEAD AND NECK TECHNIQUE: Multidetector CT imaging of the head and neck was performed using the standard protocol during bolus administration of intravenous contrast. Multiplanar CT image reconstructions and MIPs were obtained to evaluate the vascular anatomy. Carotid stenosis measurements (when applicable) are obtained utilizing NASCET criteria, using the distal internal carotid diameter as the denominator. CONTRAST:  50 cc Isovue 370 COMPARISON:  CT HEAD March 06, 2016 FINDINGS: CTA NECK AORTIC ARCH: Normal appearance of the thoracic arch, normal branch pattern. Mild calcific atherosclerosis of aortic arch. The origins of the innominate, left Common carotid artery and subclavian artery are widely patent. RIGHT CAROTID SYSTEM: Common carotid artery is widely patent, coursing in a straight line fashion. Normal appearance of the carotid bifurcation without hemodynamically significant stenosis by NASCET criteria. Normal appearance of the included internal carotid artery. LEFT CAROTID SYSTEM: Common  carotid artery is widely patent, coursing in a straight line fashion. Normal appearance of the carotid bifurcation without hemodynamically significant stenosis by NASCET criteria. Normal appearance of the included internal carotid artery. VERTEBRAL ARTERIES:Left vertebral artery is dominant. Normal appearance of the vertebral arteries, which appear widely patent. SKELETON: No acute osseous process though bone windows have not been submitted. Multiple bilateral subcentimeter retro pectoral lymph nodes. Patient is edentulous. Moderate degenerative change of the cervical spine. OTHER NECK: Soft tissues of the neck are non-acute though, not tailored for evaluation. Status post median sternotomy. CTA HEAD ANTERIOR CIRCULATION: Normal appearance of the cervical internal carotid arteries, petrous, cavernous and supra clinoid internal carotid arteries. Widely patent anterior communicating artery. Normal appearance of the anterior and middle cerebral arteries. No large vessel occlusion, hemodynamically significant stenosis, dissection, luminal irregularity, contrast extravasation or aneurysm. POSTERIOR CIRCULATION: Moderate focal stenosis LEFT V4 segment due to eccentric calcific atherosclerosis. Normal appearance of the vertebrobasilar junction and basilar artery, as well as main branch vessels with exception of LEFT posterior-inferior cerebellar artery. Normal appearance of the posterior cerebral arteries, mild stenosis RIGHT P1 origin. Small RIGHT posterior communicating artery present. No large vessel occlusion, hemodynamically significant stenosis, dissection, luminal irregularity, contrast extravasation or aneurysm. VENOUS SINUSES: Major dural venous sinuses are patent though not tailored for evaluation on this angiographic examination. ANATOMIC VARIANTS: None. DELAYED PHASE: Not performed. IMPRESSION: CTA NECK: No hemodynamically significant stenosis or acute vascular process in the neck. CTA HEAD: Poor visualization  of LEFT posterior-inferior cerebellar artery, possibly occluded. Moderate focal stenosis LEFT vertebral artery. No emergent large vessel occlusion or severe stenosis. Electronically Signed   By: Awilda Metro M.D.   On: 03/07/2016 01:35   Ct Angio Neck W/cm &/or Wo/cm  Result Date: 03/07/2016 CLINICAL DATA:  Follow-up stroke. History of hypertension, hyperlipidemia and diabetes. EXAM: CT ANGIOGRAPHY HEAD AND NECK TECHNIQUE: Multidetector CT imaging of the head and neck was performed using the standard protocol during bolus administration of intravenous contrast. Multiplanar CT image reconstructions and MIPs were obtained to evaluate the vascular anatomy. Carotid stenosis measurements (when applicable) are obtained utilizing NASCET criteria, using the distal internal carotid diameter as the denominator. CONTRAST:  50 cc Isovue 370 COMPARISON:  CT HEAD March 06, 2016 FINDINGS: CTA NECK AORTIC ARCH: Normal appearance of the thoracic arch, normal branch pattern. Mild calcific atherosclerosis of aortic arch. The origins of the innominate, left Common carotid artery and subclavian artery are widely  patent. RIGHT CAROTID SYSTEM: Common carotid artery is widely patent, coursing in a straight line fashion. Normal appearance of the carotid bifurcation without hemodynamically significant stenosis by NASCET criteria. Normal appearance of the included internal carotid artery. LEFT CAROTID SYSTEM: Common carotid artery is widely patent, coursing in a straight line fashion. Normal appearance of the carotid bifurcation without hemodynamically significant stenosis by NASCET criteria. Normal appearance of the included internal carotid artery. VERTEBRAL ARTERIES:Left vertebral artery is dominant. Normal appearance of the vertebral arteries, which appear widely patent. SKELETON: No acute osseous process though bone windows have not been submitted. Multiple bilateral subcentimeter retro pectoral lymph nodes. Patient is  edentulous. Moderate degenerative change of the cervical spine. OTHER NECK: Soft tissues of the neck are non-acute though, not tailored for evaluation. Status post median sternotomy. CTA HEAD ANTERIOR CIRCULATION: Normal appearance of the cervical internal carotid arteries, petrous, cavernous and supra clinoid internal carotid arteries. Widely patent anterior communicating artery. Normal appearance of the anterior and middle cerebral arteries. No large vessel occlusion, hemodynamically significant stenosis, dissection, luminal irregularity, contrast extravasation or aneurysm. POSTERIOR CIRCULATION: Moderate focal stenosis LEFT V4 segment due to eccentric calcific atherosclerosis. Normal appearance of the vertebrobasilar junction and basilar artery, as well as main branch vessels with exception of LEFT posterior-inferior cerebellar artery. Normal appearance of the posterior cerebral arteries, mild stenosis RIGHT P1 origin. Small RIGHT posterior communicating artery present. No large vessel occlusion, hemodynamically significant stenosis, dissection, luminal irregularity, contrast extravasation or aneurysm. VENOUS SINUSES: Major dural venous sinuses are patent though not tailored for evaluation on this angiographic examination. ANATOMIC VARIANTS: None. DELAYED PHASE: Not performed. IMPRESSION: CTA NECK: No hemodynamically significant stenosis or acute vascular process in the neck. CTA HEAD: Poor visualization of LEFT posterior-inferior cerebellar artery, possibly occluded. Moderate focal stenosis LEFT vertebral artery. No emergent large vessel occlusion or severe stenosis. Electronically Signed   By: Awilda Metro M.D.   On: 03/07/2016 01:35   Mr Brain Wo Contrast  Result Date: 03/07/2016 CLINICAL DATA:  66 year old female code stroke, left side weakness and numbness. Left cerebellar infarct on presentation head CT 03/06/2016. Suspicion of left PICA occlusion on CTA head 0135 hours today. Initial encounter.  EXAM: MRI HEAD WITHOUT CONTRAST TECHNIQUE: Multiplanar, multiecho pulse sequences of the brain and surrounding structures were obtained without intravenous contrast. COMPARISON:  CTA head and neck 0135 hours today. Head CT without contrast 03/06/2016 FINDINGS: Brain: Patchy and confluent restricted diffusion in the inferior left cerebellum and also affecting the left lateral and central medulla (series 3, image 11). Associated T2 and FLAIR hyperintensity. No associated hemorrhage. No significant mass effect. No other restricted diffusion. No midline shift, mass effect, evidence of mass lesion, ventriculomegaly, extra-axial collection or acute intracranial hemorrhage. Cervicomedullary junction and pituitary are within normal limits. Dural calcifications, especially along the superior interhemispheric fissure. Mild for age nonspecific periventricular white matter T2 and FLAIR hyperintensity. No cortical encephalomalacia. No definite chronic cerebral blood products. Cerebral volume is within normal limits for age. Vascular: Major intracranial vascular flow voids are preserved. Suspected thrombus in the left PICA as seen on series 9, image 6 - the same segment which was nonvisualized by CTA. Skull and upper cervical spine: Negative. Normal bone marrow signal. Sinuses/Orbits: Left gaze deviation. Otherwise negative orbit soft tissues. Visualized paranasal sinuses and mastoids are stable and well pneumatized. Other: Visible internal auditory structures appear normal. Negative scalp soft tissues. IMPRESSION: 1. Left PICA occlusion with patchy acute infarcts affecting the left PICA territory of the cerebellum and also the left  lateral and central medulla. No associated hemorrhage or mass effect. 2. No other acute intracranial abnormality and otherwise mild for age nonspecific cerebral white matter signal changes. Electronically Signed   By: Odessa FlemingH  Hall M.D.   On: 03/07/2016 07:15   Dg Swallowing Func-speech  Pathology  Result Date: 03/07/2016 Objective Swallowing Evaluation: Type of Study: MBS-Modified Barium Swallow Study Patient Details Name: Megan NielsenBeverley Grossberg MRN: [id number] Date of Birth: 14-Aug-1950 Today's Date: 03/07/2016 Time: SLP Start Time (ACUTE ONLY): 1425-SLP Stop Time (ACUTE ONLY): 1446 SLP Time Calculation (min) (ACUTE ONLY): 21 min Past Medical History: Past Medical History: Diagnosis Date . CAD (coronary artery disease)   small NSTEMI 11/12: LHC 01/06/11:  LAD 90-95%, mD1 90%, pD2 50%,  pOM1 50%, RCA occluded,  EF > 65%;  CABG  12/12: L-LAD . Diabetes mellitus  . Glucose intolerance (impaired glucose tolerance)   Hemoglobin A1c 6.4 in 12/2010 . HTN (hypertension)   Echo 01/06/11: EF 65% . Hyperlipidemia  . Obesity  Past Surgical History: Past Surgical History: Procedure Laterality Date . ABDOMINAL HYSTERECTOMY   . APPENDECTOMY   . CORONARY ARTERY BYPASS GRAFT  01/12/2011  Procedure: CORONARY ARTERY BYPASS GRAFTING (CABG);  Surgeon: Alleen BorneBryan K [name], MD;  [facility];  Service: Open Heart Surgery;  Laterality: N/A;  coronary artery bypass graft times one using left internal mammary artery . Attempted endoscopic saphenous vein harvest . LEFT HEART CATHETERIZATION WITH CORONARY ANGIOGRAM N/A 01/06/2011  Procedure: LEFT HEART CATHETERIZATION WITH CORONARY ANGIOGRAM;  Surgeon: Rollene RotundaJames Hochrein, MD;  [facility];  Service: Cardiovascular;  Laterality: N/A; HPI: 66 y.o.femalewith medical history significant of coronary artery disease, diabetes, hypertension, hyperlipidemia, obesity, obstructive sleep apnea, presents to the emergency room with chief complaint of sudden onset vertigo and left-sided weakness. MRI showed left PICA occlusion with patchy acute infarcts affecting the left PCA territory of the cerebellum and also the left lateral and central medulla.66 y.o.femalewith medical history significant of coronary artery disease, diabetes, hypertension, hyperlipidemia, obesity, obstructive sleep  apnea, presents to the emergency room with chief complaint of sudden onset vertigo and left-sided weakness. MRI showed left PICA occlusion with patchy acute infarcts affecting the left PCA territory of the cerebellum and also the left lateral and central medulla. Subjective: pt alert, hungry Assessment / Plan / Recommendation CHL IP CLINICAL IMPRESSIONS 03/07/2016 Therapy Diagnosis Mild pharyngeal phase dysphagia Clinical Impression Pt has a mild pharyngeal dysphagia characterized by impaired timing that results in silent penetration before the swallow with thin liquids. Min cues for use of a chin tuck are effective at increasing airway protection. Suspect that delayed throat clearing and coughing at bedside was due to either increased volume or depth of airway invasion. Recommend regular diet textures and thin liquids but with use of chin tuck.  Impact on safety and function Mild aspiration risk   CHL IP TREATMENT RECOMMENDATION 03/07/2016 Treatment Recommendations Therapy as outlined in treatment plan below   Prognosis 03/07/2016 Prognosis for Safe Diet Advancement Good Barriers to Reach Goals -- Barriers/Prognosis Comment -- CHL IP DIET RECOMMENDATION 03/07/2016 SLP Diet Recommendations Regular solids;Thin liquid Liquid Administration via Cup;Straw Medication Administration Whole meds with puree Compensations Slow rate;Small sips/bites;Chin tuck Postural Changes Seated upright at 90 degrees   CHL IP OTHER RECOMMENDATIONS 03/07/2016 Recommended Consults -- Oral Care Recommendations Oral care BID Other Recommendations --   CHL IP FOLLOW UP RECOMMENDATIONS 03/07/2016 Follow up Recommendations (No Data)   CHL IP FREQUENCY AND DURATION 03/07/2016 Speech Therapy Frequency (ACUTE ONLY) min 2x/week Treatment Duration 2 weeks  CHL IP ORAL PHASE 03/07/2016 Oral Phase WFL Oral - Pudding Teaspoon -- Oral - Pudding Cup -- Oral - Honey Teaspoon -- Oral - Honey Cup -- Oral - Nectar Teaspoon -- Oral - Nectar Cup -- Oral - Nectar  Straw -- Oral - Thin Teaspoon -- Oral - Thin Cup -- Oral - Thin Straw -- Oral - Puree -- Oral - Mech Soft -- Oral - Regular -- Oral - Multi-Consistency -- Oral - Pill -- Oral Phase - Comment --  CHL IP PHARYNGEAL PHASE 03/07/2016 Pharyngeal Phase Impaired Pharyngeal- Pudding Teaspoon -- Pharyngeal -- Pharyngeal- Pudding Cup -- Pharyngeal -- Pharyngeal- Honey Teaspoon -- Pharyngeal -- Pharyngeal- Honey Cup -- Pharyngeal -- Pharyngeal- Nectar Teaspoon -- Pharyngeal -- Pharyngeal- Nectar Cup -- Pharyngeal -- Pharyngeal- Nectar Straw -- Pharyngeal -- Pharyngeal- Thin Teaspoon -- Pharyngeal -- Pharyngeal- Thin Cup Delayed swallow initiation-pyriform sinuses;Compensatory strategies attempted (with notebox);Penetration/Aspiration before swallow Pharyngeal Material enters airway, remains ABOVE vocal cords and not ejected out Pharyngeal- Thin Straw Delayed swallow initiation-pyriform sinuses;Compensatory strategies attempted (with notebox) Pharyngeal -- Pharyngeal- Puree WFL Pharyngeal -- Pharyngeal- Mechanical Soft -- Pharyngeal -- Pharyngeal- Regular WFL Pharyngeal -- Pharyngeal- Multi-consistency -- Pharyngeal -- Pharyngeal- Pill -- Pharyngeal -- Pharyngeal Comment --  CHL IP CERVICAL ESOPHAGEAL PHASE 03/07/2016 Cervical Esophageal Phase WFL Pudding Teaspoon -- Pudding Cup -- Honey Teaspoon -- Honey Cup -- Nectar Teaspoon -- Nectar Cup -- Nectar Straw -- Thin Teaspoon -- Thin Cup -- Thin Straw -- Puree -- Mechanical Soft -- Regular -- Multi-consistency -- Pill -- Cervical Esophageal Comment -- No flowsheet data found. Maxcine Ham 03/07/2016, 4:14 PM Maxcine Ham, M.A. CCC-SLP [phone]              Assessment/Plan: Diagnosis:  Left cerebellar infarct Labs and images independently reviewed.  Records reviewed and summated above. Stroke: Continue secondary stroke prophylaxis and Risk Factor Modification listed below:   Antiplatelet therapy:   Blood Pressure Management:  Continue current medication with  prn's with permisive HTN per primary team Statin Agent:   Prediabetes management:    1. Does the need for close, 24 hr/day medical supervision in concert with the patient's rehab needs make it unreasonable for this patient to be served in a less intensive setting? Yes  2. Co-Morbidities requiring supervision/potential complications: CAD (cont meds), super obesity (Body mass index is 55.87 kg/m., diet and exercise education, encourage weight loss to increase endurance and promote overall health), calcified aortic valve, diastolic dysfunction (monitor for signs/symptoms of fluid overload), HTN (monitor and provide prns in accordance with increased physical exertion and pain), prediabetes (Monitor in accordance with exercise and adjust meds as necessary) 3. Due to safety, disease management and patient education, does the patient require 24 hr/day rehab nursing? Potentially 4. Does the patient require coordinated care of a physician, rehab nurse, PT (1-2 hrs/day, 5 days/week) and OT (1-2 hrs/day, 5 days/week) to address physical and functional deficits in the context of the above medical diagnosis(es)? Yes Addressing deficits in the following areas: balance, endurance, locomotion, strength, transferring, dressing, toileting and psychosocial support 5. Can the patient actively participate in an intensive therapy program of at least 3 hrs of therapy per day at least 5 days per week? Potentially 6. The potential for patient to make measurable gains while on inpatient rehab is excellent 7. Anticipated functional outcomes upon discharge from inpatient rehab are supervision and min assist  with PT, min assist with OT, n/a with SLP. 8. Estimated rehab length of stay to reach the above functional goals is:  13-17 days. 9. Does the patient have adequate social supports and living environment to accommodate these discharge functional goals? Potentially 10. Anticipated D/C setting: Home 11. Anticipated post D/C  treatments: HH therapy and Home excercise program 12. Overall Rehab/Functional Prognosis: good  RECOMMENDATIONS: This patient's condition is appropriate for continued rehabilitative care in the following setting: CIR if adequate caregiver support available upon discharge.  Patient has agreed to participate in recommended program. Yes Note that insurance prior authorization may be required for reimbursement for recommended care.  Comment: Rehab Admissions Coordinator to follow up.  Maryla Morrow, MD, Earlie Counts, [place] 03/08/2016

## 2016-03-08 NOTE — Progress Notes (Signed)
Inpatient Rehabilitation  PT and OT have evaluated pt. And are recommending IP Rehab.  Patient was screened by Weldon PickingSusan Zebbie Ace for appropriateness for an Inpatient Acute Rehab consult.  At this time, we are recommending Inpatient Rehab consult.  Please order consult if you are agreeable.  Weldon PickingSusan Keyarah Mcroy PT Inpatient Rehab Admissions Coordinator Cell [phone] Office [phone]

## 2016-03-08 NOTE — Progress Notes (Signed)
  Echocardiogram 2D Echocardiogram with Definity has been performed.  Cathie BeamsGREGORY, Rose-Marie Hickling 03/08/2016, 12:28 PM

## 2016-03-08 NOTE — Progress Notes (Signed)
Physical Therapy Treatment Patient Details Name: Megan Atkinson MRN: [id number] DOB: 1950-12-11 Today's Date: 03/08/2016    History of Present Illness Pt is a 66 y.o. female who presents to ED with sudden vertigo and L sided weakness. CT revealed moderate area of inferior L cerebral infarction favored subacute and MRI revealed L PICA occlusion with patchy acute infarcts affecting L PICA territory of the cerebellum and L lateral and central medulla. PMH: coronary artery disease, diabetes, hypertension, hyperlipidemia, obesity, and obstructive sleep apnea.    PT Comments    Pt required less assistance with standing with stedy. Progressed pt to transfer with bariatric RW to work towards goals. She required sitting rest break from first standing trial due to fear of falling. She was able to perform second trial of standing, progressing into ambulation but continued to express her fears verbally. Pt needed cuing to correct heavy lean to left during ambulation but unable to correct appropriately. Pt will benefit from CIR to address weakness and improved mobility prior to going home.    Follow Up Recommendations  CIR     Equipment Recommendations  Other (comment) (TBA)    Recommendations for Other Services Rehab consult     Precautions / Restrictions Precautions Precautions: Fall Precaution Comments: L heavy pusher, L weakness Restrictions Weight Bearing Restrictions: No    Mobility  Bed Mobility Overal bed mobility: Needs Assistance Bed Mobility: Supine to Sit     Supine to sit: Min assist     General bed mobility comments: min A + heavy use of rails to get to EOB.  Cuing for hand placement during turn to EOB and to scoot forward.  Transfers Overall transfer level: Needs assistance Equipment used: Rolling walker (2 wheeled) (Stedy)   Sit to Stand: +2 physical assistance         General transfer comment: Pt able to perform 3 trials from normal height with Mod A +2 and 2  trials from elevated surface in stedy with Mod A to power up. Pt needed cuing for hand placement.  Ambulation/Gait Ambulation/Gait assistance: Mod assist;+2 physical assistance   Assistive device: Rolling walker (2 wheeled) Gait Pattern/deviations: Trunk flexed;Decreased weight shift to right;Decreased step length - right;Decreased step length - left     General Gait Details: 5-6 steps; pt presents with fear of falling, limiting her abilities to advance further. Cuing for upright posture and step advancement.    Stairs            Wheelchair Mobility    Modified Rankin (Stroke Patients Only) Modified Rankin (Stroke Patients Only) Pre-Morbid Rankin Score: No symptoms Modified Rankin: Moderately severe disability     Balance Overall balance assessment: Needs assistance   Sitting balance-Leahy Scale: Fair Sitting balance - Comments: able to sit EOB with minimal UE support   Standing balance support: Bilateral upper extremity supported;During functional activity Standing balance-Leahy Scale: Poor Standing balance comment: mod A of 2 in standing with walker, heavy lean to L                    Cognition Arousal/Alertness: Awake/alert Behavior During Therapy: WFL for tasks assessed/performed Overall Cognitive Status: Within Functional Limits for tasks assessed                 General Comments: Pt able to engage and follow commands with various tasks performed.     Exercises General Exercises - Lower Extremity Quad Sets: Strengthening;Both;10 reps;Supine Gluteal Sets: Supine;Strengthening;10 reps Long Arc Quad: Strengthening;Seated;Both;10 reps Heel Slides:  AROM;10 reps;Both;Supine    General Comments        Pertinent Vitals/Pain Pain Assessment: No/denies pain    Home Living     Available Help at Discharge: Family;Available 24 hours/day;Available PRN/intermittently;Friend(s) Type of Home: House              Prior Function            PT  Goals (current goals can now be found in the care plan section) Acute Rehab PT Goals Patient Stated Goal: to get better PT Goal Formulation: With patient Potential to Achieve Goals: Good Progress towards PT goals: Progressing toward goals    Frequency    Min 4X/week      PT Plan Current plan remains appropriate    Co-evaluation             End of Session Equipment Utilized During Treatment: Gait belt Activity Tolerance: Patient tolerated treatment well;Other (comment) (pt limited by fear of falling) Patient left: in bed;with call bell/phone within reach     Time: [id number] PT Time Calculation (min) (ACUTE ONLY): 18 min  Charges:  $Therapeutic Activity: 8-22 mins                    G Codes:      [facility] CenterMary Moody Ellin Fitzgibbons 03/08/2016, 5:33 PM Kerrin MoMary M Ericia Moxley, [place] Pager [phone]

## 2016-03-09 DIAGNOSIS — G4733 Obstructive sleep apnea (adult) (pediatric): Secondary | ICD-10-CM

## 2016-03-09 LAB — GLUCOSE, CAPILLARY
GLUCOSE-CAPILLARY: 109 mg/dL — AB (ref 65–99)
GLUCOSE-CAPILLARY: 107 mg/dL — AB (ref 65–99)
GLUCOSE-CAPILLARY: 139 mg/dL — AB (ref 65–99)
Glucose-Capillary: 110 mg/dL — ABNORMAL HIGH (ref 65–99)

## 2016-03-09 MED ORDER — MAGNESIUM CITRATE PO SOLN
1.0000 | Freq: Once | ORAL | Status: AC
  Administered 2016-03-09: 1 via ORAL
  Filled 2016-03-09: qty 296

## 2016-03-09 NOTE — [place] FL2 (Signed)
MEDICAID FL2 LEVEL OF CARE SCREENING TOOL     IDENTIFICATION  Patient Name: Jozelynn Danielson Birthdate: 1950/11/06 Sex: female Admission Date (Current Location): 03/06/2016  [facility] [place] Number:  Producer, television/film/video and Address:  [facility], [address] [place], [place] [address]      Provider Number: [id number]  Attending Physician Name and Address:  Jerald Kief, MD  Relative Name and Phone Number:       Current Level of Care: Hospital Recommended Level of Care: Skilled Nursing Facility Prior Approval Number:    Date Approved/Denied:   PASRR Number: [id number]  Discharge Plan: SNF    Current Diagnoses: Patient Active Problem List   Diagnosis Date Noted  . Super obese (HCC)   . Prediabetes   . Ataxia, post-stroke   . Acute cystitis without hematuria   . CVA (cerebral vascular accident) (HCC) 03/06/2016  . Morbid obesity (HCC) 02/29/2016  . OSA (obstructive sleep apnea) 11/24/2014  . Dyspnea 10/02/2014  . Anemia 04/05/2011  . Urinary tract infection, site not specified 04/05/2011  . Diabetes mellitus (HCC) 02/22/2011  . Hyperlipidemia 02/22/2011  . CAD (coronary artery disease) 01/07/2011  . Hypertension 01/07/2011    Orientation RESPIRATION BLADDER Height & Weight     Self, Time, Situation, Place  Normal Continent Weight: (!) 356 lb 11.3 oz (161.8 kg) Height:  5\' 7"  (170.2 cm)  BEHAVIORAL SYMPTOMS/MOOD NEUROLOGICAL BOWEL NUTRITION STATUS   (None)  (CVA) Continent Diet (Carb modified. Meds whole in puree. Chin tuck with all liquids.)  AMBULATORY STATUS COMMUNICATION OF NEEDS Skin   Limited Assist Verbally Normal                       Personal Care Assistance Level of Assistance  Bathing, Feeding, Dressing Bathing Assistance: Maximum assistance Feeding assistance: Limited assistance Dressing Assistance: Maximum assistance     Functional Limitations Info  Sight, Hearing, Speech Sight Info:  Adequate Hearing Info: Adequate Speech Info: Adequate    SPECIAL CARE FACTORS FREQUENCY  PT (By licensed PT), Blood pressure, OT (By licensed OT), Speech therapy     PT Frequency: 5 x week OT Frequency: 5 x week     Speech Therapy Frequency: 2 x week      Contractures Contractures Info: Not present    Additional Factors Info  Allergies, Code Status Code Status Info: Full Allergies Info: Lisinopril, Metformin and related,            Current Medications (03/09/2016):  This is the current hospital active medication list Current Facility-Administered Medications  Medication Dose Route Frequency Provider Last Rate Last Dose  . acetaminophen (TYLENOL) tablet 650 mg  650 mg Oral Q4H PRN Costin Otelia Sergeant, MD       Or  . acetaminophen (TYLENOL) solution 650 mg  650 mg Per Tube Q4H PRN Costin Otelia Sergeant, MD       Or  . acetaminophen (TYLENOL) suppository 650 mg  650 mg Rectal Q4H PRN Costin Otelia Sergeant, MD      . aspirin EC tablet 325 mg  325 mg Oral Daily Marvel Plan, MD   325 mg at 03/09/16 1053  . atorvastatin (LIPITOR) tablet 80 mg  80 mg Oral q1800 Marvel Plan, MD   80 mg at 03/08/16 1713  . clopidogrel (PLAVIX) tablet 75 mg  75 mg Oral Daily Marvel Plan, MD   75 mg at 03/09/16 1053  . enoxaparin (LOVENOX) injection 40 mg  40 mg Subcutaneous Q24H Leatha Gildingostin M Gherghe, MD   40 mg at 03/08/16 2131  . insulin aspart (novoLOG) injection 0-9 Units  0-9 Units Subcutaneous TID WC Costin Otelia SergeantM Gherghe, MD   1 Units at 03/08/16 1251  . ondansetron (ZOFRAN) injection 4 mg  4 mg Intravenous Q8H PRN Leatha Gildingostin M Gherghe, MD   4 mg at 03/06/16 2259     Discharge Medications: Please see discharge summary for a list of discharge medications.  Relevant Imaging Results:  Relevant Lab Results:   Additional Information SS#: [id number]  Margarito LinerSarah C Shantele Reller, LCSW

## 2016-03-09 NOTE — H&P (Signed)
Physical Medicine and Rehabilitation Admission H&P    Chief Complaint  Patient presents with  . Left sided weakness and dizziness.   HPI: Megan Atkinson is a 66 y.o. female with history of CAD, HTN, morbid obesity who was admitted on 03/06/16 with vertigo and left sided weakness with numbness. CT head reviewed, showing left cerebellar infarct, thought to be subacute. TPA not administered. CTA head/neck no significant stenosis in neck and moderate focal stenosis left VA.  2D echo with eF 60-65% with mild to moderately calcified aortic valve and grade I diastolic dysfunction. BLE dopplers negative for DVT. Dr. Roda Shutters felt that stroke due to atherosclerosis and multiple risk factors.  He recommended ASA/Plavix for three months followed by plavix alone.  MBS done 1/30 revealing mild pharyngeal dysphagia with penetration of thin therefore chin tuck with thin liquids recommended. Swallow re-eval done today showing improvement and no need for chin tuck.  Patient with resultant visual deficits with vestibular issues, difficulty with weight shifting, stooped posture and left lateral lean with ambulation. Therapy ongoing and  CIR recommended for follow up therapy.    Review of Systems  HENT: Negative for ear discharge, hearing loss and tinnitus.   Eyes: Positive for blurred vision (vision improving in left eye). Negative for double vision and photophobia.  Respiratory: Negative for cough, shortness of breath and stridor.   Cardiovascular: Positive for leg swelling. Negative for chest pain and palpitations.  Gastrointestinal: Negative for abdominal pain, constipation, heartburn and nausea.  Genitourinary: Negative for dysuria, frequency and urgency.  Musculoskeletal: Positive for joint pain (occasional knee pain). Negative for myalgias and neck pain.  Skin: Negative for rash.  Neurological: Positive for focal weakness and weakness. Negative for dizziness and headaches.  Psychiatric/Behavioral: Negative  for hallucinations. The patient is not nervous/anxious and does not have insomnia.       Past Medical History:  Diagnosis Date  . CAD (coronary artery disease)    small NSTEMI 11/12: LHC 01/06/11:  LAD 90-95%, mD1 90%, pD2 50%,  pOM1 50%, RCA occluded,  EF > 65%;  CABG  12/12: L-LAD  . Diabetes mellitus   . Glucose intolerance (impaired glucose tolerance)    Hemoglobin A1c 6.4 in 12/2010  . HTN (hypertension)    Echo 01/06/11: EF 65%  . Hyperlipidemia   . Obesity     Past Surgical History:  Procedure Laterality Date  . ABDOMINAL HYSTERECTOMY    . APPENDECTOMY    . CORONARY ARTERY BYPASS GRAFT  01/12/2011   Procedure: CORONARY ARTERY BYPASS GRAFTING (CABG);  Surgeon: Alleen Borne, MD;  Location: [facility] OR;  Service: Open Heart Surgery;  Laterality: N/A;  coronary artery bypass graft times one using left internal mammary artery . Attempted endoscopic saphenous vein harvest  . LEFT HEART CATHETERIZATION WITH CORONARY ANGIOGRAM N/A 01/06/2011   Procedure: LEFT HEART CATHETERIZATION WITH CORONARY ANGIOGRAM;  Surgeon: Rollene Rotunda, MD;  Location[facility] CATH LAB;  Service: Cardiovascular;  Laterality: N/A;    Family History  Problem Relation Age of Onset  . Coronary artery disease      No family history    Social History:  Married. Husband with multiple medical issues--can provide supervision after discharge. 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 She  reports that she has never smoked. She has never used smokeless tobacco. She reports that she drinks alcohol on rare occasions. She reports that she does not use drugs.    Allergies  Allergen Reactions  .  Lisinopril     cough  . Metformin And Related Diarrhea    A lot of fluid loss from bowel and diarrhea    Medications Prior to Admission  Medication Sig Dispense Refill  . aspirin 325 MG tablet Take 325 mg by mouth daily.      . Calcium Carbonate-Vitamin D (CALCIUM-VITAMIN D) 500-200 MG-UNIT tablet  Take 1 tablet by mouth daily.    Marland Kitchen glucosamine-chondroitin 500-400 MG tablet Take 1 tablet by mouth daily.      . metoprolol (LOPRESSOR) 50 MG tablet TAKE ONE TABLET BY MOUTH TWICE DAILY 60 tablet 2  . pravastatin (PRAVACHOL) 80 MG tablet TAKE ONE TABLET BY MOUTH ONCE DAILY 90 tablet 2  . Cyanocobalamin (VITAMIN B-12 IJ) weekly    . furosemide (LASIX) 20 MG tablet TAKE ONE TABLET BY MOUTH ONCE DAILY (Patient not taking: Reported on 03/06/2016) 30 tablet 0  . Ginkgo 60 MG TABS Take 1 tablet by mouth daily. For memory     . Green Tea, Camillia sinensis, 1000 MG TABS Take 1 tablet by mouth daily.       Home: Home Living Family/patient expects to be discharged to:: Inpatient rehab Living Arrangements: Alone Available Help at Discharge: Family, Available 24 hours/day, Available PRN/intermittently, Friend(s) Type of Home: House Home Access: Stairs to enter [employer] of Steps: 6 (but reports level entry at front Home Layout: Two level, 1/2 bath on main level (plans to stay on main level) Bathroom Shower/Tub: Engineer, manufacturing systems: Standard Home Equipment: Environmental consultant - 2 wheels  Lives With: Alone   Functional History: Prior Function Level of Independence: Independent Comments: Has walker but does not use this. 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  Functional Status:  Mobility: Bed Mobility Overal bed mobility: Needs Assistance Bed Mobility: Supine to Sit Supine to sit: Min assist General bed mobility comments: pt in chair upon arrival Transfers Overall transfer level: Needs assistance Equipment used: Rolling walker (2 wheeled) Transfer via Lift Equipment: Stedy Transfers: Sit to/from Stand Sit to Stand: +2 physical assistance, Mod assist General transfer comment: Pt required cuing for proper hand placement and mod A +2 for boost into standing from chair x2. Ambulation/Gait Ambulation/Gait assistance: Mod assist, +2 physical assistance (3rd  person for w/c follow) Ambulation Distance (Feet): 8 Feet Assistive device: Rolling walker (2 wheeled) Gait Pattern/deviations: Trunk flexed, Decreased weight shift to right, Decreased step length - right, Decreased step length - left General Gait Details: Pt required two trials due to first attempt being limited by fear and pt requesting to sit down. Second attempt, pt was able to walk about 10 feet with mod A +3 for w/c follow and patient/ staff safety. Pt unable to maintain upright posture without cuing and assistance.; mod A +2 to assist compenstation of left lateral lean.  Gait velocity: slow, fearful    ADL: ADL Overall ADL's : Needs assistance/impaired Eating/Feeding: Minimal assistance, Sitting Grooming: Minimal assistance, Standing Grooming Details (indicate cue type and reason): using STEDY at sink Upper Body Bathing: Moderate assistance, Sitting Upper Body Bathing Details (indicate cue type and reason): Up to mod assist to maintain balance in sitting. Lower Body Bathing: Maximal assistance, Sit to/from stand Lower Body Bathing Details (indicate cue type and reason): sit to/from stand using STEDY Upper Body Dressing : Moderate assistance, Sitting Upper Body Dressing Details (indicate cue type and reason): Up to mod assist to maintain balance in sitting. Lower Body Dressing: Maximal assistance, Sit to/from stand Lower Body Dressing  Details (indicate cue type and reason): sit to/from stand using STEDY Toilet Transfer: +2 for physical assistance, Maximal assistance, BSC, RW Toilet Transfer Details (indicate cue type and reason): Simulated. Pt performed sit to/from stand from recliner with mod assist +2.  Toileting- Clothing Manipulation and Hygiene: Total assistance, Sit to/from stand Tub/Shower Transfer Details (indicate cue type and reason): Did not occur.  General ADL Comments: Pt able to complete simulated toilet transfer wtih Stedy and max assist of 2. Educated pt and son on ADL  strategies during recovery.  Cognition: Cognition Overall Cognitive Status: Within Functional Limits for tasks assessed Arousal/Alertness: Awake/alert Orientation Level: Oriented X4 Memory: Appears intact Awareness: Appears intact Problem Solving: Appears intact Safety/Judgment: Appears intact Cognition Arousal/Alertness: Awake/alert Behavior During Therapy: WFL for tasks assessed/performed Overall Cognitive Status: Within Functional Limits for tasks assessed General Comments: Pt able to engage and follow commands with various tasks performed.    Blood pressure (!) 158/56, pulse 72, temperature 97.7 F (36.5 C), temperature source Oral, resp. rate 18, height 5\' 7"  (1.702 m), weight (!) 161.8 kg (356 lb 11.3 oz), SpO2 99 %. Physical Exam  Nursing note and vitals reviewed. Constitutional: She is oriented to person, place, and time. She appears well-developed and well-nourished.  Morbidly obese female. Strong odor of urine in the room.   HENT:  Head: Normocephalic and atraumatic.  Mouth/Throat: Oropharynx is clear and moist.  Eyes: Conjunctivae are normal. Pupils are equal, round, and reactive to light.  Neck: Normal range of motion. Neck supple.  Cardiovascular: Normal rate and regular rhythm.  Exam reveals no friction rub.   Murmur heard. Respiratory: Effort normal and breath sounds normal. No stridor. No respiratory distress. She has no wheezes. She has no rales. She exhibits no tenderness.  GI: Soft. Bowel sounds are normal. She exhibits no distension. There is no tenderness.  Musculoskeletal: She exhibits edema (1+ pedal edema on left).  Neurological: She is alert and oriented to person, place, and time.  Voice appears stronger today. Able to follow commands without difficulty. Few beats horizontal nystagmus to the left. Decrease in fine motor control LUE.  Sensation intact to light touch.  Motor: RUE and RLE grossly 5/5 prox to distal LUE: 4+/5 proximal to distal with limb  ataxia LLE: HF, KE 4/5, ADF/PF 4+/5 proximal to distal (somehat limited by body habitus) DTRs grossly 1+ bilaterally   Skin: Skin is warm and dry.  Psychiatric: She has a normal mood and affect. Her behavior is normal. Judgment and thought content normal.    Results for orders placed or performed during the hospital encounter of 03/06/16 (from the past 48 hour(s))  Glucose, capillary     Status: None   Collection Time: 03/08/16  4:51 PM  Result Value Ref Range   Glucose-Capillary 99 65 - 99 mg/dL  Glucose, capillary     Status: Abnormal   Collection Time: 03/08/16  9:13 PM  Result Value Ref Range   Glucose-Capillary 117 (H) 65 - 99 mg/dL   Comment 1 Notify RN    Comment 2 Document in Chart   Glucose, capillary     Status: Abnormal   Collection Time: 03/09/16  6:27 AM  Result Value Ref Range   Glucose-Capillary 109 (H) 65 - 99 mg/dL   Comment 1 Notify RN    Comment 2 Document in Chart   Glucose, capillary     Status: Abnormal   Collection Time: 03/09/16 11:15 AM  Result Value Ref Range   Glucose-Capillary 107 (H) 65 -  99 mg/dL  Glucose, capillary     Status: Abnormal   Collection Time: 03/09/16  4:38 PM  Result Value Ref Range   Glucose-Capillary 139 (H) 65 - 99 mg/dL  Glucose, capillary     Status: Abnormal   Collection Time: 03/09/16  9:09 PM  Result Value Ref Range   Glucose-Capillary 110 (H) 65 - 99 mg/dL  Glucose, capillary     Status: None   Collection Time: 03/10/16  6:29 AM  Result Value Ref Range   Glucose-Capillary 77 65 - 99 mg/dL   Comment 1 Notify RN    Comment 2 Document in Chart   Glucose, capillary     Status: Abnormal   Collection Time: 03/10/16  8:59 AM  Result Value Ref Range   Glucose-Capillary 131 (H) 65 - 99 mg/dL  Glucose, capillary     Status: Abnormal   Collection Time: 03/10/16 11:48 AM  Result Value Ref Range   Glucose-Capillary 102 (H) 65 - 99 mg/dL   No results found.     Medical Problem List and Plan: 1.  Ataxia and functional  deficits secondary to left cerebellar infarct  -admit to inpatient rehhab 2.  DVT Prophylaxis/Anticoagulation: Pharmaceutical: Lovenox 3. Pain Management: N/A 4. Mood: Team to provide ego support. LCSW to follow for evaluation and support.  5. Neuropsych: This patient is capable of making decisions on her own behalf. 6. Skin/Wound Care: Routine pressure relief measures.  7. Fluids/Electrolytes/Nutrition: Monitor I/O. Check lytes in am.  8. HTN: Monitor BP bid. Permissive HTN for 5-7 days.--resume Lasix/metoprolol as indicated.  9. OSA: Continue to encourage consistent use of CPAP 10. Morbid obesity: BMI 55.87. Will consult dietician to educate on CM/Heart healthy diet. Encourage appropriate diet and exercise to help promote weight loss to help promote health,  11.  Pyuria:  Will check urine culture and start septra DS.   12. Prediabetes: Hgb A1C-6.3. CM diet reinforced. Monitor BS ac/hs for trends.  13.  CAD s/p CABG: On ASA and Lipitor    Post Admission Physician Evaluation: 1. Functional deficits secondary  to left cerebellar infarct. 2. Patient is admitted to receive collaborative, interdisciplinary care between the physiatrist, rehab nursing staff, and therapy team. 3. Patient's level of medical complexity and substantial therapy needs in context of that medical necessity cannot be provided at a lesser intensity of care such as a SNF. 4. Patient has experienced substantial functional loss from his/her baseline which was documented above under the "Functional History" and "Functional Status" headings.  Judging by the patient's diagnosis, physical exam, and functional history, the patient has potential for functional progress which will result in measurable gains while on inpatient rehab.  These gains will be of substantial and practical use upon discharge  in facilitating mobility and self-care at the household level. 5. Physiatrist will provide 24 hour management of medical needs as well as  oversight of the therapy plan/treatment and provide guidance as appropriate regarding the interaction of the two. 6. The Preadmission Screening has been reviewed and patient status is unchanged unless otherwise stated above. 7. 24 hour rehab nursing will assist with bladder management, bowel management, safety, skin/wound care, disease management, medication administration, pain management and patient education  and help integrate therapy concepts, techniques,education, etc. 8. PT will assess and treat for/with: Lower extremity strength, range of motion, stamina, balance, functional mobility, safety, adaptive techniques and equipment, NMR, vestibular assessment and rx.   Goals are: supervision. 9. OT will assess and treat for/with: ADL's, functional mobility, safety,  upper extremity strength, adaptive techniques and equipment, NMR, education.   Goals are: supervision to min assist. Therapy may proceed with showering this patient. 10. SLP will assess and treat for/with: speech, swallowing, education.  Goals are: mod I. 11. Case Management and Social Worker will assess and treat for psychological issues and discharge planning. 12. Team conference will be held weekly to assess progress toward goals and to determine barriers to discharge. 13. Patient will receive at least 3 hours of therapy per day at least 5 days per week. 14. ELOS: 17-20 days       15. Prognosis:  excellent     Ranelle Oyster, MD, [facility] Physical Medicine & Rehabilitation 03/10/2016  Jacquelynn Cree, Cordelia Poche 03/10/2016

## 2016-03-09 NOTE — Progress Notes (Signed)
Physical Therapy Treatment Patient Details Name: Megan Atkinson MRN: [id number] DOB: 1950/04/22 Today's Date: 03/09/2016    History of Present Illness Pt is a 66 y.o. female who presents to ED with sudden vertigo and L sided weakness. CT revealed moderate area of inferior L cerebral infarction favored subacute and MRI revealed L PICA occlusion with patchy acute infarcts affecting L PICA territory of the cerebellum and L lateral and central medulla. PMH: coronary artery disease, diabetes, hypertension, hyperlipidemia, obesity, and obstructive sleep apnea.    PT Comments    Pt continues to make slow progression towards goals but treatment remains limited by fear during ambulation; she verbally stating how terrified she is to fall. She presents with heavy L lateral lean and requires cuing for gait sequencing and upright posture to become more safe. Pt will benefit from CIR prior to discharge to continue to improve strength and safety during ambulation and mobility prior to going home.    Follow Up Recommendations  CIR     Equipment Recommendations  Other (comment) (TBA)    Recommendations for Other Services Rehab consult     Precautions / Restrictions Precautions Precautions: Fall Precaution Comments: L heavy pusher, L weakness Restrictions Weight Bearing Restrictions: No    Mobility  Bed Mobility               General bed mobility comments: pt in chair upon arrival  Transfers Overall transfer level: Needs assistance Equipment used: Rolling walker (2 wheeled) Transfers: Sit to/from Stand Sit to Stand: +2 physical assistance;Mod assist         General transfer comment: Pt required cuing for proper hand placement and mod A +2 for boost into standing from chair x2.  Ambulation/Gait Ambulation/Gait assistance: Mod assist;+2 physical assistance (3rd person for w/c follow) Ambulation Distance (Feet): 8 Feet Assistive device: Rolling walker (2 wheeled) Gait  Pattern/deviations: Trunk flexed;Decreased weight shift to right;Decreased step length - right;Decreased step length - left Gait velocity: slow, fearful   General Gait Details: Pt required two trials due to first attempt being limited by fear and pt requesting to sit down. Second attempt, pt was able to walk about 10 feet with mod A +3 for w/c follow and patient/ staff safety. Pt unable to maintain upright posture without cuing and assistance.; mod A +2 to assist compenstation of left lateral lean.    Stairs            Wheelchair Mobility    Modified Rankin (Stroke Patients Only) Modified Rankin (Stroke Patients Only) Pre-Morbid Rankin Score: No symptoms Modified Rankin: Moderately severe disability     Balance Overall balance assessment: Needs assistance   Sitting balance-Leahy Scale: Fair Sitting balance - Comments: able to sit EOB with no UE support     Standing balance-Leahy Scale: Poor Standing balance comment: mod A of 2 in standing with walker, heavy lean to L                    Cognition Arousal/Alertness: Awake/alert Behavior During Therapy: WFL for tasks assessed/performed Overall Cognitive Status: Within Functional Limits for tasks assessed                      Exercises General Exercises - Lower Extremity Quad Sets: Strengthening;Both;10 reps;Seated Gluteal Sets: Strengthening;10 reps;Seated Long Arc Quad: Strengthening;Seated;Both;10 reps    General Comments        Pertinent Vitals/Pain Pain Assessment: No/denies pain    Home Living  Prior Function            PT Goals (current goals can now be found in the care plan section) Acute Rehab PT Goals Patient Stated Goal: to get better PT Goal Formulation: With patient Potential to Achieve Goals: Good Progress towards PT goals: Progressing toward goals    Frequency    Min 4X/week      PT Plan Current plan remains appropriate     Co-evaluation             End of Session Equipment Utilized During Treatment: Gait belt Activity Tolerance: Patient tolerated treatment well;Other (comment) (pt limited by fear of falling during ambulation.) Patient left: with call bell/phone within reach;in chair;with nursing/sitter in room     Time: 1026-1050 PT Time Calculation (min) (ACUTE ONLY): 24 min  Charges:  $Gait Training: 8-22 mins $Therapeutic Activity: 8-22 mins                    G Codes:      [facility] CenterMary Moody Faaris Arizpe 03/09/2016, 11:53 AM  Kerrin MoMary M Andreka Stucki, SPTA Pager [phone]

## 2016-03-09 NOTE — Clinical Social Work Note (Signed)
Clinical Social Work Assessment  Patient Details  Name: Megan Atkinson MRN: [id number] Date of Birth: 1950-11-13  Date of referral:  03/09/16               Reason for consult:  Facility Placement, Discharge Planning                Permission sought to share information with:  Facility Sport and exercise psychologist, Family Supports Permission granted to share information::  Yes, Verbal Permission Granted  Name::     [place]::  SNF's  Relationship::  Son  Contact Information:  [phone]  Housing/Transportation Living arrangements for the past 2 months:  Single Family Home Source of Information:  Patient, Medical Team Patient Interpreter Needed:  None Criminal Activity/Legal Involvement Pertinent to Current Situation/Hospitalization:  No - Comment as needed Significant Relationships:  Adult Children, Neighbor Lives with:  Self Do you feel safe going back to the place where you live?  Yes Need for family participation in patient care:  Yes (Comment)  Care giving concerns:  PT recommending CIR when stable for discharge. CSW initiating SNF backup.   Social Worker assessment / plan:  CSW met with patient. No supports at bedside. CSW introduced role and explained that PT recommendations would be discussed. Patient's first preference is CIR but she understands need for backup plan in the event she is not able to go there. No further concerns. CSW encouraged patient to contact CSW as needed. CSW will continue to follow patient for support and facilitate discharge to SNF once medically stable, if needed.  Employment status:  Retired Nurse, adult PT Recommendations:  Inpatient [place] / Referral to community resources:  [place]  Patient/Family's Response to care:  Patient prefers to go to [facility] but is agreeable to a SNF backup. Patient's son supportive and involved in patient's care. Patient appreciated social work  intervention.  Patient/Family's Understanding of and Emotional Response to Diagnosis, Current Treatment, and Prognosis:  Patient appears to have a good understanding of the reason for her admission. Patient appears happy with hospital care.  Emotional Assessment Appearance:  Appears stated age Attitude/Demeanor/Rapport:  Other (Pleasant) Affect (typically observed):  Accepting, Appropriate, Calm, Pleasant Orientation:  Oriented to Self, Oriented to Place, Oriented to  Time, Oriented to Situation Alcohol / Substance use:  Never Used Psych involvement (Current and /or in the community):  No (Comment)  Discharge Needs  Concerns to be addressed:  Care Coordination Readmission within the last 30 days:  No Current discharge risk:  Dependent with Mobility, Lives alone Barriers to Discharge:  Ship broker, Continued Medical Work up   Candie Chroman, LCSW 03/09/2016, 12:04 PM

## 2016-03-09 NOTE — Progress Notes (Signed)
PROGRESS NOTE    Tuesday Terlecki  UEA:[id number] DOB: 14-Jul-1950 DOA: 03/06/2016 PCP: Sanda Linger, MD    Brief Narrative:  66 y.o.femalewith medical history significant of coronary artery disease, diabetes, hypertension, hyperlipidemia, obesity, obstructive sleep apnea, presents to the emergency room with chief complaint of sudden onset vertigo and left-sided weakness early on the morning of presentation  Assessment & Plan:   Active Problems:   CAD (coronary artery disease)   Hypertension   Diabetes mellitus (HCC)   Hyperlipidemia   OSA (obstructive sleep apnea)   Morbid obesity (HCC)   CVA (cerebral vascular accident) (HCC)   Acute cystitis without hematuria   Super obese (HCC)   Prediabetes   Ataxia, post-stroke   Acute CVA - Patient underwent an MRI which showed patchy acute infarcts in the left PICA territory of the cerebellum, as well as left lateral and central medulla. She also has left PICA occlusion.  - Neurology consulted and is following -  complete workup with 2-D echo, lipid panel, hemoglobin A1c, PT/OT/SLP.  - She failed swallow evaluation at bedside in the emergency room - Therapy recommendations for CIR. Have consulted CIR - Awaiting placement  Hypertension  - allow permissive hypertension - BP remains stable this afternoon  Diabetes mellitus  - doesn't appear to be on medications, may be diet controlled, continue on sliding scale - hgb a1c of 6.3  Hyperlipidemia - continue statin - Stable at present  Obstructive sleep apnea - CPAP daily at bedtime - Presently stable  CAD - Without chest pain, on aspirin.  - Stable at present - No wall motion abnormality on echo, reviewed   DVT prophylaxis: Lovenox subQ Code Status: Full Family Communication: Pt in room, family at bedside Disposition Plan: Possible CIR, timing uncertain  Consultants:   CIR  neurology  Procedures:     Antimicrobials: Anti-infectives    None       Subjective: Without complaints  Objective: Vitals:   03/09/16 0507 03/09/16 1011 03/09/16 1418 03/09/16 1743  BP: (!) 167/65 (!) 150/80 (!) 166/73 (!) 178/82  Pulse: 91 66 76 78  Resp: 20 20 20 20   Temp: 98.6 F (37 C) 97.1 F (36.2 C) 98.2 F (36.8 C) 99.1 F (37.3 C)  TempSrc: Oral Oral Oral Oral  SpO2: 95% 95% 100% 99%  Weight:      Height:        Intake/Output Summary (Last 24 hours) at 03/09/16 1753 Last data filed at 03/09/16 1700  Gross per 24 hour  Intake             1060 ml  Output                0 ml  Net             1060 ml   Filed Weights   03/06/16 1200  Weight: (!) 161.8 kg (356 lb 11.3 oz)    Examination:  General exam: Sitting in chair, in nad Respiratory system: Clear to auscultation. Respiratory effort normal. Cardiovascular system: S1 & S2 heard, RRR Gastrointestinal system: Abdomen is nondistended, soft and nontender. No organomegaly or masses felt. Normal bowel sounds heard. Central nervous system: Alert and oriented. No focal neurological deficits. Extremities: Symmetric 5 x 5 power. Skin: No rashes, lesions  Psychiatry: Judgement and insight appear normal. Mood & affect appropriate.   Data Reviewed: I have personally reviewed following labs and imaging studies  CBC:  Recent Labs Lab 03/06/16 1217 03/06/16 1223 03/07/16 0307  WBC 6.6  --  9.3  NEUTROABS 3.8  --   --   HGB 12.9 13.6 12.5  HCT 40.3 40.0 39.5  MCV 94.2  --  93.4  PLT 224  --  218   Basic Metabolic Panel:  Recent Labs Lab 03/06/16 1217 03/06/16 1223 03/07/16 0307  NA 140 144 142  K 3.8 3.7 3.9  CL 106 106 107  CO2 24  --  25  GLUCOSE 113* 116* 130*  BUN 21* 22* 16  CREATININE 0.98 1.00 0.84  CALCIUM 9.4  --  9.2   GFR: Estimated Creatinine Clearance: 104.3 mL/min (by C-G formula based on SCr of 0.84 mg/dL). Liver Function Tests:  Recent Labs Lab 03/06/16 1217  AST 14*  ALT 15  ALKPHOS 78  BILITOT 0.3  PROT 7.0  ALBUMIN 3.5   No results  for input(s): LIPASE, AMYLASE in the last 168 hours. No results for input(s): AMMONIA in the last 168 hours. Coagulation Profile:  Recent Labs Lab 03/06/16 1217  INR 0.97   Cardiac Enzymes: No results for input(s): CKTOTAL, CKMB, CKMBINDEX, TROPONINI in the last 168 hours. BNP (last 3 results) No results for input(s): PROBNP in the last 8760 hours. HbA1C:  Recent Labs  03/07/16 0307  HGBA1C 6.3*   CBG:  Recent Labs Lab 03/08/16 1651 03/08/16 2113 03/09/16 0627 03/09/16 1115 03/09/16 1638  GLUCAP 99 117* 109* 107* 139*   Lipid Profile:  Recent Labs  03/07/16 0307  CHOL 196  HDL 47  LDLCALC 137*  TRIG 58  CHOLHDL 4.2   Thyroid Function Tests: No results for input(s): TSH, T4TOTAL, FREET4, T3FREE, THYROIDAB in the last 72 hours. Anemia Panel: No results for input(s): VITAMINB12, FOLATE, FERRITIN, TIBC, IRON, RETICCTPCT in the last 72 hours. Sepsis Labs: No results for input(s): PROCALCITON, LATICACIDVEN in the last 168 hours.  No results found for this or any previous visit (from the past 240 hour(s)).   Radiology Studies: No results found.  Scheduled Meds: . aspirin EC  325 mg Oral Daily  . atorvastatin  80 mg Oral q1800  . clopidogrel  75 mg Oral Daily  . enoxaparin (LOVENOX) injection  40 mg Subcutaneous Q24H  . insulin aspart  0-9 Units Subcutaneous TID WC   Continuous Infusions:   LOS: 3 days   Tyjai Charbonnet, Scheryl MartenSTEPHEN K, MD [facility] Pager [phone]  If 7PM-7AM, please contact night-coverage [email or web address] Password [email or web address] 03/09/2016, 5:53 PM

## 2016-03-09 NOTE — Clinical Social Work Placement (Signed)
   CLINICAL SOCIAL WORK PLACEMENT  NOTE  Date:  03/09/2016  Patient Details  Name: Megan NielsenBeverley Tech MRN: [id number] Date of Birth: 1950/09/17  Clinical Social Work is seeking post-discharge placement for this patient at the Skilled  Nursing Facility level of care (*CSW will initial, date and re-position this form in  chart as items are completed):  Yes   Patient/family provided with [place] Clinical Social Work Department's list of facilities offering this level of care within the geographic area requested by the patient (or if unable, by the patient's family).  Yes   Patient/family informed of their freedom to choose among providers that offer the needed level of care, that participate in Medicare, Medicaid or managed care program needed by the patient, have an available bed and are willing to accept the patient.  Yes   Patient/family informed of [facility]'s ownership interest [facility] and [facility], as well as of the fact that they are under no obligation to receive care at these facilities.  PASRR submitted to EDS on 03/09/16     PASRR number received on 03/09/16     Existing PASRR number confirmed on       FL2 transmitted to all facilities in geographic area requested by pt/family on 03/09/16     FL2 transmitted to all facilities within larger geographic area on       Patient informed that his/her managed care company has contracts with or will negotiate with certain facilities, including the following:            Patient/family informed of bed offers received.  Patient chooses bed at       Physician recommends and patient chooses bed at      Patient to be transferred to   on  .  Patient to be transferred to facility by       Patient family notified on   of transfer.  Name of family member notified:        PHYSICIAN Please sign FL2     Additional Comment:    _______________________________________________ Margarito LinerSarah C Ruhee Enck, LCSW 03/09/2016, 12:09  PM

## 2016-03-09 NOTE — Progress Notes (Signed)
Inpatient Rehabilitation  Met with patient to discuss team's recommendations for IP Rehab.  Shared booklets and answered questions.  Patient and spouse, Mliss Fritz confirm that he can provide 24/7 light/minimal physical assist at discharge.  Will initiate insurance authorization.  Plan to follow along for timing of medical readiness, insurance authorization, and bed availability.  Please call with questions.   Carmelia Roller., CCC/SLP Admission Coordinator  [facility]  Cell [phone]

## 2016-03-10 ENCOUNTER — Inpatient Hospital Stay (HOSPITAL_COMMUNITY)
Admission: RE | Admit: 2016-03-10 | Discharge: 2016-03-31 | DRG: 057 | Disposition: A | Discharge status: Home Health | Payer: Medicare Other | Source: Intra-hospital | Attending: Physical Medicine & Rehabilitation | Admitting: Physical Medicine & Rehabilitation

## 2016-03-10 DIAGNOSIS — Z6841 Body Mass Index (BMI) 40.0 and over, adult: Secondary | ICD-10-CM

## 2016-03-10 DIAGNOSIS — B9620 Unspecified Escherichia coli [E. coli] as the cause of diseases classified elsewhere: Secondary | ICD-10-CM | POA: Diagnosis present

## 2016-03-10 DIAGNOSIS — E119 Type 2 diabetes mellitus without complications: Secondary | ICD-10-CM | POA: Diagnosis present

## 2016-03-10 DIAGNOSIS — I69398 Other sequelae of cerebral infarction: Secondary | ICD-10-CM

## 2016-03-10 DIAGNOSIS — I69393 Ataxia following cerebral infarction: Secondary | ICD-10-CM | POA: Diagnosis not present

## 2016-03-10 DIAGNOSIS — Z7982 Long term (current) use of aspirin: Secondary | ICD-10-CM | POA: Diagnosis not present

## 2016-03-10 DIAGNOSIS — R269 Unspecified abnormalities of gait and mobility: Secondary | ICD-10-CM

## 2016-03-10 DIAGNOSIS — Z90710 Acquired absence of both cervix and uterus: Secondary | ICD-10-CM | POA: Diagnosis not present

## 2016-03-10 DIAGNOSIS — R7989 Other specified abnormal findings of blood chemistry: Secondary | ICD-10-CM

## 2016-03-10 DIAGNOSIS — I252 Old myocardial infarction: Secondary | ICD-10-CM

## 2016-03-10 DIAGNOSIS — I10 Essential (primary) hypertension: Secondary | ICD-10-CM

## 2016-03-10 DIAGNOSIS — G4733 Obstructive sleep apnea (adult) (pediatric): Secondary | ICD-10-CM | POA: Diagnosis present

## 2016-03-10 DIAGNOSIS — Z79899 Other long term (current) drug therapy: Secondary | ICD-10-CM

## 2016-03-10 DIAGNOSIS — I69354 Hemiplegia and hemiparesis following cerebral infarction affecting left non-dominant side: Principal | ICD-10-CM

## 2016-03-10 DIAGNOSIS — Z888 Allergy status to other drugs, medicaments and biological substances status: Secondary | ICD-10-CM | POA: Diagnosis not present

## 2016-03-10 DIAGNOSIS — I639 Cerebral infarction, unspecified: Secondary | ICD-10-CM | POA: Diagnosis present

## 2016-03-10 DIAGNOSIS — N390 Urinary tract infection, site not specified: Secondary | ICD-10-CM | POA: Diagnosis present

## 2016-03-10 DIAGNOSIS — D62 Acute posthemorrhagic anemia: Secondary | ICD-10-CM | POA: Diagnosis not present

## 2016-03-10 DIAGNOSIS — R1313 Dysphagia, pharyngeal phase: Secondary | ICD-10-CM | POA: Diagnosis present

## 2016-03-10 DIAGNOSIS — I259 Chronic ischemic heart disease, unspecified: Secondary | ICD-10-CM | POA: Diagnosis not present

## 2016-03-10 DIAGNOSIS — Z8249 Family history of ischemic heart disease and other diseases of the circulatory system: Secondary | ICD-10-CM

## 2016-03-10 DIAGNOSIS — K5901 Slow transit constipation: Secondary | ICD-10-CM

## 2016-03-10 DIAGNOSIS — F411 Generalized anxiety disorder: Secondary | ICD-10-CM | POA: Diagnosis not present

## 2016-03-10 DIAGNOSIS — I638 Other cerebral infarction: Secondary | ICD-10-CM | POA: Diagnosis not present

## 2016-03-10 DIAGNOSIS — I2510 Atherosclerotic heart disease of native coronary artery without angina pectoris: Secondary | ICD-10-CM | POA: Diagnosis present

## 2016-03-10 DIAGNOSIS — Z951 Presence of aortocoronary bypass graft: Secondary | ICD-10-CM

## 2016-03-10 DIAGNOSIS — E785 Hyperlipidemia, unspecified: Secondary | ICD-10-CM | POA: Diagnosis present

## 2016-03-10 LAB — URINALYSIS, ROUTINE W REFLEX MICROSCOPIC
Bilirubin Urine: NEGATIVE
Glucose, UA: NEGATIVE mg/dL
KETONES UR: NEGATIVE mg/dL
NITRITE: NEGATIVE
Protein, ur: NEGATIVE mg/dL
Specific Gravity, Urine: 1.015 (ref 1.005–1.030)
pH: 7 (ref 5.0–8.0)

## 2016-03-10 LAB — GLUCOSE, CAPILLARY
GLUCOSE-CAPILLARY: 77 mg/dL (ref 65–99)
Glucose-Capillary: 131 mg/dL — ABNORMAL HIGH (ref 65–99)
Glucose-Capillary: 102 mg/dL — ABNORMAL HIGH (ref 65–99)

## 2016-03-10 MED ORDER — ALUM & MAG HYDROXIDE-SIMETH 200-200-20 MG/5ML PO SUSP
30.0000 mL | ORAL | Status: DC | PRN
  Administered 2016-03-11: 30 mL via ORAL
  Filled 2016-03-10: qty 30

## 2016-03-10 MED ORDER — ATORVASTATIN CALCIUM 80 MG PO TABS
80.0000 mg | ORAL_TABLET | Freq: Every day | ORAL | Status: DC
  Administered 2016-03-10 – 2016-03-30 (×21): 80 mg via ORAL
  Filled 2016-03-10 (×21): qty 1

## 2016-03-10 MED ORDER — PROCHLORPERAZINE 25 MG RE SUPP: 12.5000 mg | Freq: Four times a day (QID) | RECTAL | Status: DC | PRN

## 2016-03-10 MED ORDER — CLOPIDOGREL BISULFATE 75 MG PO TABS: 75.0000 mg | ORAL_TABLET | Freq: Every day | ORAL | 0 refills | Status: DC

## 2016-03-10 MED ORDER — INSULIN ASPART 100 UNIT/ML ~~LOC~~ SOLN: 0.0000 [IU] | Freq: Three times a day (TID) | SUBCUTANEOUS | Status: DC

## 2016-03-10 MED ORDER — ENOXAPARIN SODIUM 80 MG/0.8ML ~~LOC~~ SOLN
80.0000 mg | SUBCUTANEOUS | Status: DC
  Administered 2016-03-10 – 2016-03-30 (×21): 80 mg via SUBCUTANEOUS
  Filled 2016-03-10 (×25): qty 0.8

## 2016-03-10 MED ORDER — SULFAMETHOXAZOLE-TRIMETHOPRIM 800-160 MG PO TABS: 1.0000 | ORAL_TABLET | Freq: Two times a day (BID) | ORAL | 0 refills | Status: DC

## 2016-03-10 MED ORDER — ASPIRIN EC 325 MG PO TBEC
325.0000 mg | DELAYED_RELEASE_TABLET | Freq: Every day | ORAL | Status: DC
  Administered 2016-03-11 – 2016-03-31 (×21): 325 mg via ORAL
  Filled 2016-03-10 (×21): qty 1

## 2016-03-10 MED ORDER — BISACODYL 10 MG RE SUPP
10.0000 mg | Freq: Every day | RECTAL | Status: DC | PRN
  Administered 2016-03-12 – 2016-03-15 (×2): 10 mg via RECTAL
  Filled 2016-03-10 (×3): qty 1

## 2016-03-10 MED ORDER — FLEET ENEMA 7-19 GM/118ML RE ENEM: 1.0000 | ENEMA | Freq: Once | RECTAL | Status: DC | PRN

## 2016-03-10 MED ORDER — DIPHENHYDRAMINE HCL 12.5 MG/5ML PO ELIX: 12.5000 mg | ORAL_SOLUTION | Freq: Four times a day (QID) | ORAL | Status: DC | PRN

## 2016-03-10 MED ORDER — TRAZODONE HCL 50 MG PO TABS: 25.0000 mg | ORAL_TABLET | Freq: Every evening | ORAL | Status: DC | PRN

## 2016-03-10 MED ORDER — SULFAMETHOXAZOLE-TRIMETHOPRIM 800-160 MG PO TABS
1.0000 | ORAL_TABLET | Freq: Two times a day (BID) | ORAL | Status: DC
  Administered 2016-03-10 – 2016-03-15 (×11): 1 via ORAL
  Filled 2016-03-10 (×12): qty 1

## 2016-03-10 MED ORDER — CLOPIDOGREL BISULFATE 75 MG PO TABS
75.0000 mg | ORAL_TABLET | Freq: Every day | ORAL | Status: DC
  Administered 2016-03-11 – 2016-03-31 (×21): 75 mg via ORAL
  Filled 2016-03-10 (×22): qty 1

## 2016-03-10 MED ORDER — ACETAMINOPHEN 325 MG PO TABS
325.0000 mg | ORAL_TABLET | ORAL | Status: DC | PRN
  Administered 2016-03-17 – 2016-03-30 (×3): 650 mg via ORAL
  Filled 2016-03-10 (×4): qty 2

## 2016-03-10 MED ORDER — PROCHLORPERAZINE EDISYLATE 5 MG/ML IJ SOLN: 5.0000 mg | Freq: Four times a day (QID) | INTRAMUSCULAR | Status: DC | PRN

## 2016-03-10 MED ORDER — PROCHLORPERAZINE MALEATE 5 MG PO TABS: 5.0000 mg | ORAL_TABLET | Freq: Four times a day (QID) | ORAL | Status: DC | PRN

## 2016-03-10 MED ORDER — GUAIFENESIN-DM 100-10 MG/5ML PO SYRP: 5.0000 mL | ORAL_SOLUTION | Freq: Four times a day (QID) | ORAL | Status: DC | PRN

## 2016-03-10 MED ORDER — POLYETHYLENE GLYCOL 3350 17 G PO PACK
17.0000 g | PACK | Freq: Every day | ORAL | Status: DC
  Administered 2016-03-11 – 2016-03-13 (×3): 17 g via ORAL
  Filled 2016-03-10 (×4): qty 1

## 2016-03-10 NOTE — H&P (Signed)
Physical Medicine and Rehabilitation Admission H&P       Chief Complaint  Patient presents with  . Left sided weakness and dizziness.   HPI: Megan Atkinson a 66 y.o.femalewith history of CAD, HTN, morbid obesity who was admitted on 03/06/16 with vertigo and left sided weakness with numbness. CT head reviewed, showing left cerebellar infarct, thought to be subacute. TPA not administered. CTA head/neck no significant stenosis in neck and moderate focal stenosis left VA. 2D echo with eF 60-65% with mild to moderately calcified aortic valve and grade I diastolic dysfunction. BLE dopplers negative for DVT. Dr. Roda Shutters felt that stroke due to atherosclerosis and multiple risk factors. He recommended ASA/Plavix for three months followed by plavix alone. MBS done 1/30 revealing mild pharyngeal dysphagia with penetration of thin therefore chin tuck with thin liquids recommended. Swallow re-eval done today showing improvement and no need for chin tuck.  Patient with resultant visual deficits with vestibular issues, difficulty with weight shifting, stooped posture and left lateral lean with ambulation. Therapy ongoing and  CIR recommended for follow up therapy.    Review of Systems  HENT: Negative for ear discharge, hearing loss and tinnitus.   Eyes: Positive for blurred vision (vision improving in left eye). Negative for double vision and photophobia.  Respiratory: Negative for cough, shortness of breath and stridor.   Cardiovascular: Positive for leg swelling. Negative for chest pain and palpitations.  Gastrointestinal: Negative for abdominal pain, constipation, heartburn and nausea.  Genitourinary: Negative for dysuria, frequency and urgency.  Musculoskeletal: Positive for joint pain (occasional knee pain). Negative for myalgias and neck pain.  Skin: Negative for rash.  Neurological: Positive for focal weakness and weakness. Negative for dizziness and headaches.  Psychiatric/Behavioral:  Negative for hallucinations. The patient is not nervous/anxious and does not have insomnia.           Past Medical History:  Diagnosis Date  . CAD (coronary artery disease)    small NSTEMI 11/12: LHC 01/06/11:  LAD 90-95%, mD1 90%, pD2 50%,  pOM1 50%, RCA occluded,  EF > 65%;  CABG  12/12: L-LAD  . Diabetes mellitus   . Glucose intolerance (impaired glucose tolerance)    Hemoglobin A1c 6.4 in 12/2010  . HTN (hypertension)    Echo 01/06/11: EF 65%  . Hyperlipidemia   . Obesity          Past Surgical History:  Procedure Laterality Date  . ABDOMINAL HYSTERECTOMY    . APPENDECTOMY    . CORONARY ARTERY BYPASS GRAFT  01/12/2011   Procedure: CORONARY ARTERY BYPASS GRAFTING (CABG);  Surgeon: Alleen Borne, MD;  Location: [facility] OR;  Service: Open Heart Surgery;  Laterality: N/A;  coronary artery bypass graft times one using left internal mammary artery . Attempted endoscopic saphenous vein harvest  . LEFT HEART CATHETERIZATION WITH CORONARY ANGIOGRAM N/A 01/06/2011   Procedure: LEFT HEART CATHETERIZATION WITH CORONARY ANGIOGRAM;  Surgeon: Rollene Rotunda, MD;  Location: [facility] CATH LAB;  Service: Cardiovascular;  Laterality: N/A;          Family History  Problem Relation Age of Onset  . Coronary artery disease      No family history    Social History:  Married. Husband with multiple medical issues--can provide supervision after discharge. 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 She reports that she has never smoked. She has never used smokeless tobacco. She reports that she drinks alcohol on rare occasions. She reports that she does not  use drugs.         Allergies  Allergen Reactions  . Lisinopril     cough  . Metformin And Related Diarrhea    A lot of fluid loss from bowel and diarrhea          Medications Prior to Admission  Medication Sig Dispense Refill  . aspirin 325 MG tablet Take 325 mg by mouth daily.       . Calcium Carbonate-Vitamin D (CALCIUM-VITAMIN D) 500-200 MG-UNIT tablet Take 1 tablet by mouth daily.    Marland Kitchen glucosamine-chondroitin 500-400 MG tablet Take 1 tablet by mouth daily.      . metoprolol (LOPRESSOR) 50 MG tablet TAKE ONE TABLET BY MOUTH TWICE DAILY 60 tablet 2  . pravastatin (PRAVACHOL) 80 MG tablet TAKE ONE TABLET BY MOUTH ONCE DAILY 90 tablet 2  . Cyanocobalamin (VITAMIN B-12 IJ) weekly    . furosemide (LASIX) 20 MG tablet TAKE ONE TABLET BY MOUTH ONCE DAILY (Patient not taking: Reported on 03/06/2016) 30 tablet 0  . Ginkgo 60 MG TABS Take 1 tablet by mouth daily. For memory     . Green Tea, Camillia sinensis, 1000 MG TABS Take 1 tablet by mouth daily.       Home: Home Living Family/patient expects to be discharged to:: Inpatient rehab Living Arrangements: Alone Available Help at Discharge: Family, Available 24 hours/day, Available PRN/intermittently, Friend(s) Type of Home: House Home Access: Stairs to enter [employer] of Steps: 6 (but reports level entry at front Home Layout: Two level, 1/2 bath on main level (plans to stay on main level) Bathroom Shower/Tub: Engineer, manufacturing systems: Standard Home Equipment: Environmental consultant - 2 wheels  Lives With: Alone   Functional History: Prior Function Level of Independence: Independent Comments: Has walker but does not use this. 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  Functional Status:  Mobility: Bed Mobility Overal bed mobility: Needs Assistance Bed Mobility: Supine to Sit Supine to sit: Min assist General bed mobility comments: pt in chair upon arrival Transfers Overall transfer level: Needs assistance Equipment used: Rolling walker (2 wheeled) Transfer via Lift Equipment: Stedy Transfers: Sit to/from Stand Sit to Stand: +2 physical assistance, Mod assist General transfer comment: Pt required cuing for proper hand placement and mod A +2 for boost into standing from chair  x2. Ambulation/Gait Ambulation/Gait assistance: Mod assist, +2 physical assistance (3rd person for w/c follow) Ambulation Distance (Feet): 8 Feet Assistive device: Rolling walker (2 wheeled) Gait Pattern/deviations: Trunk flexed, Decreased weight shift to right, Decreased step length - right, Decreased step length - left General Gait Details: Pt required two trials due to first attempt being limited by fear and pt requesting to sit down. Second attempt, pt was able to walk about 10 feet with mod A +3 for w/c follow and patient/ staff safety. Pt unable to maintain upright posture without cuing and assistance.; mod A +2 to assist compenstation of left lateral lean.  Gait velocity: slow, fearful  ADL: ADL Overall ADL's : Needs assistance/impaired Eating/Feeding: Minimal assistance, Sitting Grooming: Minimal assistance, Standing Grooming Details (indicate cue type and reason): using STEDY at sink Upper Body Bathing: Moderate assistance, Sitting Upper Body Bathing Details (indicate cue type and reason): Up to mod assist to maintain balance in sitting. Lower Body Bathing: Maximal assistance, Sit to/from stand Lower Body Bathing Details (indicate cue type and reason): sit to/from stand using STEDY Upper Body Dressing : Moderate assistance, Sitting Upper Body Dressing Details (indicate cue type and reason):  Up to mod assist to maintain balance in sitting. Lower Body Dressing: Maximal assistance, Sit to/from stand Lower Body Dressing Details (indicate cue type and reason): sit to/from stand using STEDY Toilet Transfer: +2 for physical assistance, Maximal assistance, BSC, RW Toilet Transfer Details (indicate cue type and reason): Simulated. Pt performed sit to/from stand from recliner with mod assist +2.  Toileting- Clothing Manipulation and Hygiene: Total assistance, Sit to/from stand Tub/Shower Transfer Details (indicate cue type and reason): Did not occur.  General ADL Comments: Pt able to  complete simulated toilet transfer wtih Stedy and max assist of 2. Educated pt and son on ADL strategies during recovery.  Cognition: Cognition Overall Cognitive Status: Within Functional Limits for tasks assessed Arousal/Alertness: Awake/alert Orientation Level: Oriented X4 Memory: Appears intact Awareness: Appears intact Problem Solving: Appears intact Safety/Judgment: Appears intact Cognition Arousal/Alertness: Awake/alert Behavior During Therapy: WFL for tasks assessed/performed Overall Cognitive Status: Within Functional Limits for tasks assessed General Comments: Pt able to engage and follow commands with various tasks performed.    Blood pressure (!) 158/56, pulse 72, temperature 97.7 F (36.5 C), temperature source Oral, resp. rate 18, height 5\' 7"  (1.702 m), weight (!) 161.8 kg (356 lb 11.3 oz), SpO2 99 %. Physical Exam  Nursing note and vitals reviewed. Constitutional: She is oriented to person, place, and time. She appears well-developed and well-nourished.  Morbidly obese female. Strong odor of urine in the room.   HENT:  Head: Normocephalic and atraumatic.  Mouth/Throat: Oropharynx is clear and moist.  Eyes: Conjunctivae are normal. Pupils are equal, round, and reactive to light.  Neck: Normal range of motion. Neck supple.  Cardiovascular: Normal rate and regular rhythm.  Exam reveals no friction rub.   Murmur heard. Respiratory: Effort normal and breath sounds normal. No stridor. No respiratory distress. She has no wheezes. She has no rales. She exhibits no tenderness.  GI: Soft. Bowel sounds are normal. She exhibits no distension. There is no tenderness.  Musculoskeletal: She exhibits edema (1+ pedal edema on left).  Neurological: She is alert and oriented to person, place, and time.  Voice appears stronger today. Able to follow commands without difficulty. Few beats horizontal nystagmus to the left. Decrease in fine motor control LUE.  Sensation intact to light  touch.  Motor: RUE and RLE grossly 5/5 prox to distal LUE: 4+/5 proximal to distal with limb ataxia LLE: HF, KE 4/5, ADF/PF 4+/5 proximal to distal (somehat limited by body habitus) DTRs grossly 1+ bilaterally   Skin: Skin is warm and dry.  Psychiatric: She has a normal mood and affect. Her behavior is normal. Judgment and thought content normal.    Lab Results Last 48 Hours       Results for orders placed or performed during the hospital encounter of 03/06/16 (from the past 48 hour(s))  Glucose, capillary     Status: None   Collection Time: 03/08/16  4:51 PM  Result Value Ref Range   Glucose-Capillary 99 65 - 99 mg/dL  Glucose, capillary     Status: Abnormal   Collection Time: 03/08/16  9:13 PM  Result Value Ref Range   Glucose-Capillary 117 (H) 65 - 99 mg/dL   Comment 1 Notify RN    Comment 2 Document in Chart   Glucose, capillary     Status: Abnormal   Collection Time: 03/09/16  6:27 AM  Result Value Ref Range   Glucose-Capillary 109 (H) 65 - 99 mg/dL   Comment 1 Notify RN    Comment 2 Document  in Chart   Glucose, capillary     Status: Abnormal   Collection Time: 03/09/16 11:15 AM  Result Value Ref Range   Glucose-Capillary 107 (H) 65 - 99 mg/dL  Glucose, capillary     Status: Abnormal   Collection Time: 03/09/16  4:38 PM  Result Value Ref Range   Glucose-Capillary 139 (H) 65 - 99 mg/dL  Glucose, capillary     Status: Abnormal   Collection Time: 03/09/16  9:09 PM  Result Value Ref Range   Glucose-Capillary 110 (H) 65 - 99 mg/dL  Glucose, capillary     Status: None   Collection Time: 03/10/16  6:29 AM  Result Value Ref Range   Glucose-Capillary 77 65 - 99 mg/dL   Comment 1 Notify RN    Comment 2 Document in Chart   Glucose, capillary     Status: Abnormal   Collection Time: 03/10/16  8:59 AM  Result Value Ref Range   Glucose-Capillary 131 (H) 65 - 99 mg/dL  Glucose, capillary     Status: Abnormal   Collection Time: 03/10/16 11:48 AM    Result Value Ref Range   Glucose-Capillary 102 (H) 65 - 99 mg/dL     Imaging Results (Last 48 hours)  No results found.       Medical Problem List and Plan: 1.  Ataxia and functional deficits secondary to left cerebellar infarct             -admit to inpatient rehhab 2.  DVT Prophylaxis/Anticoagulation: Pharmaceutical: Lovenox 3. Pain Management: N/A 4. Mood: Team to provide ego support. LCSW to follow for evaluation and support.  5. Neuropsych: This patient is capable of making decisions on her own behalf. 6. Skin/Wound Care: Routine pressure relief measures.  7. Fluids/Electrolytes/Nutrition: Monitor I/O. Check lytes in am.  8. HTN: Monitor BP bid. Permissive HTN for 5-7 days.--resume Lasix/metoprolol as indicated.  9. OSA: Continue to encourage consistent use of CPAP 10. Morbid obesity: BMI 55.87. Will consult dietician to educate on CM/Heart healthy diet. Encourage appropriate diet and exercise to help promote weight loss to help promote health,  11.  Pyuria:  Will check urine culture and start septra DS.   12. Prediabetes: Hgb A1C-6.3. CM diet reinforced. Monitor BS ac/hs for trends.  13.  CAD s/p CABG: On ASA and Lipitor    Post Admission Physician Evaluation: 1. Functional deficits secondary  to left cerebellar infarct. 2. Patient is admitted to receive collaborative, interdisciplinary care between the physiatrist, rehab nursing staff, and therapy team. 3. Patient's level of medical complexity and substantial therapy needs in context of that medical necessity cannot be provided at a lesser intensity of care such as a SNF. 4. Patient has experienced substantial functional loss from his/her baseline which was documented above under the "Functional History" and "Functional Status" headings.  Judging by the patient's diagnosis, physical exam, and functional history, the patient has potential for functional progress which will result in measurable gains while on inpatient  rehab.  These gains will be of substantial and practical use upon discharge  in facilitating mobility and self-care at the household level. 5. Physiatrist will provide 24 hour management of medical needs as well as oversight of the therapy plan/treatment and provide guidance as appropriate regarding the interaction of the two. 6. The Preadmission Screening has been reviewed and patient status is unchanged unless otherwise stated above. 7. 24 hour rehab nursing will assist with bladder management, bowel management, safety, skin/wound care, disease management, medication administration, pain management and  patient education  and help integrate therapy concepts, techniques,education, etc. 8. PT will assess and treat for/with: Lower extremity strength, range of motion, stamina, balance, functional mobility, safety, adaptive techniques and equipment, NMR, vestibular assessment and rx.   Goals are: supervision. 9. OT will assess and treat for/with: ADL's, functional mobility, safety, upper extremity strength, adaptive techniques and equipment, NMR, education.   Goals are: supervision to min assist. Therapy may proceed with showering this patient. 10. SLP will assess and treat for/with: speech, swallowing, education.  Goals are: mod I. 11. Case Management and Social Worker will assess and treat for psychological issues and discharge planning. 12. Team conference will be held weekly to assess progress toward goals and to determine barriers to discharge. 13. Patient will receive at least 3 hours of therapy per day at least 5 days per week. 14. ELOS: 17-20 days       15. Prognosis:  excellent     Ranelle Oyster, MD, [facility] Physical Medicine & Rehabilitation 03/10/2016  Jacquelynn Cree, Cordelia Poche 03/10/2016

## 2016-03-10 NOTE — Progress Notes (Signed)
Ankit Karis Juba, MD Physician Signed Physical Medicine and Rehabilitation  Consult Note Date of Service: 03/08/2016 12:59 PM  Related encounter: ED to Hosp-Admission (Discharged) from 03/06/2016 in [facility] 51M NEURO MEDICAL     Expand All Collapse All   [] Hide copied text [] Hover for attribution information      Physical Medicine and Rehabilitation Consult  Reason for Consult: Left sided weakness and dizziness.  Referring Physician: Dr. Roda Shutters   HPI: Megan Atkinson is a 66 y.o. female with history of CAD, HTN, morbid obesity who was admitted on 03/06/16 with vertigo and left sided weakness with numbness. CT head reviewed, showing left cerebellar infarct, thought to be subacute. TPA not administered. CTA head/neck no significant stenosis in neck and moderate focal stenosis left VA.  2D echo with eF 60-65% with mild to moderately calcified aortic valve and grade I diastolic dysfunction. BLE dopplers negative for DVT. Dr. Roda Shutters felt that stroke due to atherosclerosis and multiple risk factors.  He recommended ASA/Plavix for three months followed by plavix alone.  Therapy evaluations done revealing visual deficits with vestibular issues, difficulty with weight shifting and unsafe to ambulate. CIR recommended for follow up therapy.   Review of Systems  HENT: Negative for ear pain, hearing loss and tinnitus.   Eyes: Negative for blurred vision and double vision.  Respiratory: Positive for shortness of breath. Negative for cough.   Cardiovascular: Positive for leg swelling. Negative for chest pain and palpitations.  Gastrointestinal: Negative for constipation, heartburn and nausea.  Genitourinary: Negative for dysuria.       Stress incontinence   Musculoskeletal: Positive for joint pain (knee pain/weak knees). Negative for back pain, myalgias and neck pain.  Neurological: Positive for speech change. Negative for dizziness and headaches.  Psychiatric/Behavioral: Negative for  depression. The patient is not nervous/anxious and does not have insomnia.   All other systems reviewed and are negative.         Past Medical History:  Diagnosis Date  . CAD (coronary artery disease)    small NSTEMI 11/12: LHC 01/06/11:  LAD 90-95%, mD1 90%, pD2 50%,  pOM1 50%, RCA occluded,  EF > 65%;  CABG  12/12: L-LAD  . Diabetes mellitus   . Glucose intolerance (impaired glucose tolerance)    Hemoglobin A1c 6.4 in 12/2010  . HTN (hypertension)    Echo 01/06/11: EF 65%  . Hyperlipidemia   . Obesity          Past Surgical History:  Procedure Laterality Date  . ABDOMINAL HYSTERECTOMY    . APPENDECTOMY    . CORONARY ARTERY BYPASS GRAFT  01/12/2011   Procedure: CORONARY ARTERY BYPASS GRAFTING (CABG);  Surgeon: Alleen Borne, MD;  Location: [facility] OR;  Service: Open Heart Surgery;  Laterality: N/A;  coronary artery bypass graft times one using left internal mammary artery . Attempted endoscopic saphenous vein harvest  . LEFT HEART CATHETERIZATION WITH CORONARY ANGIOGRAM N/A 01/06/2011   Procedure: LEFT HEART CATHETERIZATION WITH CORONARY ANGIOGRAM;  Surgeon: Rollene Rotunda, MD;  Location: [facility] CATH LAB;  Service: Cardiovascular;  Laterality: N/A;          Family History  Problem Relation Age of Onset  . Coronary artery disease      No family history    Social History:  Married. Husband with multiple medical issues--can provide supervision after discharge. 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 She  reports that she has never smoked. She has never used smokeless  tobacco. She reports that she drinks alcohol on rare occasions. She reports that she does not use drugs.         Allergies  Allergen Reactions  . Lisinopril     cough  . Metformin And Related Diarrhea    A lot of fluid loss from bowel          Medications Prior to Admission  Medication Sig Dispense Refill  . aspirin 325 MG tablet Take 325 mg by  mouth daily.      . Calcium Carbonate-Vitamin D (CALCIUM-VITAMIN D) 500-200 MG-UNIT tablet Take 1 tablet by mouth daily.    Marland Kitchen glucosamine-chondroitin 500-400 MG tablet Take 1 tablet by mouth daily.      . metoprolol (LOPRESSOR) 50 MG tablet TAKE ONE TABLET BY MOUTH TWICE DAILY 60 tablet 2  . pravastatin (PRAVACHOL) 80 MG tablet TAKE ONE TABLET BY MOUTH ONCE DAILY 90 tablet 2  . Cyanocobalamin (VITAMIN B-12 IJ) weekly    . furosemide (LASIX) 20 MG tablet TAKE ONE TABLET BY MOUTH ONCE DAILY (Patient not taking: Reported on 03/06/2016) 30 tablet 0  . Ginkgo 60 MG TABS Take 1 tablet by mouth daily. For memory     . Green Tea, Camillia sinensis, 1000 MG TABS Take 1 tablet by mouth daily.       Home: Home Living Family/patient expects to be discharged to:: Inpatient rehab Living Arrangements: Alone Available Help at Discharge: Family, Available 24 hours/day, Available PRN/intermittently, Friend(s) (reports a retired friend down the street can stay. son works) Type of Home: [employer] Access: Stairs to enter [employer] of Steps: 6 (but reports level entry at front Home Layout: Two level, 1/2 bath on main level (plans to stay on main level) Bathroom Shower/Tub: Engineer, manufacturing systems: Standard Home Equipment: Environmental consultant - 2 wheels  Functional History: Prior Function Level of Independence: Independent Comments: Has walker but does not use this. 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 Functional Status:  Mobility: Bed Mobility Overal bed mobility: Needs Assistance Bed Mobility: Supine to Sit Supine to sit: Mod assist General bed mobility comments: assisted by OT prior to my arrival Transfers Overall transfer level: Needs assistance Equipment used: Rolling walker (2 wheeled) Transfer via Lift Equipment: Stedy Transfers: Sit to/from [employer] to Stand: Max assist, +2 physical assistance General transfer comment: sit to stand with +2 A needed  to weight shift her to R to achieve upright standing, with stedy able to stand easier still leaning L Ambulation/Gait General Gait Details: not tested, not safe  ADL: ADL Overall ADL's : Needs assistance/impaired Grooming: Sitting, Minimal assistance Upper Body Bathing: Moderate assistance, Sitting Upper Body Bathing Details (indicate cue type and reason): Up to mod assist to maintain balance in sitting. Lower Body Bathing: Maximal assistance, Sitting/lateral leans Upper Body Dressing : Moderate assistance, Sitting Lower Body Dressing: Maximal assistance, Sitting/lateral leans Toilet Transfer: +2 for physical assistance, Maximal assistance, BSC Toilet Transfer Details (indicate cue type and reason): Simulated using Stedy. Attempted sit<>stand with max assist +2 with RW and unable to complete Toileting- Clothing Manipulation and Hygiene: Total assistance, Sit to/from stand General ADL Comments: Pt able to complete simulated toilet transfer wtih Stedy and max assist of 2. Educated pt and son on ADL strategies during recovery.  Cognition: Cognition Overall Cognitive Status: Within Functional Limits for tasks assessed Orientation Level: Oriented X4 Cognition Arousal/Alertness: Awake/alert Behavior During Therapy: WFL for tasks assessed/performed Overall Cognitive Status: Within Functional Limits for tasks assessed General Comments:  Cognition in tact for tasks assessed. Pt able to problem solve simple ADL tasks. Will continue to assess.  Blood pressure (!) 165/58, pulse 65, temperature 98.1 F (36.7 C), temperature source Oral, resp. rate 18, height 5\' 7"  (1.702 m), weight (!) 161.8 kg (356 lb 11.3 oz), SpO2 100 %. Physical Exam  Nursing note and vitals reviewed. Constitutional: She is oriented to person, place, and time. She appears well-developed and well-nourished.  HENT:  Head: Normocephalic and atraumatic.  Mouth/Throat: Oropharynx is clear and moist.  Eyes: Conjunctivae are  normal. Pupils are equal, round, and reactive to light. No scleral icterus.  Neck: Normal range of motion. Neck supple.  Cardiovascular: Normal rate and regular rhythm.   No murmur heard. Respiratory: Effort normal and breath sounds normal. No stridor. No respiratory distress. She has no wheezes.  GI: Soft. Bowel sounds are normal. She exhibits no distension. There is no tenderness.  Musculoskeletal: She exhibits edema. She exhibits no tenderness.  Neurological: She is alert and oriented to person, place, and time.  Mild dysarthria.  Horizontal nystagmus to left field.  Able to follow basic commands without difficulty. Sensation intact to light touch.  Motor: RUE: 5/5 proximal to distal LUE: 4+-5/5 proximal to distal with ataxia B/l LE: HF, KE 4/5, ADF/PF 4+/5 proximal to distal (limited by body habitus) DTRs grossly symmetric (limited by body habitus)  Skin: Skin is warm and dry.  Psychiatric: She has a normal mood and affect. Her behavior is normal. Thought content normal.    Lab Results Last 24 Hours       Results for orders placed or performed during the hospital encounter of 03/06/16 (from the past 24 hour(s))  Glucose, capillary     Status: Abnormal   Collection Time: 03/07/16  4:43 PM  Result Value Ref Range   Glucose-Capillary 154 (H) 65 - 99 mg/dL  Glucose, capillary     Status: Abnormal   Collection Time: 03/07/16  6:40 PM  Result Value Ref Range   Glucose-Capillary 168 (H) 65 - 99 mg/dL  Glucose, capillary     Status: Abnormal   Collection Time: 03/07/16  9:49 PM  Result Value Ref Range   Glucose-Capillary 105 (H) 65 - 99 mg/dL   Comment 1 Notify RN    Comment 2 Document in Chart   Urine rapid drug screen (hosp performed)not at [facility]     Status: None   Collection Time: 03/07/16 11:12 PM  Result Value Ref Range   Opiates NONE DETECTED NONE DETECTED   Cocaine NONE DETECTED NONE DETECTED   Benzodiazepines NONE DETECTED NONE DETECTED   Amphetamines  NONE DETECTED NONE DETECTED   Tetrahydrocannabinol NONE DETECTED NONE DETECTED   Barbiturates NONE DETECTED NONE DETECTED  Urinalysis, Routine w reflex microscopic     Status: Abnormal   Collection Time: 03/07/16 11:13 PM  Result Value Ref Range   Color, Urine YELLOW YELLOW   APPearance CLOUDY (A) CLEAR   Specific Gravity, Urine 1.015 1.005 - 1.030   pH 7.0 5.0 - 8.0   Glucose, UA NEGATIVE NEGATIVE mg/dL   Hgb urine dipstick SMALL (A) NEGATIVE   Bilirubin Urine NEGATIVE NEGATIVE   Ketones, ur NEGATIVE NEGATIVE mg/dL   Protein, ur NEGATIVE NEGATIVE mg/dL   Nitrite POSITIVE (A) NEGATIVE   Leukocytes, UA LARGE (A) NEGATIVE   RBC / HPF 0-5 0 - 5 RBC/hpf   WBC, UA TOO NUMEROUS TO COUNT 0 - 5 WBC/hpf   Bacteria, UA MANY (A) NONE SEEN   Squamous Epithelial /  LPF 0-5 (A) NONE SEEN   WBC Clumps PRESENT    Mucous PRESENT    Hyaline Casts, UA PRESENT   Glucose, capillary     Status: Abnormal   Collection Time: 03/08/16  6:03 AM  Result Value Ref Range   Glucose-Capillary 107 (H) 65 - 99 mg/dL   Comment 1 Notify RN    Comment 2 Document in Chart   Glucose, capillary     Status: Abnormal   Collection Time: 03/08/16 12:36 PM  Result Value Ref Range   Glucose-Capillary 135 (H) 65 - 99 mg/dL      Imaging Results (Last 48 hours)  Ct Angio Head W/cm &/or Wo Cm  Result Date: 03/07/2016 CLINICAL DATA:  Follow-up stroke. History of hypertension, hyperlipidemia and diabetes. EXAM: CT ANGIOGRAPHY HEAD AND NECK TECHNIQUE: Multidetector CT imaging of the head and neck was performed using the standard protocol during bolus administration of intravenous contrast. Multiplanar CT image reconstructions and MIPs were obtained to evaluate the vascular anatomy. Carotid stenosis measurements (when applicable) are obtained utilizing NASCET criteria, using the distal internal carotid diameter as the denominator. CONTRAST:  50 cc Isovue 370 COMPARISON:  CT HEAD March 06, 2016  FINDINGS: CTA NECK AORTIC ARCH: Normal appearance of the thoracic arch, normal branch pattern. Mild calcific atherosclerosis of aortic arch. The origins of the innominate, left Common carotid artery and subclavian artery are widely patent. RIGHT CAROTID SYSTEM: Common carotid artery is widely patent, coursing in a straight line fashion. Normal appearance of the carotid bifurcation without hemodynamically significant stenosis by NASCET criteria. Normal appearance of the included internal carotid artery. LEFT CAROTID SYSTEM: Common carotid artery is widely patent, coursing in a straight line fashion. Normal appearance of the carotid bifurcation without hemodynamically significant stenosis by NASCET criteria. Normal appearance of the included internal carotid artery. VERTEBRAL ARTERIES:Left vertebral artery is dominant. Normal appearance of the vertebral arteries, which appear widely patent. SKELETON: No acute osseous process though bone windows have not been submitted. Multiple bilateral subcentimeter retro pectoral lymph nodes. Patient is edentulous. Moderate degenerative change of the cervical spine. OTHER NECK: Soft tissues of the neck are non-acute though, not tailored for evaluation. Status post median sternotomy. CTA HEAD ANTERIOR CIRCULATION: Normal appearance of the cervical internal carotid arteries, petrous, cavernous and supra clinoid internal carotid arteries. Widely patent anterior communicating artery. Normal appearance of the anterior and middle cerebral arteries. No large vessel occlusion, hemodynamically significant stenosis, dissection, luminal irregularity, contrast extravasation or aneurysm. POSTERIOR CIRCULATION: Moderate focal stenosis LEFT V4 segment due to eccentric calcific atherosclerosis. Normal appearance of the vertebrobasilar junction and basilar artery, as well as main branch vessels with exception of LEFT posterior-inferior cerebellar artery. Normal appearance of the posterior cerebral  arteries, mild stenosis RIGHT P1 origin. Small RIGHT posterior communicating artery present. No large vessel occlusion, hemodynamically significant stenosis, dissection, luminal irregularity, contrast extravasation or aneurysm. VENOUS SINUSES: Major dural venous sinuses are patent though not tailored for evaluation on this angiographic examination. ANATOMIC VARIANTS: None. DELAYED PHASE: Not performed. IMPRESSION: CTA NECK: No hemodynamically significant stenosis or acute vascular process in the neck. CTA HEAD: Poor visualization of LEFT posterior-inferior cerebellar artery, possibly occluded. Moderate focal stenosis LEFT vertebral artery. No emergent large vessel occlusion or severe stenosis. Electronically Signed   By: Awilda Metro M.D.   On: 03/07/2016 01:35   Ct Angio Neck W/cm &/or Wo/cm  Result Date: 03/07/2016 CLINICAL DATA:  Follow-up stroke. History of hypertension, hyperlipidemia and diabetes. EXAM: CT ANGIOGRAPHY HEAD AND NECK TECHNIQUE: Multidetector CT  imaging of the head and neck was performed using the standard protocol during bolus administration of intravenous contrast. Multiplanar CT image reconstructions and MIPs were obtained to evaluate the vascular anatomy. Carotid stenosis measurements (when applicable) are obtained utilizing NASCET criteria, using the distal internal carotid diameter as the denominator. CONTRAST:  50 cc Isovue 370 COMPARISON:  CT HEAD March 06, 2016 FINDINGS: CTA NECK AORTIC ARCH: Normal appearance of the thoracic arch, normal branch pattern. Mild calcific atherosclerosis of aortic arch. The origins of the innominate, left Common carotid artery and subclavian artery are widely patent. RIGHT CAROTID SYSTEM: Common carotid artery is widely patent, coursing in a straight line fashion. Normal appearance of the carotid bifurcation without hemodynamically significant stenosis by NASCET criteria. Normal appearance of the included internal carotid artery. LEFT CAROTID  SYSTEM: Common carotid artery is widely patent, coursing in a straight line fashion. Normal appearance of the carotid bifurcation without hemodynamically significant stenosis by NASCET criteria. Normal appearance of the included internal carotid artery. VERTEBRAL ARTERIES:Left vertebral artery is dominant. Normal appearance of the vertebral arteries, which appear widely patent. SKELETON: No acute osseous process though bone windows have not been submitted. Multiple bilateral subcentimeter retro pectoral lymph nodes. Patient is edentulous. Moderate degenerative change of the cervical spine. OTHER NECK: Soft tissues of the neck are non-acute though, not tailored for evaluation. Status post median sternotomy. CTA HEAD ANTERIOR CIRCULATION: Normal appearance of the cervical internal carotid arteries, petrous, cavernous and supra clinoid internal carotid arteries. Widely patent anterior communicating artery. Normal appearance of the anterior and middle cerebral arteries. No large vessel occlusion, hemodynamically significant stenosis, dissection, luminal irregularity, contrast extravasation or aneurysm. POSTERIOR CIRCULATION: Moderate focal stenosis LEFT V4 segment due to eccentric calcific atherosclerosis. Normal appearance of the vertebrobasilar junction and basilar artery, as well as main branch vessels with exception of LEFT posterior-inferior cerebellar artery. Normal appearance of the posterior cerebral arteries, mild stenosis RIGHT P1 origin. Small RIGHT posterior communicating artery present. No large vessel occlusion, hemodynamically significant stenosis, dissection, luminal irregularity, contrast extravasation or aneurysm. VENOUS SINUSES: Major dural venous sinuses are patent though not tailored for evaluation on this angiographic examination. ANATOMIC VARIANTS: None. DELAYED PHASE: Not performed. IMPRESSION: CTA NECK: No hemodynamically significant stenosis or acute vascular process in the neck. CTA HEAD: Poor  visualization of LEFT posterior-inferior cerebellar artery, possibly occluded. Moderate focal stenosis LEFT vertebral artery. No emergent large vessel occlusion or severe stenosis. Electronically Signed   By: Awilda Metro M.D.   On: 03/07/2016 01:35   Mr Brain Wo Contrast  Result Date: 03/07/2016 CLINICAL DATA:  66 year old female code stroke, left side weakness and numbness. Left cerebellar infarct on presentation head CT 03/06/2016. Suspicion of left PICA occlusion on CTA head 0135 hours today. Initial encounter. EXAM: MRI HEAD WITHOUT CONTRAST TECHNIQUE: Multiplanar, multiecho pulse sequences of the brain and surrounding structures were obtained without intravenous contrast. COMPARISON:  CTA head and neck 0135 hours today. Head CT without contrast 03/06/2016 FINDINGS: Brain: Patchy and confluent restricted diffusion in the inferior left cerebellum and also affecting the left lateral and central medulla (series 3, image 11). Associated T2 and FLAIR hyperintensity. No associated hemorrhage. No significant mass effect. No other restricted diffusion. No midline shift, mass effect, evidence of mass lesion, ventriculomegaly, extra-axial collection or acute intracranial hemorrhage. Cervicomedullary junction and pituitary are within normal limits. Dural calcifications, especially along the superior interhemispheric fissure. Mild for age nonspecific periventricular white matter T2 and FLAIR hyperintensity. No cortical encephalomalacia. No definite chronic cerebral blood products. Cerebral volume  is within normal limits for age. Vascular: Major intracranial vascular flow voids are preserved. Suspected thrombus in the left PICA as seen on series 9, image 6 - the same segment which was nonvisualized by CTA. Skull and upper cervical spine: Negative. Normal bone marrow signal. Sinuses/Orbits: Left gaze deviation. Otherwise negative orbit soft tissues. Visualized paranasal sinuses and mastoids are stable and well  pneumatized. Other: Visible internal auditory structures appear normal. Negative scalp soft tissues. IMPRESSION: 1. Left PICA occlusion with patchy acute infarcts affecting the left PICA territory of the cerebellum and also the left lateral and central medulla. No associated hemorrhage or mass effect. 2. No other acute intracranial abnormality and otherwise mild for age nonspecific cerebral white matter signal changes. Electronically Signed   By: Odessa Fleming M.D.   On: 03/07/2016 07:15   Dg Swallowing Func-speech Pathology  Result Date: 03/07/2016 Objective Swallowing Evaluation: Type of Study: MBS-Modified Barium Swallow Study Patient Details Name: Jakaya Jacobowitz MRN: [id number] Date of Birth: 03-Aug-1950 Today's Date: 03/07/2016 Time: SLP Start Time (ACUTE ONLY): 1425-SLP Stop Time (ACUTE ONLY): 1446 SLP Time Calculation (min) (ACUTE ONLY): 21 min Past Medical History: Past Medical History: Diagnosis Date . CAD (coronary artery disease)   small NSTEMI 11/12: LHC 01/06/11:  LAD 90-95%, mD1 90%, pD2 50%,  pOM1 50%, RCA occluded,  EF > 65%;  CABG  12/12: L-LAD . Diabetes mellitus  . Glucose intolerance (impaired glucose tolerance)   Hemoglobin A1c 6.4 in 12/2010 . HTN (hypertension)   Echo 01/06/11: EF 65% . Hyperlipidemia  . Obesity  Past Surgical History: Past Surgical History: Procedure Laterality Date . ABDOMINAL HYSTERECTOMY   . APPENDECTOMY   . CORONARY ARTERY BYPASS GRAFT  01/12/2011  Procedure: CORONARY ARTERY BYPASS GRAFTING (CABG);  Surgeon: Alleen Borne, MD;  Location: [facility] OR;  Service: Open Heart Surgery;  Laterality: N/A;  coronary artery bypass graft times one using left internal mammary artery . Attempted endoscopic saphenous vein harvest . LEFT HEART CATHETERIZATION WITH CORONARY ANGIOGRAM N/A 01/06/2011  Procedure: LEFT HEART CATHETERIZATION WITH CORONARY ANGIOGRAM;  Surgeon: Rollene Rotunda, MD;  Location[facility] CATH LAB;  Service: Cardiovascular;  Laterality: N/A; HPI: 66 y.o.femalewith medical  history significant of coronary artery disease, diabetes, hypertension, hyperlipidemia, obesity, obstructive sleep apnea, presents to the emergency room with chief complaint of sudden onset vertigo and left-sided weakness. MRI showed left PICA occlusion with patchy acute infarcts affecting the left PCA territory of the cerebellum and also the left lateral and central medulla.66 y.o.femalewith medical history significant of coronary artery disease, diabetes, hypertension, hyperlipidemia, obesity, obstructive sleep apnea, presents to the emergency room with chief complaint of sudden onset vertigo and left-sided weakness. MRI showed left PICA occlusion with patchy acute infarcts affecting the left PCA territory of the cerebellum and also the left lateral and central medulla. Subjective: pt alert, hungry Assessment / Plan / Recommendation CHL IP CLINICAL IMPRESSIONS 03/07/2016 Therapy Diagnosis Mild pharyngeal phase dysphagia Clinical Impression Pt has a mild pharyngeal dysphagia characterized by impaired timing that results in silent penetration before the swallow with thin liquids. Min cues for use of a chin tuck are effective at increasing airway protection. Suspect that delayed throat clearing and coughing at bedside was due to either increased volume or depth of airway invasion. Recommend regular diet textures and thin liquids but with use of chin tuck.  Impact on safety and function Mild aspiration risk   CHL IP TREATMENT RECOMMENDATION 03/07/2016 Treatment Recommendations Therapy as outlined in treatment plan below   Prognosis 03/07/2016 Prognosis  for Safe Diet Advancement Good Barriers to Reach Goals -- Barriers/Prognosis Comment -- CHL IP DIET RECOMMENDATION 03/07/2016 SLP Diet Recommendations Regular solids;Thin liquid Liquid Administration via Cup;Straw Medication Administration Whole meds with puree Compensations Slow rate;Small sips/bites;Chin tuck Postural Changes Seated upright at 90 degrees   CHL IP OTHER  RECOMMENDATIONS 03/07/2016 Recommended Consults -- Oral Care Recommendations Oral care BID Other Recommendations --   CHL IP FOLLOW UP RECOMMENDATIONS 03/07/2016 Follow up Recommendations (No Data)   CHL IP FREQUENCY AND DURATION 03/07/2016 Speech Therapy Frequency (ACUTE ONLY) min 2x/week Treatment Duration 2 weeks      CHL IP ORAL PHASE 03/07/2016 Oral Phase WFL Oral - Pudding Teaspoon -- Oral - Pudding Cup -- Oral - Honey Teaspoon -- Oral - Honey Cup -- Oral - Nectar Teaspoon -- Oral - Nectar Cup -- Oral - Nectar Straw -- Oral - Thin Teaspoon -- Oral - Thin Cup -- Oral - Thin Straw -- Oral - Puree -- Oral - Mech Soft -- Oral - Regular -- Oral - Multi-Consistency -- Oral - Pill -- Oral Phase - Comment --  [facility] IP PHARYNGEAL PHASE 03/07/2016 Pharyngeal Phase Impaired Pharyngeal- Pudding Teaspoon -- Pharyngeal -- Pharyngeal- Pudding Cup -- Pharyngeal -- Pharyngeal- Honey Teaspoon -- Pharyngeal -- Pharyngeal- Honey Cup -- Pharyngeal -- Pharyngeal- Nectar Teaspoon -- Pharyngeal -- Pharyngeal- Nectar Cup -- Pharyngeal -- Pharyngeal- Nectar Straw -- Pharyngeal -- Pharyngeal- Thin Teaspoon -- Pharyngeal -- Pharyngeal- Thin Cup Delayed swallow initiation-pyriform sinuses;Compensatory strategies attempted (with notebox);Penetration/Aspiration before swallow Pharyngeal Material enters airway, remains ABOVE vocal cords and not ejected out Pharyngeal- Thin Straw Delayed swallow initiation-pyriform sinuses;Compensatory strategies attempted (with notebox) Pharyngeal -- Pharyngeal- Puree WFL Pharyngeal -- Pharyngeal- Mechanical Soft -- Pharyngeal -- Pharyngeal- Regular WFL Pharyngeal -- Pharyngeal- Multi-consistency -- Pharyngeal -- Pharyngeal- Pill -- Pharyngeal -- Pharyngeal Comment --  [facility] IP CERVICAL ESOPHAGEAL PHASE 03/07/2016 Cervical Esophageal Phase WFL Pudding Teaspoon -- Pudding Cup -- Honey Teaspoon -- Honey Cup -- Nectar Teaspoon -- Nectar Cup -- Nectar Straw -- Thin Teaspoon -- Thin Cup -- Thin Straw -- Puree -- Mechanical  Soft -- Regular -- Multi-consistency -- Pill -- Cervical Esophageal Comment -- No flowsheet data found. Maxcine Ham 03/07/2016, 4:14 PM Maxcine Ham, M.A. CCC-SLP [phone]               Assessment/Plan: Diagnosis:  Left cerebellar infarct Labs and images independently reviewed.  Records reviewed and summated above. Stroke: Continue secondary stroke prophylaxis and Risk Factor Modification listed below:   Antiplatelet therapy:   Blood Pressure Management:  Continue current medication with prn's with permisive HTN per primary team Statin Agent:   Prediabetes management:    1. Does the need for close, 24 hr/day medical supervision in concert with the patient's rehab needs make it unreasonable for this patient to be served in a less intensive setting? Yes  2. Co-Morbidities requiring supervision/potential complications: CAD (cont meds), super obesity (Body mass index is 55.87 kg/m., diet and exercise education, encourage weight loss to increase endurance and promote overall health), calcified aortic valve, diastolic dysfunction (monitor for signs/symptoms of fluid overload), HTN (monitor and provide prns in accordance with increased physical exertion and pain), prediabetes (Monitor in accordance with exercise and adjust meds as necessary) 3. Due to safety, disease management and patient education, does the patient require 24 hr/day rehab nursing? Potentially 4. Does the patient require coordinated care of a physician, rehab nurse, PT (1-2 hrs/day, 5 days/week) and OT (1-2 hrs/day, 5 days/week) to address physical and functional deficits in  the context of the above medical diagnosis(es)? Yes Addressing deficits in the following areas: balance, endurance, locomotion, strength, transferring, dressing, toileting and psychosocial support 5. Can the patient actively participate in an intensive therapy program of at least 3 hrs of therapy per day at least 5 days per week? Potentially 6. The  potential for patient to make measurable gains while on inpatient rehab is excellent 7. Anticipated functional outcomes upon discharge from inpatient rehab are supervision and min assist  with PT, min assist with OT, n/a with SLP. 8. Estimated rehab length of stay to reach the above functional goals is: 13-17 days. 9. Does the patient have adequate social supports and living environment to accommodate these discharge functional goals? Potentially 10. Anticipated D/C setting: Home 11. Anticipated post D/C treatments: HH therapy and Home excercise program 12. Overall Rehab/Functional Prognosis: good  RECOMMENDATIONS: This patient's condition is appropriate for continued rehabilitative care in the following setting: CIR if adequate caregiver support available upon discharge.  Patient has agreed to participate in recommended program. Yes Note that insurance prior authorization may be required for reimbursement for recommended care.  Comment: Rehab Admissions Coordinator to follow up.  Maryla MorrowAnkit Patel, MD, [place] DomFAAPMR Jacquelynn CreeLove, Pamela S, [place]PA-C 03/08/2016    Revision History                        Routing History

## 2016-03-10 NOTE — Progress Notes (Signed)
Nutrition Brief Note  RD acknowledge consult for diet education. Plans to visit patient early next week to provide diet education regarding a carbohydrate modified/heart healthy diet.   Roslyn SmilingStephanie Hilmar Moldovan, MS, RD, LDN Pager # [phone] After hours/ weekend pager # [phone]

## 2016-03-10 NOTE — Progress Notes (Signed)
Speech Language Pathology Treatment: Dysphagia  Patient Details Name: Megan Atkinson MRN: [id number] DOB: [birth year] Today's Date: 03/10/2016 Time: 3817-7116 SLP Time Calculation (min) (ACUTE ONLY): 10 min  Assessment / Plan / Recommendation Clinical Impression  Pt seen for swallowing treatment. Reviewed results of MBS and pt able to recall compensatory strategies recommended. Pt reports feeling that swallowing function is improving. Pt reports using chin tuck strategy with liquids but feels that "thicker liquids" such as orange juice are easier to swallow. Observed pt taking sips of thin liquid with and without chin tuck; no overt s/s of aspiration noted on any trials, even when pt instructed to take larger sips. Recommend continuing regular diet, thin liquids, meds whole in puree. Does not appear that pt needs to continue use of chin tuck strategy. SLP will sign off at this time as goals are met. Please re-consult if needs arise.   HPI HPI: 66 y.o.femalewith medical history significant of coronary artery disease, diabetes, hypertension, hyperlipidemia, obesity, obstructive sleep apnea, presents to the emergency room with chief complaint of sudden onset vertigo and left-sided weakness. MRI showed left PICA occlusion with patchy acute infarcts affecting the left PCA territory of the cerebellum and also the left lateral and central medulla.66 y.o.femalewith medical history significant of coronary artery disease, diabetes, hypertension, hyperlipidemia, obesity, obstructive sleep apnea, presents to the emergency room with chief complaint of sudden onset vertigo and left-sided weakness. MRI showed left PICA occlusion with patchy acute infarcts affecting the left PCA territory of the cerebellum and also the left lateral and central medulla.      SLP Plan  All goals met     Recommendations  Diet recommendations: Regular;Thin liquid Liquids provided via: Cup;Straw Medication Administration: Whole  meds with puree Supervision: Patient able to self feed Compensations: Slow rate;Small sips/bites Postural Changes and/or Swallow Maneuvers: Seated upright 90 degrees                Oral Care Recommendations: Oral care BID Follow up Recommendations: None Plan: All goals met       GO                Kern Reap, MA, CCC-SLP 03/10/2016, 8:44 AM [phone]

## 2016-03-10 NOTE — Clinical Social Work Note (Signed)
Patient admitting to CIR today.  CSW signing off.   Layia Walla, CSW [phone]  

## 2016-03-10 NOTE — Progress Notes (Signed)
Occupational Therapy Treatment Patient Details Name: Megan NielsenBeverley Atkinson MRN: [id number] DOB: May 04, 1950 Today's Date: 03/10/2016    History of present illness Pt is a 66 y.o. female who presents to ED with sudden vertigo and L sided weakness. CT revealed moderate area of inferior L cerebral infarction favored subacute and MRI revealed L PICA occlusion with patchy acute infarcts affecting L PICA territory of the cerebellum and L lateral and central medulla. PMH: coronary artery disease, diabetes, hypertension, hyperlipidemia, obesity, and obstructive sleep apnea.   OT comments  Pt making good, steady progress towards OT goals - continue plan of care for now. Pt mainly limited by generalized weakness. Pt tends to lean laterally left and requires mod to max cueing to lean towards the right. See below under ADL for information regarding this skilled intervention.    Follow Up Recommendations  CIR;Supervision/Assistance - 24 hour    Equipment Recommendations  Other (comment) (defer to next venue)    Recommendations for Other Services Rehab consult   Precautions / Restrictions Precautions Precautions: Fall Precaution Comments: L heavy pusher, L weakness Restrictions Weight Bearing Restrictions: No       Mobility Bed Mobility Overal bed mobility: Needs Assistance Bed Mobility: Supine to Sit     Supine to sit: Supervision     General bed mobility comments: Close supervision for safety. Cueing for technique, sequencing, and safety. No physical assistance needed for transition supine to sit.   Transfers Overall transfer level: Needs assistance Equipment used: Rolling walker (2 wheeled) Transfers: Sit to/from Stand Sit to Stand: +2 physical assistance;Mod assist         General transfer comment: Min assist +2 for sit to/from stand using STEDY. Cues for safety, technique, leaning more towards right. Pt tends to have a left lateral lean during sitting, standing, and transitional  changes.     Balance Overall balance assessment: Needs assistance Sitting-balance support: Feet supported;No upper extremity supported Sitting balance-Leahy Scale: Fair Sitting balance - Comments: able to sit EOB with no UE support   Standing balance support: During functional activity;No upper extremity supported Standing balance-Leahy Scale: Poor Standing balance comment: Pt with difficulty standing at sink in STEDY with no UE support, pt tends to fall towards the left. Pt only able to stand with no UE support ~3 seconds before needing to hold onto something.     ADL Overall ADL's : Needs assistance/impaired Eating/Feeding: Minimal assistance;Sitting   Grooming: Minimal assistance;Standing Grooming Details (indicate cue type and reason): using STEDY at sink Upper Body Bathing: Moderate assistance;Sitting Upper Body Bathing Details (indicate cue type and reason): Up to mod assist to maintain balance in sitting. Lower Body Bathing: Maximal assistance;Sit to/from stand Lower Body Bathing Details (indicate cue type and reason): sit to/from stand using STEDY Upper Body Dressing : Moderate assistance;Sitting Upper Body Dressing Details (indicate cue type and reason): Up to mod assist to maintain balance in sitting. Lower Body Dressing: Maximal assistance;Sit to/from stand Lower Body Dressing Details (indicate cue type and reason): sit to/from stand using STEDY Toilet Transfer: +2 for physical assistance;Maximal assistance;BSC;RW Toilet Transfer Details (indicate cue type and reason): Simulated. Pt performed sit to/from stand from recliner with mod assist +2.  Toileting- Clothing Manipulation and Hygiene: Total assistance;Sit to/from stand     Tub/Shower Transfer Details (indicate cue type and reason): Did not occur.    General ADL Comments: Pt able to complete sit to stand in STEDY with min assist +2 and stood at sink to complete grooming tasks with close supervision, but  cueing to lean  towards right instead of left. Pt then sat in recliner and was able to perform sit to stand from recliner with mod assist +2 and max assist +2 to take steps towards the door.             Cognition   Behavior During Therapy: WFL for tasks assessed/performed Overall Cognitive Status: Within Functional Limits for tasks assessed                 Pertinent Vitals/ Pain       Pain Assessment: No/denies pain  Frequency  Min 3X/week    Progress Toward Goals  OT Goals(current goals can now be found in the care plan section)  Progress towards OT goals: Progressing toward goals  Acute Rehab OT Goals Patient Stated Goal: to get better OT Goal Formulation: With patient Time For Goal Achievement: 03/21/16 Potential to Achieve Goals: Good  Plan Discharge plan remains appropriate    Co-evaluation    PT/OT/SLP Co-Evaluation/Treatment: Yes Reason for Co-Treatment: For patient/therapist safety;Complexity of the patient's impairments (multi-system involvement);To address functional/ADL transfers   OT goals addressed during session: ADL's and self-care      End of Session Equipment Utilized During Treatment: Gait belt;Rolling walker (STEDY)   Activity Tolerance Patient tolerated treatment well   Patient Left in chair;with call bell/phone within reach;with chair alarm set     Time: 1610-9604 OT Time Calculation (min): 29 min  Charges: OT General Charges $OT Visit: 1 Procedure OT Treatments $Self Care/Home Management : 8-22 mins  Edwin Cap , MS, OTR/L, [place] Pager: [phone] 03/10/2016, 1:52 PM

## 2016-03-10 NOTE — Progress Notes (Signed)
[name] [facility] Admission Coordinator Signed Physical Medicine and Rehabilitation  PMR Pre-admission Date of Service: 03/10/2016 10:01 AM  Related encounter: ED to Hosp-Admission (Discharged) from 03/06/2016 in [facility] 75M NEURO MEDICAL       [] Hide copied text PMR Admission Coordinator Pre-Admission Assessment  Patient: Megan Atkinson is an 66 y.o., female MRN: [id number] DOB: October 12, 1950 Height: 5\' 7"  (170.2 cm) Weight: (!) 161.8 kg (356 lb 11.3 oz)                                                                                                                                                  Insurance Information HMO: X    PPO:      PCP:      IPA:      80/20:      OTHER:  PRIMARY: BCBS Medicare       Policy#: [id number]      Subscriber: Self CM Name: Megan Atkinson       Phone#: [phone]     Fax#: [phone] Pre-Cert#:                              Employer: Full Time Benefits:  Phone #: [phone]     Name: online  Eff. Date: 02/07/16     Deduct: $0      Out of Pocket Max: $5,500      Life Max: N/A CIR: $300 a day, days 1-6; $0 a day, days 6+      SNF: $0 a day, days 1-20; $167.50 a day, days 21-100 Outpatient: PT/OT/SLP      Co-Pay: $40 visit  Home Health: PT/OT/SLP      Co-Pay: none DME: 80%     Co-Pay: 20%  Providers: in network   Medicaid Application Date:       Case Manager:  Disability Application Date:       Case Worker:   Emergency Actuary Information    Name Relation Home Work Mobile   [place] Son [phone]       Current Medical History  Patient Admitting Diagnosis: Left cerebellar infarct  History of Present Illness: [name] a 66 y.o.femalewith history of CAD, HTN, morbid obesity who was admitted on 03/06/16 with vertigo and left sided weakness with numbness. CT head reviewed, showing left cerebellar infarct, thought to be subacute. TPA not administered. CTA head/neck no significant  stenosis in neck and moderate focal stenosis left VA. 2D echo with eF 60-65% with mild to moderately calcified aortic valve and grade I diastolic dysfunction. BLE dopplers negative for DVT. Dr. Roda Shutters felt that stroke due to atherosclerosis and multiple risk factors. He recommended ASA/Plavix for three months followed by plavix alone. MBS done 1/30 revealing mild pharyngeal dysphagia with penetration  of thin therefore chin tuck with thin liquids recommended. Patient with resultant visual deficits with vestibular issues, difficulty with weight shifting, stooped posture and left lateral lean with ambulation. Therapy ongoing andCIR recommended for follow up therapy.   NIH Total: 4  Past Medical History      Past Medical History:  Diagnosis Date  . CAD (coronary artery disease)    small NSTEMI 11/12: LHC 01/06/11:  LAD 90-95%, mD1 90%, pD2 50%,  pOM1 50%, RCA occluded,  EF > 65%;  CABG  12/12: L-LAD  . Diabetes mellitus   . Glucose intolerance (impaired glucose tolerance)    Hemoglobin A1c 6.4 in 12/2010  . HTN (hypertension)    Echo 01/06/11: EF 65%  . Hyperlipidemia   . Obesity     Family History  family history is not on file.  Prior Rehab/Hospitalizations:  Has the patient had major surgery during 100 days prior to admission? No  Current Medications   Current Facility-Administered Medications:  .  acetaminophen (TYLENOL) tablet 650 mg, 650 mg, Oral, Q4H PRN **OR** acetaminophen (TYLENOL) solution 650 mg, 650 mg, Per Tube, Q4H PRN **OR** acetaminophen (TYLENOL) suppository 650 mg, 650 mg, Rectal, Q4H PRN, Leatha Gilding, MD .  aspirin EC tablet 325 mg, 325 mg, Oral, Daily, Marvel Plan, MD, 325 mg at 03/10/16 0901 .  atorvastatin (LIPITOR) tablet 80 mg, 80 mg, Oral, q1800, Marvel Plan, MD, 80 mg at 03/09/16 1646 .  clopidogrel (PLAVIX) tablet 75 mg, 75 mg, Oral, Daily, Marvel Plan, MD, 75 mg at 03/10/16 0901 .  enoxaparin (LOVENOX) injection 40 mg, 40 mg, Subcutaneous,  Q24H, Leatha Gilding, MD, 40 mg at 03/09/16 2045 .  insulin aspart (novoLOG) injection 0-9 Units, 0-9 Units, Subcutaneous, TID WC, Costin Otelia Sergeant, MD, 1 Units at 03/09/16 1657 .  ondansetron (ZOFRAN) injection 4 mg, 4 mg, Intravenous, Q8H PRN, Leatha Gilding, MD, 4 mg at 03/06/16 2259  Patients Current Diet: Diet Carb Modified Fluid consistency: Thin; Room service appropriate? Yes  Precautions / Restrictions Precautions Precautions: Fall Precaution Comments: L heavy pusher, L weakness Restrictions Weight Bearing Restrictions: No   Has the patient had 2 or more falls or a fall with injury in the past year?No  Prior Activity Level Community (5-7x/wk): Prior to admission patient was fully independent.  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    Home Assistive Devices / Equipment Home Assistive Devices/Equipment: None Home Equipment: Walker - 2 wheels  Prior Device Use: Indicate devices/aids used by the patient prior to current illness, exacerbation or injury? None of the above  Prior Functional Level Prior Function Level of Independence: Independent Comments: Has walker but does not use this. 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  Self Care: Did the patient need help bathing, dressing, using the toilet or eating? Independent  Indoor Mobility: Did the patient need assistance with walking from room to room (with or without device)? Independent  Stairs: Did the patient need assistance with internal or external stairs (with or without device)? Independent  Functional Cognition: Did the patient need help planning regular tasks such as shopping or remembering to take medications? Independent  Current Functional Level Cognition  Arousal/Alertness: Awake/alert Overall Cognitive Status: Within Functional Limits for tasks assessed Orientation Level: Oriented X4 General Comments: Pt able  to engage and follow commands with various tasks performed.  Memory: Appears intact Awareness: Appears intact Problem Solving: Appears intact Safety/Judgment: Appears intact  Extremity Assessment (includes Sensation/Coordination)  Upper Extremity Assessment: Defer to OT evaluation LUE Deficits / Details: Grossly weak as compared to R UE. AROM WFL. Noted decreased proprioception during movements. LUE Sensation: decreased proprioception LUE Coordination: decreased gross motor  Lower Extremity Assessment: LLE deficits/detail LLE Deficits / Details: weakness, unable to move off bed independently; limited supporting in stance    ADLs  Overall ADL's : Needs assistance/impaired Grooming: Sitting, Minimal assistance Upper Body Bathing: Moderate assistance, Sitting Upper Body Bathing Details (indicate cue type and reason): Up to mod assist to maintain balance in sitting. Lower Body Bathing: Maximal assistance, Sitting/lateral leans Upper Body Dressing : Moderate assistance, Sitting Lower Body Dressing: Maximal assistance, Sitting/lateral leans Toilet Transfer: +2 for physical assistance, Maximal assistance, BSC Toilet Transfer Details (indicate cue type and reason): Simulated using Stedy. Attempted sit<>stand with max assist +2 with RW and unable to complete Toileting- Clothing Manipulation and Hygiene: Total assistance, Sit to/from stand General ADL Comments: Pt able to complete simulated toilet transfer wtih Stedy and max assist of 2. Educated pt and son on ADL strategies during recovery.    Mobility  Overal bed mobility: Needs Assistance Bed Mobility: Supine to Sit Supine to sit: Min assist General bed mobility comments: pt in chair upon arrival    Transfers  Overall transfer level: Needs assistance Equipment used: Rolling walker (2 wheeled) Transfer via Lift Equipment: Stedy Transfers: Sit to/from Stand Sit to Stand: +2 physical assistance, Mod assist General transfer  comment: Pt required cuing for proper hand placement and mod A +2 for boost into standing from chair x2.    Ambulation / Gait / Stairs / Wheelchair Mobility  Ambulation/Gait Ambulation/Gait assistance: Mod assist, +2 physical assistance (3rd person for w/c follow) Ambulation Distance (Feet): 8 Feet Assistive device: Rolling walker (2 wheeled) Gait Pattern/deviations: Trunk flexed, Decreased weight shift to right, Decreased step length - right, Decreased step length - left General Gait Details: Pt required two trials due to first attempt being limited by fear and pt requesting to sit down. Second attempt, pt was able to walk about 10 feet with mod A +3 for w/c follow and patient/ staff safety. Pt unable to maintain upright posture without cuing and assistance.; mod A +2 to assist compenstation of left lateral lean.  Gait velocity: slow, fearful    Posture / Balance Dynamic Sitting Balance Sitting balance - Comments: able to sit EOB with no UE support Balance Overall balance assessment: Needs assistance Sitting-balance support: Bilateral upper extremity supported, Feet supported Sitting balance-Leahy Scale: Fair Sitting balance - Comments: able to sit EOB with no UE support Standing balance support: Bilateral upper extremity supported, During functional activity Standing balance-Leahy Scale: Poor Standing balance comment: mod A of 2 in standing with walker, heavy lean to L    Special needs/care consideration BiPAP/CPAP: Yes, CPAP CPM: No Continuous Drip IV: No Dialysis: No         Life Vest: No Oxygen: No Special Bed: No Trach Size: No Wound Vac (area): No       Skin: WDL, dry                               Bowel mgmt: 03/06/16 Bladder mgmt: Patient reports leakage if bladder is very full Diabetic mgmt: HgbA1c6.3      Previous Home Environment Living Arrangements: Alone  Lives With: Alone Available Help at Discharge: Family, Available 24 hours/day, Available  PRN/intermittently, Friend(s) Type of Home: House Home  Layout: Two level, 1/2 bath on main level (plans to stay on main level) Home Access: Stairs to enter Entrance Stairs-Number of Steps: 6 (but reports level entry at front Bathroom Shower/Tub: Engineer, manufacturing systems: Standard Home Care Services: No  Discharge Living Setting Plans for Discharge Living Setting: Patient's home, Other (Comment) (lived alone PTA; spouse plans to assist 24/7 upon dischage ) Type of Home at Discharge: House Discharge Home Layout: Two level, Able to live on main level with bedroom/bathroom (PTA bed and bath were upstairs, but can stay on main floor ) Alternate Level Stairs-Rails: Right Alternate Level Stairs-Number of Steps: 8 Discharge Home Access: Level entry Discharge Bathroom Shower/Tub: Other (comment) (sink only on main level, walking shower or tub upstairs ) Discharge Bathroom Toilet: Standard Discharge Bathroom Accessibility: Yes How Accessible: Accessible via walker Does the patient have any problems obtaining your medications?: No  Social/Family/Support Systems Patient Roles: Parent, Spouse Contact Information: Spouse: Noelie Renfrow [phone] Anticipated Caregiver: Spoke with spouse who agreed  Anticipated Caregiver's Contact Information: see above Ability/Limitations of Caregiver: none per his report Caregiver Availability: 24/7 Discharge Plan Discussed with Primary Caregiver: Yes Is Caregiver In Agreement with Plan?: Yes Does Caregiver/Family have Issues with Lodging/Transportation while Pt is in Rehab?: No  Goals/Additional Needs Patient/Family Goal for Rehab: PT Supervision-Min; OT Min; SLP Mod I  Expected length of stay: 13-17 days  Cultural Considerations: None Dietary Needs: Carb Mod  Equipment Needs: TBD Special Service Needs: None Additional Information: Patient also has 2 sons who live [place]  Pt/Family Agrees to Admission and willing to  participate: Yes Program Orientation Provided & Reviewed with Pt/Caregiver Including Roles  & Responsibilities: Yes Additional Information Needs: None Information Needs to be Provided By: N/A  Decrease burden of Care through IP rehab admission: No  Possible need for SNF placement upon discharge: No  Patient Condition: This patient's medical and functional status has changed since the consult dated 03/08/16 in which the Rehabilitation Physician determined and documented that the patient was potentially appropriate for intensive rehabilitative care in an inpatient rehabilitation facility. Issues have been addressed and update has been discussed with Dr. Riley Kill and patient now appropriate for inpatient rehabilitation. Will admit to inpatient rehab today.   Preadmission Screen Completed By:  [name], 03/10/2016 10:17 AM ______________________________________________________________________   Discussed status with Dr. Riley Kill on 03/10/16 at 1015 and received telephone approval for admission today.  Admission Coordinator:  [name], time 1015/Date 03/10/16       Cosigned by: Ranelle Oyster, MD at 03/10/2016 11:05 AM

## 2016-03-10 NOTE — Care Management Important Message (Signed)
Important Message  Patient Details  Name: Megan NielsenBeverley Atkinson MRN: [id number] Date of Birth: 08-Sep-1950   Medicare Important Message Given:       Dorena Bodoris Janitza Revuelta 03/10/2016, 2:02 PM

## 2016-03-10 NOTE — Progress Notes (Signed)
Tx performed and documented by SPTA under direct guidance and supervision of PTA at all times.  Charting reviewed for accuracy and depicts tx performed and services provided.  Megan Atkinson, PTA pager [phone]  Physical Therapy Treatment Patient Details Name: Megan NielsenBeverley Huizinga MRN: [id number] DOB: September 06, 1950 Today's Date: 03/10/2016    History of Present Illness Pt is a 66 y.o. female who presents to ED with sudden vertigo and L sided weakness. CT revealed moderate area of inferior L cerebral infarction favored subacute and MRI revealed L PICA occlusion with patchy acute infarcts affecting L PICA territory of the cerebellum and L lateral and central medulla. PMH: coronary artery disease, diabetes, hypertension, hyperlipidemia, obesity, and obstructive sleep apnea.    PT Comments    Pt continues to show improvement within each treatment but struggles to overcome her fear of falling during functional tasks. Sit to stand transfers from EOB to standing are improving, requiring min +2 to power up in sara stedy into standing initially but becomes mod A +2 when transfers with RW (bariatric). She also requires less to minimal cuing with sitting balance at EOB. She ambulated 15 feet with max A +2 with +3 for chair follow and requires continuous cuing for proper gait sequencing and leaning away from left side to improve safety awareness. Pt being d/c to CIR today to increase her independence prior to returning home with family.    Follow Up Recommendations  CIR     Equipment Recommendations  Other (comment) (TBA)    Recommendations for Other Services       Precautions / Restrictions Precautions Precautions: Fall Precaution Comments: heavy pusher to Left, L sided weakness Restrictions Weight Bearing Restrictions: No    Mobility  Bed Mobility Overal bed mobility: Needs Assistance Bed Mobility: Supine to Sit     Supine to sit: Supervision     General bed mobility comments: pt required  increased time and use of rail to get to EOB but able to perform with close supervision for safety. Cuing for technique, LE sequencing and proper hand placement  Transfers Overall transfer level: Needs assistance Equipment used: Rolling walker (2 wheeled) (stedy) Transfers: Sit to/from Stand Sit to Stand: +2 physical assistance;Mod assist         General transfer comment: Pt required min A +2 from elevated surface of Stedy to standing x2, Mod A +2 to power up from normal height to standing x2. Cuing required for proper hand placement and to correct left lateral lean during entire sequence. Mod assist +2 with RW with increased time.    Ambulation/Gait Ambulation/Gait assistance: +2 physical assistance;Max assist Ambulation Distance (Feet): 15 Feet Assistive device: Rolling walker (2 wheeled) Gait Pattern/deviations: Trunk flexed;Decreased weight shift to right;Decreased step length - right;Decreased step length - left;Narrow base of support     General Gait Details: Pt requires max A +2 with third person for chair follow. Pt continues to limit her gains with fear but made it to doorway in room. Heavy cuing to lean towards R, proper gait sequencing and upright posture throughout.   Stairs            Wheelchair Mobility    Modified Rankin (Stroke Patients Only) Modified Rankin (Stroke Patients Only) Pre-Morbid Rankin Score: No symptoms Modified Rankin: Moderately severe disability     Balance Overall balance assessment: Needs assistance Sitting-balance support: Feet supported;No upper extremity supported Sitting balance-Leahy Scale: Fair Sitting balance - Comments: able to sit EOB with no UE support; can perform dynamic sitting balance  with cuing for leaning towards right side. Unable to maintain posture in midline   Standing balance support: Single extremity supported;During functional activity Standing balance-Leahy Scale: Poor Standing balance comment: Able to stand in  static but difficult to maintain without UE support. Pt experiences LOB to left when not holding on to something.                     Cognition Arousal/Alertness: Awake/alert Behavior During Therapy: WFL for tasks assessed/performed Overall Cognitive Status: Within Functional Limits for tasks assessed                      Exercises      General Comments        Pertinent Vitals/Pain Pain Assessment: No/denies pain    Home Living                      Prior Function            PT Goals (current goals can now be found in the care plan section) Acute Rehab PT Goals Patient Stated Goal: to go to CIR PT Goal Formulation: With patient Potential to Achieve Goals: Good Progress towards PT goals: Progressing toward goals    Frequency    Min 4X/week      PT Plan Current plan remains appropriate    Co-evaluation PT/OT/SLP Co-Evaluation/Treatment: Yes Reason for Co-Treatment: For patient/therapist safety;Complexity of the patient's impairments (multi-system involvement) PT goals addressed during session: Mobility/safety with mobility;Balance OT goals addressed during session: ADL's and self-care     End of Session Equipment Utilized During Treatment: Gait belt Activity Tolerance: Patient tolerated treatment well;Other (comment) (limited by fear of falling)       Time: 1191-4782 PT Time Calculation (min) (ACUTE ONLY): 30 min  Charges:  $Gait Training: 8-22 mins                    G Codes:      [facility] 03/26/16, 2:18 PM Kerrin Mo, [place] Pager [phone]

## 2016-03-10 NOTE — Progress Notes (Addendum)
Inpatient Rehabilitation  I await insurance decision for today, but am hopeful.  I have a bed available for this patient today and will update the team as soon as I hear from insurance.  Please call with questions.   Update: I have insurance approval, medical clearance, and a bed available.  Will plan to admit patient today and have notified team. Please call with questions.   Charlane FerrettiMelissa Phoebe Marter, M.A., CCC/SLP Admission Coordinator  [facility]  Cell [phone]

## 2016-03-10 NOTE — Progress Notes (Signed)
Pt arrived to unit at 1500 in bed. Rehab booklet and process explained to pt. Safety plan reviewed with verbal understanding. Pt A&Ox4 with call bell within reach.

## 2016-03-10 NOTE — Progress Notes (Signed)
Patient information reviewed and entered into eRehab system by Norely Schlick, RN, CRRN, PPS Coordinator.  Information including medical coding and functional independence measure will be reviewed and updated through discharge.     Per nursing patient was given "Data Collection Information Summary for Patients in Inpatient Rehabilitation Facilities with attached "Privacy Act Statement-Health Care Records" upon admission.  

## 2016-03-10 NOTE — Discharge Summary (Signed)
Physician Discharge Summary  Miria Cappelli ZOX:[id number] DOB: [birth year] DOA: 03/06/2016  PCP: Sanda Linger, MD  Admit date: 03/06/2016 Discharge date: 03/10/2016  Admitted From: Home Disposition:  CIR  Recommendations for Outpatient Follow-up:  1. Follow up with PCP in 2-3 weeks 2. Follow up with Neurology as scheduled 3. Recommend 3 days of bactrim DS for UTI  Discharge Condition:Stable CODE STATUS:Full Diet recommendation: Diabetic   Brief/Interim Summary: 66 y.o.femalewith medical history significant of coronary artery disease, diabetes, hypertension, hyperlipidemia, obesity, obstructive sleep apnea, presents to the emergency room with chief complaint of sudden onset vertigo and left-sided weakness early on the morning of presentation  Acute CVA - Patient underwent an MRI which showed patchy acute infarcts in the left PICA territory of the cerebellum, as well as left lateral and central medulla. She also has left PICA occlusion.  - Neurology consulted and is following - complete workup with 2-D echo, lipid panel, hemoglobin A1c, PT/OT/SLP.  - She failed swallow evaluation at bedside in the emergency room - Therapy recommendations for CIR. Have consulted CIR - Awaiting placement - Neurology recommends dual antiplatelet therapy with 325mg  ASA with plavix x 3 months, then plavix monotherapy afterwards  Hypertension  - Initially allowed permissive hypertension - BP remains stable - Recommend continued home metoprolol on discharge  Diabetes mellitus  - doesn't appear to be on medications, may be diet controlled, continue on sliding scale - hgb a1c of 6.3  Hyperlipidemia - continue statin - Stable at present  Obstructive sleep apnea - CPAP daily at bedtime - Presently stable  CAD - Without chest pain, on aspirin.  - Stable at present - No wall motion abnormality on echo, reviewed  UTI - UA on 03/07/16 with findings of leuks, nitrates, TNTC WBC's, many  bacteria - Remains afebrile, no leukocytosis - Recommend empiric treatment with bactrim DS x 3 days  Discharge Diagnoses:  Active Problems:   CAD (coronary artery disease)   Hypertension   Diabetes mellitus (HCC)   Hyperlipidemia   OSA (obstructive sleep apnea)   Morbid obesity (HCC)   CVA (cerebral vascular accident) (HCC)   Acute cystitis without hematuria   Super obese (HCC)   Prediabetes   Ataxia, post-stroke    Discharge Instructions  Discharge Instructions    Ambulatory referral to Neurology    Complete by:  As directed    Follow up with NP Darrol Angel at [facility] in about 2 month2. Thanks.     Allergies as of 03/10/2016      Reactions   Lisinopril    cough   Metformin And Related Diarrhea   A lot of fluid loss from bowel      Medication List    STOP taking these medications   furosemide 20 MG tablet Commonly known as:  LASIX     TAKE these medications   aspirin 325 MG tablet Take 325 mg by mouth daily.   calcium-vitamin D 500-200 MG-UNIT tablet Take 1 tablet by mouth daily.   clopidogrel 75 MG tablet Commonly known as:  PLAVIX Take 1 tablet (75 mg total) by mouth daily. Start taking on:  03/11/2016   Ginkgo 60 MG Tabs Take 1 tablet by mouth daily. For memory   glucosamine-chondroitin 500-400 MG tablet Take 1 tablet by mouth daily.   Green Tea (Camillia sinensis) 1000 MG Tabs Take 1 tablet by mouth daily.   metoprolol 50 MG tablet Commonly known as:  LOPRESSOR TAKE ONE TABLET BY MOUTH TWICE DAILY   pravastatin 80 MG tablet  Commonly known as:  PRAVACHOL TAKE ONE TABLET BY MOUTH ONCE DAILY   sulfamethoxazole-trimethoprim 800-160 MG tablet Commonly known as:  BACTRIM DS,SEPTRA DS Take 1 tablet by mouth 2 (two) times daily.   VITAMIN B-12 IJ weekly      Follow-up Information    Nilda Riggs, NP. Schedule an appointment as soon as possible for a visit in 6 week(s).   Specialty:  Family Medicine Why:  stroke follow up Contact  information: [address] [place] [address] [phone]        Sanda Linger, MD. Schedule an appointment as soon as possible for a visit in 2 week(s).   Specialty:  Internal Medicine Contact information: [address] [place] [address] [phone]          Allergies  Allergen Reactions  . Lisinopril     cough  . Metformin And Related Diarrhea    A lot of fluid loss from bowel    Consultations:  Neurology  CIR  Procedures/Studies: Ct Angio Head W/cm &/or Wo Cm  Result Date: 03/07/2016 CLINICAL DATA:  Follow-up stroke. History of hypertension, hyperlipidemia and diabetes. EXAM: CT ANGIOGRAPHY HEAD AND NECK TECHNIQUE: Multidetector CT imaging of the head and neck was performed using the standard protocol during bolus administration of intravenous contrast. Multiplanar CT image reconstructions and MIPs were obtained to evaluate the vascular anatomy. Carotid stenosis measurements (when applicable) are obtained utilizing NASCET criteria, using the distal internal carotid diameter as the denominator. CONTRAST:  50 cc Isovue 370 COMPARISON:  CT HEAD March 06, 2016 FINDINGS: CTA NECK AORTIC ARCH: Normal appearance of the thoracic arch, normal branch pattern. Mild calcific atherosclerosis of aortic arch. The origins of the innominate, left Common carotid artery and subclavian artery are widely patent. RIGHT CAROTID SYSTEM: Common carotid artery is widely patent, coursing in a straight line fashion. Normal appearance of the carotid bifurcation without hemodynamically significant stenosis by NASCET criteria. Normal appearance of the included internal carotid artery. LEFT CAROTID SYSTEM: Common carotid artery is widely patent, coursing in a straight line fashion. Normal appearance of the carotid bifurcation without hemodynamically significant stenosis by NASCET criteria. Normal appearance of the included internal carotid artery. VERTEBRAL ARTERIES:Left  vertebral artery is dominant. Normal appearance of the vertebral arteries, which appear widely patent. SKELETON: No acute osseous process though bone windows have not been submitted. Multiple bilateral subcentimeter retro pectoral lymph nodes. Patient is edentulous. Moderate degenerative change of the cervical spine. OTHER NECK: Soft tissues of the neck are non-acute though, not tailored for evaluation. Status post median sternotomy. CTA HEAD ANTERIOR CIRCULATION: Normal appearance of the cervical internal carotid arteries, petrous, cavernous and supra clinoid internal carotid arteries. Widely patent anterior communicating artery. Normal appearance of the anterior and middle cerebral arteries. No large vessel occlusion, hemodynamically significant stenosis, dissection, luminal irregularity, contrast extravasation or aneurysm. POSTERIOR CIRCULATION: Moderate focal stenosis LEFT V4 segment due to eccentric calcific atherosclerosis. Normal appearance of the vertebrobasilar junction and basilar artery, as well as main branch vessels with exception of LEFT posterior-inferior cerebellar artery. Normal appearance of the posterior cerebral arteries, mild stenosis RIGHT P1 origin. Small RIGHT posterior communicating artery present. No large vessel occlusion, hemodynamically significant stenosis, dissection, luminal irregularity, contrast extravasation or aneurysm. VENOUS SINUSES: Major dural venous sinuses are patent though not tailored for evaluation on this angiographic examination. ANATOMIC VARIANTS: None. DELAYED PHASE: Not performed. IMPRESSION: CTA NECK: No hemodynamically significant stenosis or acute vascular process in the neck. CTA HEAD: Poor visualization of  LEFT posterior-inferior cerebellar artery, possibly occluded. Moderate focal stenosis LEFT vertebral artery. No emergent large vessel occlusion or severe stenosis. Electronically Signed   By: Awilda Metro M.D.   On: 03/07/2016 01:35   Ct Angio Neck W/cm  &/or Wo/cm  Result Date: 03/07/2016 CLINICAL DATA:  Follow-up stroke. History of hypertension, hyperlipidemia and diabetes. EXAM: CT ANGIOGRAPHY HEAD AND NECK TECHNIQUE: Multidetector CT imaging of the head and neck was performed using the standard protocol during bolus administration of intravenous contrast. Multiplanar CT image reconstructions and MIPs were obtained to evaluate the vascular anatomy. Carotid stenosis measurements (when applicable) are obtained utilizing NASCET criteria, using the distal internal carotid diameter as the denominator. CONTRAST:  50 cc Isovue 370 COMPARISON:  CT HEAD March 06, 2016 FINDINGS: CTA NECK AORTIC ARCH: Normal appearance of the thoracic arch, normal branch pattern. Mild calcific atherosclerosis of aortic arch. The origins of the innominate, left Common carotid artery and subclavian artery are widely patent. RIGHT CAROTID SYSTEM: Common carotid artery is widely patent, coursing in a straight line fashion. Normal appearance of the carotid bifurcation without hemodynamically significant stenosis by NASCET criteria. Normal appearance of the included internal carotid artery. LEFT CAROTID SYSTEM: Common carotid artery is widely patent, coursing in a straight line fashion. Normal appearance of the carotid bifurcation without hemodynamically significant stenosis by NASCET criteria. Normal appearance of the included internal carotid artery. VERTEBRAL ARTERIES:Left vertebral artery is dominant. Normal appearance of the vertebral arteries, which appear widely patent. SKELETON: No acute osseous process though bone windows have not been submitted. Multiple bilateral subcentimeter retro pectoral lymph nodes. Patient is edentulous. Moderate degenerative change of the cervical spine. OTHER NECK: Soft tissues of the neck are non-acute though, not tailored for evaluation. Status post median sternotomy. CTA HEAD ANTERIOR CIRCULATION: Normal appearance of the cervical internal carotid  arteries, petrous, cavernous and supra clinoid internal carotid arteries. Widely patent anterior communicating artery. Normal appearance of the anterior and middle cerebral arteries. No large vessel occlusion, hemodynamically significant stenosis, dissection, luminal irregularity, contrast extravasation or aneurysm. POSTERIOR CIRCULATION: Moderate focal stenosis LEFT V4 segment due to eccentric calcific atherosclerosis. Normal appearance of the vertebrobasilar junction and basilar artery, as well as main branch vessels with exception of LEFT posterior-inferior cerebellar artery. Normal appearance of the posterior cerebral arteries, mild stenosis RIGHT P1 origin. Small RIGHT posterior communicating artery present. No large vessel occlusion, hemodynamically significant stenosis, dissection, luminal irregularity, contrast extravasation or aneurysm. VENOUS SINUSES: Major dural venous sinuses are patent though not tailored for evaluation on this angiographic examination. ANATOMIC VARIANTS: None. DELAYED PHASE: Not performed. IMPRESSION: CTA NECK: No hemodynamically significant stenosis or acute vascular process in the neck. CTA HEAD: Poor visualization of LEFT posterior-inferior cerebellar artery, possibly occluded. Moderate focal stenosis LEFT vertebral artery. No emergent large vessel occlusion or severe stenosis. Electronically Signed   By: Awilda Metro M.D.   On: 03/07/2016 01:35   Mr Brain Wo Contrast  Result Date: 03/07/2016 CLINICAL DATA:  67 year old female code stroke, left side weakness and numbness. Left cerebellar infarct on presentation head CT 03/06/2016. Suspicion of left PICA occlusion on CTA head 0135 hours today. Initial encounter. EXAM: MRI HEAD WITHOUT CONTRAST TECHNIQUE: Multiplanar, multiecho pulse sequences of the brain and surrounding structures were obtained without intravenous contrast. COMPARISON:  CTA head and neck 0135 hours today. Head CT without contrast 03/06/2016 FINDINGS: Brain:  Patchy and confluent restricted diffusion in the inferior left cerebellum and also affecting the left lateral and central medulla (series 3, image 11). Associated T2 and  FLAIR hyperintensity. No associated hemorrhage. No significant mass effect. No other restricted diffusion. No midline shift, mass effect, evidence of mass lesion, ventriculomegaly, extra-axial collection or acute intracranial hemorrhage. Cervicomedullary junction and pituitary are within normal limits. Dural calcifications, especially along the superior interhemispheric fissure. Mild for age nonspecific periventricular white matter T2 and FLAIR hyperintensity. No cortical encephalomalacia. No definite chronic cerebral blood products. Cerebral volume is within normal limits for age. Vascular: Major intracranial vascular flow voids are preserved. Suspected thrombus in the left PICA as seen on series 9, image 6 - the same segment which was nonvisualized by CTA. Skull and upper cervical spine: Negative. Normal bone marrow signal. Sinuses/Orbits: Left gaze deviation. Otherwise negative orbit soft tissues. Visualized paranasal sinuses and mastoids are stable and well pneumatized. Other: Visible internal auditory structures appear normal. Negative scalp soft tissues. IMPRESSION: 1. Left PICA occlusion with patchy acute infarcts affecting the left PICA territory of the cerebellum and also the left lateral and central medulla. No associated hemorrhage or mass effect. 2. No other acute intracranial abnormality and otherwise mild for age nonspecific cerebral white matter signal changes. Electronically Signed   By: Odessa FlemingH  Hall M.D.   On: 03/07/2016 07:15   Dg Swallowing Func-speech Pathology  Result Date: 03/07/2016 Objective Swallowing Evaluation: Type of Study: MBS-Modified Barium Swallow Study Patient Details Name: Sunnie NielsenBeverley Wiland MRN: [id number] Date of Birth: 12-04-1950 Today's Date: 03/07/2016 Time: SLP Start Time (ACUTE ONLY): 1425-SLP Stop Time (ACUTE  ONLY): 1446 SLP Time Calculation (min) (ACUTE ONLY): 21 min Past Medical History: Past Medical History: Diagnosis Date . CAD (coronary artery disease)   small NSTEMI 11/12: LHC 01/06/11:  LAD 90-95%, mD1 90%, pD2 50%,  pOM1 50%, RCA occluded,  EF > 65%;  CABG  12/12: L-LAD . Diabetes mellitus  . Glucose intolerance (impaired glucose tolerance)   Hemoglobin A1c 6.4 in 12/2010 . HTN (hypertension)   Echo 01/06/11: EF 65% . Hyperlipidemia  . Obesity  Past Surgical History: Past Surgical History: Procedure Laterality Date . ABDOMINAL HYSTERECTOMY   . APPENDECTOMY   . CORONARY ARTERY BYPASS GRAFT  01/12/2011  Procedure: CORONARY ARTERY BYPASS GRAFTING (CABG);  Surgeon: Alleen BorneBryan K [name], MD;  [facility];  Service: Open Heart Surgery;  Laterality: N/A;  coronary artery bypass graft times one using left internal mammary artery . Attempted endoscopic saphenous vein harvest . LEFT HEART CATHETERIZATION WITH CORONARY ANGIOGRAM N/A 01/06/2011  Procedure: LEFT HEART CATHETERIZATION WITH CORONARY ANGIOGRAM;  Surgeon: Rollene RotundaJames Hochrein, MD;  [facility];  Service: Cardiovascular;  Laterality: N/A; HPI: 66 y.o.femalewith medical history significant of coronary artery disease, diabetes, hypertension, hyperlipidemia, obesity, obstructive sleep apnea, presents to the emergency room with chief complaint of sudden onset vertigo and left-sided weakness. MRI showed left PICA occlusion with patchy acute infarcts affecting the left PCA territory of the cerebellum and also the left lateral and central medulla.66 y.o.femalewith medical history significant of coronary artery disease, diabetes, hypertension, hyperlipidemia, obesity, obstructive sleep apnea, presents to the emergency room with chief complaint of sudden onset vertigo and left-sided weakness. MRI showed left PICA occlusion with patchy acute infarcts affecting the left PCA territory of the cerebellum and also the left lateral and central medulla. Subjective: pt alert,  hungry Assessment / Plan / Recommendation CHL IP CLINICAL IMPRESSIONS 03/07/2016 Therapy Diagnosis Mild pharyngeal phase dysphagia Clinical Impression Pt has a mild pharyngeal dysphagia characterized by impaired timing that results in silent penetration before the swallow with thin liquids. Min cues for use of a chin tuck are effective  at increasing airway protection. Suspect that delayed throat clearing and coughing at bedside was due to either increased volume or depth of airway invasion. Recommend regular diet textures and thin liquids but with use of chin tuck.  Impact on safety and function Mild aspiration risk   CHL IP TREATMENT RECOMMENDATION 03/07/2016 Treatment Recommendations Therapy as outlined in treatment plan below   Prognosis 03/07/2016 Prognosis for Safe Diet Advancement Good Barriers to Reach Goals -- Barriers/Prognosis Comment -- CHL IP DIET RECOMMENDATION 03/07/2016 SLP Diet Recommendations Regular solids;Thin liquid Liquid Administration via Cup;Straw Medication Administration Whole meds with puree Compensations Slow rate;Small sips/bites;Chin tuck Postural Changes Seated upright at 90 degrees   CHL IP OTHER RECOMMENDATIONS 03/07/2016 Recommended Consults -- Oral Care Recommendations Oral care BID Other Recommendations --   CHL IP FOLLOW UP RECOMMENDATIONS 03/07/2016 Follow up Recommendations (No Data)   CHL IP FREQUENCY AND DURATION 03/07/2016 Speech Therapy Frequency (ACUTE ONLY) min 2x/week Treatment Duration 2 weeks      CHL IP ORAL PHASE 03/07/2016 Oral Phase WFL Oral - Pudding Teaspoon -- Oral - Pudding Cup -- Oral - Honey Teaspoon -- Oral - Honey Cup -- Oral - Nectar Teaspoon -- Oral - Nectar Cup -- Oral - Nectar Straw -- Oral - Thin Teaspoon -- Oral - Thin Cup -- Oral - Thin Straw -- Oral - Puree -- Oral - Mech Soft -- Oral - Regular -- Oral - Multi-Consistency -- Oral - Pill -- Oral Phase - Comment --  CHL IP PHARYNGEAL PHASE 03/07/2016 Pharyngeal Phase Impaired Pharyngeal- Pudding Teaspoon --  Pharyngeal -- Pharyngeal- Pudding Cup -- Pharyngeal -- Pharyngeal- Honey Teaspoon -- Pharyngeal -- Pharyngeal- Honey Cup -- Pharyngeal -- Pharyngeal- Nectar Teaspoon -- Pharyngeal -- Pharyngeal- Nectar Cup -- Pharyngeal -- Pharyngeal- Nectar Straw -- Pharyngeal -- Pharyngeal- Thin Teaspoon -- Pharyngeal -- Pharyngeal- Thin Cup Delayed swallow initiation-pyriform sinuses;Compensatory strategies attempted (with notebox);Penetration/Aspiration before swallow Pharyngeal Material enters airway, remains ABOVE vocal cords and not ejected out Pharyngeal- Thin Straw Delayed swallow initiation-pyriform sinuses;Compensatory strategies attempted (with notebox) Pharyngeal -- Pharyngeal- Puree WFL Pharyngeal -- Pharyngeal- Mechanical Soft -- Pharyngeal -- Pharyngeal- Regular WFL Pharyngeal -- Pharyngeal- Multi-consistency -- Pharyngeal -- Pharyngeal- Pill -- Pharyngeal -- Pharyngeal Comment --  CHL IP CERVICAL ESOPHAGEAL PHASE 03/07/2016 Cervical Esophageal Phase WFL Pudding Teaspoon -- Pudding Cup -- Honey Teaspoon -- Honey Cup -- Nectar Teaspoon -- Nectar Cup -- Nectar Straw -- Thin Teaspoon -- Thin Cup -- Thin Straw -- Puree -- Mechanical Soft -- Regular -- Multi-consistency -- Pill -- Cervical Esophageal Comment -- No flowsheet data found. Maxcine Ham 03/07/2016, 4:14 PM Maxcine Ham, M.A. CCC-SLP [phone]             Ct Head Code Stroke W/o Cm  Result Date: 03/06/2016 CLINICAL DATA:  Code stroke.  Left-sided weakness and numbness. EXAM: CT HEAD WITHOUT CONTRAST TECHNIQUE: Contiguous axial images were obtained from the base of the skull through the vertex without intravenous contrast. COMPARISON:  None. FINDINGS: Brain: There is patchy infarct in the inferior left cerebellum, favored subacute. Mild patchy low-density in the bilateral cerebral white matter, likely chronic microvascular ischemia in this patient with multiple vascular risk factors. No acute hemorrhage, hydrocephalus, or mass. Vascular: No  hyperdense vessel.  Atherosclerotic calcification. Skull: Hyperostosis.  No acute or aggressive finding. Sinuses/Orbits: Gaze to the left, nonspecific. Other: Critical Value/emergent results were called by telephone at the time of interpretation on 03/06/2016 at 12:45 pm to Dr. Amada Jupiter, who verbally acknowledged these results. ASPECTS [facility] Stroke Program Early CT  Score) - Ganglionic level infarction (caudate, lentiform nuclei, internal capsule, insula, M1-M3 cortex): 7 - Supraganglionic infarction (M4-M6 cortex): 3 Total score (0-10 with 10 being normal): 10 IMPRESSION: 1. Moderate area of inferior left cerebellar infarction, favored subacute. 2. Mild chronic microvascular ischemic change in the cerebral white matter. 3. ASPECTS is 10. Electronically Signed   By: Marnee Spring M.D.   On: 03/06/2016 12:47    Subjective: No complaints. Eager to start therapy  Discharge Exam: Vitals:   03/10/16 0500 03/10/16 0945  BP: (!) 166/74 (!) 158/56  Pulse: 75 72  Resp: 18 18  Temp: 97.8 F (36.6 C) 97.7 F (36.5 C)   Vitals:   03/09/16 2309 03/10/16 0127 03/10/16 0500 03/10/16 0945  BP:  (!) 151/73 (!) 166/74 (!) 158/56  Pulse: 73 79 75 72  Resp: 18 18 18 18   Temp:  97.9 F (36.6 C) 97.8 F (36.6 C) 97.7 F (36.5 C)  TempSrc:  Axillary Oral Oral  SpO2: 97% 99% 99% 99%  Weight:      Height:        General: Pt is alert, awake, not in acute distress Cardiovascular: RRR, S1/S2 +, no rubs, no gallops Respiratory: CTA bilaterally, no wheezing, no rhonchi Abdominal: Soft, NT, ND, bowel sounds + Extremities: no edema, no cyanosis   The results of significant diagnostics from this hospitalization (including imaging, microbiology, ancillary and laboratory) are listed below for reference.     Microbiology: No results found for this or any previous visit (from the past 240 hour(s)).   Labs: BNP (last 3 results) No results for input(s): BNP in the last 8760 hours. Basic Metabolic  Panel:  Recent Labs Lab 03/06/16 1217 03/06/16 1223 03/07/16 0307  NA 140 144 142  K 3.8 3.7 3.9  CL 106 106 107  CO2 24  --  25  GLUCOSE 113* 116* 130*  BUN 21* 22* 16  CREATININE 0.98 1.00 0.84  CALCIUM 9.4  --  9.2   Liver Function Tests:  Recent Labs Lab 03/06/16 1217  AST 14*  ALT 15  ALKPHOS 78  BILITOT 0.3  PROT 7.0  ALBUMIN 3.5   No results for input(s): LIPASE, AMYLASE in the last 168 hours. No results for input(s): AMMONIA in the last 168 hours. CBC:  Recent Labs Lab 03/06/16 1217 03/06/16 1223 03/07/16 0307  WBC 6.6  --  9.3  NEUTROABS 3.8  --   --   HGB 12.9 13.6 12.5  HCT 40.3 40.0 39.5  MCV 94.2  --  93.4  PLT 224  --  218   Cardiac Enzymes: No results for input(s): CKTOTAL, CKMB, CKMBINDEX, TROPONINI in the last 168 hours. BNP: Invalid input(s): POCBNP CBG:  Recent Labs Lab 03/09/16 1638 03/09/16 2109 03/10/16 0629 03/10/16 0859 03/10/16 1148  GLUCAP 139* 110* 77 131* 102*   D-Dimer No results for input(s): DDIMER in the last 72 hours. Hgb A1c No results for input(s): HGBA1C in the last 72 hours. Lipid Profile No results for input(s): CHOL, HDL, LDLCALC, TRIG, CHOLHDL, LDLDIRECT in the last 72 hours. Thyroid function studies No results for input(s): TSH, T4TOTAL, T3FREE, THYROIDAB in the last 72 hours.  Invalid input(s): FREET3 Anemia work up No results for input(s): VITAMINB12, FOLATE, FERRITIN, TIBC, IRON, RETICCTPCT in the last 72 hours. Urinalysis    Component Value Date/Time   COLORURINE YELLOW 03/07/2016 2313   APPEARANCEUR CLOUDY (A) 03/07/2016 2313   LABSPEC 1.015 03/07/2016 2313   PHURINE 7.0 03/07/2016 2313   GLUCOSEU  NEGATIVE 03/07/2016 2313   GLUCOSEU NEGATIVE 04/05/2011 1129   HGBUR SMALL (A) 03/07/2016 2313   BILIRUBINUR NEGATIVE 03/07/2016 2313   KETONESUR NEGATIVE 03/07/2016 2313   PROTEINUR NEGATIVE 03/07/2016 2313   UROBILINOGEN 0.2 04/05/2011 1129   NITRITE POSITIVE (A) 03/07/2016 2313    LEUKOCYTESUR LARGE (A) 03/07/2016 2313   Sepsis Labs Invalid input(s): PROCALCITONIN,  WBC,  LACTICIDVEN Microbiology No results found for this or any previous visit (from the past 240 hour(s)).   SIGNED:   Jerald Kief, MD  [facility] 03/10/2016, 12:37 PM  If 7PM-7AM, please contact night-coverage [email or web address] Password [email or web address]

## 2016-03-10 NOTE — PMR Pre-admission (Signed)
PMR Admission Coordinator Pre-Admission Assessment  Patient: Megan Atkinson is an 66 y.o., female MRN: [id number] DOB: 1950-10-07 Height: 5\' 7"  (170.2 cm) Weight: (!) 161.8 kg (356 lb 11.3 oz)              Insurance Information HMO: X    PPO:      PCP:      IPA:      80/20:      OTHER:  PRIMARY: BCBS Medicare       Policy#: [id number]      Subscriber: Self CM Name: Eugenio Hoes       Phone#: [phone]     Fax#: [phone] Pre-Cert#:                              Employer: Full Time Benefits:  Phone #: [phone]     Name: online  Eff. Date: 02/07/16     Deduct: $0      Out of Pocket Max: $5,500      Life Max: N/A CIR: $300 a day, days 1-6; $0 a day, days 6+      SNF: $0 a day, days 1-20; $167.50 a day, days 21-100 Outpatient: PT/OT/SLP      Co-Pay: $40 visit  Home Health: PT/OT/SLP      Co-Pay: none DME: 80%     Co-Pay: 20%  Providers: in network   Medicaid Application Date:       Case Manager:  Disability Application Date:       Case Worker:   Emergency Conservator, museum/gallery Information    Name Relation Home Work Mobile   March ARB Son [phone]       Current Medical History  Patient Admitting Diagnosis: Left cerebellar infarct  History of Present Illness: Megan Atkinson a 66 y.o.femalewith history of CAD, HTN, morbid obesity who was admitted on 03/06/16 with vertigo and left sided weakness with numbness. CT head reviewed, showing left cerebellar infarct, thought to be subacute. TPA not administered. CTA head/neck no significant stenosis in neck and moderate focal stenosis left [facility]. 2D echo with eF 60-65% with mild to moderately calcified aortic valve and grade I diastolic dysfunction. BLE dopplers negative for DVT. Dr. Roda Shutters felt that stroke due to atherosclerosis and multiple risk factors. He recommended ASA/Plavix for three months followed by plavix alone. MBS done 1/30 revealing mild pharyngeal dysphagia with penetration of thin therefore chin tuck with  thin liquids recommended. Patient with resultant visual deficits with vestibular issues, difficulty with weight shifting, stooped posture and left lateral lean with ambulation. Therapy ongoing and CIR recommended for follow up therapy.   NIH Total: 4    Past Medical History  Past Medical History:  Diagnosis Date  . CAD (coronary artery disease)    small NSTEMI 11/12: LHC 01/06/11:  LAD 90-95%, mD1 90%, pD2 50%,  pOM1 50%, RCA occluded,  EF > 65%;  CABG  12/12: L-LAD  . Diabetes mellitus   . Glucose intolerance (impaired glucose tolerance)    Hemoglobin A1c 6.4 in 12/2010  . HTN (hypertension)    Echo 01/06/11: EF 65%  . Hyperlipidemia   . Obesity     Family History  family history is not on file.  Prior Rehab/Hospitalizations:  Has the patient had major surgery during 100 days prior to admission? No  Current Medications   Current Facility-Administered Medications:  .  acetaminophen (TYLENOL) tablet 650 mg, 650 mg, Oral, Q4H PRN **OR** acetaminophen (TYLENOL) solution  650 mg, 650 mg, Per Tube, Q4H PRN **OR** acetaminophen (TYLENOL) suppository 650 mg, 650 mg, Rectal, Q4H PRN, Leatha Gilding, MD .  aspirin EC tablet 325 mg, 325 mg, Oral, Daily, Marvel Plan, MD, 325 mg at 03/10/16 0901 .  atorvastatin (LIPITOR) tablet 80 mg, 80 mg, Oral, q1800, Marvel Plan, MD, 80 mg at 03/09/16 1646 .  clopidogrel (PLAVIX) tablet 75 mg, 75 mg, Oral, Daily, Marvel Plan, MD, 75 mg at 03/10/16 0901 .  enoxaparin (LOVENOX) injection 40 mg, 40 mg, Subcutaneous, Q24H, Leatha Gilding, MD, 40 mg at 03/09/16 2045 .  insulin aspart (novoLOG) injection 0-9 Units, 0-9 Units, Subcutaneous, TID WC, Costin Otelia Sergeant, MD, 1 Units at 03/09/16 1657 .  ondansetron (ZOFRAN) injection 4 mg, 4 mg, Intravenous, Q8H PRN, Leatha Gilding, MD, 4 mg at 03/06/16 2259  Patients Current Diet: Diet Carb Modified Fluid consistency: Thin; Room service appropriate? Yes  Precautions / Restrictions Precautions Precautions:  Fall Precaution Comments: L heavy pusher, L weakness Restrictions Weight Bearing Restrictions: No   Has the patient had 2 or more falls or a fall with injury in the past year?No  Prior Activity Level Community (5-7x/wk): Prior to admission patient was fully independent.  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    Home Assistive Devices / Equipment Home Assistive Devices/Equipment: None Home Equipment: Walker - 2 wheels  Prior Device Use: Indicate devices/aids used by the patient prior to current illness, exacerbation or injury? None of the above  Prior Functional Level Prior Function Level of Independence: Independent Comments: Has walker but does not use this. 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  Self Care: Did the patient need help bathing, dressing, using the toilet or eating? Independent  Indoor Mobility: Did the patient need assistance with walking from room to room (with or without device)? Independent  Stairs: Did the patient need assistance with internal or external stairs (with or without device)? Independent  Functional Cognition: Did the patient need help planning regular tasks such as shopping or remembering to take medications? Independent  Current Functional Level Cognition  Arousal/Alertness: Awake/alert Overall Cognitive Status: Within Functional Limits for tasks assessed Orientation Level: Oriented X4 General Comments: Pt able to engage and follow commands with various tasks performed.  Memory: Appears intact Awareness: Appears intact Problem Solving: Appears intact Safety/Judgment: Appears intact    Extremity Assessment (includes Sensation/Coordination)  Upper Extremity Assessment: Defer to OT evaluation LUE Deficits / Details: Grossly weak as compared to R UE. AROM WFL. Noted decreased proprioception during movements. LUE Sensation: decreased proprioception LUE  Coordination: decreased gross motor  Lower Extremity Assessment: LLE deficits/detail LLE Deficits / Details: weakness, unable to move off bed independently; limited supporting in stance    ADLs  Overall ADL's : Needs assistance/impaired Grooming: Sitting, Minimal assistance Upper Body Bathing: Moderate assistance, Sitting Upper Body Bathing Details (indicate cue type and reason): Up to mod assist to maintain balance in sitting. Lower Body Bathing: Maximal assistance, Sitting/lateral leans Upper Body Dressing : Moderate assistance, Sitting Lower Body Dressing: Maximal assistance, Sitting/lateral leans Toilet Transfer: +2 for physical assistance, Maximal assistance, BSC Toilet Transfer Details (indicate cue type and reason): Simulated using Stedy. Attempted sit<>stand with max assist +2 with RW and unable to complete Toileting- Clothing Manipulation and Hygiene: Total assistance, Sit to/from stand General ADL Comments: Pt able to complete simulated toilet transfer wtih Stedy and max assist of 2. Educated pt and son on ADL  strategies during recovery.    Mobility  Overal bed mobility: Needs Assistance Bed Mobility: Supine to Sit Supine to sit: Min assist General bed mobility comments: pt in chair upon arrival    Transfers  Overall transfer level: Needs assistance Equipment used: Rolling walker (2 wheeled) Transfer via Lift Equipment: Stedy Transfers: Sit to/from Stand Sit to Stand: +2 physical assistance, Mod assist General transfer comment: Pt required cuing for proper hand placement and mod A +2 for boost into standing from chair x2.    Ambulation / Gait / Stairs / Wheelchair Mobility  Ambulation/Gait Ambulation/Gait assistance: Mod assist, +2 physical assistance (3rd person for w/c follow) Ambulation Distance (Feet): 8 Feet Assistive device: Rolling walker (2 wheeled) Gait Pattern/deviations: Trunk flexed, Decreased weight shift to right, Decreased step length - right, Decreased  step length - left General Gait Details: Pt required two trials due to first attempt being limited by fear and pt requesting to sit down. Second attempt, pt was able to walk about 10 feet with mod A +3 for w/c follow and patient/ staff safety. Pt unable to maintain upright posture without cuing and assistance.; mod A +2 to assist compenstation of left lateral lean.  Gait velocity: slow, fearful    Posture / Balance Dynamic Sitting Balance Sitting balance - Comments: able to sit EOB with no UE support Balance Overall balance assessment: Needs assistance Sitting-balance support: Bilateral upper extremity supported, Feet supported Sitting balance-Leahy Scale: Fair Sitting balance - Comments: able to sit EOB with no UE support Standing balance support: Bilateral upper extremity supported, During functional activity Standing balance-Leahy Scale: Poor Standing balance comment: mod A of 2 in standing with walker, heavy lean to L    Special needs/care consideration BiPAP/CPAP: Yes, CPAP CPM: No Continuous Drip IV: No Dialysis: No         Life Vest: No Oxygen: No Special Bed: No Trach Size: No Wound Vac (area): No       Skin: WDL, dry                               Bowel mgmt: 03/06/16 Bladder mgmt: Patient reports leakage if bladder is very full Diabetic mgmt: HgbA1c 6.3      Previous Home Environment Living Arrangements: Alone  Lives With: Alone Available Help at Discharge: Family, Available 24 hours/day, Available PRN/intermittently, Friend(s) Type of Home: House Home Layout: Two level, 1/2 bath on main level (plans to stay on main level) Home Access: Stairs to enter Entergy CorporationEntrance Stairs-Number of Steps: 6 (but reports level entry at front Bathroom Shower/Tub: Engineer, manufacturing systemsTub/shower unit Bathroom Toilet: Standard Home Care Services: No  Discharge Living Setting Plans for Discharge Living Setting: Patient's home, Other (Comment) (lived alone PTA; spouse plans to assist 24/7 upon dischage ) Type of  Home at Discharge: House Discharge Home Layout: Two level, Able to live on main level with bedroom/bathroom (PTA bed and bath were upstairs, but can stay on main floor ) Alternate Level Stairs-Rails: Right Alternate Level Stairs-Number of Steps: 8 Discharge Home Access: Level entry Discharge Bathroom Shower/Tub: Other (comment) (sink only on main level, walking shower or tub upstairs ) Discharge Bathroom Toilet: Standard Discharge Bathroom Accessibility: Yes How Accessible: Accessible via walker Does the patient have any problems obtaining your medications?: No  Social/Family/Support Systems Patient Roles: Parent, Spouse Contact Information: Spouse: Fraser DinReginald Curl [phone] Anticipated Caregiver: Spoke with spouse who agreed  Anticipated Caregiver's Contact Information: see above Ability/Limitations of Caregiver: none per  his report Caregiver Availability: 24/7 Discharge Plan Discussed with Primary Caregiver: Yes Is Caregiver In Agreement with Plan?: Yes Does Caregiver/Family have Issues with Lodging/Transportation while Pt is in Rehab?: No  Goals/Additional Needs Patient/Family Goal for Rehab: PT Supervision-Min; OT Min; SLP Mod I  Expected length of stay: 13-17 days  Cultural Considerations: None Dietary Needs: Carb Mod  Equipment Needs: TBD Special Service Needs: None Additional Information: Patient also has 2 sons who [place]  Pt/Family Agrees to Admission and willing to participate: Yes Program Orientation Provided & Reviewed with Pt/Caregiver Including Roles  & Responsibilities: Yes Additional Information Needs: None Information Needs to be Provided By: N/A  Decrease burden of Care through IP rehab admission: No  Possible need for SNF placement upon discharge: No  Patient Condition: This patient's medical and functional status has changed since the consult dated 03/08/16 in which the Rehabilitation Physician determined and documented that the  patient was potentially appropriate for intensive rehabilitative care in an inpatient rehabilitation facility. Issues have been addressed and update has been discussed with Dr. Riley Kill and patient now appropriate for inpatient rehabilitation. Will admit to inpatient rehab today.   Preadmission Screen Completed By:  [name], 03/10/2016 10:17 AM ______________________________________________________________________   Discussed status with Dr. Riley Kill on 03/10/16 at 1015 and received telephone approval for admission today.  Admission Coordinator:  [name], time 1015/Date 03/10/16

## 2016-03-11 ENCOUNTER — Inpatient Hospital Stay (HOSPITAL_COMMUNITY): Payer: Medicare Other | Admitting: Speech Pathology

## 2016-03-11 ENCOUNTER — Inpatient Hospital Stay (HOSPITAL_COMMUNITY): Payer: Medicare Other | Admitting: Physical Therapy

## 2016-03-11 ENCOUNTER — Inpatient Hospital Stay (HOSPITAL_COMMUNITY): Payer: Medicare Other | Admitting: Occupational Therapy

## 2016-03-11 DIAGNOSIS — I69393 Ataxia following cerebral infarction: Secondary | ICD-10-CM

## 2016-03-11 DIAGNOSIS — I639 Cerebral infarction, unspecified: Secondary | ICD-10-CM

## 2016-03-11 LAB — COMPREHENSIVE METABOLIC PANEL
ALBUMIN: 3.2 g/dL — AB (ref 3.5–5.0)
ALT: 29 U/L (ref 14–54)
AST: 22 U/L (ref 15–41)
Alkaline Phosphatase: 87 U/L (ref 38–126)
Anion gap: 10 (ref 5–15)
BILIRUBIN TOTAL: 0.5 mg/dL (ref 0.3–1.2)
BUN: 19 mg/dL (ref 6–20)
CHLORIDE: 107 mmol/L (ref 101–111)
CO2: 22 mmol/L (ref 22–32)
Calcium: 9.2 mg/dL (ref 8.9–10.3)
Creatinine, Ser: 0.95 mg/dL (ref 0.44–1.00)
GFR calc Af Amer: >60 mL/min (ref >60)
GFR calc non Af Amer: >60 mL/min (ref >60)
GLUCOSE: 120 mg/dL — AB (ref 65–99)
POTASSIUM: 4.6 mmol/L (ref 3.5–5.1)
Sodium: 139 mmol/L (ref 135–145)
TOTAL PROTEIN: 6.5 g/dL (ref 6.5–8.1)

## 2016-03-11 LAB — CBC WITH DIFFERENTIAL/PLATELET
BASOS ABS: 0 10*3/uL (ref 0.0–0.1)
BASOS PCT: 1 %
Eosinophils Absolute: 0.4 10*3/uL (ref 0.0–0.7)
Eosinophils Relative: 5 %
HEMATOCRIT: 42 % (ref 36.0–46.0)
HEMOGLOBIN: 13 g/dL (ref 12.0–15.0)
Lymphocytes Relative: 25 %
Lymphs Abs: 2 10*3/uL (ref 0.7–4.0)
MCH: 29.4 pg (ref 26.0–34.0)
MCHC: 31 g/dL (ref 30.0–36.0)
MCV: 95 fL (ref 78.0–100.0)
MONO ABS: 0.5 10*3/uL (ref 0.1–1.0)
Monocytes Relative: 6 %
NEUTROS ABS: 5.2 10*3/uL (ref 1.7–7.7)
Neutrophils Relative %: 63 %
Platelets: 204 10*3/uL (ref 150–400)
RBC: 4.42 MIL/uL (ref 3.87–5.11)
RDW: 14.3 % (ref 11.5–15.5)
WBC: 8.1 10*3/uL (ref 4.0–10.5)

## 2016-03-11 NOTE — Evaluation (Addendum)
Speech Language Pathology Assessment and Plan  Patient Details  Name: Megan Atkinson MRN: [id number] Date of Birth: 1950/12/05  SLP Diagnosis:  (none)  Rehab Potential: Excellent ELOS:  defer to OT/PT   Today's Date: 03/11/2016 SLP Individual Time: 0800-0900 SLP Individual Time Calculation (min): 60 min   Problem List:  Patient Active Problem List   Diagnosis Date Noted  . Cerebellar stroke, acute ([place]) 03/10/2016  . Super obese ([place])   . Prediabetes   . Ataxia, post-stroke   . Acute cystitis without hematuria   . CVA (cerebral vascular accident) ([place]) 03/06/2016  . Morbid obesity ([place]) 02/29/2016  . OSA (obstructive sleep apnea) 11/24/2014  . Dyspnea 10/02/2014  . Anemia 04/05/2011  . Urinary tract infection, site not specified 04/05/2011  . Diabetes mellitus ([place]) 02/22/2011  . Hyperlipidemia 02/22/2011  . CAD (coronary artery disease) 01/07/2011  . Hypertension 01/07/2011   Past Medical History:  Past Medical History:  Diagnosis Date  . CAD (coronary artery disease)    small NSTEMI 11/12: LHC 01/06/11:  LAD 90-95%, mD1 90%, pD2 50%,  pOM1 50%, RCA occluded,  EF > 65%;  CABG  12/12: L-LAD  . Diabetes mellitus   . Glucose intolerance (impaired glucose tolerance)    Hemoglobin A1c 6.4 in 12/2010  . HTN (hypertension)    Echo 01/06/11: EF 65%  . Hyperlipidemia   . Obesity    Past Surgical History:  Past Surgical History:  Procedure Laterality Date  . ABDOMINAL HYSTERECTOMY    . APPENDECTOMY    . CORONARY ARTERY BYPASS GRAFT  01/12/2011   Procedure: CORONARY ARTERY BYPASS GRAFTING (CABG);  Surgeon: Gaye Pollack, MD;  Location: [facility];  Service: Open Heart Surgery;  Laterality: N/A;  coronary artery bypass graft times one using left internal mammary artery . Attempted endoscopic saphenous vein harvest  . LEFT HEART CATHETERIZATION WITH CORONARY ANGIOGRAM N/A 01/06/2011   Procedure: LEFT HEART CATHETERIZATION WITH CORONARY ANGIOGRAM;  Surgeon: Minus Breeding, MD;   Location: [facility] CATH LAB;  Service: Cardiovascular;  Laterality: N/A;    Assessment / Plan / Recommendation Clinical Impression Megan Atkinson a 66 y.o.femalewith history of CAD, HTN, morbid obesity who was admitted on 03/06/16 with vertigo and left sided weakness with numbness. CT head reviewed, showing left cerebellar infarct,thought to be subacute. TPA not administered. CTA head/neck no significant stenosis in neck and moderate focal stenosis left VA. 2D echo with eF 60-65% with mild to moderately calcified aortic valve and grade I diastolic dysfunction. BLE dopplers negative for DVT. Dr. Erlinda Hong felt that stroke due to atherosclerosis and multiple risk factors. He recommended ASA/Plavix for three months followed by plavix alone. MBS done 1/30 revealing mild pharyngeal dysphagia with penetration of thin therefore chin tuck with thin liquids recommended. Swallow re-eval done today showing improvement and no need for chin tuck. Patient with resultant visual deficits with vestibular issues, difficulty with weight shifting, stooped posture and left lateral lean with ambulation. Therapy ongoing and CIR recommended for follow up therapy.   Patient admitted to IP Rehab 03/10/16. Bedside swallow and further cognitive linguistic evaluations completed.  Patient was observed to consume regular textures and thin liquids with consecutive sips for completion of the 3oz chug challenge with effective oral clearance and no immediate or delayed s/s of aspiration.  Patient Mod I for recall and math calculations.  Patient with improvements in naming fluency, convergent and divergent tasks.  Speech, language, and cognition intact.  Patient in agreement that she is at baseline and no further skilled  SLP services are warranted at this.  SLP will sign off.    Skilled Therapeutic Interventions          Cognitive-linguistic and bedside swallow evaluations completed with results and recommendations reviewed with patient.     SLP  Assessment  Patient will need skilled [facility] during CIR admission    Recommendations  Oral Care Recommendations: Oral care BID Patient destination: Home Follow up Recommendations: None Equipment Recommended: None recommended by SLP                     Pain Pain Assessment Pain Assessment: No/denies pain  Prior Functioning Cognitive/Linguistic Baseline: Within functional limits Type of Home: House  Lives With: Alone Available Help at Discharge: Family;Available 24 hours/day;Friend(s) Education: B.S., Nursing Vocation: Full time employment  Function:  Eating Eating   Modified Consistency Diet: No Eating Assist Level: Set up assist for   Eating Set Up Assist For: Opening containers       Cognition Comprehension Comprehension assist level: Follows complex conversation/direction with no assist  Expression   Expression assist level: Expresses complex ideas: With no assist  Social Interaction Social Interaction assist level: Interacts appropriately with others - No medications needed.  Problem Solving Problem solving assist level: Solves complex problems: With extra time  Memory Memory assist level: Assistive device: No helper    Recommendations for other services: None   Discharge Criteria: Patient will be discharged from SLP if patient refuses treatment 3 consecutive times without medical reason, if treatment goals not met, if there is a change in medical status, if patient makes no progress towards goals or if patient is discharged from hospital.  The above assessment, treatment plan, treatment alternatives and goals were discussed and mutually agreed upon: by patient  Gunnar Fusi, M.A., CCC-SLP [place] 03/11/2016, 8:54 AM

## 2016-03-11 NOTE — Evaluation (Signed)
Occupational Therapy Assessment and Plan  Patient Details  Name: Lashawndra Lampkins MRN: [id number] Date of Birth: 12-22-1950  OT Diagnosis: abnormal posture, hemiplegia affecting non-dominant side and muscle weakness (generalized) Rehab Potential: Rehab Potential (ACUTE ONLY): Excellent ELOS: 16-18 days    Today's Date: 03/11/2016 OT Individual Time: 2297-9892 OT Individual Time Calculation (min): 77 min    Problem List:  Patient Active Problem List   Diagnosis Date Noted  . Cerebellar stroke, acute ([place]) 03/10/2016  . Super obese ([place])   . Prediabetes   . Ataxia, post-stroke   . Acute cystitis without hematuria   . CVA (cerebral vascular accident) ([place]) 03/06/2016  . Morbid obesity ([place]) 02/29/2016  . OSA (obstructive sleep apnea) 11/24/2014  . Dyspnea 10/02/2014  . Anemia 04/05/2011  . Urinary tract infection, site not specified 04/05/2011  . Diabetes mellitus ([place]) 02/22/2011  . Hyperlipidemia 02/22/2011  . CAD (coronary artery disease) 01/07/2011  . Hypertension 01/07/2011    Past Medical History:  Past Medical History:  Diagnosis Date  . CAD (coronary artery disease)    small NSTEMI 11/12: LHC 01/06/11:  LAD 90-95%, mD1 90%, pD2 50%,  pOM1 50%, RCA occluded,  EF > 65%;  CABG  12/12: L-LAD  . Diabetes mellitus   . Glucose intolerance (impaired glucose tolerance)    Hemoglobin A1c 6.4 in 12/2010  . HTN (hypertension)    Echo 01/06/11: EF 65%  . Hyperlipidemia   . Obesity    Past Surgical History:  Past Surgical History:  Procedure Laterality Date  . ABDOMINAL HYSTERECTOMY    . APPENDECTOMY    . CORONARY ARTERY BYPASS GRAFT  01/12/2011   Procedure: CORONARY ARTERY BYPASS GRAFTING (CABG);  Surgeon: Gaye Pollack, MD;  Location: [place];  Service: Open Heart Surgery;  Laterality: N/A;  coronary artery bypass graft times one using left internal mammary artery . Attempted endoscopic saphenous vein harvest  . LEFT HEART CATHETERIZATION WITH CORONARY ANGIOGRAM N/A  01/06/2011   Procedure: LEFT HEART CATHETERIZATION WITH CORONARY ANGIOGRAM;  Surgeon: Minus Breeding, MD;  Location: [facility] CATH LAB;  Service: Cardiovascular;  Laterality: N/A;    Assessment & Plan Clinical Impression: Lajuana Patchell a 66 y.o.femalewith history of CAD, HTN, morbid obesity who was admitted on 03/06/16 with vertigo and left sided weakness with numbness. CT head reviewed, showing left cerebellar infarct,thought to be subacute. TPA not administered. CTA head/neck no significant stenosis in neck and moderate focal stenosis left [facility]. 2D echo with eF 60-65% with mild to moderately calcified aortic valve and grade I diastolic dysfunction. BLE dopplers negative for DVT. Dr. Erlinda Hong felt that stroke due to atherosclerosis and multiple risk factors. He recommended ASA/Plavix for three months followed by plavix alone. MBS done 1/30 revealing mild pharyngeal dysphagia with penetration of thin therefore chin tuck with thin liquids recommended. Swallow re-eval done today showing improvement and no need for chin tuck. Patient with resultant visual deficits with vestibular issues, difficulty with weight shifting, stooped posture and left lateral lean with ambulation. Therapy ongoing and CIR recommended for follow up therapy. Patient transferred to CIR on 03/10/2016 .   Patient currently requires total with basic self-care skills secondary to muscle weakness, decreased cardiorespiratoy endurance, unbalanced muscle activation and decreased coordination and decreased postural control and hemiplegia.  Prior to hospitalization, patient could complete BADLs with independent .  Patient will benefit from skilled intervention to increase independence with basic self-care skills prior to discharge home with care partner.  Anticipate patient will require 24 hour supervision and follow up  home health and follow up outpatient.  OT - End of Session Endurance Deficit: Yes Endurance Deficit Description:  (global  weakness) OT Assessment Rehab Potential (ACUTE ONLY): Excellent Barriers to Discharge: Decreased caregiver support OT Patient demonstrates impairments in the following area(s): Safety;Motor;Balance OT Basic ADL's Functional Problem(s): Grooming;Bathing;Dressing;Toileting OT Advanced ADL's Functional Problem(s): Simple Meal Preparation;Laundry OT Transfers Functional Problem(s): Toilet;Tub/Shower OT Additional Impairment(s): Fuctional Use of Upper Extremity OT Plan OT Intensity: Minimum of 1-2 x/day, 45 to 90 minutes OT Frequency: 5 out of 7 days OT Duration/Estimated Length of Stay: 16-18 days OT Treatment/Interventions: Balance/vestibular training;Discharge planning;Self Care/advanced ADL retraining;Therapeutic Activities;UE/LE Coordination activities;Patient/family education;Therapeutic Exercise;Functional mobility training;DME/adaptive equipment instruction;Neuromuscular re-education;Psychosocial support;UE/LE Strength taining/ROM OT Self Feeding Anticipated Outcome(s): N/A OT Basic Self-Care Anticipated Outcome(s): Supervision-Mod I  OT Toileting Anticipated Outcome(s): Supervision  OT Bathroom Transfers Anticipated Outcome(s): Supervision  OT Recommendation Recommendations for Other Services: Vestibular eval Patient destination: Home Follow Up Recommendations: 24 hour supervision/assistance Equipment Recommended: To be determined   Skilled Therapeutic Intervention Skilled OT session completed with focus on initial evaluation, education on OT role/POC, and establishment of patient-centered goals. Pt was lying in bed at time of arrival, agreeable to tx. Supine<sit with elevated HOB completed with close supervision. Bathing/dressing completed at EOB with overall Mod-Max A with instruction on hemi techniques. Cues for left pushing during dynamic tasks. Pt able to use L UE at diminished level during ADLs with min vcs. Sit<stand portions during ADLs completed in [place] with Mod A, mild left  pushing. Simulated toilet transfer completed with Stedy, and pt reported wanting to try to void but was unsuccessful. Grooming/oral care completed in [place] at sink. Afterwards pt was transferred to recliner and bilateral LEs elevated due to foot edema. She was provided with yellow theraputty with instruction on exercise techniques. While she completed exercises, discussed d/c, ELOS, and possible DME needs. Pt was then left with all needs within reach at time of departure.   OT Evaluation Precautions/Restrictions  Precautions Precautions: Fall Precaution Comments:  (Left hemiparesis, vertigo ) Restrictions Weight Bearing Restrictions: No General Chart Reviewed: Yes Family/Caregiver Present: No Vital Signs  Pain   Home Living/Prior Functioning Home Living Available Help at Discharge: Family, Available 24 hours/day, Friend(s) Type of Home: House Home Access: Level entry Entrance Stairs-Rails: Right Home Layout: 1/2 bath on main level, Multi-level Alternate Level Stairs-Number of Steps: 3 + 8  Alternate Level Stairs-Rails: Right Bathroom Shower/Tub: Government social research officer Accessibility: Yes  Lives With: Spouse IADL History Homemaking Responsibilities: Yes Meal Prep Responsibility: Primary Laundry Responsibility: Primary Cleaning Responsibility: Primary Shopping Responsibility: Primary Mode of Transportation: Car Education: B.S., Nursing Occupation: Full time employment 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 take the stairs to geta  shower  at home ADL ADL ADL Comments: Please see functional navigator for ADL status Vision/Perception  Vision- History Baseline Vision/History: No visual deficits Patient Visual Report:  (wears contacts) Vision- Assessment Vision Assessment?: No apparent visual deficits Praxis Praxis-Other Comments: during BADL completion   Cognition Overall Cognitive Status: Within Functional Limits for tasks assessed Arousal/Alertness: Awake/alert Orientation Level: Person;Place;Situation Person: Oriented Place: Oriented Situation: Oriented Year: 2018 Month: February Day of Week: Correct Memory: Appears  intact Immediate Memory Recall: Sock;Blue;Bed Memory Recall: Sock;Blue;Bed Attention: Sustained Sustained Attention: Appears intact Awareness: Appears intact Problem Solving: Appears intact Safety/Judgment: Appears intact Sensation Sensation Light Touch: Appears Intact Stereognosis: Not tested Hot/Cold: Appears Intact Proprioception: Impaired Detail Proprioception Impaired Details: Impaired LUE Coordination Gross Motor Movements are Fluid and Coordinated: No Fine Motor Movements are Fluid and Coordinated: No Coordination and Movement Description: L UE coordination deficits Finger Nose Finger Test: Mild dysmetria Motor  Motor Motor: Hemiplegia;Abnormal postural alignment and control Motor - Skilled Clinical Observations: left push while sitting in [place] for functional transfers/self care tasks  Mobility  Transfers Sit to Stand: 3: Mod assist  Trunk/Postural Assessment  Cervical Assessment Cervical Assessment: Exceptions to [facility] (forward fleed) Thoracic Assessment Thoracic Assessment: Exceptions to [facility] (kyphosis) Lumbar Assessment Lumbar Assessment: Exceptions to [facility] (posterior pelvic tilt) Postural Control Postural Control: Deficits on evaluation (left pushing tendencies at EOB)  Balance Balance Balance Assessed: Yes Dynamic Sitting Balance Dynamic Sitting - Level of Assistance:  4: Min assist Extremity/Trunk Assessment RUE Assessment RUE Assessment: Within Functional Limits LUE Assessment LUE Assessment: Within Functional Limits   See Function Navigator for Current Functional Status.   Refer to Care Plan for Long Term Goals  Recommendations for other services: Therapeutic Recreation  Pet therapy and Kitchen group   Discharge Criteria: Patient will be discharged from OT if patient refuses treatment 3 consecutive times without medical reason, if treatment goals not met, if there is a change in medical status, if patient makes no progress towards goals or if patient is discharged from hospital.  The above assessment, treatment plan, treatment alternatives and goals were discussed and mutually agreed upon: by patient  Skeet Simmer 03/11/2016, 8:13 PM

## 2016-03-11 NOTE — Progress Notes (Signed)
03/11/16 1514 nursing RN went to check patient noticed that patient was shivering. RN assessed patient no other complaints. Per patient they checked her CBG before therapy and WNL; vital signs WNL. Per patient maybe she is just tired from therapy. This is patient's first day of therapy. RN advised patient to rest. Continued to monitor.

## 2016-03-11 NOTE — Evaluation (Signed)
Physical Therapy Assessment and Plan  Patient Details  Name: Megan Atkinson MRN: [id number] Date of Birth: [birth year]  PT Diagnosis: Abnormal posture, Abnormality of gait, Ataxia, Coordination disorder, Difficulty walking, Edema, Impaired sensation and Muscle weakness Rehab Potential: Good ELOS: 16-18 days   Today's Date: 03/11/2016 PT Individual Time: 1100-1200 PT Individual Time Calculation (min): 60 min    Problem List:  Patient Active Problem List   Diagnosis Date Noted  . Cerebellar stroke, acute ([place]) 03/10/2016  . Super obese ([place])   . Prediabetes   . Ataxia, post-stroke   . Acute cystitis without hematuria   . CVA (cerebral vascular accident) ([place]) 03/06/2016  . Morbid obesity ([place]) 02/29/2016  . OSA (obstructive sleep apnea) 11/24/2014  . Dyspnea 10/02/2014  . Anemia 04/05/2011  . Urinary tract infection, site not specified 04/05/2011  . Diabetes mellitus ([place]) 02/22/2011  . Hyperlipidemia 02/22/2011  . CAD (coronary artery disease) 01/07/2011  . Hypertension 01/07/2011    Past Medical History:  Past Medical History:  Diagnosis Date  . CAD (coronary artery disease)    small NSTEMI 11/12: LHC 01/06/11:  LAD 90-95%, mD1 90%, pD2 50%,  pOM1 50%, RCA occluded,  EF > 65%;  CABG  12/12: L-LAD  . Diabetes mellitus   . Glucose intolerance (impaired glucose tolerance)    Hemoglobin A1c 6.4 in 12/2010  . HTN (hypertension)    Echo 01/06/11: EF 65%  . Hyperlipidemia   . Obesity    Past Surgical History:  Past Surgical History:  Procedure Laterality Date  . ABDOMINAL HYSTERECTOMY    . APPENDECTOMY    . CORONARY ARTERY BYPASS GRAFT  01/12/2011   Procedure: CORONARY ARTERY BYPASS GRAFTING (CABG);  Surgeon: Gaye Pollack, MD;  Location: [place];  Service: Open Heart Surgery;  Laterality: N/A;  coronary artery bypass graft times one using left internal mammary artery . Attempted endoscopic saphenous vein harvest  . LEFT HEART CATHETERIZATION WITH CORONARY ANGIOGRAM N/A  01/06/2011   Procedure: LEFT HEART CATHETERIZATION WITH CORONARY ANGIOGRAM;  Surgeon: Minus Breeding, MD;  Location: [facility] CATH LAB;  Service: Cardiovascular;  Laterality: N/A;    Assessment & Plan Clinical Impression:  Megan Atkinson a 66 y.o.femalewith history of CAD, HTN, morbid obesity who was admitted on 03/06/16 with vertigo and left sided weakness with numbness. CT head reviewed, showing left cerebellar infarct,thought to be subacute. TPA not administered. CTA head/neck no significant stenosis in neck and moderate focal stenosis left [facility]. 2D echo with eF 60-65% with mild to moderately calcified aortic valve and grade I diastolic dysfunction. BLE dopplers negative for DVT. Dr. Erlinda Hong felt that stroke due to atherosclerosis and multiple risk factors. He recommended ASA/Plavix for three months followed by plavix alone. MBS done 1/30 revealing mild pharyngeal dysphagia with penetration of thin therefore chin tuck with thin liquids recommended. Swallow re-eval done today showing improvement and no need for chin tuck. Patient with resultant visual deficits with vestibular issues, difficulty with weight shifting, stooped posture and left lateral lean with ambulation. Therapy ongoing and CIR recommended for follow up therapy. Patient transferred to CIR on 03/10/2016 .   Patient currently requires max with mobility secondary to muscle weakness, decreased cardiorespiratoy endurance, impaired timing and sequencing, ataxia and decreased coordination and decreased sitting balance, decreased standing balance, decreased postural control and decreased balance strategies.  Prior to hospitalization, patient was independent  with mobility and lived with Alone in a House (3 levels) home.  Home access is  Level entry (0).  Patient will benefit from  skilled PT intervention to maximize safe functional mobility, minimize fall risk and decrease caregiver burden for planned discharge home with 24 hour supervision.   Anticipate patient will benefit from follow up [place] at discharge.  PT - End of Session Activity Tolerance: Decreased this session;Tolerates < 10 min activity, no significant change in vital signs Endurance Deficit: Yes Endurance Deficit Description: deconditioned;  PT Assessment Rehab Potential (ACUTE/IP ONLY): Good PT Patient demonstrates impairments in the following area(s): Balance;Behavior;Edema;Endurance;Motor;Safety;Sensory PT Transfers Functional Problem(s): Bed Mobility;Bed to Chair;Car;Furniture PT Locomotion Functional Problem(s): Ambulation;Wheelchair Mobility;Stairs PT Plan PT Intensity: Minimum of 1-2 x/day ,45 to 90 minutes PT Frequency: 5 out of 7 days PT Duration Estimated Length of Stay: 16-18 days PT Treatment/Interventions: Ambulation/gait training;Balance/vestibular training;Community reintegration;Discharge planning;DME/adaptive equipment instruction;Functional mobility training;Neuromuscular re-education;Disease management/prevention;Patient/family education;Psychosocial support;Stair training;Therapeutic Exercise;Therapeutic Activities;UE/LE Strength taining/ROM;UE/LE Coordination activities;Wheelchair propulsion/positioning PT Transfers Anticipated Outcome(s): [employer]  PT Locomotion Anticipated Outcome(s): Supervision with household PT Recommendation Recommendations for Other Services: Therapeutic Recreation consult;Neuropsych consult Therapeutic Recreation Interventions: Clinical cytogeneticist;Outing/community reintergration Follow Up Recommendations: Home health PT;24 hour supervision/assistance Patient destination: Home Equipment Recommended: To be determined  Skilled Therapeutic Intervention  Pt was in recliner prior to evaluation. Pt is mod A with transfers with Rw and stairs with BUE assist. Verbal cues for sequencing, handplacement, and timing required. Pt is min A with stedy transfers so stedy was recommended for nursing. Mod A x 2 with WC follow  for ambulation with Rw. Increased anxiety and fear of falling noted with stairs and walking.  Pt fatigued and was unable to perform car transfer or stand pivot transfer back in room. Charlaine Dalton was use to return to recliner. Pt was left with all needs within reach.  PT Evaluation Precautions/Restrictions Precautions Precautions: Fall Precaution Comments: L sided weakness Restrictions Weight Bearing Restrictions: No General Chart Reviewed: Yes Family/Caregiver Present: No Vital SignsTherapy Vitals Pulse Rate: 83 BP: (!) 159/75 Patient Position (if appropriate): Sitting Oxygen Therapy SpO2: 100 % Pain Pain Assessment Pain Assessment: No/denies pain Home Living/Prior Functioning Home Living Available Help at Discharge: Family;Available 24 hours/day;Friend(s) (friend who is a Marine scientist and 2 adult sons) Type of Home: House (3 levels) Home Access: Level entry (0) Entrance Stairs-Rails: Right Home Layout: 1/2 bath on main level;Multi-level Alternate Level Stairs-Number of Steps: 3 + 8  Alternate Level Stairs-Rails: Right Bathroom Toilet: Standard Bathroom Accessibility: Yes  Lives With: Alone Prior Function Level of Independence: Independent with basic ADLs  Able to Take Stairs?: Yes Driving: Yes Vocation: Full time employment Vocation Requirements: Therapist, sports for [employer] Leisure: Hobbies-yes (Comment) (Music; dancing; water/swimming) Comments: be able to drive and walk around. take the stairs to geta  shower at home  Cognition Overall Cognitive Status: Within Functional Limits for tasks assessed Arousal/Alertness: Awake/alert Orientation Level: Oriented X4 Memory: Appears intact (with use of compensatory strategeis ) Awareness: Appears intact Problem Solving: Appears intact Safety/Judgment: Appears intact Sensation Sensation Light Touch: Appears Intact Coordination Gross Motor Movements are Fluid and Coordinated: No Fine Motor Movements are Fluid and Coordinated: No Motor  Motor Motor:  Ataxia;Abnormal postural alignment and control  Mobility Transfers Transfers: Yes Sit to Stand: 3: Mod assist Sit to Stand Details: Verbal cues for sequencing;Verbal cues for technique;Verbal cues for safe use of DME/AE;Manual facilitation for weight bearing;Verbal cues for precautions/safety Stand to Sit: 3: Mod assist Stand to Sit Details (indicate cue type and reason): Manual facilitation for weight bearing;Verbal cues for safe use of DME/AE;Verbal cues for gait pattern;Manual facilitation for weight shifting;Verbal cues for technique;Verbal cues for sequencing;Verbal cues  for precautions/safety Transfer via Lift Equipment: Medical sales representative Ambulation: Yes Ambulation/Gait Assistance: 1: +2 Total assist Ambulation Distance (Feet): 12 Feet Assistive device: Rolling walker Ambulation/Gait Assistance Details: Manual facilitation for weight bearing;Manual facilitation for placement;Verbal cues for gait pattern;Verbal cues for safe use of DME/AE;Verbal cues for technique;Verbal cues for sequencing;Verbal cues for precautions/safety;Manual facilitation for weight shifting Gait Gait: Yes Gait Pattern: Impaired Gait Pattern: Step-to pattern;Decreased step length - right;Decreased step length - left;Decreased stride length;Step-through pattern;Decreased dorsiflexion - left;Decreased dorsiflexion - right;Decreased weight shift to right;Left foot flat;Right foot flat;Ataxic;Shuffle;Poor foot clearance - left;Poor foot clearance - right;Lateral trunk lean to left;Decreased trunk rotation Gait velocity: decreased Stairs / Additional Locomotion Stairs: Yes Stairs Assistance: 3: Mod assist Stairs Assistance Details: Manual facilitation for placement;Verbal cues for gait pattern;Manual facilitation for weight shifting;Verbal cues for precautions/safety;Verbal cues for technique;Verbal cues for sequencing Stair Management Technique: Two rails;Step to pattern;Forwards Number of Stairs:  8 Height of Stairs: 3 Architect: Yes Wheelchair Assistance: 5: Investment banker, operational Details: Verbal cues for technique;Verbal cues for Astronomer: Both upper extremities Wheelchair Parts Management: Needs assistance Distance: 40 ft  Trunk/Postural Assessment  Cervical Assessment Cervical Assessment: Exceptions to [facility] (forward head) Thoracic Assessment Thoracic Assessment: Exceptions to [facility] (increased kyphotic posture) Lumbar Assessment Lumbar Assessment: Exceptions to [facility] (posterior pelvic tilt) Postural Control Postural Control: Deficits on evaluation (impaired; reduced balance strategy for fall prevention)  Balance Balance Balance Assessed: Yes Static Standing Balance Static Standing - Balance Support: Bilateral upper extremity supported Static Standing - Level of Assistance: 4: Min assist Extremity Assessment  RUE Assessment RUE Assessment: Within Functional Limits LUE Assessment LUE Assessment: Within Functional Limits RLE Assessment RLE Assessment: Exceptions to [facility] RLE Strength RLE Overall Strength: Deficits Right Hip Flexion: 3+/5 Right Knee Flexion: 4+/5 Right Knee Extension: 4+/5 Right Ankle Dorsiflexion: 4/5 Right Ankle Plantar Flexion: 4/5 Right Ankle Inversion: 5/5 Right Ankle Eversion: 5/5 LLE Assessment LLE Assessment: Exceptions to [facility] LLE Strength Left Hip Flexion: 3-/5 Left Knee Flexion: 4/5 Left Knee Extension: 4/5 Left Ankle Dorsiflexion: 4+/5 Left Ankle Plantar Flexion: 4+/5   See Function Navigator for Current Functional Status.   Refer to Care Plan for Long Term Goals  Recommendations for other services: Neuropsych and Therapeutic Recreation  Kitchen group, Stress management and Outing/community reintegration  Discharge Criteria: Patient will be discharged from PT if patient refuses treatment 3 consecutive times without medical reason, if treatment goals not met, if there is  a change in medical status, if patient makes no progress towards goals or if patient is discharged from hospital.  The above assessment, treatment plan, treatment alternatives and goals were discussed and mutually agreed upon: by patient  Rosendo Gros 03/11/2016, 12:40 PM

## 2016-03-11 NOTE — Progress Notes (Signed)
Subjective/Complaints: No issues overnite Looking forward to starting therapy  ROS  Objective: Vital Signs: Blood pressure (!) 169/79, pulse 73, temperature 98 F (36.7 C), temperature source Oral, resp. rate 18, height '5\' 7"'  (1.702 m), weight (!) 169.8 kg (374 lb 6.4 oz), SpO2 98 %. No results found. Results for orders placed or performed during the hospital encounter of 03/10/16 (from the past 72 hour(s))  Urinalysis, Routine w reflex microscopic     Status: Abnormal   Collection Time: 03/10/16  8:37 PM  Result Value Ref Range   Color, Urine YELLOW YELLOW   APPearance HAZY (A) CLEAR   Specific Gravity, Urine 1.015 1.005 - 1.030   pH 7.0 5.0 - 8.0   Glucose, UA NEGATIVE NEGATIVE mg/dL   Hgb urine dipstick MODERATE (A) NEGATIVE   Bilirubin Urine NEGATIVE NEGATIVE   Ketones, ur NEGATIVE NEGATIVE mg/dL   Protein, ur NEGATIVE NEGATIVE mg/dL   Nitrite NEGATIVE NEGATIVE   Leukocytes, UA LARGE (A) NEGATIVE   RBC / HPF 0-5 0 - 5 RBC/hpf   WBC, UA TOO NUMEROUS TO COUNT 0 - 5 WBC/hpf   Bacteria, UA FEW (A) NONE SEEN   Squamous Epithelial / LPF 0-5 (A) NONE SEEN   Mucous PRESENT    Hyaline Casts, UA PRESENT   CBC WITH DIFFERENTIAL     Status: None   Collection Time: 03/11/16  6:00 AM  Result Value Ref Range   WBC 8.1 4.0 - 10.5 K/uL   RBC 4.42 3.87 - 5.11 MIL/uL   Hemoglobin 13.0 12.0 - 15.0 g/dL   HCT 42.0 36.0 - 46.0 %   MCV 95.0 78.0 - 100.0 fL   MCH 29.4 26.0 - 34.0 pg   MCHC 31.0 30.0 - 36.0 g/dL   RDW 14.3 11.5 - 15.5 %   Platelets 204 150 - 400 K/uL   Neutrophils Relative % 63 %   Neutro Abs 5.2 1.7 - 7.7 K/uL   Lymphocytes Relative 25 %   Lymphs Abs 2.0 0.7 - 4.0 K/uL   Monocytes Relative 6 %   Monocytes Absolute 0.5 0.1 - 1.0 K/uL   Eosinophils Relative 5 %   Eosinophils Absolute 0.4 0.0 - 0.7 K/uL   Basophils Relative 1 %   Basophils Absolute 0.0 0.0 - 0.1 K/uL  Comprehensive metabolic panel     Status: Abnormal   Collection Time: 03/11/16  6:00 AM  Result  Value Ref Range   Sodium 139 135 - 145 mmol/L   Potassium 4.6 3.5 - 5.1 mmol/L   Chloride 107 101 - 111 mmol/L   CO2 22 22 - 32 mmol/L   Glucose, Bld 120 (H) 65 - 99 mg/dL   BUN 19 6 - 20 mg/dL   Creatinine, Ser 0.95 0.44 - 1.00 mg/dL   Calcium 9.2 8.9 - 10.3 mg/dL   Total Protein 6.5 6.5 - 8.1 g/dL   Albumin 3.2 (L) 3.5 - 5.0 g/dL   AST 22 15 - 41 U/L   ALT 29 14 - 54 U/L   Alkaline Phosphatase 87 38 - 126 U/L   Total Bilirubin 0.5 0.3 - 1.2 mg/dL   GFR calc non Af Amer >60 >60 mL/min   GFR calc Af Amer >60 >60 mL/min    Comment: (NOTE) The eGFR has been calculated using the CKD EPI equation. This calculation has not been validated in all clinical situations. eGFR's persistently <60 mL/min signify possible Chronic Kidney Disease.    Anion gap 10 5 - 15  General: No acute distress, Morbid obesity Mood and affect are appropriate Heart: Regular rate and rhythm no rubs murmurs or extra sounds Lungs: Clear to auscultation, breathing unlabored, no rales or wheezes Abdomen: Positive bowel sounds, soft nontender to palpation, nondistended Extremities: No clubbing, cyanosis, or edema Skin: No evidence of breakdown, no evidence of rash Neurologic: , motor strength is 5/5 in bilateral deltoid, bicep, tricep, grip,4- hip flexor, knee extensors, ankle dorsiflexor and plantar flexor  Cerebellar exam normal finger to nose to finger right side, mild/moderate dysmetria on left side Musculoskeletal: no pain with  range of motion in all 4 extremities. No joint swelling  Assessment/Plan: 1. Functional deficits secondary to Left cerebellar infarct which require 3+ hours per day of interdisciplinary therapy in a comprehensive inpatient rehab setting. Physiatrist is providing close team supervision and 24 hour management of active medical problems listed below. Physiatrist and rehab team continue to assess barriers to discharge/monitor patient progress toward functional and medical  goals. FIM:                   Function - Comprehension Comprehension: Auditory Comprehension assist level: Follows complex conversation/direction with no assist  Function - Expression Expression: Verbal Expression assist level: Expresses complex ideas: With no assist  Function - Social Interaction Social Interaction assist level: Interacts appropriately with others - No medications needed.  Function - Problem Solving Problem solving assist level: Solves complex problems: Recognizes & self-corrects  Function - Memory Memory assist level: Complete Independence: No helper Patient normally able to recall (first 3 days only): Current season, Location of own room, Staff names and faces, That he or she is in a hospital  Medical Problem List and Plan: 1. Ataxia and functional deficitssecondary to left cerebellar infarct -CIR evals PT, OT 2. DVT Prophylaxis/Anticoagulation: Pharmaceutical: Lovenox 3. Pain Management: N/A 4. Mood: Team to provide ego support. LCSW to follow for evaluation and support.  5. Neuropsych: This patient iscapable of making decisions on herown behalf. 6. Skin/Wound Care: Routine pressure relief measures.  7. Fluids/Electrolytes/Nutrition: Monitor I/O. Check lytes in am.  8. HTN: Monitor BP bid. Permissive HTN for 5-7 days.--resume Lasix/metoprolol as indicated.  9. OSA: Continue to encourage consistent use of CPAP 10. Morbid obesity: BMI 55.87. Will consult dietician to educate on CM/Heart healthy diet. Encourage appropriate diet and exercise to help promote weight loss to help promote health,  11. Pyuria: Will check urine culture and start septra DS.  12. Prediabetes: Hgb A1C-6.3. CM diet reinforced. Monitor BS ac/hs for trends.  10. CAD s/p CABG: On ASA and Lipitor   LOS (Days) 1 A FACE TO FACE EVALUATION WAS PERFORMED  Megan Atkinson 03/11/2016, 7:47 AM

## 2016-03-12 ENCOUNTER — Inpatient Hospital Stay (HOSPITAL_COMMUNITY): Payer: Medicare Other | Admitting: Occupational Therapy

## 2016-03-12 DIAGNOSIS — K5901 Slow transit constipation: Secondary | ICD-10-CM

## 2016-03-12 NOTE — Progress Notes (Signed)
Occupational Therapy Session Note  Patient Details  Name: Megan NielsenBeverley Proto MRN: [id number] Date of Birth: 12/01/1950  Today's Date: 03/12/2016 OT Individual Time: 1105-1205 OT Individual Time Calculation (min): 60 min    Short Term Goals: Week 1:  OT Short Term Goal 1 (Week 1): Pt will complete toilet transfer with LRAD and Max A OT Short Term Goal 2 (Week 1): Pt will don socks with use of sock aide and supervision  OT Short Term Goal 3 (Week 1): Pt will complete sit<stand for LB dressing with Max A at sink or with RW OT Short Term Goal 4 (Week 1): Pt will complete bathing with Min A with AE as needed   Skilled Therapeutic Interventions/Progress Updates: Pt was sitting in recliner at time of arrival, agreeable to tx. BP assessed due to shakiness and previous documentation from RN. BP WFL (149/59) and pt reporting to be asymptomatic, though exhibited slightly increased tremor activity with bilateral UEs. She declined shower today, so BADLs took place w/c level at sink with setup. Mod A sit<stand in Stedy from low recliner (reported still being fearful with use of RW). She was provided with LH sponge and required Min A overall for bathing. Sit<stand at sink completed with overall Mod A and calming cues due to increased feelings of anxiety when standing without Stedy lift. Due to this, she required bilateral support on sink when standing and pericare/clothing mgt completed by therapist. Sock aide training initiated with pt able to don both socks. After completing oral care with setup, pt was returned to recliner via Stedy with Min A. Pt encouraged to attempt shower this week as well as begin transferring with RW during ADLs. She verbalized understanding. She was left with bilateral LEs elevated and all needs within reach at time of departure.      Therapy Documentation Precautions:  Precautions Precautions: Fall Precaution Comments:  (Left hemiparesis, vertigo ) Restrictions Weight Bearing  Restrictions: No  Pain: No c/o pain during session    ADL: ADL ADL Comments: Please see functional navigator for ADL status    See Function Navigator for Current Functional Status.   Therapy/Group: Individual Therapy  [name] A [name] 03/12/2016, 12:45 PM

## 2016-03-12 NOTE — Progress Notes (Signed)
Subjective/Complaints: No issues overnite , had lg BM after supp this am Worked on steps with PT yesterday   ROS Negative CP, SOB, N/V/D Objective: Vital Signs: Blood pressure (!) 148/76, pulse 87, temperature 99.2 F (37.3 C), temperature source Oral, resp. rate 17, height _0  (1.702 m), weight (!) 169.8 kg (374 lb 6.4 oz), SpO2 97 %. No results found. Results for orders placed or performed during the hospital encounter of 03/10/16 (from the past 72 hour(s))  Urinalysis, Routine w reflex microscopic     Status: Abnormal   Collection Time: 03/10/16  8:37 PM  Result Value Ref Range   Color, Urine YELLOW YELLOW   APPearance HAZY (A) CLEAR   Specific Gravity, Urine 1.015 1.005 - 1.030   pH 7.0 5.0 - 8.0   Glucose, UA NEGATIVE NEGATIVE mg/dL   Hgb urine dipstick MODERATE (A) NEGATIVE   Bilirubin Urine NEGATIVE NEGATIVE   Ketones, ur NEGATIVE NEGATIVE mg/dL   Protein, ur NEGATIVE NEGATIVE mg/dL   Nitrite NEGATIVE NEGATIVE   Leukocytes, UA LARGE (A) NEGATIVE   RBC / HPF 0-5 0 - 5 RBC/hpf   WBC, UA TOO NUMEROUS TO COUNT 0 - 5 WBC/hpf   Bacteria, UA FEW (A) NONE SEEN   Squamous Epithelial / LPF 0-5 (A) NONE SEEN   Mucous PRESENT    Hyaline Casts, UA PRESENT   CBC WITH DIFFERENTIAL     Status: None   Collection Time: 03/11/16  6:00 AM  Result Value Ref Range   WBC 8.1 4.0 - 10.5 K/uL   RBC 4.42 3.87 - 5.11 MIL/uL   Hemoglobin 13.0 12.0 - 15.0 g/dL   HCT 42.0 36.0 - 46.0 %   MCV 95.0 78.0 - 100.0 fL   MCH 29.4 26.0 - 34.0 pg   MCHC 31.0 30.0 - 36.0 g/dL   RDW 14.3 11.5 - 15.5 %   Platelets 204 150 - 400 K/uL   Neutrophils Relative % 63 %   Neutro Abs 5.2 1.7 - 7.7 K/uL   Lymphocytes Relative 25 %   Lymphs Abs 2.0 0.7 - 4.0 K/uL   Monocytes Relative 6 %   Monocytes Absolute 0.5 0.1 - 1.0 K/uL   Eosinophils Relative 5 %   Eosinophils Absolute 0.4 0.0 - 0.7 K/uL   Basophils Relative 1 %   Basophils Absolute 0.0 0.0 - 0.1 K/uL  Comprehensive metabolic panel     Status:  Abnormal   Collection Time: 03/11/16  6:00 AM  Result Value Ref Range   Sodium 139 135 - 145 mmol/L   Potassium 4.6 3.5 - 5.1 mmol/L   Chloride 107 101 - 111 mmol/L   CO2 22 22 - 32 mmol/L   Glucose, Bld 120 (H) 65 - 99 mg/dL   BUN 19 6 - 20 mg/dL   Creatinine, Ser 0.95 0.44 - 1.00 mg/dL   Calcium 9.2 8.9 - 10.3 mg/dL   Total Protein 6.5 6.5 - 8.1 g/dL   Albumin 3.2 (L) 3.5 - 5.0 g/dL   AST 22 15 - 41 U/L   ALT 29 14 - 54 U/L   Alkaline Phosphatase 87 38 - 126 U/L   Total Bilirubin 0.5 0.3 - 1.2 mg/dL   GFR calc non Af Amer >60 >60 mL/min   GFR calc Af Amer >60 >60 mL/min    Comment: (NOTE) The eGFR has been calculated using the CKD EPI equation. This calculation has not been validated in all clinical situations. eGFR's persistently <60 mL/min signify possible Chronic Kidney Disease.  Anion gap 10 5 - 15      General: No acute distress, Morbid obesity Mood and affect are appropriate Heart: Regular rate and rhythm no rubs murmurs or extra sounds Lungs: Clear to auscultation, breathing unlabored, no rales or wheezes Abdomen: Positive bowel sounds, soft nontender to palpation, nondistended Extremities: No clubbing, cyanosis, or edema Skin: No evidence of breakdown, no evidence of rash Neurologic: , motor strength is 5/5 in bilateral deltoid, bicep, tricep, grip,4 hip flexor, knee extensors, ankle dorsiflexor and plantar flexor  Cerebellar exam normal finger to nose to finger right side, mild/moderate dysmetria on left side Musculoskeletal: no pain with  range of motion in all 4 extremities. No joint swelling  Assessment/Plan: 1. Functional deficits secondary to Left cerebellar infarct which require 3+ hours per day of interdisciplinary therapy in a comprehensive inpatient rehab setting. Physiatrist is providing close team supervision and 24 hour management of active medical problems listed below. Physiatrist and rehab team continue to assess barriers to discharge/monitor  patient progress toward functional and medical goals. FIM: Function - Bathing Position: Sitting EOB Body parts bathed by patient: Right arm, Left arm, Chest, Abdomen, Front perineal area, Right upper leg, Left upper leg Body parts bathed by helper: Buttocks, Right lower leg, Left lower leg, Back Assist Level: Touching or steadying assistance(Pt > 75%)  Function- Upper Body Dressing/Undressing What is the patient wearing?: Pull over shirt/dress Pull over shirt/dress - Perfomed by patient: Thread/unthread right sleeve, Thread/unthread left sleeve, Put head through opening, Pull shirt over trunk Assist Level: Set up Set up : To obtain clothing/put away Function - Lower Body Dressing/Undressing What is the patient wearing?: Underwear, Pants, Non-skid slipper socks Position: Sitting EOB Underwear - Performed by patient: Thread/unthread right underwear leg Underwear - Performed by helper: Thread/unthread left underwear leg, Pull underwear up/down Pants- Performed by patient: Thread/unthread right pants leg, Thread/unthread left pants leg Pants- Performed by helper: Pull pants up/down Non-skid slipper socks- Performed by helper: Don/doff right sock, Don/doff left sock Assist for footwear: Dependant Assist for lower body dressing:  Charlaine Dalton)  Function - Toileting Toileting activity did not occur: No continent bowel/bladder event Toileting steps completed by patient: Adjust clothing prior to toileting Toileting steps completed by helper: Adjust clothing after toileting  Function - Toilet Transfers Toilet transfer assistive device: Mechanical lift Mechanical lift: Stedy Assist level to toilet: Total assist (Pt < 25%) Assist level from toilet: Total assist (Pt < 25%)  Function - Chair/bed transfer Chair/bed transfer method: Other Chair/bed transfer assist level: Touching or steadying assistance (Pt > 75%) Chair/bed transfer assistive device: Mechanical lift Mechanical lift: Stedy Chair/bed  transfer details: Manual facilitation for placement, Manual facilitation for weight shifting, Verbal cues for technique, Verbal cues for sequencing, Verbal cues for precautions/safety, Verbal cues for safe use of DME/AE  Function - Locomotion: Wheelchair Will patient use wheelchair at discharge?: Yes Type: Manual Max wheelchair distance: 40 ft Assist Level: Supervision or verbal cues Function - Locomotion: Ambulation Assistive device: Walker-rolling Max distance: 12 ft Assist level: 2 helpers Assist level: 2 helpers Walk 50 feet with 2 turns activity did not occur: Safety/medical concerns Walk 150 feet activity did not occur: Safety/medical concerns Walk 10 feet on uneven surfaces activity did not occur: Safety/medical concerns  Function - Comprehension Comprehension: Auditory Comprehension assist level: Follows complex conversation/direction with no assist  Function - Expression Expression: Verbal Expression assist level: Expresses complex ideas: With no assist  Function - Social Interaction Social Interaction assist level: Interacts appropriately with others with medication or  extra time (anti-anxiety, antidepressant).  Function - Problem Solving Problem solving assist level: Solves complex problems: With extra time  Function - Memory Memory assist level: Assistive device: No helper Patient normally able to recall (first 3 days only): Current season, Location of own room, Staff names and faces, That he or she is in a hospital  Medical Problem List and Plan: 1. Ataxia and functional deficitssecondary to left cerebellar infarct  -CIR level PT, OT 2. DVT Prophylaxis/Anticoagulation: Pharmaceutical: Lovenox 3. Pain Management: N/A 4. Mood: Team to provide ego support. LCSW to follow for evaluation and support.  5. Neuropsych: This patient iscapable of making decisions on herown behalf. 6. Skin/Wound Care: Routine pressure relief measures.  7.  Fluids/Electrolytes/Nutrition: Monitor I/O. Lytes nl  8. HTN: Monitor BP bid. Permissive HTN for 5-7 days.--resume Lasix/metoprolol as indicated.  9. OSA: Continue to encourage consistent use of CPAP 10. Morbid obesity: BMI 55.87. Will consult dietician to educate on CM/Heart healthy diet. Encourage appropriate diet and exercise to help promote weight loss to help promote health,  11. Pyuria: Will check urine culture and start septra DS.  12. Prediabetes: Hgb A1C-6.3. CM diet reinforced. Monitor BS ac/hs for trends.  33. CAD s/p CABG: On ASA and Lipitor   14.  Constipation relieved by supp, cont miralax qd LOS (Days) 2 A FACE TO FACE EVALUATION WAS PERFORMED  KIRSTEINS,ANDREW E 03/12/2016, 8:38 AM

## 2016-03-13 ENCOUNTER — Inpatient Hospital Stay (HOSPITAL_COMMUNITY): Payer: Medicare Other | Admitting: Speech Pathology

## 2016-03-13 ENCOUNTER — Inpatient Hospital Stay (HOSPITAL_COMMUNITY): Payer: Medicare Other | Admitting: Physical Therapy

## 2016-03-13 ENCOUNTER — Inpatient Hospital Stay (HOSPITAL_COMMUNITY): Payer: Medicare Other | Admitting: Occupational Therapy

## 2016-03-13 DIAGNOSIS — R269 Unspecified abnormalities of gait and mobility: Secondary | ICD-10-CM

## 2016-03-13 DIAGNOSIS — I69398 Other sequelae of cerebral infarction: Secondary | ICD-10-CM

## 2016-03-13 LAB — GLUCOSE, CAPILLARY
GLUCOSE-CAPILLARY: 109 mg/dL — AB (ref 65–99)
GLUCOSE-CAPILLARY: 98 mg/dL (ref 65–99)
GLUCOSE-CAPILLARY: 105 mg/dL — AB (ref 65–99)
GLUCOSE-CAPILLARY: 116 mg/dL — AB (ref 65–99)
GLUCOSE-CAPILLARY: 103 mg/dL — AB (ref 65–99)
GLUCOSE-CAPILLARY: 87 mg/dL (ref 65–99)
Glucose-Capillary: 100 mg/dL — ABNORMAL HIGH (ref 65–99)
Glucose-Capillary: 124 mg/dL — ABNORMAL HIGH (ref 65–99)
Glucose-Capillary: 90 mg/dL (ref 65–99)
Glucose-Capillary: 106 mg/dL — ABNORMAL HIGH (ref 65–99)
Glucose-Capillary: 106 mg/dL — ABNORMAL HIGH (ref 65–99)
Glucose-Capillary: 126 mg/dL — ABNORMAL HIGH (ref 65–99)

## 2016-03-13 MED ORDER — FLUTICASONE PROPIONATE 50 MCG/ACT NA SUSP
1.0000 | Freq: Every day | NASAL | Status: DC
  Administered 2016-03-14 – 2016-03-30 (×15): 1 via NASAL
  Filled 2016-03-13 (×2): qty 16

## 2016-03-13 MED ORDER — BUSPIRONE HCL 5 MG PO TABS
5.0000 mg | ORAL_TABLET | Freq: Two times a day (BID) | ORAL | Status: DC
  Administered 2016-03-13 – 2016-03-15 (×4): 5 mg via ORAL
  Filled 2016-03-13 (×4): qty 1

## 2016-03-13 MED ORDER — FLUTICASONE PROPIONATE 50 MCG/ACT NA SUSP
1.0000 | Freq: Every day | NASAL | Status: DC
  Filled 2016-03-13: qty 16

## 2016-03-13 NOTE — Progress Notes (Signed)
Subjective/Complaints:  Still notes clumsiness in LUE>LLE   ROS Negative CP, SOB, N/V/D Objective: Vital Signs: Blood pressure (!) 156/67, pulse 78, temperature 97.8 F (36.6 C), temperature source Oral, resp. rate 18, height 5' 7" (1.702 m), weight (!) 169.8 kg (374 lb 6.4 oz), SpO2 97 %. No results found. Results for orders placed or performed during the hospital encounter of 03/10/16 (from the past 72 hour(s))  Urinalysis, Routine w reflex microscopic     Status: Abnormal   Collection Time: 03/10/16  8:37 PM  Result Value Ref Range   Color, Urine YELLOW YELLOW   APPearance HAZY (A) CLEAR   Specific Gravity, Urine 1.015 1.005 - 1.030   pH 7.0 5.0 - 8.0   Glucose, UA NEGATIVE NEGATIVE mg/dL   Hgb urine dipstick MODERATE (A) NEGATIVE   Bilirubin Urine NEGATIVE NEGATIVE   Ketones, ur NEGATIVE NEGATIVE mg/dL   Protein, ur NEGATIVE NEGATIVE mg/dL   Nitrite NEGATIVE NEGATIVE   Leukocytes, UA LARGE (A) NEGATIVE   RBC / HPF 0-5 0 - 5 RBC/hpf   WBC, UA TOO NUMEROUS TO COUNT 0 - 5 WBC/hpf   Bacteria, UA FEW (A) NONE SEEN   Squamous Epithelial / LPF 0-5 (A) NONE SEEN   Mucous PRESENT    Hyaline Casts, UA PRESENT   Urine culture     Status: Abnormal (Preliminary result)   Collection Time: 03/10/16  8:37 PM  Result Value Ref Range   Specimen Description URINE, CLEAN CATCH    Special Requests NONE    Culture (A)     >=100,000 COLONIES/mL ESCHERICHIA COLI SUSCEPTIBILITIES TO FOLLOW    Report Status PENDING   CBC WITH DIFFERENTIAL     Status: None   Collection Time: 03/11/16  6:00 AM  Result Value Ref Range   WBC 8.1 4.0 - 10.5 K/uL   RBC 4.42 3.87 - 5.11 MIL/uL   Hemoglobin 13.0 12.0 - 15.0 g/dL   HCT 42.0 36.0 - 46.0 %   MCV 95.0 78.0 - 100.0 fL   MCH 29.4 26.0 - 34.0 pg   MCHC 31.0 30.0 - 36.0 g/dL   RDW 14.3 11.5 - 15.5 %   Platelets 204 150 - 400 K/uL   Neutrophils Relative % 63 %   Neutro Abs 5.2 1.7 - 7.7 K/uL   Lymphocytes Relative 25 %   Lymphs Abs 2.0 0.7 - 4.0  K/uL   Monocytes Relative 6 %   Monocytes Absolute 0.5 0.1 - 1.0 K/uL   Eosinophils Relative 5 %   Eosinophils Absolute 0.4 0.0 - 0.7 K/uL   Basophils Relative 1 %   Basophils Absolute 0.0 0.0 - 0.1 K/uL  Comprehensive metabolic panel     Status: Abnormal   Collection Time: 03/11/16  6:00 AM  Result Value Ref Range   Sodium 139 135 - 145 mmol/L   Potassium 4.6 3.5 - 5.1 mmol/L   Chloride 107 101 - 111 mmol/L   CO2 22 22 - 32 mmol/L   Glucose, Bld 120 (H) 65 - 99 mg/dL   BUN 19 6 - 20 mg/dL   Creatinine, Ser 0.95 0.44 - 1.00 mg/dL   Calcium 9.2 8.9 - 10.3 mg/dL   Total Protein 6.5 6.5 - 8.1 g/dL   Albumin 3.2 (L) 3.5 - 5.0 g/dL   AST 22 15 - 41 U/L   ALT 29 14 - 54 U/L   Alkaline Phosphatase 87 38 - 126 U/L   Total Bilirubin 0.5 0.3 - 1.2 mg/dL   GFR calc   non Af Amer >60 >60 mL/min   GFR calc Af Amer >60 >60 mL/min    Comment: (NOTE) The eGFR has been calculated using the CKD EPI equation. This calculation has not been validated in all clinical situations. eGFR's persistently <60 mL/min signify possible Chronic Kidney Disease.    Anion gap 10 5 - 15      General: No acute distress, Morbid obesity Mood and affect are appropriate Heart: Regular rate and rhythm no rubs murmurs or extra sounds Lungs: Clear to auscultation, breathing unlabored, no rales or wheezes Abdomen: Positive bowel sounds, soft nontender to palpation, nondistended Extremities: No clubbing, cyanosis, or edema Skin: No evidence of breakdown, no evidence of rash Neurologic: , motor strength is 5/5 in bilateral deltoid, bicep, tricep, grip,4 hip flexor, knee extensors, ankle dorsiflexor and plantar flexor  Cerebellar exam normal finger to nose to finger right side, mild/moderate dysmetria on left side Musculoskeletal: no pain with  range of motion in all 4 extremities. No joint swelling  Assessment/Plan: 1. Functional deficits secondary to Left cerebellar infarct which require 3+ hours per day of  interdisciplinary therapy in a comprehensive inpatient rehab setting. Physiatrist is providing close team supervision and 24 hour management of active medical problems listed below. Physiatrist and rehab team continue to assess barriers to discharge/monitor patient progress toward functional and medical goals. FIM: Function - Bathing Position: Wheelchair/chair at sink Body parts bathed by patient: Right arm, Left arm, Chest, Abdomen, Front perineal area, Right upper leg, Left upper leg, Left lower leg, Right lower leg Body parts bathed by helper: Buttocks Assist Level: Touching or steadying assistance(Pt > 75%) (with LH sponge)  Function- Upper Body Dressing/Undressing What is the patient wearing?: Pull over shirt/dress Pull over shirt/dress - Perfomed by patient: Thread/unthread right sleeve, Thread/unthread left sleeve, Put head through opening, Pull shirt over trunk Assist Level: Set up Set up : To obtain clothing/put away Function - Lower Body Dressing/Undressing What is the patient wearing?: Pants, Non-skid slipper socks Position: Wheelchair/chair at sink Underwear - Performed by patient: Thread/unthread right underwear leg Underwear - Performed by helper: Thread/unthread left underwear leg, Pull underwear up/down Pants- Performed by patient: Thread/unthread right pants leg, Thread/unthread left pants leg Pants- Performed by helper: Pull pants up/down Non-skid slipper socks- Performed by patient: Don/doff right sock, Don/doff left sock (with sock aide) Non-skid slipper socks- Performed by helper: Don/doff right sock, Don/doff left sock Assist for footwear: Supervision/touching assist Assist for lower body dressing:  (Min-Mod A)  Function - Toileting Toileting activity did not occur: No continent bowel/bladder event Toileting steps completed by patient: Adjust clothing prior to toileting Toileting steps completed by helper: Adjust clothing prior to toileting, Performs perineal  hygiene, Adjust clothing after toileting  Function - Toilet Transfers Toilet transfer assistive device: Mechanical lift Mechanical lift: Stedy Assist level to toilet: Total assist (Pt < 25%) Assist level from toilet: Total assist (Pt < 25%)  Function - Chair/bed transfer Chair/bed transfer method: Other Chair/bed transfer assist level: Touching or steadying assistance (Pt > 75%) Chair/bed transfer assistive device: Mechanical lift Mechanical lift: Stedy Chair/bed transfer details: Manual facilitation for placement, Manual facilitation for weight shifting, Verbal cues for technique, Verbal cues for sequencing, Verbal cues for precautions/safety, Verbal cues for safe use of DME/AE  Function - Locomotion: Wheelchair Will patient use wheelchair at discharge?: Yes Type: Manual Max wheelchair distance: 40 ft Assist Level: Supervision or verbal cues Function - Locomotion: Ambulation Assistive device: Walker-rolling Max distance: 12 ft Assist level: 2 helpers Assist level: 2   helpers Walk 50 feet with 2 turns activity did not occur: Safety/medical concerns Walk 150 feet activity did not occur: Safety/medical concerns Walk 10 feet on uneven surfaces activity did not occur: Safety/medical concerns  Function - Comprehension Comprehension: Auditory Comprehension assist level: Follows complex conversation/direction with no assist  Function - Expression Expression: Verbal Expression assist level: Expresses complex ideas: With no assist  Function - Social Interaction Social Interaction assist level: Interacts appropriately with others - No medications needed.  Function - Problem Solving Problem solving assist level: Solves complex problems: Recognizes & self-corrects  Function - Memory Memory assist level: Complete Independence: No helper Patient normally able to recall (first 3 days only): Current season, Location of own room, Staff names and faces, That he or she is in a  hospital  Medical Problem List and Plan: 1. Ataxia and functional deficitssecondary to left cerebellar infarct  -CIR level PT, OT 2. DVT Prophylaxis/Anticoagulation: Pharmaceutical: Lovenox 3. Pain Management: N/A 4. Mood: Team to provide ego support. LCSW to follow for evaluation and support.  5. Neuropsych: This patient iscapable of making decisions on herown behalf. 6. Skin/Wound Care: Routine pressure relief measures.  7. Fluids/Electrolytes/Nutrition: Monitor I/O. Lytes nl  8. HTN: Monitor BP bid. Permissive HTN for 5-7 days.--resume Lasix/metoprolol as indicated.  9. OSA: Continue to encourage consistent use of CPAP 10. Morbid obesity: BMI 55.87. Will consult dietician to educate on CM/Heart healthy diet. Encourage appropriate diet and exercise to help promote weight loss to help promote health,  11. Pyuria: Ecoli UTI on Bactrim, no evidence of Side effect 12. Prediabetes: Hgb A1C-6.3. CM diet reinforced. Monitor BS ac/hs for trends.  CBG (last 3)   Recent Labs  03/10/16 0859 03/10/16 1148  GLUCAP 131* 102*    13. CAD s/p CABG: On ASA and Lipitor   14.  Constipation relieved by supp, cont miralax qd LOS (Days) 3 A FACE TO FACE EVALUATION WAS PERFORMED  Jandy Brackens E 03/13/2016, 7:29 AM

## 2016-03-13 NOTE — Progress Notes (Signed)
Patient information reviewed and entered into eRehab system by Sissy Goetzke, RN, CRRN, PPS Coordinator.  Information including medical coding and functional independence measure will be reviewed and updated through discharge.     Per nursing patient was given "Data Collection Information Summary for Patients in Inpatient Rehabilitation Facilities with attached "Privacy Act Statement-Health Care Records" upon admission.  

## 2016-03-13 NOTE — Care Management Note (Signed)
Inpatient Rehabilitation Center Individual Statement of Services  Patient Name:  Megan NielsenBeverley Atkinson  Date:  03/13/2016  Welcome to the [facility]patient Rehabilitation [facility].  Our goal is to provide you with an individualized program based on your diagnosis and situation, designed to meet your specific needs.  With this comprehensive rehabilitation program, you will be expected to participate in at least 3 hours of rehabilitation therapies Monday-Friday, with modified therapy programming on the weekends.  Your rehabilitation program will include the following services:  Physical Therapy (PT), Occupational Therapy (OT), Speech Therapy (ST), 24 hour per day rehabilitation nursing, Therapeutic Recreaction (TR), Neuropsychology, Case Management (Social Worker), Rehabilitation Medicine, Nutrition Services and Pharmacy Services  Weekly team conferences will be held on Wednesday to discuss your progress.  Your Social Worker will talk with you frequently to get your input and to update you on team discussions.  Team conferences with you and your family in attendance may also be held.  Expected length of stay: 16-18 days Overall anticipated outcome: supervision cueing  Depending on your progress and recovery, your program may change. Your Social Worker will coordinate services and will keep you informed of any changes. Your Social Worker's name and contact numbers are listed  below.  The following services may also be recommended but are not provided by the Inpatient Rehabilitation Center:   Driving Evaluations  Home Health Rehabiltiation Services  Outpatient Rehabilitation Services  Vocational Rehabilitation   Arrangements will be made to provide these services after discharge if needed.  Arrangements include referral to agencies that provide these services.  Your insurance has been verified to be:  [employer] medicare Your primary doctor is:  Sanda Lingerhomas Jones  Pertinent information will be shared with your doctor  and your insurance company.  Social Worker:  Dossie DerBecky Davari Lopes, SW [phone] or [phone]  Information discussed with and copy given to patient by: Lucy Chrisupree, Destinie Thornsberry G, 03/13/2016, 8:44 AM

## 2016-03-13 NOTE — Plan of Care (Signed)
Problem: Food- and Nutrition-Related Knowledge Deficit (NB-1.1) Goal: Nutrition education Formal process to instruct or train a patient/client in a skill or to impart knowledge to help patients/clients voluntarily manage or modify food choices and eating behavior to maintain or improve health. Outcome: Completed/Met Date Met: 03/13/16  RD consulted for nutrition education.  Lab Results  Component Value Date   HGBA1C 6.3 (H) 03/07/2016    RD provided "Carbohydrate Counting for People with Diabetes" handout from the Academy of Nutrition and Dietetics. Pt reports previous knowledge on a diabetic diet as well as a heart healthy diet. Discussed different food groups and their effects on blood sugar, emphasizing carbohydrate-containing foods. Provided list of carbohydrates and recommended serving sizes of common foods.  Discussed importance of controlled and consistent carbohydrate intake throughout the day. Provided examples of ways to balance meals/snacks and encouraged intake of high-fiber, whole grain complex carbohydrates. Discussed diabetic friendly drink options.   RD additionally provided "Heart Healthy Nutrition Therapy" handout from the Academy of Nutrition and Dietetics. Provided examples on ways to decrease sodium and fat intake in diet. Discussed salt free seasonings. Discouraged intake of processed foods and use of salt shaker.   Teach back method used.  Expect good compliance.  Body mass index is 58.64 kg/m. Pt meets criteria for morbid obesity based on current BMI.  Current diet order is heart healthy/carbohydrate modifed, patient is consuming approximately 100% of meals at this time. Pt reports having a good appetite currently and PTA with no other difficulties. Labs and medications reviewed. No further nutrition interventions warranted at this time. RD contact information provided. If additional nutrition issues arise, please re-consult RD.  Corrin Parker, MS, RD, LDN Pager  # [phone] After hours/ weekend pager # [phone]

## 2016-03-13 NOTE — IPOC Note (Signed)
Overall Plan of Care [facility](IPOC) Patient Details Name: Sunnie NielsenBeverley Caruthers MRN: [id number] DOB: 03-May-1950  Admitting Diagnosis: CVA  Hospital Problems: Principal Problem:   Ataxia, post-stroke Active Problems:   Cerebellar stroke, acute (HCC)   Gait disturbance, post-stroke     Functional Problem List: Nursing Bladder, Bowel, Edema, Endurance, Medication Management, Pain  PT Balance, Behavior, Edema, Endurance, Motor, Safety, Sensory  OT Safety, Motor, Balance  SLP    TR         Basic ADL's: OT Grooming, Bathing, Dressing, Toileting     Advanced  ADL's: OT Simple Meal Preparation, Laundry     Transfers: PT Bed Mobility, Bed to Chair, Set designerCar, Occupational psychologisturniture  OT Toilet, Research scientist (life sciences)Tub/Shower     Locomotion: PT Ambulation, Psychologist, prison and probation servicesWheelchair Mobility, Stairs     Additional Impairments: OT Fuctional Use of Upper Extremity  SLP None      TR      Anticipated Outcomes Item Anticipated Outcome  Self Feeding N/A  Swallowing      Basic self-care  Supervision-Mod I   Chief Financial OfficerToileting  Supervision    Bathroom Transfers Supervision   Bowel/Bladder  Minimal assist  Transfers  Supevision   Locomotion  Supervision with household  Communication     Cognition     Pain  2 or less  Safety/Judgment  Mod I   Therapy Plan: PT Intensity: Minimum of 1-2 x/day ,45 to 90 minutes PT Frequency: 5 out of 7 days PT Duration Estimated Length of Stay: 16-18 days OT Intensity: Minimum of 1-2 x/day, 45 to 90 minutes OT Frequency: 5 out of 7 days OT Duration/Estimated Length of Stay: 16-18 days         Team Interventions: Nursing Interventions Patient/Family Education, Bladder Management, Bowel Management, Disease Management/Prevention, Pain Management, Medication Management, Discharge Planning  PT interventions Ambulation/gait training, Balance/vestibular training, Community reintegration, Discharge planning, DME/adaptive equipment instruction, Functional mobility training, Neuromuscular re-education, Disease  management/prevention, Patient/family education, Psychosocial support, Stair training, Therapeutic Exercise, Therapeutic Activities, UE/LE Strength taining/ROM, UE/LE Coordination activities, Wheelchair propulsion/positioning  OT Interventions Warden/rangerBalance/vestibular training, Discharge planning, Self Care/advanced ADL retraining, Therapeutic Activities, UE/LE Coordination activities, Patient/family education, Therapeutic Exercise, Functional mobility training, DME/adaptive equipment instruction, Neuromuscular re-education, Psychosocial support, UE/LE Strength taining/ROM  SLP Interventions    TR Interventions    SW/CM Interventions Discharge Planning, Psychosocial Support, Patient/Family Education    Team Discharge Planning: Destination: PT-Home ,OT- Home , SLP-Home Projected Follow-up: PT-Home health PT, 24 hour supervision/assistance, OT-  24 hour supervision/assistance, SLP-None Projected Equipment Needs: PT-To be determined, OT- To be determined, SLP-None recommended by SLP Equipment Details: PT- , OT-  Patient/family involved in discharge planning: PT- Patient,  OT-Patient, SLP-Patient  MD ELOS: 17-20d Medical Rehab Prognosis:  Good Assessment:  66 y.o.femalewith history of CAD, HTN, morbid obesity who was admitted on 03/06/16 with vertigo and left sided weakness with numbness. CT head reviewed, showing left cerebellar infarct,thought to be subacute. TPA not administered. CTA head/neck no significant stenosis in neck and moderate focal stenosis left VA. 2D echo with eF 60-65% with mild to moderately calcified aortic valve and grade I diastolic dysfunction. BLE dopplers negative for DVT. Dr. Roda ShuttersXu felt that stroke due to atherosclerosis and multiple risk factors. He recommended ASA/Plavix for three months followed by plavix alone. MBS done 1/30 revealing mild pharyngeal dysphagia with penetration of thin therefore chin tuck with thin liquids recommended. Swallow re-eval done today showing  improvement and no need for chin tuck  Now requiring 24/7 Rehab RN,MD, as well as CIR level PT, OT and SLP.  Treatment team will focus on ADLs and mobility with goals set at Sup  See Team Conference Notes for weekly updates to the plan of care

## 2016-03-13 NOTE — Progress Notes (Signed)
Occupational Therapy Session Note  Patient Details  Name: Megan NielsenBeverley Atkinson MRN: [id number] Date of Birth: 11-10-1950  Today's Date: 03/13/2016 OT Individual Time: 1000-1100 OT Individual Time Calculation (min): 60 min    Short Term Goals: Week 1:  OT Short Term Goal 1 (Week 1): Pt will complete toilet transfer with LRAD and Max A OT Short Term Goal 2 (Week 1): Pt will don socks with use of sock aide and supervision  OT Short Term Goal 3 (Week 1): Pt will complete sit<stand for LB dressing with Max A at sink or with RW OT Short Term Goal 4 (Week 1): Pt will complete bathing with Min A with AE as needed   Skilled Therapeutic Interventions/Progress Updates:    Treatment session with focus on activity tolerance, sit > stand, and functional use of LUE.  Pt received in w/c declining bathing at shower level, but expressing desire to wash at sink and dress.  Pt completed bathing with setup assistance, except assist to wash buttocks and perineal area while standing with BUE support on sink.  Mod assist sit > stand with UE support due to fearfulness of falling.  Therapist provided encouragement and reassurance with pt willing to complete all tasks, however requiring frequent encouragement.  Required assist with LB dressing as pt unable to release BUE support from sink to pull pants over hips.  Engaged in sit > stand outside parallel bars with pt continuing to pull up from bars, utilized mirror for visual input for upright standing posture.  Pt able to maintain standing balance 1-2 mins before requesting seated rest break.  Engaged in 9 hole peg test with Rt: 36 seconds and Lt: 52 seconds with mild decreased coordination.  Engaged in fine motor control with poker chips with pt able to complete all tasks with mild increase in time.  Therapy Documentation Precautions:  Precautions Precautions: Fall Precaution Comments:  (Left hemiparesis, vertigo ) Restrictions Weight Bearing Restrictions: No (feeling just  a little tired) Pain:  Pt with no c/o pain  See Function Navigator for Current Functional Status.   Therapy/Group: Individual Therapy  Rosalio LoudHOXIE, [name] 03/13/2016, 12:16 PM

## 2016-03-13 NOTE — Progress Notes (Signed)
Physical Therapy Session Note  Patient Details  Name: Megan Atkinson MRN: [id number] Date of Birth: Dec 21, 1950  Today's Date: 03/13/2016 PT Individual Time: 1400-1500 PT Individual Time Calculation (min): 60 min   Short Term Goals: Week 1:  PT Short Term Goal 1 (Week 1): Pt will demonstrate sit-to-stand transfers Min A with RW consistently PT Short Term Goal 2 (Week 1): Pt will ambulate 25 ft Mod A with RW  PT Short Term Goal 3 (Week 1): Pt will perform stand pivot transfer Mod A with RW consistently PT Short Term Goal 4 (Week 1): Pt will initiate car transfer training  Skilled Therapeutic Interventions/Progress Updates:    no c/o pain.  Session focus on activity tolerance, w/c propulsion, and transfers.    Pt transfers to/from recliner with stedy and mod assist to rise.  W/C propulsion x50' with BUEs for strengthening, activity tolerance, and mobility.  Blocked practice sit<>squat with 5 second hold x6 reps focus on forward weight shift, maintaining foot positioning, and forward gaze.  Squat/pivot to/from therapy mat to pt's R side with max assist for lifting/lowering but pt performing more than 75% of transfer.  Sit<>stand x2 from therapy mat focus on use of RUE and LEs to power up and static standing tolerance up to 30 seconds.  Pt significantly anxious with increased respiratory rate and whole body tremors with mobility, PT provided emotional support and encouragement and PA notified.  Pt returned to room at end of session and positioned in recliner with call bell in reach and needs met.   Therapy Documentation Precautions:  Precautions Precautions: Fall Precaution Comments:  (Left hemiparesis, vertigo ) Restrictions Weight Bearing Restrictions: No (feeling just a little tired)   See Function Navigator for Current Functional Status.   Therapy/Group: Individual Therapy  Shila Kruczek E Penven-Crew 03/13/2016, 3:08 PM

## 2016-03-13 NOTE — Progress Notes (Signed)
Social Work Assessment and Plan Social Work Assessment and Plan  Patient Details  Name: Carolan Avedisian MRN: [id number] Date of Birth: [birth year]  Today's Date: 03/13/2016  Problem List:  Patient Active Problem List   Diagnosis Date Noted  . Gait disturbance, post-stroke 03/13/2016  . Cerebellar stroke, acute (HCC) 03/10/2016  . Super obese (HCC)   . Prediabetes   . Ataxia, post-stroke   . Acute cystitis without hematuria   . CVA (cerebral vascular accident) (HCC) 03/06/2016  . Morbid obesity (HCC) 02/29/2016  . OSA (obstructive sleep apnea) 11/24/2014  . Dyspnea 10/02/2014  . Anemia 04/05/2011  . Urinary tract infection, site not specified 04/05/2011  . Diabetes mellitus (HCC) 02/22/2011  . Hyperlipidemia 02/22/2011  . CAD (coronary artery disease) 01/07/2011  . Hypertension 01/07/2011   Past Medical History:  Past Medical History:  Diagnosis Date  . CAD (coronary artery disease)    small NSTEMI 11/12: LHC 01/06/11:  LAD 90-95%, mD1 90%, pD2 50%,  pOM1 50%, RCA occluded,  EF > 65%;  CABG  12/12: L-LAD  . Diabetes mellitus   . Glucose intolerance (impaired glucose tolerance)    Hemoglobin A1c 6.4 in 12/2010  . HTN (hypertension)    Echo 01/06/11: EF 65%  . Hyperlipidemia   . Obesity    Past Surgical History:  Past Surgical History:  Procedure Laterality Date  . ABDOMINAL HYSTERECTOMY    . APPENDECTOMY    . CORONARY ARTERY BYPASS GRAFT  01/12/2011   Procedure: CORONARY ARTERY BYPASS GRAFTING (CABG);  Surgeon: Alleen Borne, MD;  Location: [facility] OR;  Service: Open Heart Surgery;  Laterality: N/A;  coronary artery bypass graft times one using left internal mammary artery . Attempted endoscopic saphenous vein harvest  . LEFT HEART CATHETERIZATION WITH CORONARY ANGIOGRAM N/A 01/06/2011   Procedure: LEFT HEART CATHETERIZATION WITH CORONARY ANGIOGRAM;  Surgeon: Rollene Rotunda, MD;  Location[facility] CATH LAB;  Service: Cardiovascular;  Laterality: N/A;   Social History:  reports  that she has never smoked. She has never used smokeless tobacco. She reports that she drinks alcohol. She reports that she does not use drugs.  Family / Support Systems Marital Status: Separated How Long?: years Patient Roles: Parent, Other (Comment) (employee) Spouse/Significant Other: Reginald-[phone] Children: Lamont-son [phone] Other Supports: Another son in [place]-general surgeon Anticipated Caregiver: Patient husband can come by and check in on her Ability/Limitations of Caregiver: Pt and husband are separated and live apart Caregiver Availability: Other (Comment) (Unsure of plan currently) Family Dynamics: Close with her two son's they come on the weekends and see her. Both have their own families/jobs and live out of town. Pt hopes to be mod/i before going home, she has no plans for husband to stay with her or assist her.  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 She has always been independent  and wants to remain so. 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 Recent Psychosocial Issues: other health issues-were managed until this Pyschiatric  History: No history deferred depression screen due to aboe to verbalize her feelings and concerns. Will see if adjusting well and consult team if neuro-psych needed. Optimistic and positive regarding her recovery from this stroke. Substance Abuse History: No issues  Patient / Family Perceptions, Expectations & Goals Pt/Family understanding of illness & functional limitations: Pt can explain her stroke and deficits, she talks with the MD and feels she has a good understanding of her deficits. Her son is a Careers advisersurgeon and has spoken with the MD also. Pt feels she is in good hands here. Premorbid pt/family roles/activities: Mother, caregiver, employee, church member, grandmother, etc Anticipated changes in roles/activities/participation: resume Pt/family expectations/goals: Pt states: " I want to be able to move around on my own, before I leave here. I will do my best to reach these goals."    [employer] Community Agencies: None Premorbid Home Care/DME Agencies: None Transportation available at discharge: Family/friends, pt was driving prior to admission Resource referrals recommended: Support group (specify)  Discharge Planning Living Arrangements: Alone Support Systems: Spouse/significant other, Children, Friends/neighbors, Church/faith community Type of Residence: Private residence Insurance Resources: Media plannerrivate Insurance (specify) (Blue Medicare) Surveyor, quantityinancial Resources: Employment, Restaurant manager, fast foodocial Security Financial Screen Referred: No Living Expenses: Psychologist, sport and exerciseent Money Management: Patient Does the patient have any problems obtaining your medications?: No Home Management: Patient Patient/Family Preliminary Plans: Return home with intermittent assist from family and friends. She reports her seperated husband will not be living with her and lives around the corner from her, if she needs anything she can call him. Discussed what if she needs assist and she reports she will deal with this when it  comes. Social Work Anticipated Follow Up Needs: HH/OP, Support Group  Clinical Impression Pleasant female who is motivated to improve and will do what she needs to do to make this happen. She has two son's who are supportive but busy and live out of town. Her separated husband lives around the corner from her and can Come by and assist some but she does not want him too.Will need to develop a safe discharge plan for pt due to goals are supervision level. Will continue to discuss the discharge plan and work on one that pt can be comfortable with.  Lucy Chrisupree, Kina Shiffman G 03/13/2016, 9:10 AM

## 2016-03-13 NOTE — Progress Notes (Signed)
Occupational Therapy Session Note  Patient Details  Name: Megan Atkinson MRN: [id number] Date of Birth: 1951-01-31  Today's Date: 03/13/2016 OT Individual Time: 0915-1000 OT Individual Time Calculation (min): 45 min    Short Term Goals: Week 1:  OT Short Term Goal 1 (Week 1): Pt will complete toilet transfer with LRAD and Max A OT Short Term Goal 2 (Week 1): Pt will don socks with use of sock aide and supervision  OT Short Term Goal 3 (Week 1): Pt will complete sit<stand for LB dressing with Max A at sink or with RW OT Short Term Goal 4 (Week 1): Pt will complete bathing with Min A with AE as needed    Skilled Therapeutic Interventions/Progress Updates:    Upon entering the room, pt seated in wheelchair awaiting therapist with no c/o pain this session. Pt agreeable to OT intervention. OT demonstrated finger isolation and coordination exercises and pt returned demonstration with L hand and min cues needed for proper technique. Pt having increased difficulty isolating 3rd-5th digit of L hand.OT demonstrated use of yellow resistive theraputty for strengthening and coordination tasks with pt returning demonstrations with min verbal cues for tasks. Pt also having questions regarding time frame for recovery/therapy process in which OT educated and answered her questions for her to gain greater understanding. Pt transitioned easily into next therapy session as OT exited the room.   Therapy Documentation Precautions:  Precautions Precautions: Fall Precaution Comments:  (Left hemiparesis, vertigo ) Restrictions Weight Bearing Restrictions: No (feeling just a little tired) General:   Vital Signs:  Pain:   ADL: ADL ADL Comments: Please see functional navigator for ADL status Exercises:   Other Treatments:    See Function Navigator for Current Functional Status.   Therapy/Group: Individual Therapy  Alen BleacherBradsher, Veronica Fretz P 03/13/2016, 1:23 PM

## 2016-03-13 NOTE — Progress Notes (Signed)
Physical Therapy Session Note  Patient Details  Name: Stella Bortle MRN: [id number] Date of Birth: 24-Dec-1950  Today's Date: 03/13/2016 PT Individual Time: 0805-0835 PT Individual Time Calculation (min): 30 min   Short Term Goals: Week 1:  PT Short Term Goal 1 (Week 1): Pt will demonstrate sit-to-stand transfers Min A with RW consistently PT Short Term Goal 2 (Week 1): Pt will ambulate 25 ft Mod A with RW  PT Short Term Goal 3 (Week 1): Pt will perform stand pivot transfer Mod A with RW consistently PT Short Term Goal 4 (Week 1): Pt will initiate car transfer training  Skilled Therapeutic Interventions/Progress Updates: Pt presented in bed completing breakfast and agreeable to therapy. Performed supine to EOB L/R with minA for LLE placement. Performed squat pivot transfer with modA and cues for hand placement and sequencing. Transported to rehab gym due to time management and performed sit to/from stand at parallel bars x 1 with modA with PTA blocking L knee to prevent buckling. Pt returned to room and remained in w/c with call bell within reach and all current needs met.      Therapy Documentation Precautions:  Precautions Precautions: Fall Precaution Comments:  (Left hemiparesis, vertigo ) Restrictions Weight Bearing Restrictions: No   See Function Navigator for Current Functional Status.   Therapy/Group: Individual Therapy  Rosilyn Coachman  Gazelle Towe, PTA  03/13/2016, 8:48 AM

## 2016-03-13 NOTE — Progress Notes (Signed)
Therapy reporting high levels of anxiety --almost panic attacks that is limiting mobility. Will start Buspar to help manage anxiety.

## 2016-03-14 ENCOUNTER — Inpatient Hospital Stay (HOSPITAL_COMMUNITY): Payer: Medicare Other | Admitting: Occupational Therapy

## 2016-03-14 ENCOUNTER — Inpatient Hospital Stay (HOSPITAL_COMMUNITY): Payer: Medicare Other | Admitting: Physical Therapy

## 2016-03-14 LAB — URINE CULTURE: Culture: >=100000 — AB

## 2016-03-14 LAB — GLUCOSE, CAPILLARY
GLUCOSE-CAPILLARY: 115 mg/dL — AB (ref 65–99)
GLUCOSE-CAPILLARY: 101 mg/dL — AB (ref 65–99)
GLUCOSE-CAPILLARY: 113 mg/dL — AB (ref 65–99)
Glucose-Capillary: 114 mg/dL — ABNORMAL HIGH (ref 65–99)
Glucose-Capillary: 92 mg/dL (ref 65–99)

## 2016-03-14 MED ORDER — SENNOSIDES-DOCUSATE SODIUM 8.6-50 MG PO TABS
2.0000 | ORAL_TABLET | Freq: Two times a day (BID) | ORAL | Status: DC
  Administered 2016-03-14 – 2016-03-31 (×33): 2 via ORAL
  Filled 2016-03-14 (×36): qty 2

## 2016-03-14 NOTE — Progress Notes (Signed)
Physical Therapy Session Note  Patient Details  Name: Megan Atkinson MRN: [id number] Date of Birth: 25-Oct-1950  Today's Date: 03/14/2016 PT Individual Time: 1100-1200 PT Individual Time Calculation (min): 60 min   Short Term Goals: Week 1:  PT Short Term Goal 1 (Week 1): Pt will demonstrate sit-to-stand transfers Min A with RW consistently PT Short Term Goal 2 (Week 1): Pt will ambulate 25 ft Mod A with RW  PT Short Term Goal 3 (Week 1): Pt will perform stand pivot transfer Mod A with RW consistently PT Short Term Goal 4 (Week 1): Pt will initiate car transfer training  Skilled Therapeutic Interventions/Progress Updates:    no c/o pain, session focus on cardiovascular endurance, transfers, strengthening, NMR, activity tolerance, and w/c mobility.  Pt self limiting throughout session and requires max encouragement to attempt activities and continue to attempt throughout session.  Pt continues to demonstrate anxious behavior (increased respiratory rate, tremors, etc) throughout session, requiring cues for relaxation and deep breathing.   Pt propels w/c x50' with mod multimodal cues and overall supervision with min assist when turning to the R.  Sit<>stand at // bars focus on pushing up from w/c with pt unable on first 3 attempts 2/2 ?anxiety/fear of falling and pushing to L.  With second person present for emotional support pt able to stand with mod assist and max cues for foot placement, forward weight shift, and forward gaze.  Pt able to tolerate static standing with LUE support, intermittent RUE support, and mod assist to maintain midline orientation for approximately 2 minutes while completing simple pipe tree design.  PT instructed pt in LAQ with 5 second hold 16 reps bilaterally (rest after 10 reps) with verbal cues for improved technique.  W/C propulsion with BLEs and intermittent use of all 4 extremities x60' with multiple rest breaks.    Pt returned to room at end of session and positioned  upright in w/c with call bell in reach and needs met.  Therapy Documentation Precautions:  Precautions Precautions: Fall Precaution Comments:  (Left hemiparesis, vertigo ) Restrictions Weight Bearing Restrictions: No   See Function Navigator for Current Functional Status.   Therapy/Group: Individual Therapy  Taesean Reth E Penven-Crew 03/14/2016, 11:45 AM

## 2016-03-14 NOTE — Progress Notes (Signed)
Physical Therapy Session Note  Patient Details  Name: Megan Atkinson MRN: [id number] Date of Birth: [birth year]  Today's Date: 03/14/2016 PT Individual Time: 8185-6314 PT Individual Time Calculation (min): 40 min   Short Term Goals: Week 1:  PT Short Term Goal 1 (Week 1): Pt will demonstrate sit-to-stand transfers Min A with RW consistently PT Short Term Goal 2 (Week 1): Pt will ambulate 25 ft Mod A with RW  PT Short Term Goal 3 (Week 1): Pt will perform stand pivot transfer Mod A with RW consistently PT Short Term Goal 4 (Week 1): Pt will initiate car transfer training  Skilled Therapeutic Interventions/Progress Updates:    no c/o pain.  Session focus on transfers, midline orientation, sit<>stand, strengthening, and standing tolerance.   Pt requesting to toilet at start of session with RN present.  Sit<>stand into stedy with pt pulling up on bar with min assist and transfer into bathroom on stedy.  Pt able to complete 3/3 toileting steps with steady assist for balance in lift.  Pt able to tolerate supported standing x30 minutes in stedy with min cues for midline orientation.  Sit<>stand from stedy throughout session with min assist and cues for forward weight shift.  Pt completed 2 trials of dynavision while standing in stedy x1 min and x2 minutes with rest break in between.  PT provided verbal cues for midline orientation, upright posture, and deep breathing to reduce anxiety during standing.  Pt returned to room at end of session and positioned in recliner with call bell in reach and needs met.   Therapy Documentation Precautions:  Precautions Precautions: Fall Precaution Comments:  (Left hemiparesis, vertigo ) Restrictions Weight Bearing Restrictions: No  See Function Navigator for Current Functional Status.   Therapy/Group: Individual Therapy  Earnest Conroy Penven-Crew 03/14/2016, 3:38 PM

## 2016-03-14 NOTE — Progress Notes (Signed)
Occupational Therapy Session Note  Patient Details  Name: Megan NielsenBeverley Atkinson MRN: [id number] Date of Birth: 1951-01-15  Today's Date: 03/14/2016 OT Individual Time: 0930-1030 and 1434-1500 OT Individual Time Calculation (min): 60 min and 26 min   Short Term Goals: Week 1:  OT Short Term Goal 1 (Week 1): Pt will complete toilet transfer with LRAD and Max A OT Short Term Goal 2 (Week 1): Pt will don socks with use of sock aide and supervision  OT Short Term Goal 3 (Week 1): Pt will complete sit<stand for LB dressing with Max A at sink or with RW OT Short Term Goal 4 (Week 1): Pt will complete bathing with Min A with AE as needed   Skilled Therapeutic Interventions/Progress Updates:    1) Treatment session with focus on ADL retraining with use of AE for LB bathing and dressing, sit > stand, and functional use of LUE.  Pt declined bathing at shower level, engaged in discussion and encouragement with plan to bathe at shower level Thursday.  Completed bathing and dressing at sink with use of long handled sponge and shoe funnel.  [facility] cues for anterior weight shift and to push up from arm rests, pt with increasing anxiety with mobility and unable to come into standing with this technique.  Pt pushed up from sink for therapist to assist with hygiene of perineal area and pull up pants, as pt insistent on maintaining BUE support on sink.  Engaged in discussion regarding safety with sit > stand and not always having something that can support her weight to pull up on, therefore increased need for focus on sit > stand.  Utilized shoe horn and shoe funnel for donning shoes, with pt demonstrating improved success with shoe funnel.  Engaged in Connect 4 activity with focus on functional use of LUE and awareness.  2) Treatment session with focus on BUE strengthening.  Pt declining any standing activity this session, despite therapist encouragement and rationale.  Engaged in BUE  strengthening with use of orange (level 2) theraband with chest presses and diagonals with 3 sets of 10 each.  Therapist provided min cues for correct technique.  Completed fine motor tasks with buttoning buttons and tying shoe laces with focus on LUE fine motor control.    Therapy Documentation Precautions:  Precautions Precautions: Fall Precaution Comments:  (Left hemiparesis, vertigo ) Restrictions Weight Bearing Restrictions: No General:   Vital Signs: Therapy Vitals Pulse Rate: 73 BP: (!) 154/64 Patient Position (if appropriate): Sitting Pain: Pain Assessment Pain Score: 2   See Function Navigator for Current Functional Status.   Therapy/Group: Individual Therapy  Rosalio LoudHOXIE, Branndon Tuite 03/14/2016, 12:16 PM

## 2016-03-14 NOTE — Progress Notes (Signed)
Subjective/Complaints:  Constipated last night   ROS Negative CP, SOB, N/V/D Objective: Vital Signs: Blood pressure (!) 152/60, pulse 78, temperature 98.2 F (36.8 C), temperature source Oral, resp. rate 18, height 5\' 7"  (1.702 m), weight (!) 169.8 kg (374 lb 6.4 oz), SpO2 97 %. No results found. Results for orders placed or performed during the hospital encounter of 03/10/16 (from the past 72 hour(s))  Glucose, capillary     Status: Abnormal   Collection Time: 03/11/16 11:10 AM  Result Value Ref Range   Glucose-Capillary 109 (H) 65 - 99 mg/dL  Glucose, capillary     Status: None   Collection Time: 03/11/16  5:49 PM  Result Value Ref Range   Glucose-Capillary 98 65 - 99 mg/dL  Glucose, capillary     Status: Abnormal   Collection Time: 03/11/16  8:54 PM  Result Value Ref Range   Glucose-Capillary 105 (H) 65 - 99 mg/dL   Comment 1 Notify RN   Glucose, capillary     Status: Abnormal   Collection Time: 03/12/16  6:44 AM  Result Value Ref Range   Glucose-Capillary 116 (H) 65 - 99 mg/dL   Comment 1 Notify RN   Glucose, capillary     Status: Abnormal   Collection Time: 03/12/16 11:33 AM  Result Value Ref Range   Glucose-Capillary 106 (H) 65 - 99 mg/dL  Glucose, capillary     Status: Abnormal   Collection Time: 03/12/16  4:32 PM  Result Value Ref Range   Glucose-Capillary 106 (H) 65 - 99 mg/dL  Glucose, capillary     Status: Abnormal   Collection Time: 03/12/16  9:00 PM  Result Value Ref Range   Glucose-Capillary 126 (H) 65 - 99 mg/dL  Glucose, capillary     Status: Abnormal   Collection Time: 03/13/16  7:05 AM  Result Value Ref Range   Glucose-Capillary 103 (H) 65 - 99 mg/dL  Glucose, capillary     Status: None   Collection Time: 03/13/16 12:08 PM  Result Value Ref Range   Glucose-Capillary 87 65 - 99 mg/dL      General: No acute distress, Morbid obesity Mood and affect are appropriate Heart: Regular rate and rhythm no rubs murmurs or extra sounds Lungs: Clear to  auscultation, breathing unlabored, no rales or wheezes Abdomen: Positive bowel sounds, soft nontender to palpation, nondistended Extremities: No clubbing, cyanosis, or edema Skin: No evidence of breakdown, no evidence of rash Neurologic: , motor strength is 5/5 in bilateral deltoid, bicep, tricep, grip,4 hip flexor, knee extensors, ankle dorsiflexor and plantar flexor  Cerebellar exam normal finger to nose to finger right side, mild/moderate dysmetria on left side Musculoskeletal: no pain with  range of motion in all 4 extremities. No joint swelling  Assessment/Plan: 1. Functional deficits secondary to Left cerebellar infarct which require 3+ hours per day of interdisciplinary therapy in a comprehensive inpatient rehab setting. Physiatrist is providing close team supervision and 24 hour management of active medical problems listed below. Physiatrist and rehab team continue to assess barriers to discharge/monitor patient progress toward functional and medical goals. FIM: Function - Bathing Position: Wheelchair/chair at sink Body parts bathed by patient: Right arm, Left arm, Chest, Abdomen, Right upper leg, Left upper leg Body parts bathed by helper: Front perineal area, Buttocks Bathing not applicable: Right lower leg, Left lower leg Assist Level:  (Mod assist)  Function- Upper Body Dressing/Undressing What is the patient wearing?: Pull over shirt/dress Pull over shirt/dress - Perfomed by patient: Thread/unthread right sleeve, Thread/unthread  left sleeve, Put head through opening, Pull shirt over trunk Assist Level: Set up Set up : To obtain clothing/put away Function - Lower Body Dressing/Undressing What is the patient wearing?: Pants Position: Wheelchair/chair at sink Underwear - Performed by patient: Thread/unthread right underwear leg Underwear - Performed by helper: Thread/unthread left underwear leg, Pull underwear up/down Pants- Performed by patient: Thread/unthread right pants  leg, Thread/unthread left pants leg Pants- Performed by helper: Pull pants up/down Non-skid slipper socks- Performed by patient: Don/doff right sock, Don/doff left sock (with sock aide) Non-skid slipper socks- Performed by helper: Don/doff right sock, Don/doff left sock Assist for footwear: Supervision/touching assist Assist for lower body dressing:  (Mod assist)  Function - Toileting Toileting steps completed by patient: Adjust clothing prior to toileting Toileting steps completed by helper: Adjust clothing prior to toileting, Performs perineal hygiene, Adjust clothing after toileting Toileting Assistive Devices: Grab bar or rail, Toilet aid Assist level: Two helpers  Function - Archivist transfer assistive device: Systems developer lift: Stedy Assist level to toilet: 2 helpers Assist level from toilet: 2 helpers  Function - Chair/bed transfer Chair/bed transfer method: Squat pivot Chair/bed transfer assist level: Maximal assist (Pt 25 - 49%/lift and lower) Chair/bed transfer assistive device: Mechanical lift Mechanical lift: Stedy Chair/bed transfer details: Manual facilitation for placement, Manual facilitation for weight shifting, Verbal cues for technique, Verbal cues for sequencing, Verbal cues for precautions/safety, Verbal cues for safe use of DME/AE  Function - Locomotion: Wheelchair Will patient use wheelchair at discharge?: Yes Type: Manual Max wheelchair distance: 50 Assist Level: Touching or steadying assistance (Pt > 75%) Assist Level: Touching or steadying assistance (Pt > 75%) Wheel 150 feet activity did not occur: Safety/medical concerns Function - Locomotion: Ambulation Assistive device: Walker-rolling Max distance: 12 ft Assist level: 2 helpers Assist level: 2 helpers Walk 50 feet with 2 turns activity did not occur: Safety/medical concerns Walk 150 feet activity did not occur: Safety/medical concerns Walk 10 feet on uneven surfaces  activity did not occur: Safety/medical concerns  Function - Comprehension Comprehension: Auditory Comprehension assist level: Follows complex conversation/direction with no assist  Function - Expression Expression: Verbal Expression assist level: Expresses complex ideas: With no assist  Function - Social Interaction Social Interaction assist level: Interacts appropriately with others - No medications needed.  Function - Problem Solving Problem solving assist level: Solves complex problems: Recognizes & self-corrects  Function - Memory Memory assist level: Complete Independence: No helper Patient normally able to recall (first 3 days only): Current season, Location of own room, Staff names and faces, That he or she is in a hospital  Medical Problem List and Plan: 1. Ataxia and functional deficitssecondary to left cerebellar infarct  -CIR level PT, OT, team conf in am 2. DVT Prophylaxis/Anticoagulation: Pharmaceutical: Lovenox 3. Pain Management: N/A 4. Mood: Team to provide ego support. LCSW to follow for evaluation and support.  5. Neuropsych: This patient iscapable of making decisions on herown behalf. 6. Skin/Wound Care: Routine pressure relief measures.  7. Fluids/Electrolytes/Nutrition: Monitor I/O. Lytes nl  8. HTN: Monitor BP bid. Permissive HTN for 5-7 days.--resume Lasix/metoprolol as indicated.  Vitals:   03/13/16 1452 03/14/16 0425  BP: (!) 146/68 (!) 152/60  Pulse: 82 78  Resp: 18 18  Temp: 97.6 F (36.4 C) 98.2 F (36.8 C)   9. OSA: Continue to encourage consistent use of CPAP 10. Morbid obesity: BMI 55.87. Will consult dietician to educate on CM/Heart healthy diet. Encourage appropriate diet and exercise to help promote weight  loss to help promote health,  11. Pyuria: Ecoli UTI on Bactrim, no evidence of Side effect Tx 5-7d 12. Prediabetes: Hgb A1C-6.3. CM diet reinforced. Monitor BS ac/hs for trends.  CBG (last 3)   Recent Labs   03/12/16 2100 03/13/16 0705 03/13/16 1208  GLUCAP 126* 103* 87    13. CAD s/p CABG: On ASA and Lipitor   14.  Constipation relieved by supp, no results with  miralax qd, change to senna S LOS (Days) 4 A FACE TO FACE EVALUATION WAS PERFORMED  Megan Atkinson E 03/14/2016, 7:32 AM

## 2016-03-15 ENCOUNTER — Inpatient Hospital Stay (HOSPITAL_COMMUNITY): Payer: Medicare Other | Admitting: Occupational Therapy

## 2016-03-15 ENCOUNTER — Inpatient Hospital Stay (HOSPITAL_COMMUNITY): Payer: Medicare Other | Admitting: Physical Therapy

## 2016-03-15 ENCOUNTER — Inpatient Hospital Stay (HOSPITAL_COMMUNITY): Payer: Medicare Other | Attending: Psychology | Admitting: Psychology

## 2016-03-15 DIAGNOSIS — F411 Generalized anxiety disorder: Secondary | ICD-10-CM

## 2016-03-15 DIAGNOSIS — I639 Cerebral infarction, unspecified: Secondary | ICD-10-CM

## 2016-03-15 DIAGNOSIS — I638 Other cerebral infarction: Secondary | ICD-10-CM

## 2016-03-15 LAB — GLUCOSE, CAPILLARY
GLUCOSE-CAPILLARY: 117 mg/dL — AB (ref 65–99)
GLUCOSE-CAPILLARY: 120 mg/dL — AB (ref 65–99)
Glucose-Capillary: 99 mg/dL (ref 65–99)
Glucose-Capillary: 96 mg/dL (ref 65–99)

## 2016-03-15 MED ORDER — CITALOPRAM HYDROBROMIDE 10 MG PO TABS
10.0000 mg | ORAL_TABLET | Freq: Every day | ORAL | Status: DC
  Administered 2016-03-15 – 2016-03-16 (×2): 10 mg via ORAL
  Filled 2016-03-15 (×2): qty 1

## 2016-03-15 MED ORDER — BUSPIRONE HCL 5 MG PO TABS
7.5000 mg | ORAL_TABLET | Freq: Two times a day (BID) | ORAL | Status: DC
  Administered 2016-03-15 – 2016-03-16 (×3): 7.5 mg via ORAL
  Filled 2016-03-15 (×3): qty 2

## 2016-03-15 NOTE — Progress Notes (Signed)
Placed on cpap at this time.

## 2016-03-15 NOTE — Progress Notes (Signed)
Social Work Patient ID: Megan Atkinson, female   DOB: [birth year], 66 y.o.   MRN: [id number]  Met with pt to discuss team conference goals supervision level and target discharge date 2/20. Discussed with pt her need to cope With anxiety and fear and push herself in therapies. She does not want her husband to assist her especially with ADL's, so she will need to push herself and get out of her comfort zone. She acknowledged this and will Work with team on the goals she needs to achieve to be able to return home alone with her friends coming in and out. Neuro-psych will also be seeing to work on these issues with her. Will wait till next week to see if her goals are reachable Unsure if her insurance will cover for her to go to a NH since she came here to rehab. May not be an option for pt and she is aware of this.

## 2016-03-15 NOTE — Progress Notes (Signed)
Occupational Therapy Session Note  Patient Details  Name: Megan Atkinson MRN: [id number] Date of Birth: 1950/04/15  Today's Date: 03/15/2016 OT Individual Time: [phone] and 1400-1500 OT Individual Time Calculation (min): 72 min and 60 min   Short Term Goals: Week 1:  OT Short Term Goal 1 (Week 1): Pt will complete toilet transfer with LRAD and Max A OT Short Term Goal 2 (Week 1): Pt will don socks with use of sock aide and supervision  OT Short Term Goal 3 (Week 1): Pt will complete sit<stand for LB dressing with Max A at sink or with RW OT Short Term Goal 4 (Week 1): Pt will complete bathing with Min A with AE as needed   Skilled Therapeutic Interventions/Progress Updates:    1) Treatment session with focus on sit > stand, standing balance, and increased participation in self-care tasks.  Pt received in bed reporting restless night, but willing to participate in treatment session.  Completed bed mobility with supervision and use of bed rails.  Attempted sit > stand from EOB with RW.  Pt attempting to pull up on RW, educated on safety concerns and fall risk.  Pt pulling so hard on w/c hand rail and bed rail, she was unable to come up into standing.  Increased anxiety with pt stating repeatedly that she just couldn't do it.  Attempted to redirect and focus on weight shift, with pt shutting down.  Completed squat pivot transfer bed > w/c with mod assist with pt able to complete trunk rotation, 2nd person to maintain positioning of w/c.  Engaged in bathing and dressing at sit > stand level at sink.  Again attempted to utilize RW to increase upright standing when pulling up pants, however pt insistent on pulling up from sink.  Re-educated on increased fall risk with pulling up on surfaces.  Pt able to maintain standing while alternating UE support on sink and successfully pulled up pants with min-mod assist for emotional support and physical assist.  Engaged in discussion regarding d/c planning, goals,  and increased focus on sit > stands, transfers, and ability to complete tasks in standing.    2) Treatment session with focus on trunk control, weight shifting, sit > stand, and functional mobility.  Utilized Stedy for sit > stand from recliner.  In therapy gym, engaged in sit > stand with use of mirror for visual feedback.  Pt demonstrating increased Lt lean without UE support and increased anxiety.  Utilized therapeutic co-treat with OT clinical specialist to provide pt increased emotional and physical support.  Engaged in sit > stand with tall stationary stool for pt to weight bear through RUE to provide UE support while encouraging weight shift to Rt.  Min assist sit > stand with tactile cues at Lt knee for stability and emotional support.  Challenged upright standing balance and weight shifting with pt reaching across midline with LUE to grasp items in vertical plane while utilizing mirror for additional feedback.  Mod cues for slow rate of transitional movements to increase safety and decreased anxiety.  Ambulated with bariatric RW 4-5 feet x2 with +2 assistance for safety and multimodal cues with focus on anterior weight shift with sit > stand and lateral weight shifting for stepping pattern.    Pt benefits from 2 person assist for moral support/encouragement due to high anxiety levels and body habitus.  Therapy Documentation Precautions:  Precautions Precautions: Fall Precaution Comments:  (Left hemiparesis, vertigo ) Restrictions Weight Bearing Restrictions: No Pain:  Pt with no  c/o pain  See Function Navigator for Current Functional Status.   Therapy/Group: Individual Therapy  Rosalio LoudHOXIE, Jeramiah Mccaughey 03/15/2016, 10:26 AM

## 2016-03-15 NOTE — Patient Care Conference (Signed)
Inpatient RehabilitationTeam Conference and Plan of Care Update Date: 03/15/2016   Time: 11:20 AM    Patient Name: Megan Atkinson      Medical Record Number: [id number]  Date of Birth: January 14, 1951 Sex: Female         Room/Bed: 4W19C/4W19C-01 Payor Info: Payor: [employer]UE [employer]UE [employer] MEDICARE / Plan: BCBS MEDICARE / Product Type: *No Product type* /    Admitting Diagnosis: CVA  Admit Date/Time:  03/10/2016  2:54 PM Admission Comments: No comment available   Primary Diagnosis:  Ataxia, post-stroke Principal Problem: Ataxia, post-stroke  Patient Active Problem List   Diagnosis Date Noted  . Gait disturbance, post-stroke 03/13/2016  . Cerebellar stroke, acute (HCC) 03/10/2016  . Super obese (HCC)   . Prediabetes   . Ataxia, post-stroke   . Acute cystitis without hematuria   . CVA (cerebral vascular accident) (HCC) 03/06/2016  . Morbid obesity (HCC) 02/29/2016  . OSA (obstructive sleep apnea) 11/24/2014  . Dyspnea 10/02/2014  . Anemia 04/05/2011  . Urinary tract infection, site not specified 04/05/2011  . Diabetes mellitus (HCC) 02/22/2011  . Hyperlipidemia 02/22/2011  . CAD (coronary artery disease) 01/07/2011  . Hypertension 01/07/2011    Expected Discharge Date: Expected Discharge Date: 03/28/16  Team Members Present: Physician leading conference: Dr. Claudette Laws Social Worker Present: Dossie Der, LCSW Nurse Present: Kennyth Arnold, RN PT Present: Teodoro Kil, PT OT Present: Rosalio Loud, OT SLP Present: Feliberto Gottron, SLP PPS Coordinator present : Tora Duck, RN, CRRN     Current Status/Progress Goal Weekly Team Focus  Medical   fatigues easily, anxious about falls, grabs at  objects  Treat anxiety  Anxiety meds, Neuropsych eval   Bowel/Bladder   Continent of B/B. LBM 03/12/16. sennakot scheduled with ducolax and fleets PRN.   maintain regular BMs.  Monitor i/o q shift. Provide food high in fiber and encourage ambulation.   Swallow/Nutrition/  Hydration             ADL's   Mod assist sit > stand with BUE support - max-total without UE support, min assist otherwise  Supervision overall  ADL retraining, BUE/BLE strengthening, sit > stand, dynamic standing balance   Mobility   mod>max overall, very anxious which limits mobility  supervision>mod I  activity tolerance, relaxation, transfers, gait, w/c mobility, d/c planning    Communication             Safety/Cognition/ Behavioral Observations            Pain   Denies any pain or discomfort.  Maintain comfortable pain level of less than 3-10.  Assess pain q shift and prn.   Skin   No skin issue.  Be free of skin breakdown.  Assess skin integrity q shift and prn.      *See Care Plan and progress notes for long and short-term goals.  Barriers to Discharge: see above    Possible Resolutions to Barriers:  Neuropsych eval , see above    Discharge Planning/Teaching Needs:  Home alone with family providing intermittent assist-pt needs to be if can reach mod/i to be safe home alone.      Team Discussion:  Goals supervision level, pt currently doesn't have this level of care. Pt struggles with fear of falling and anxiety which limit her participation in therapies. Pt aware needs to push herself and neuro-psych to see to discuss with her. Team spreading out therapies due to tiring so now 15/7 until can tolerate more. Pt will push herself. Wants to  not need the assist of her husband at discharge.  Revisions to Treatment Plan:  15/7 and DC 2/20   Continued Need for Acute Rehabilitation Level of Care: The patient requires daily medical management by a physician with specialized training in physical medicine and rehabilitation for the following conditions: Daily direction of a multidisciplinary physical rehabilitation program to ensure safe treatment while eliciting the highest outcome that is of practical value to the patient.: Yes Daily medical management of patient stability for  increased activity during participation in an intensive rehabilitation regime.: Yes Daily analysis of laboratory values and/or radiology reports with any subsequent need for medication adjustment of medical intervention for : Neurological problems;Other  Robin Petrakis, Lemar LivingsRebecca G 03/15/2016, 1:07 PM

## 2016-03-15 NOTE — Progress Notes (Signed)
Neuropsychological Consultation  Patient:   Megan Atkinson   DOB:   [birth year]  MR Number:  [id number]  [facility]:  [facility] [address] [id number] [place] [address] Dept: [phone] Loc: [phone]           Date of Service:   03/15/2016  Start Time:   3 PM End Time:   4 PM  Provider/Observer:  Hershal Coria PSYD       Billing Code/Service: [phone]  Chief Complaint:     Chief Complaint  Patient presents with  . Anxiety  . Stress    Reason for Service:  The patient was Referred for a neuropsychological/psychological consultation. There concerns about anxiety particularly around issues of doing her physical therapy to work on her walking and gait. The patient was observed getting very jittery and having some trembling when she was doing her physical therapy. The request to address issues of possible anxiety and stress particularly pertaining to whether or not it was having a negative impact on her ability to benefit maximally from physical therapy.  Current Status:  The patient is status post stroke that affected the right hemisphere and cerebellar regions. The patient describes initial symptoms of changes in her left side of her face and left arm that led to an ambulance being called. The patient was identified as having a stroke but the patient and her son both decided against TPA. The patient has been followed up for ongoing inpatient therapeutic care here.  Reliability of Information: Information is provided by the patient as well as review of available medical records.  Behavioral Observation: Mieke Brinley  presents as a 66 y.o.-year-old Right African American Female who appeared her stated age. her dress was Appropriate and she was Well Groomed and her manners were Appropriate to the situation.  her participation was indicative of Appropriate behaviors.  There were  physical  disabilities noted.  she displayed an appropriate level of cooperation and motivation.     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  Past Employment:  The patient worked as a Research scientist (life sciences)  Substance Use:  No concerns of substance abuse are reported.  No indication of any history of substance abuse.  Education:   Control and instrumentation engineer History:   Past Medical History:  Diagnosis Date  . CAD (coronary artery disease)    small NSTEMI 11/12: LHC 01/06/11:  LAD 90-95%, mD1 90%, pD2 50%,  pOM1 50%, RCA occluded,  EF > 65%;  CABG  12/12: L-LAD  . Diabetes mellitus   . Glucose intolerance (impaired glucose tolerance)    Hemoglobin A1c 6.4 in 12/2010  . HTN (hypertension)    Echo  01/06/11: EF 65%  . Hyperlipidemia   . Obesity         Facility-Administered Encounter Medications as of 03/15/2016  Medication  . acetaminophen (TYLENOL) tablet 325-650 mg  . alum & mag hydroxide-simeth (MAALOX/MYLANTA) 200-200-20 MG/5ML suspension 30 mL  . aspirin EC tablet 325 mg  . atorvastatin (LIPITOR) tablet 80 mg  . bisacodyl (DULCOLAX) suppository 10 mg  . clopidogrel (PLAVIX) tablet 75 mg  . diphenhydrAMINE (BENADRYL) 12.5 MG/5ML elixir 12.5-25 mg  . enoxaparin (LOVENOX) injection 80 mg  .  fluticasone (FLONASE) 50 MCG/ACT nasal spray 1 spray  . guaiFENesin-dextromethorphan (ROBITUSSIN DM) 100-10 MG/5ML syrup 5-10 mL  . insulin aspart (novoLOG) injection 0-9 Units  . prochlorperazine (COMPAZINE) tablet 5-10 mg   Or  . prochlorperazine (COMPAZINE) injection 5-10 mg   Or  . prochlorperazine (COMPAZINE) suppository 12.5 mg  . senna-docusate (Senokot-S) tablet 2 tablet  . sodium phosphate (FLEET) 7-19 GM/118ML enema 1 enema  . sulfamethoxazole-trimethoprim (BACTRIM DS,SEPTRA DS) 800-160 MG per tablet 1 tablet  . traZODone (DESYREL) tablet 25-50 mg  . [DISCONTINUED] busPIRone (BUSPAR) tablet 5 mg   Outpatient Encounter Prescriptions as of 03/15/2016  Medication Sig  . aspirin 325 MG tablet Take 325 mg by mouth daily.    . Calcium Carbonate-Vitamin D (CALCIUM-VITAMIN D) 500-200 MG-UNIT tablet Take 1 tablet by mouth daily.  . clopidogrel (PLAVIX) 75 MG tablet Take 1 tablet (75 mg total) by mouth daily.  . Cyanocobalamin (VITAMIN B-12 IJ) weekly  . Ginkgo 60 MG TABS Take 1 tablet by mouth daily. For memory   . glucosamine-chondroitin 500-400 MG tablet Take 1 tablet by mouth daily.    Chilton Si. Green Tea, Camillia sinensis, 1000 MG TABS Take 1 tablet by mouth daily.   . metoprolol (LOPRESSOR) 50 MG tablet TAKE ONE TABLET BY MOUTH TWICE DAILY  . pravastatin (PRAVACHOL) 80 MG tablet TAKE ONE TABLET BY MOUTH ONCE DAILY          Sexual History:   History  Sexual Activity  . Sexual activity: Not on file    Abuse/Trauma History: The patient denied any specific history of abuse or trauma. However, she did acknowledge that everything that has happened recently since her stroke has been stressful for her and traumatic to try to cope and deal with.  Psychiatric History:  Patient denied any prior psychiatric history.  Family Med/Psych History:  Family History  Problem Relation Age of Onset  . Coronary artery disease      No family history    Risk of Suicide/Violence: virtually  non-existent    Impression/DX:  The patient does acknowledge some anxeity and apprehension about fear of falling.  However, she reports that as she works more and more her comfort level is getting better.  Do not think underlying anxiety disorder.     Disposition/Plan:  Worked on coping skills around anxiety and worry about falling.  Diagnosis:    Anxiety state  Cerebrovascular accident (CVA) due to other mechanism [facility](HCC)  Cerebellar stroke (HCC)         Electronically Signed   _______________________ Arley PhenixJohn Rodenbough, Psy.D.

## 2016-03-15 NOTE — Progress Notes (Signed)
Subjective/Complaints: Bowels did not respond to Senokot S, no abdominal pain. No nausea Discussed with nursing   ROS Negative CP, SOB, N/V/D Objective: Vital Signs: Blood pressure 140/83, pulse 79, temperature 98.2 F (36.8 C), temperature source Oral, resp. rate 18, height _0  (1.702 m), weight (!) 169.8 kg (374 lb 6.4 oz), SpO2 97 %. No results found. Results for orders placed or performed during the hospital encounter of 03/10/16 (from the past 72 hour(s))  Glucose, capillary     Status: Abnormal   Collection Time: 03/12/16 11:33 AM  Result Value Ref Range   Glucose-Capillary 106 (H) 65 - 99 mg/dL  Glucose, capillary     Status: Abnormal   Collection Time: 03/12/16  4:32 PM  Result Value Ref Range   Glucose-Capillary 106 (H) 65 - 99 mg/dL  Glucose, capillary     Status: Abnormal   Collection Time: 03/12/16  9:00 PM  Result Value Ref Range   Glucose-Capillary 126 (H) 65 - 99 mg/dL  Glucose, capillary     Status: Abnormal   Collection Time: 03/13/16  7:05 AM  Result Value Ref Range   Glucose-Capillary 103 (H) 65 - 99 mg/dL  Glucose, capillary     Status: None   Collection Time: 03/13/16 12:08 PM  Result Value Ref Range   Glucose-Capillary 87 65 - 99 mg/dL  Glucose, capillary     Status: Abnormal   Collection Time: 03/13/16  4:51 PM  Result Value Ref Range   Glucose-Capillary 114 (H) 65 - 99 mg/dL  Glucose, capillary     Status: Abnormal   Collection Time: 03/13/16  8:51 PM  Result Value Ref Range   Glucose-Capillary 115 (H) 65 - 99 mg/dL  Glucose, capillary     Status: Abnormal   Collection Time: 03/14/16  7:02 AM  Result Value Ref Range   Glucose-Capillary 101 (H) 65 - 99 mg/dL  Glucose, capillary     Status: Abnormal   Collection Time: 03/14/16 12:32 PM  Result Value Ref Range   Glucose-Capillary 113 (H) 65 - 99 mg/dL  Glucose, capillary     Status: None   Collection Time: 03/14/16  5:15 PM  Result Value Ref Range   Glucose-Capillary 92 65 - 99 mg/dL   Glucose, capillary     Status: Abnormal   Collection Time: 03/14/16  9:15 PM  Result Value Ref Range   Glucose-Capillary 117 (H) 65 - 99 mg/dL   Comment 1 Notify RN   Glucose, capillary     Status: None   Collection Time: 03/15/16  7:19 AM  Result Value Ref Range   Glucose-Capillary 99 65 - 99 mg/dL      General: No acute distress, Morbid obesity Mood and affect are appropriate Heart: Regular rate and rhythm no rubs murmurs or extra sounds Lungs: Clear to auscultation, breathing unlabored, no rales or wheezes Abdomen: Positive bowel sounds, soft nontender to palpation, nondistended Extremities: No clubbing, cyanosis, or edema Skin: No evidence of breakdown, no evidence of rash Neurologic: , motor strength is 5/5 in bilateral deltoid, bicep, tricep, grip,4 hip flexor, knee extensors, ankle dorsiflexor and plantar flexor  Cerebellar exam normal finger to nose to finger right side, mild/moderate dysmetria on left side Musculoskeletal: no pain with  range of motion in all 4 extremities. No joint swelling  Assessment/Plan: 1. Functional deficits secondary to Left cerebellar infarct which require 3+ hours per day of interdisciplinary therapy in a comprehensive inpatient rehab setting. Physiatrist is providing close team supervision and 24 hour management  of active medical problems listed below. Physiatrist and rehab team continue to assess barriers to discharge/monitor patient progress toward functional and medical goals. FIM: Function - Bathing Position: Wheelchair/chair at sink Body parts bathed by patient: Right arm, Left arm, Chest, Abdomen, Right upper leg, Left upper leg, Right lower leg, Left lower leg, Back Body parts bathed by helper: Front perineal area, Buttocks Bathing not applicable: Right lower leg, Left lower leg Assist Level:  (Min assist, use of long handled sponge)  Function- Upper Body Dressing/Undressing What is the patient wearing?: Pull over shirt/dress Pull  over shirt/dress - Perfomed by patient: Thread/unthread right sleeve, Thread/unthread left sleeve, Put head through opening, Pull shirt over trunk Assist Level: Set up Set up : To obtain clothing/put away Function - Lower Body Dressing/Undressing What is the patient wearing?: Pants, Non-skid slipper socks, Shoes Position: Wheelchair/chair at sink Underwear - Performed by patient: Thread/unthread right underwear leg Underwear - Performed by helper: Thread/unthread left underwear leg, Pull underwear up/down Pants- Performed by patient: Thread/unthread right pants leg, Thread/unthread left pants leg Pants- Performed by helper: Pull pants up/down Non-skid slipper socks- Performed by patient: Don/doff right sock, Don/doff left sock Non-skid slipper socks- Performed by helper: Don/doff right sock, Don/doff left sock Shoes - Performed by patient: Don/doff right shoe Shoes - Performed by helper: Don/doff left shoe Assist for footwear: Supervision/touching assist Assist for lower body dressing:  (Mod assist) Assistive Device Comment: with use of shoe funnel  Function - Toileting Toileting steps completed by patient: Adjust clothing prior to toileting, Adjust clothing after toileting, Performs perineal hygiene Toileting steps completed by helper: Adjust clothing prior to toileting, Performs perineal hygiene, Adjust clothing after toileting Toileting Assistive Devices: Grab bar or rail Assist level: Two helpers  Function - Air cabin crew transfer assistive device: Facilities manager lift: Stedy Assist level to toilet: 2 helpers Assist level from toilet: 2 helpers  Function - Chair/bed transfer Chair/bed transfer method: Other Chair/bed transfer assist level: dependent (Pt equals 0%) Chair/bed transfer assistive device: Mechanical lift Mechanical lift: Stedy Chair/bed transfer details: Manual facilitation for placement, Manual facilitation for weight shifting, Verbal cues for  technique, Verbal cues for sequencing, Verbal cues for precautions/safety, Verbal cues for safe use of DME/AE  Function - Locomotion: Wheelchair Will patient use wheelchair at discharge?: Yes Type: Manual Max wheelchair distance: 60 Assist Level: Supervision or verbal cues Assist Level: Supervision or verbal cues Wheel 150 feet activity did not occur: Safety/medical concerns Function - Locomotion: Ambulation Assistive device: Walker-rolling Max distance: 12 ft Assist level: 2 helpers Assist level: 2 helpers Walk 50 feet with 2 turns activity did not occur: Safety/medical concerns Walk 150 feet activity did not occur: Safety/medical concerns Walk 10 feet on uneven surfaces activity did not occur: Safety/medical concerns  Function - Comprehension Comprehension: Auditory Comprehension assist level: Follows complex conversation/direction with no assist  Function - Expression Expression: Verbal Expression assist level: Expresses complex ideas: With no assist  Function - Social Interaction Social Interaction assist level: Interacts appropriately with others - No medications needed.  Function - Problem Solving Problem solving assist level: Solves complex problems: Recognizes & self-corrects  Function - Memory Memory assist level: Complete Independence: No helper Patient normally able to recall (first 3 days only): Current season, Location of own room, Staff names and faces, That he or she is in a hospital  Medical Problem List and Plan: 1. Ataxia and functional deficitssecondary to left cerebellar infarct  -CIR level PT, OT,Team conference today please see physician documentation under  team conference tab, met with team face-to-face to discuss problems,progress, and goals. Formulized individual treatment plan based on medical history, underlying problem and comorbidities. 2. DVT Prophylaxis/Anticoagulation: Pharmaceutical: Lovenox 3. Pain Management: N/A 4. Mood: Team  to provide ego support. LCSW to follow for evaluation and support.  5. Neuropsych: This patient iscapable of making decisions on herown behalf. 6. Skin/Wound Care: Routine pressure relief measures.  7. Fluids/Electrolytes/Nutrition: Monitor I/O. Lytes nl  8. HTN: Monitor BP bid. Permissive HTN for 5-7 days.--resume Lasix/metoprolol as indicated.  Vitals:   03/14/16 1449 03/15/16 0525  BP: (!) 158/76 140/83  Pulse: 79 79  Resp: 17 18  Temp: 98.4 F (36.9 C) 98.2 F (36.8 C)   9. OSA: Continue to encourage consistent use of CPAP 10. Morbid obesity: BMI 55.87. Will consult dietician to educate on CM/Heart healthy diet. Encourage appropriate diet and exercise to help promote weight loss to help promote health,  11. Pyuria: Ecoli UTI on Bactrim, no evidence of Side effectMay discontinue tomorrow 12. Prediabetes: Hgb A1C-6.3. CM diet reinforced. Monitor BS ac/hs for trends.  CBG (last 3)   Recent Labs  03/14/16 1715 03/14/16 2115 03/15/16 0719  GLUCAP 92 117* 99    13. CAD s/p CABG: On ASA and Lipitor   14.  Constipation relieved by supp, no results with  miralax qd, change to senna S, Still no results. Repeat Dulcolax today LOS (Days) 5 A FACE TO FACE EVALUATION WAS PERFORMED  Megan Atkinson E 03/15/2016, 9:27 AM

## 2016-03-15 NOTE — Progress Notes (Signed)
Physical Therapy Session Note  Patient Details  Name: Megan NielsenBeverley Atkinson MRN: [id number] Date of Birth: Jul 12, 1950  Today's Date: 03/15/2016 PT Individual Time: 1000-1100 PT Individual Time Calculation (min): 60 min   Short Term Goals: Week 1:  PT Short Term Goal 1 (Week 1): Pt will demonstrate sit-to-stand transfers Min A with RW consistently PT Short Term Goal 2 (Week 1): Pt will ambulate 25 ft Mod A with RW  PT Short Term Goal 3 (Week 1): Pt will perform stand pivot transfer Mod A with RW consistently PT Short Term Goal 4 (Week 1): Pt will initiate car transfer training  Skilled Therapeutic Interventions/Progress Updates:    no c/o pain, session focus on activity tolerance, w/c mobility, and transfers.   Pt propelled w/c x100' towards therapy gym using a combination of RLE/RUE/LUE with more than a reasonable time and multiple rest breaks.  Sit<>stand from w/c x2 with max assist, focus on pushing up from armrests; pt requires max cues to push up from w/c and rationale as pt always states "I can't, I have to pull up, I just have to!" Sit<>stand from elevated stedy seat with supervision and static standing/supported standing up to 15 minutes during puzzle task.  Pt requires max cues to utilize picture to recreate bug puzzle.  Pt propelled w/c x30' back towards room before requesting to be propelled back for urgency to use restroom.  Squat/pivot to toilet with grab bar and mod assist.  Pt left in bathroom with call cord in reach.   Therapy Documentation Precautions:  Precautions Precautions: Fall Precaution Comments:  (Left hemiparesis, vertigo ) Restrictions Weight Bearing Restrictions: No   See Function Navigator for Current Functional Status.   Therapy/Group: Individual Therapy  [name] 03/15/2016, 11:08 AM

## 2016-03-15 NOTE — Plan of Care (Signed)
Problem: RH BOWEL ELIMINATION Goal: RH STG MANAGE BOWEL W/MEDICATION W/ASSISTANCE STG Manage Bowel with Medication with    min Assistance.   Outcome: Progressing Needs suppository to help with Bms.

## 2016-03-16 ENCOUNTER — Inpatient Hospital Stay (HOSPITAL_COMMUNITY): Payer: Medicare Other | Admitting: Occupational Therapy

## 2016-03-16 ENCOUNTER — Inpatient Hospital Stay (HOSPITAL_COMMUNITY): Payer: Medicare Other | Admitting: Physical Therapy

## 2016-03-16 ENCOUNTER — Encounter (HOSPITAL_COMMUNITY): Payer: Self-pay | Admitting: Neurology

## 2016-03-16 ENCOUNTER — Ambulatory Visit (HOSPITAL_COMMUNITY): Payer: Medicare Other | Admitting: Physical Therapy

## 2016-03-16 ENCOUNTER — Inpatient Hospital Stay (HOSPITAL_COMMUNITY): Payer: Medicare Other | Admitting: *Deleted

## 2016-03-16 LAB — GLUCOSE, CAPILLARY
Glucose-Capillary: 121 mg/dL — ABNORMAL HIGH (ref 65–99)
Glucose-Capillary: 108 mg/dL — ABNORMAL HIGH (ref 65–99)

## 2016-03-16 MED ORDER — FUROSEMIDE 20 MG PO TABS
10.0000 mg | ORAL_TABLET | Freq: Every day | ORAL | Status: DC
  Administered 2016-03-16 – 2016-03-29 (×14): 10 mg via ORAL
  Administered 2016-03-30: 5 mg via ORAL
  Administered 2016-03-31: 10 mg via ORAL
  Filled 2016-03-16 (×16): qty 1

## 2016-03-16 NOTE — Progress Notes (Signed)
Occupational Therapy Session Note  Patient Details  Name: Megan NielsenBeverley Atkinson MRN: [id number] Date of Birth: 06-18-1950  Today's Date: 03/16/2016 OT Individual Time: [id number] and 1350-1435 OT Individual Time Calculation (min): 72 min and 45 min    Short Term Goals: Week 1:  OT Short Term Goal 1 (Week 1): Pt will complete toilet transfer with LRAD and Max A OT Short Term Goal 2 (Week 1): Pt will don socks with use of sock aide and supervision  OT Short Term Goal 3 (Week 1): Pt will complete sit<stand for LB dressing with Max A at sink or with RW OT Short Term Goal 4 (Week 1): Pt will complete bathing with Min A with AE as needed   Skilled Therapeutic Interventions/Progress Updates:    1) Treatment session with focus on transfers, sit > stand, standing balance without UE support, and increased independence with LB dressing.  Pt received in bed reporting need to toilet.  Completed bed > toilet transfer with Stedy due to urgency.  Pt able to complete sit > [place] in [place] with min assist to supervision with cues to continue to push up instead of pulling up. Required assistance with hygiene post toileting due to decreased standing tolerance and reach.  Transferred to shower with use of Stedy, pt seated on BSC to increase success with washing between legs.  Utilized long handled sponge to wash back and lower legs.  Dressing completed at sit > stand level with RW with cues for hand placement and weight shifting.  Pt requiring increased assistance due to increase in anxiety with sit > stand this session.  Pt able to pull pants over Rt hip but unable to release LUE from RW to pull up Lt side.  Pt with increased lean to Lt with RUE removed from RW.  Issued elastic shoe laces to increase success with shoes without needing to fasten them.  Utilized shoe funnel to don shoes.  2) Treatment session with focus on sit > stand and standing balance without UE support.  Completed stand step transfer w/c to therapy mat with  RW with mod-max assist and multimodal cues for anterior weight shift and increased weight shift to Rt with sit > stand and when stepping.  Engaged in standing activity with LUE support on RW while reaching to Rt to encourage weight shifting to Rt, however due to LUE placed on RW pt continues to push to Lt despite reaching to Rt.  Modified setup to allow pt to weight bear through tall stationary stool with RUE while reaching forward and to Rt with LUE to decrease pushing and allow for weight shift to Lt.  Pt progressed from mod assist to min guard with RUE support.  Engaged in discussion of functional carryover to LB dressing and hygiene post toileting.  Therapy Documentation Precautions:  Precautions Precautions: Fall Precaution Comments:  (Left hemiparesis, vertigo ) Restrictions Weight Bearing Restrictions: No General:   Vital Signs: Therapy Vitals Temp: 98.4 F (36.9 C) Temp Source: Oral Pulse Rate: 77 Resp: 18 BP: (!) 133/47 Patient Position (if appropriate): Sitting Oxygen Therapy SpO2: 98 % Pain:  Pt with no c/o pain  See Function Navigator for Current Functional Status.   Therapy/Group: Individual Therapy  Rosalio LoudHOXIE, Devery Murgia 03/16/2016, 3:20 PM

## 2016-03-16 NOTE — Plan of Care (Signed)
Problem: RH SKIN INTEGRITY Goal: RH STG SKIN FREE OF INFECTION/BREAKDOWN Skin to remain free from infection and breakdown while on rehab with min assist  Outcome: Progressing Pt to identify strategies to maintain skin free of infection and breakdown and actively participate in these.

## 2016-03-16 NOTE — Plan of Care (Signed)
Problem: RH BOWEL ELIMINATION Goal: RH STG MANAGE BOWEL W/MEDICATION W/ASSISTANCE STG Manage Bowel with Medication with min Assistance.    Outcome: Progressing BM occurrence with patient statement of feeling much relief.

## 2016-03-16 NOTE — Plan of Care (Signed)
Problem: RH BOWEL ELIMINATION Goal: RH STG MANAGE BOWEL WITH ASSISTANCE STG Manage Bowel with min to mod Assistance.   Outcome: Progressing Pt is progressing in this, had 3 stools today, assistance being when pt calls independently and staff use Stedy (2person assist) to transport to bathroom.

## 2016-03-16 NOTE — Progress Notes (Signed)
Physical Therapy Session Note  Patient Details  Name: Megan Atkinson MRN: [id number] Date of Birth: 06/16/1950  Today's Date: 03/16/2016 PT Individual Time: 1540-1610 PT Individual Time Calculation (min): 30 min   Short Term Goals: Week 1:  PT Short Term Goal 1 (Week 1): Pt will demonstrate sit-to-stand transfers Min A with RW consistently PT Short Term Goal 2 (Week 1): Pt will ambulate 25 ft Mod A with RW  PT Short Term Goal 3 (Week 1): Pt will perform stand pivot transfer Mod A with RW consistently PT Short Term Goal 4 (Week 1): Pt will initiate car transfer training  Skilled Therapeutic Interventions/Progress Updates:    no c/o pain.  Session focus on sit<>stand transfers, midline orientation in standing, standing tolerance, and standing balance.  Pt performed sit<>stand from w/c with RUE support on table focus on pushing up and attaining upright, midline posture in standing with overall mod assist for transfer and to find midline.  Pt requires frequent min cues to shift weight to RLE for even weight bearing through LEs.  Pt engaged in table top task focus on use of RUE/LUE during task while maintaining balance in standing.  Pt able to tolerate 4 trials of standing from 5 minutes>1 minute at a time with increasing fatigue.  Pt returned to room at end of session and positioned in w/c with call bell in reach and needs met.   Therapy Documentation Precautions:  Precautions Precautions: Fall Precaution Comments:  (Left hemiparesis, vertigo ) Restrictions Weight Bearing Restrictions: No   See Function Navigator for Current Functional Status.   Therapy/Group: Individual Therapy  Earnest Conroy Penven-Crew 03/16/2016, 4:45 PM

## 2016-03-16 NOTE — Progress Notes (Signed)
Subjective/Complaints:  No issues overnite, discussed anxiety , tremors and ataxia due to cerebellar CVA Good BM after supp yest and spont BM this am ROS Negative CP, SOB, N/V/D Objective: Vital Signs: Blood pressure (!) 148/68, pulse 83, temperature 98.6 F (37 C), temperature source Oral, resp. rate 18, height 5\' 7"  (1.702 m), weight (!) 169.8 kg (374 lb 6.4 oz), SpO2 90 %. No results found. Results for orders placed or performed during the hospital encounter of 03/10/16 (from the past 72 hour(s))  Glucose, capillary     Status: None   Collection Time: 03/13/16 12:08 PM  Result Value Ref Range   Glucose-Capillary 87 65 - 99 mg/dL  Glucose, capillary     Status: Abnormal   Collection Time: 03/13/16  4:51 PM  Result Value Ref Range   Glucose-Capillary 114 (H) 65 - 99 mg/dL  Glucose, capillary     Status: Abnormal   Collection Time: 03/13/16  8:51 PM  Result Value Ref Range   Glucose-Capillary 115 (H) 65 - 99 mg/dL  Glucose, capillary     Status: Abnormal   Collection Time: 03/14/16  7:02 AM  Result Value Ref Range   Glucose-Capillary 101 (H) 65 - 99 mg/dL  Glucose, capillary     Status: Abnormal   Collection Time: 03/14/16 12:32 PM  Result Value Ref Range   Glucose-Capillary 113 (H) 65 - 99 mg/dL  Glucose, capillary     Status: None   Collection Time: 03/14/16  5:15 PM  Result Value Ref Range   Glucose-Capillary 92 65 - 99 mg/dL  Glucose, capillary     Status: Abnormal   Collection Time: 03/14/16  9:15 PM  Result Value Ref Range   Glucose-Capillary 117 (H) 65 - 99 mg/dL   Comment 1 Notify RN   Glucose, capillary     Status: None   Collection Time: 03/15/16  7:19 AM  Result Value Ref Range   Glucose-Capillary 99 65 - 99 mg/dL  Glucose, capillary     Status: None   Collection Time: 03/15/16 11:26 AM  Result Value Ref Range   Glucose-Capillary 96 65 - 99 mg/dL  Glucose, capillary     Status: Abnormal   Collection Time: 03/15/16  4:22 PM  Result Value Ref Range   Glucose-Capillary 120 (H) 65 - 99 mg/dL      General: No acute distress, Morbid obesity Mood and affect are appropriate Heart: Regular rate and rhythm no rubs murmurs or extra sounds Lungs: Clear to auscultation, breathing unlabored, no rales or wheezes Abdomen: Positive bowel sounds, soft nontender to palpation, nondistended Extremities: No clubbing, cyanosis, or edema Skin: No evidence of breakdown, no evidence of rash Neurologic: , motor strength is 5/5 in bilateral deltoid, bicep, tricep, grip,4 hip flexor, knee extensors, ankle dorsiflexor and plantar flexor  Cerebellar exam normal finger to nose to finger right side, mild/moderate dysmetria on left side Musculoskeletal: no pain with  range of motion in all 4 extremities. No joint swelling  Assessment/Plan: 1. Functional deficits secondary to Left cerebellar infarct which require 3+ hours per day of interdisciplinary therapy in a comprehensive inpatient rehab setting. Physiatrist is providing close team supervision and 24 hour management of active medical problems listed below. Physiatrist and rehab team continue to assess barriers to discharge/monitor patient progress toward functional and medical goals. FIM: Function - Bathing Position: Wheelchair/chair at sink Body parts bathed by patient: Right arm, Left arm, Chest, Abdomen, Right upper leg, Left upper leg, Right lower leg, Left lower leg, Back, Front  perineal area Body parts bathed by helper: Buttocks Bathing not applicable: Right lower leg, Left lower leg Assist Level: Touching or steadying assistance(Pt > 75%)  Function- Upper Body Dressing/Undressing What is the patient wearing?: Pull over shirt/dress Pull over shirt/dress - Perfomed by patient: Thread/unthread right sleeve, Thread/unthread left sleeve, Put head through opening, Pull shirt over trunk Assist Level: Set up Set up : To obtain clothing/put away Function - Lower Body Dressing/Undressing What is the patient  wearing?: Pants, Non-skid slipper socks, Shoes Position: Wheelchair/chair at sink Underwear - Performed by patient: Thread/unthread right underwear leg Underwear - Performed by helper: Thread/unthread left underwear leg, Pull underwear up/down Pants- Performed by patient: Thread/unthread right pants leg, Thread/unthread left pants leg, Pull pants up/down Pants- Performed by helper: Pull pants up/down Non-skid slipper socks- Performed by patient: Don/doff right sock, Don/doff left sock Non-skid slipper socks- Performed by helper: Don/doff right sock, Don/doff left sock Shoes - Performed by patient: Don/doff right shoe, Don/doff left shoe Shoes - Performed by helper: Fasten right, Fasten left Assist for footwear: Partial/moderate assist Assist for lower body dressing: Touching or steadying assistance (Pt > 75%) Assistive Device Comment: with use of shoe funnel  Function - Toileting Toileting steps completed by patient: Adjust clothing prior to toileting, Adjust clothing after toileting, Performs perineal hygiene Toileting steps completed by helper: Adjust clothing prior to toileting, Performs perineal hygiene, Adjust clothing after toileting Toileting Assistive Devices: Grab bar or rail Assist level: Two helpers  Function - ArchivistToilet Transfers Toilet transfer assistive device: Systems developerMechanical lift Mechanical lift: Stedy Assist level to toilet: 2 helpers Assist level from toilet: 2 helpers  Function - Chair/bed transfer Chair/bed transfer method: Lateral scoot, Squat pivot Chair/bed transfer assist level: Moderate assist (Pt 50 - 74%/lift or lower) Chair/bed transfer assistive device: Mechanical lift Mechanical lift: Stedy Chair/bed transfer details: Manual facilitation for placement, Manual facilitation for weight shifting, Verbal cues for technique, Verbal cues for sequencing, Verbal cues for precautions/safety, Verbal cues for safe use of DME/AE  Function - Locomotion: Wheelchair Will  patient use wheelchair at discharge?: Yes Type: Manual Max wheelchair distance: 120 Assist Level: Supervision or verbal cues Assist Level: Supervision or verbal cues Wheel 150 feet activity did not occur: Safety/medical concerns Assist Level: Supervision or verbal cues Turns around,maneuvers to table,bed, and toilet,negotiates 3% grade,maneuvers on rugs and over doorsills: No Function - Locomotion: Ambulation Assistive device: Walker-rolling Max distance: 12 ft Assist level: 2 helpers Assist level: 2 helpers Walk 50 feet with 2 turns activity did not occur: Safety/medical concerns Walk 150 feet activity did not occur: Safety/medical concerns Walk 10 feet on uneven surfaces activity did not occur: Safety/medical concerns  Function - Comprehension Comprehension: Auditory Comprehension assist level: Follows complex conversation/direction with no assist  Function - Expression Expression: Verbal Expression assist level: Expresses complex ideas: With no assist  Function - Social Interaction Social Interaction assist level: Interacts appropriately with others - No medications needed.  Function - Problem Solving Problem solving assist level: Solves complex problems: Recognizes & self-corrects  Function - Memory Memory assist level: Complete Independence: No helper Patient normally able to recall (first 3 days only): Current season, Location of own room, Staff names and faces, That he or she is in a hospital  Medical Problem List and Plan: 1. Ataxia and functional deficitssecondary to left cerebellar infarct  -CIR level PT, OT,Tent d/c 2/20 discussed 2. DVT Prophylaxis/Anticoagulation: Pharmaceutical: Lovenox 3. Pain Management: N/A 4. Mood: Team to provide ego support. LCSW to follow for evaluation and support.  5. Neuropsych: This patient iscapable of making decisions on herown behalf. 6. Skin/Wound Care: Routine pressure relief measures.  7.  Fluids/Electrolytes/Nutrition: Monitor I/O. Lytes nl  8. HTN: Monitor BP bid. Permissive HTN for 5-7 days.--resume Lasix not metoprolol Vitals:   03/15/16 1300 03/16/16 0500  BP: 133/63 (!) 148/68  Pulse: 79 83  Resp: 18 18  Temp: 98.5 F (36.9 C) 98.6 F (37 C)   9. OSA: Continue to encourage consistent use of CPAP 10. Morbid obesity: BMI 55.87. Will consult dietician to educate on CM/Heart healthy diet. Encourage appropriate diet and exercise to help promote weight loss to help promote health,  11. Pyuria: Ecoli UTI on Bactrim, no evidence of Side effectMay discontinue today 12. Prediabetes: Hgb A1C-6.3. CM diet reinforced. Monitor BS ac/hs for trends.  CBG (last 3)   Recent Labs  03/15/16 0719 03/15/16 1126 03/15/16 1622  GLUCAP 99 96 120*    13. CAD s/p CABG: On ASA and Lipitor   14.  Constipation relieved by supp, no results with  miralax qd, change to senna S Repeat Dulcolax helpful yesterday, Had BM this am without dulc LOS (Days) 6 A FACE TO FACE EVALUATION WAS PERFORMED  KIRSTEINS,ANDREW E 03/16/2016, 7:50 AM

## 2016-03-16 NOTE — Progress Notes (Signed)
Physical Therapy Session Note  Patient Details  Name: Megan Atkinson MRN: [id number] Date of Birth: [birth year]  Today's Date: 03/16/2016 PT Individual Time: 1105-1200 PT Individual Time Calculation (min): 55 min   Short Term Goals: Week 1:  PT Short Term Goal 1 (Week 1): Pt will demonstrate sit-to-stand transfers Min A with RW consistently PT Short Term Goal 2 (Week 1): Pt will ambulate 25 ft Mod A with RW  PT Short Term Goal 3 (Week 1): Pt will perform stand pivot transfer Mod A with RW consistently PT Short Term Goal 4 (Week 1): Pt will initiate car transfer training  Skilled Therapeutic Interventions/Progress Updates:    no c/o pain.  Session focus on activity tolerance, mobility, transfers, strengthening, and relaxation techniques.  Pt reporting that she does not believe the medication she takes for anxiety is helping, rather that it is making her dizzy and "funny feeling."  RN alerted.   Pt propelled w/c x130' with overall supervision, requiring more than a reasonable amount of time, frequent rest breaks, and mod cues for technique to maintain straight path.  Sit<>stand x2 from w/c using table top to push up with RUE, mod assist to rise.  Upon standing pt with increased anxious behavior requiring cues to slow breathing rate and relax shoulders.  Pt reporting dizziness that does not improve with time when standing and returned to seated position, BP in R forearm 177/69.  Remainder of session focus on education in deep breathing for relaxation, and LE strengthening.  Pt competed 3x10 reps of heel raises in sitting, 3x8 reps of seated marches, and 15 reps of seated glute squeezes with 3 second hold.  Pt returned to room at end of session and positioned upright with call bell in reach and needs met.   Therapy Documentation Precautions:  Precautions Precautions: Fall Precaution Comments:  (Left hemiparesis, vertigo ) Restrictions Weight Bearing Restrictions: No   See Function Navigator  for Current Functional Status.   Therapy/Group: Individual Therapy  Earnest Conroy Penven-Crew 03/16/2016, 12:59 PM

## 2016-03-16 NOTE — Evaluation (Signed)
Recreational Therapy Assessment and Plan  Patient Details  Name: Megan Atkinson MRN: [id number] Date of Birth: September 08, 1950 Today's Date: 03/16/2016  Rehab Potential:  Good  ELOS:   d/c 2/20  Assessment Clinical Impression: Problem List:      Patient Active Problem List   Diagnosis Date Noted  . Cerebellar stroke, acute ([place]) 03/10/2016  . Super obese ([place])   . Prediabetes   . Ataxia, post-stroke   . Acute cystitis without hematuria   . CVA (cerebral vascular accident) ([place]) 03/06/2016  . Morbid obesity ([place]) 02/29/2016  . OSA (obstructive sleep apnea) 11/24/2014  . Dyspnea 10/02/2014  . Anemia 04/05/2011  . Urinary tract infection, site not specified 04/05/2011  . Diabetes mellitus ([place]) 02/22/2011  . Hyperlipidemia 02/22/2011  . CAD (coronary artery disease) 01/07/2011  . Hypertension 01/07/2011    Past Medical History:      Past Medical History:  Diagnosis Date  . CAD (coronary artery disease)    small NSTEMI 11/12: LHC 01/06/11:  LAD 90-95%, mD1 90%, pD2 50%,  pOM1 50%, RCA occluded,  EF > 65%;  CABG  12/12: L-LAD  . Diabetes mellitus   . Glucose intolerance (impaired glucose tolerance)    Hemoglobin A1c 6.4 in 12/2010  . HTN (hypertension)    Echo 01/06/11: EF 65%  . Hyperlipidemia   . Obesity    Past Surgical History:       Past Surgical History:  Procedure Laterality Date  . ABDOMINAL HYSTERECTOMY    . APPENDECTOMY    . CORONARY ARTERY BYPASS GRAFT  01/12/2011   Procedure: CORONARY ARTERY BYPASS GRAFTING (CABG);  Surgeon: Gaye Pollack, MD;  Location: [place];  Service: Open Heart Surgery;  Laterality: N/A;  coronary artery bypass graft times one using left internal mammary artery . Attempted endoscopic saphenous vein harvest  . LEFT HEART CATHETERIZATION WITH CORONARY ANGIOGRAM N/A 01/06/2011   Procedure: LEFT HEART CATHETERIZATION WITH CORONARY ANGIOGRAM;  Surgeon: Minus Breeding, MD;  Location[facility] CATH LAB;  Service: Cardiovascular;   Laterality: N/A;    Assessment & Plan Clinical Impression: Megan Atkinson a 66 y.o.femalewith history of CAD, HTN, morbid obesity who was admitted on 03/06/16 with vertigo and left sided weakness with numbness. CT head reviewed, showing left cerebellar infarct,thought to be subacute. TPA not administered. CTA head/neck no significant stenosis in neck and moderate focal stenosis left VA. 2D echo with eF 60-65% with mild to moderately calcified aortic valve and grade I diastolic dysfunction. BLE dopplers negative for DVT. Dr. Erlinda Hong felt that stroke due to atherosclerosis and multiple risk factors. He recommended ASA/Plavix for three months followed by plavix alone. MBS done 1/30 revealing mild pharyngeal dysphagia with penetration of thin therefore chin tuck with thin liquids recommended. Swallow re-eval done today showing improvement and no need for chin tuck. Patient with resultant visual deficits with vestibular issues, difficulty with weight shifting, stooped posture and left lateral lean with ambulation. Therapy ongoing and CIR recommended for follow up therapy. Patient transferred to CIR on 03/10/2016.  Pt referred by team for TR participation with emphasis on relaxation training.  Met with pt briefly today to discuss TR services and relaxation techniques to assist with therapy participation.  Education provided on identifying stressors/anxieties, physical and emotional sign/symptoms and its effect on therapy task completion or home management.  With moderate questioning, pt identified sit-stands, standing balance/tolerance, weight distribution as anxiety causing tasks, but that she was able to manage without much intervention.  Educated pt on diaphragmatic breathing techniques including demonstration,  written handout and return demonstration.  Advised pt to use diaphragmatic breathing exercise prior to PT session as well as during PT exercise.  Pt stated understanding/agreeement.   Plan Min  1 TR session/group >20 minutes during LOS  Recommendations for other services: None   Discharge Criteria: Patient will be discharged from TR if patient refuses treatment 3 consecutive times without medical reason.  If treatment goals not met, if there is a change in medical status, if patient makes no progress towards goals or if patient is discharged from hospital.  The above assessment, treatment plan, treatment alternatives and goals were discussed and mutually agreed upon: by patient  Megan Atkinson 03/16/2016, 3:20 PM

## 2016-03-17 ENCOUNTER — Inpatient Hospital Stay (HOSPITAL_COMMUNITY): Payer: Medicare Other | Admitting: Occupational Therapy

## 2016-03-17 ENCOUNTER — Inpatient Hospital Stay (HOSPITAL_COMMUNITY): Payer: Medicare Other | Admitting: Physical Therapy

## 2016-03-17 DIAGNOSIS — F411 Generalized anxiety disorder: Secondary | ICD-10-CM

## 2016-03-17 LAB — BASIC METABOLIC PANEL
Anion gap: 9 (ref 5–15)
BUN: 25 mg/dL — AB (ref 6–20)
CO2: 25 mmol/L (ref 22–32)
Calcium: 9.4 mg/dL (ref 8.9–10.3)
Chloride: 104 mmol/L (ref 101–111)
Creatinine, Ser: 1.04 mg/dL — ABNORMAL HIGH (ref 0.44–1.00)
GFR calc Af Amer: >60 mL/min (ref >60)
GFR, EST NON AFRICAN AMERICAN: 54 mL/min — AB (ref >60)
GLUCOSE: 99 mg/dL (ref 65–99)
POTASSIUM: 4.1 mmol/L (ref 3.5–5.1)
Sodium: 138 mmol/L (ref 135–145)

## 2016-03-17 LAB — GLUCOSE, CAPILLARY
GLUCOSE-CAPILLARY: 146 mg/dL — AB (ref 65–99)
Glucose-Capillary: 97 mg/dL (ref 65–99)
Glucose-Capillary: 100 mg/dL — ABNORMAL HIGH (ref 65–99)
Glucose-Capillary: 102 mg/dL — ABNORMAL HIGH (ref 65–99)

## 2016-03-17 LAB — CBC
HEMATOCRIT: 39.7 % (ref 36.0–46.0)
Hemoglobin: 12.3 g/dL (ref 12.0–15.0)
MCH: 29.6 pg (ref 26.0–34.0)
MCHC: 31 g/dL (ref 30.0–36.0)
MCV: 95.7 fL (ref 78.0–100.0)
Platelets: 233 10*3/uL (ref 150–400)
RBC: 4.15 MIL/uL (ref 3.87–5.11)
RDW: 14.3 % (ref 11.5–15.5)
WBC: 8.2 10*3/uL (ref 4.0–10.5)

## 2016-03-17 MED ORDER — BUSPIRONE HCL 5 MG PO TABS: 5.0000 mg | ORAL_TABLET | Freq: Two times a day (BID) | ORAL | Status: DC | PRN

## 2016-03-17 MED ORDER — CITALOPRAM HYDROBROMIDE 10 MG PO TABS
10.0000 mg | ORAL_TABLET | Freq: Every day | ORAL | Status: DC
  Administered 2016-03-17 – 2016-03-30 (×14): 10 mg via ORAL
  Filled 2016-03-17 (×14): qty 1

## 2016-03-17 NOTE — Progress Notes (Signed)
Pt placed on Cpap 8.0 CMH20. Tolerating well.

## 2016-03-17 NOTE — Progress Notes (Signed)
Social Work Patient ID: Megan Atkinson, female   DOB: [birth year], 66 y.o.   MRN: [id number]  Met with pt to discuss financial concerns. She took early retirement at age 18 and then decided to return to work. She is not eligible for disability or able to pull her full retirement now since she already is receiving social security. She understood and will work something out. She wants to know when she can return to work Discussed up to MD but she need sot be mod/i level before attempting to do this. Made aware she can not drive when she first leaves here. Will follow up with MD in his office and then he will decide when safe to drive again.

## 2016-03-17 NOTE — Progress Notes (Addendum)
Subjective/Complaints: Pt feels dizzy in therapy after she takes Buspar Tolerates Celexa BM x 3 yesterday   ROS Negative CP, SOB, N/V/D Objective: Vital Signs: Blood pressure (!) 144/47, pulse 75, temperature 98.3 F (36.8 C), temperature source Oral, resp. rate 19, height '5\' 7"'  (1.702 m), weight (!) 169.8 kg (374 lb 6.4 oz), SpO2 99 %. No results found. Results for orders placed or performed during the hospital encounter of 03/10/16 (from the past 72 hour(s))  Glucose, capillary     Status: Abnormal   Collection Time: 03/14/16 12:32 PM  Result Value Ref Range   Glucose-Capillary 113 (H) 65 - 99 mg/dL  Glucose, capillary     Status: None   Collection Time: 03/14/16  5:15 PM  Result Value Ref Range   Glucose-Capillary 92 65 - 99 mg/dL  Glucose, capillary     Status: Abnormal   Collection Time: 03/14/16  9:15 PM  Result Value Ref Range   Glucose-Capillary 117 (H) 65 - 99 mg/dL   Comment 1 Notify RN   Glucose, capillary     Status: None   Collection Time: 03/15/16  7:19 AM  Result Value Ref Range   Glucose-Capillary 99 65 - 99 mg/dL  Glucose, capillary     Status: None   Collection Time: 03/15/16 11:26 AM  Result Value Ref Range   Glucose-Capillary 96 65 - 99 mg/dL  Glucose, capillary     Status: Abnormal   Collection Time: 03/15/16  4:22 PM  Result Value Ref Range   Glucose-Capillary 120 (H) 65 - 99 mg/dL  Glucose, capillary     Status: Abnormal   Collection Time: 03/15/16  8:37 PM  Result Value Ref Range   Glucose-Capillary 121 (H) 65 - 99 mg/dL  Glucose, capillary     Status: Abnormal   Collection Time: 03/16/16  6:21 AM  Result Value Ref Range   Glucose-Capillary 108 (H) 65 - 99 mg/dL   Comment 1 Notify RN   Basic metabolic panel     Status: Abnormal   Collection Time: 03/17/16  5:43 AM  Result Value Ref Range   Sodium 138 135 - 145 mmol/L   Potassium 4.1 3.5 - 5.1 mmol/L   Chloride 104 101 - 111 mmol/L   CO2 25 22 - 32 mmol/L   Glucose, Bld 99 65 - 99 mg/dL    BUN 25 (H) 6 - 20 mg/dL   Creatinine, Ser 1.04 (H) 0.44 - 1.00 mg/dL   Calcium 9.4 8.9 - 10.3 mg/dL   GFR calc non Af Amer 54 (L) >60 mL/min   GFR calc Af Amer >60 >60 mL/min    Comment: (NOTE) The eGFR has been calculated using the CKD EPI equation. This calculation has not been validated in all clinical situations. eGFR's persistently <60 mL/min signify possible Chronic Kidney Disease.    Anion gap 9 5 - 15  CBC     Status: None   Collection Time: 03/17/16  5:43 AM  Result Value Ref Range   WBC 8.2 4.0 - 10.5 K/uL   RBC 4.15 3.87 - 5.11 MIL/uL   Hemoglobin 12.3 12.0 - 15.0 g/dL   HCT 39.7 36.0 - 46.0 %   MCV 95.7 78.0 - 100.0 fL   MCH 29.6 26.0 - 34.0 pg   MCHC 31.0 30.0 - 36.0 g/dL   RDW 14.3 11.5 - 15.5 %   Platelets 233 150 - 400 K/uL      General: No acute distress, Morbid obesity Mood and affect are appropriate  Heart: Regular rate and rhythm no rubs murmurs or extra sounds Lungs: Clear to auscultation, breathing unlabored, no rales or wheezes Abdomen: Positive bowel sounds, soft nontender to palpation, nondistended Extremities: No clubbing, cyanosis, or edema Skin: No evidence of breakdown, no evidence of rash Neurologic: , motor strength is 5/5 in bilateral deltoid, bicep, tricep, grip,4 hip flexor, knee extensors, ankle dorsiflexor and plantar flexor  Cerebellar exam normal finger to nose to finger right side, mild/moderate dysmetria on left side Musculoskeletal: no pain with  range of motion in all 4 extremities. No joint swelling  Assessment/Plan: 1. Functional deficits secondary to Left cerebellar infarct which require 3+ hours per day of interdisciplinary therapy in a comprehensive inpatient rehab setting. Physiatrist is providing close team supervision and 24 hour management of active medical problems listed below. Physiatrist and rehab team continue to assess barriers to discharge/monitor patient progress toward functional and medical goals. FIM: Function  - Bathing Position: Shower (seated on BSC) Body parts bathed by patient: Right arm, Left arm, Chest, Abdomen, Front perineal area, Left upper leg, Right upper leg, Right lower leg, Left lower leg, Back Body parts bathed by helper: Buttocks Bathing not applicable: Buttocks Assist Level: Supervision or verbal cues, Set up, Assistive device Assistive Device Comment: with long handled sponge  Function- Upper Body Dressing/Undressing What is the patient wearing?: Pull over shirt/dress Pull over shirt/dress - Perfomed by patient: Thread/unthread right sleeve, Thread/unthread left sleeve, Put head through opening, Pull shirt over trunk Assist Level: Set up Set up : To obtain clothing/put away Function - Lower Body Dressing/Undressing What is the patient wearing?: Pants, Shoes Position: Wheelchair/chair at sink Underwear - Performed by patient: Thread/unthread right underwear leg Underwear - Performed by helper: Thread/unthread left underwear leg, Pull underwear up/down Pants- Performed by patient: Thread/unthread right pants leg, Thread/unthread left pants leg Pants- Performed by helper: Pull pants up/down Non-skid slipper socks- Performed by patient: Don/doff right sock, Don/doff left sock Non-skid slipper socks- Performed by helper: Don/doff right sock, Don/doff left sock Shoes - Performed by patient: Don/doff right shoe, Don/doff left shoe Shoes - Performed by helper: Fasten right, Fasten left Assist for footwear: Setup, Supervision/touching assist Assist for lower body dressing: Touching or steadying assistance (Pt > 75%) Assistive Device Comment: with use of shoe funnel  Function - Toileting Toileting steps completed by patient: Adjust clothing prior to toileting, Adjust clothing after toileting Toileting steps completed by helper: Performs perineal hygiene Toileting Assistive Devices: Grab bar or rail Assist level: Two helpers (use of [place])  Function - Air cabin crew  transfer assistive device: Facilities manager lift: Stedy Assist level to toilet: Dependent (Pt equals 0%) (min assist sit <> stand in [place]) Assist level from toilet: Dependent (Pt equals 0%)  Function - Chair/bed transfer Chair/bed transfer method: Lateral scoot, Squat pivot Chair/bed transfer assist level: dependent (Pt equals 0%) (min assist sit<> stand in [place]) Chair/bed transfer assistive device: Mechanical lift Mechanical lift: Stedy Chair/bed transfer details: Manual facilitation for placement, Manual facilitation for weight shifting, Verbal cues for technique, Verbal cues for sequencing, Verbal cues for precautions/safety, Verbal cues for safe use of DME/AE  Function - Locomotion: Wheelchair Will patient use wheelchair at discharge?: Yes Type: Manual Max wheelchair distance: 130 Assist Level: Supervision or verbal cues Assist Level: Supervision or verbal cues Wheel 150 feet activity did not occur: Safety/medical concerns Assist Level: Supervision or verbal cues Turns around,maneuvers to table,bed, and toilet,negotiates 3% grade,maneuvers on rugs and over doorsills: No Function - Locomotion: Ambulation Assistive device: Walker-rolling  Max distance: 12 ft Assist level: 2 helpers Assist level: 2 helpers Walk 50 feet with 2 turns activity did not occur: Safety/medical concerns Walk 150 feet activity did not occur: Safety/medical concerns Walk 10 feet on uneven surfaces activity did not occur: Safety/medical concerns  Function - Comprehension Comprehension: Auditory Comprehension assist level: Follows complex conversation/direction with no assist  Function - Expression Expression: Verbal Expression assist level: Expresses complex ideas: With no assist  Function - Social Interaction Social Interaction assist level: Interacts appropriately with others - No medications needed.  Function - Problem Solving Problem solving assist level: Solves complex problems:  Recognizes & self-corrects  Function - Memory Memory assist level: Complete Independence: No helper Patient normally able to recall (first 3 days only): Current season, Location of own room, Staff names and faces, That he or she is in a hospital  Medical Problem List and Plan: 1. Ataxia and functional deficitssecondary to left cerebellar infarct  -CIR level PT, OT,Tent d/c 2/20 , limited by fear of falling 2. DVT Prophylaxis/Anticoagulation: Pharmaceutical: Lovenox 3. Pain Management: N/A 4. Mood: Team to provide ego support. LCSW to follow for evaluation and support. Anxiety on Buspar and citalopram, gets dizzy with Buspar with change to PRN 5. Neuropsych: This patient iscapable of making decisions on herown behalf.Dr Sima Matas following for relaxation training 6. Skin/Wound Care: Routine pressure relief measures.  7. Fluids/Electrolytes/Nutrition: Monitor I/O. Lytes nl  8. HTN: Monitor BP bid. Permissive HTN for 5-7 days.--resume Lasix not metoprolol 2/9 Vitals:   03/16/16 1449 03/17/16 0538  BP: (!) 133/47 (!) 144/47  Pulse: 77 75  Resp: 18 19  Temp: 98.4 F (36.9 C) 98.3 F (36.8 C)   9. OSA: Continue to encourage consistent use of CPAP 10. Morbid obesity: BMI 55.87. Will consult dietician to educate on CM/Heart healthy diet. Encourage appropriate diet and exercise to help promote weight loss to help promote health,   12. Prediabetes: Hgb A1C-6.3. CM diet reinforced. Monitor BS ac/hs for trends.  CBG (last 3)   Recent Labs  03/15/16 1622 03/15/16 2037 03/16/16 0621  GLUCAP 120* 121* 108*    13. CAD s/p CABG: On ASA and Lipitor   14.  Constipation improved on senna S BID may reduce if BMs become excessive LOS (Days) 7 A FACE TO FACE EVALUATION WAS PERFORMED  Megan Atkinson E 03/17/2016, 7:37 AM

## 2016-03-17 NOTE — Progress Notes (Signed)
Physical Therapy Session Note  Patient Details  Name: Megan Atkinson MRN: [id number] Date of Birth: 1950-06-25  Today's Date: 03/17/2016 PT Individual Time: 1515-1540 PT Individual Time Calculation (min): 25 min   Short Term Goals: Week 2:  PT Short Term Goal 1 (Week 2): Pt will transfer with RW and min assist PT Short Term Goal 2 (Week 2): Pt will ambulate 1' with RW and mod assist PT Short Term Goal 3 (Week 2): Pt will negotiate 4 steps with 2 rails for strengthening.   Skilled Therapeutic Interventions/Progress Updates:    no c/o pain, agreeable to start session early.  Session focus on activity tolerance, strengthening,and transfers.    Pt transfers w/c<>therapy mat with max assist for squat/pivot to therapy mat (to L) and mod assist for squat/pivot back to w/c (to R) with mod multimodal cues for forward weight shift and head/hips relationship.  Pt completed 6 trials of zoom ball, max time 20 seconds, for UE strengthening and cardiovascular endurance while challenging midline orientation and use of LUE.  Pt completed 2x10 reps of seated hip flexion with BUE prop posteriorly to encourage use of hip flexors and decrease compensation with trunk musculature.  Pt returned to room at end of session and positioned in recliner with use of stedy to transfer (supervision for sit<>stand with pt pulling up on stedy).  Call bell in reach and needs met.   Therapy Documentation Precautions:  Precautions Precautions: Fall Precaution Comments:  (Left hemiparesis, vertigo ) Restrictions Weight Bearing Restrictions: No   See Function Navigator for Current Functional Status.   Therapy/Group: Individual Therapy  Megan Atkinson 03/17/2016, 3:41 PM

## 2016-03-17 NOTE — Progress Notes (Signed)
Physical Therapy Weekly Progress Note  Patient Details  Name: Megan Atkinson MRN: [id number] Date of Birth: [birth year]  Beginning of progress report period: March 11, 2016 End of progress report period: March 17, 2016  Today's Date: 03/17/2016 PT Individual Time: 1000-1100 PT Individual Time Calculation (min): 60 min   Patient has met 0 of 4 short term goals.  Pt has made slow progress with therapy this week and has been significantly limited by fear of falling and profound decrease in activity tolerance.  Therapy has been focusing on patient confidence, slow progression of mobility, and discussion with MD, Neuropsychologist, and recreational therapist for stress management and relaxation techniques to maximize performance in therapy sessions.   Patient continues to demonstrate the following deficits muscle weakness, decreased cardiorespiratoy endurance, impaired timing and sequencing, unbalanced muscle activation and decreased coordination, decreased midline orientation and decreased standing balance, decreased postural control and decreased balance strategies and therefore will continue to benefit from skilled PT intervention to increase functional independence with mobility.  Patient is beginning to progress towards LTGs at the end of this reporting period, though has made very slow progress.  Will reassess at next reporting period to determine if LTGS need to be adjusted. .     PT Short Term Goals Week 1:  PT Short Term Goal 1 (Week 1): Pt will demonstrate sit-to-stand transfers Min A with RW consistently PT Short Term Goal 1 - Progress (Week 1): Progressing toward goal PT Short Term Goal 2 (Week 1): Pt will ambulate 25 ft Mod A with RW  PT Short Term Goal 2 - Progress (Week 1): Not met PT Short Term Goal 3 (Week 1): Pt will perform stand pivot transfer Mod A with RW consistently PT Short Term Goal 3 - Progress (Week 1): Progressing toward goal PT Short Term Goal 4 (Week 1): Pt will  initiate car transfer training PT Short Term Goal 4 - Progress (Week 1): Not met Week 2:  PT Short Term Goal 1 (Week 2): Pt will transfer with RW and min assist PT Short Term Goal 2 (Week 2): Pt will ambulate 46' with RW and mod assist PT Short Term Goal 3 (Week 2): Pt will negotiate 4 steps with 2 rails for strengthening.   Skilled Therapeutic Interventions/Progress Updates:    no c/o pain.  Session focus on sit<>stand transfers, midline orientation, R weight shift, dynamic balance, and w/c propulsion.    Pt performs blocked practice sit<>stand transfers x6 focus on forward weight shift, pushing up using small table for RUE, attaining midline orientation in standing, and coordination.  Standing tolerance focus on dynamic balance, R weight shift, LUE NMR, and crossing midline during reaching activity.  Pt requires seated rest breaks following each standing trial.  Min cues throughout for deep breathing and pt noticeably less anxious during today's session.    W/C propulsion x60' with improved speed and supervision with min cues for attention to obstacles in L field.  Handoff to OT in hallway.  Therapy Documentation Precautions:  Precautions Precautions: Fall Precaution Comments:  (Left hemiparesis, vertigo ) Restrictions Weight Bearing Restrictions: No   See Function Navigator for Current Functional Status.  Therapy/Group: Individual Therapy  Elizeth Weinrich E Penven-Crew 03/17/2016, 12:08 PM

## 2016-03-17 NOTE — Progress Notes (Signed)
Placed pt on CPAP, Medium nasal mask. Pt tolerated well.

## 2016-03-17 NOTE — Progress Notes (Signed)
Occupational Therapy Session Note  Patient Details  Name: Sunnie NielsenBeverley Stipp MRN: [id number] Date of Birth: [birth year]  Today's Date: 03/17/2016 OT Individual Time: 1100-1157 and 1400-1458 OT Individual Time Calculation (min): 57 min and 58 min   Short Term Goals: Week 1:  OT Short Term Goal 1 (Week 1): Pt will complete toilet transfer with LRAD and Max A OT Short Term Goal 2 (Week 1): Pt will don socks with use of sock aide and supervision  OT Short Term Goal 3 (Week 1): Pt will complete sit<stand for LB dressing with Max A at sink or with RW OT Short Term Goal 4 (Week 1): Pt will complete bathing with Min A with AE as needed   Skilled Therapeutic Interventions/Progress Updates: Pt was received in hallway via PT handoff. Tx focus on standing endurance, activity tolerance, and minimizing pushing tendencies. Pt self propelled 60 feet to room with supervision for cues for left veering. Once in room, pt reported wanting to increase thoroughness with pericare. Sit<stand with Mod A at sink, pt able to lower/lift pants with unilateral support on sink to complete peri hygiene. Cues for proper hand placement during sit<stand transitions and for eccentric control when descending. Afterwards pt propelled partway to [facility] (for endurance) and participated in standing task focusing on reaching to right side with unilateral support on large table. Pt able participate in task for 2 minute intervals before requiring rest breaks. Cues provided for upright posture as well as for calming due to increased feelings of anxiety when standing. Pt selected music utilized for decreasing feelings of anxiety with pt reporting that it was helpful. Afterwards pt propelled 40 feet over carpet in manner as written above (reported feeling too fatigued to go any further). Pt was then taken back to room, discussed visualization strategies she could use to decrease feelings of anxiety during standing tasks in future. Pt was then left with  all needs within reach at time of departure.   2nd Session 1:1 tx (58 min) Pt was sitting in w/c at time of arrival, agreeable to session. Tx focus on UB strengthening and activity tolerance. Pt was taken to outdoor environment to increase psychosocial wellness. While outdoors, she engaged in bilateral UE exercises with orange theraband (and AAROM for left shoulder exercises with assist to prevent shoulder hike). Pt able to complete 2 sets x15 reps of each exercise with instruction on technique and visual demonstrations. Afterwards pt completed w/c mobility, weaving in and out of chairs/tables for endurance and UB strengthening. Min A for left turns, max cues to integrate bilateral LUEs instead of using right LE or pulling furniture items around her. At end of session pt was returned to room and left with all needs within reach.      Therapy Documentation Precautions:  Precautions Precautions: Fall Precaution Comments:  (Left hemiparesis, vertigo ) Restrictions Weight Bearing Restrictions: No  Pain: No c/o pain during session    ADL: ADL ADL Comments: Please see functional navigator for ADL status    See Function Navigator for Current Functional Status.   Therapy/Group: Individual Therapy  Suprena Travaglini A Roark Rufo 03/17/2016, 12:16 PM

## 2016-03-18 ENCOUNTER — Inpatient Hospital Stay (HOSPITAL_COMMUNITY): Payer: Medicare Other | Admitting: Occupational Therapy

## 2016-03-18 DIAGNOSIS — R7303 Prediabetes: Secondary | ICD-10-CM

## 2016-03-18 DIAGNOSIS — F411 Generalized anxiety disorder: Secondary | ICD-10-CM

## 2016-03-18 DIAGNOSIS — I10 Essential (primary) hypertension: Secondary | ICD-10-CM

## 2016-03-18 DIAGNOSIS — K5901 Slow transit constipation: Secondary | ICD-10-CM

## 2016-03-18 MED ORDER — METOPROLOL TARTRATE 12.5 MG HALF TABLET
12.5000 mg | ORAL_TABLET | Freq: Two times a day (BID) | ORAL | Status: DC
  Administered 2016-03-18 – 2016-03-31 (×27): 12.5 mg via ORAL
  Filled 2016-03-18 (×28): qty 1

## 2016-03-18 NOTE — Progress Notes (Signed)
Placed patient on CPAP via nasal mask, previous settings of 8.0 cm H20. 21% FIO2. Tolerating well at this time.

## 2016-03-18 NOTE — Progress Notes (Signed)
Occupational Therapy Session Note  Patient Details  Name: Megan NielsenBeverley Atkinson MRN: [id number] Date of Birth: Jun 13, 1950  Today's Date: 03/18/2016 OT Individual Time: 1102-1159 OT Individual Time Calculation (min): 57 min   Skilled Therapeutic Interventions/Progress Updates: Pt was up in w/c at time of arrival, agreeable to session. Tx focus on functional toilet/tub bench transfers without use of Stedy. Practiced stand step transfers<w/c<drop arm commode with use of grab bars. Pt initially requiring Max A, but with practice she completed with Mod A for weight shifting and cues for stepping instead of shuffling feet, and for leaning to right during power up to minimize left pushing.  Also practiced stand [facility] with pt able to carryover education on technique with min cues. She completed with Mod A. Sit<stands (with Min A) completed with RW pressed against sink to increase pt tolerance of using RW. She was able to stand for 3 minutes without rest breaks while completing small squats for LE strengthening. At time of departure she was left with all needs within reach.      Therapy Documentation Precautions:  Precautions Precautions: Fall Precaution Comments:  (Left hemiparesis, vertigo ) Restrictions Weight Bearing Restrictions: No General:   Vital Signs: Therapy Vitals Pulse Rate: 71 BP: (!) 156/76 Patient Position (if appropriate): Lying Pain: No c/o pain during session    ADL: ADL ADL Comments: Please see functional navigator for ADL status    See Function Navigator for Current Functional Status.   Therapy/Group: Individual Therapy  Haynes Giannotti A [place] Facchini 03/18/2016, 12:29 PM

## 2016-03-18 NOTE — Progress Notes (Signed)
Subjective/Complaints: Pt seen laying in bed this AM with CPAP in place.  She states she slept well overnight.  She is in good spirits.   ROS: Denies CP, SOB, N/V/D  Objective: Vital Signs: Blood pressure (!) 156/76, pulse 71, temperature 98.3 F (36.8 C), temperature source Oral, resp. rate 18, height '5\' 7"'  (1.702 m), weight (!) 169.8 kg (374 lb 6.4 oz), SpO2 98 %. No results found. Results for orders placed or performed during the hospital encounter of 03/10/16 (from the past 72 hour(s))  Glucose, capillary     Status: None   Collection Time: 03/15/16 11:26 AM  Result Value Ref Range   Glucose-Capillary 96 65 - 99 mg/dL  Glucose, capillary     Status: Abnormal   Collection Time: 03/15/16  4:22 PM  Result Value Ref Range   Glucose-Capillary 120 (H) 65 - 99 mg/dL  Glucose, capillary     Status: Abnormal   Collection Time: 03/15/16  8:37 PM  Result Value Ref Range   Glucose-Capillary 121 (H) 65 - 99 mg/dL  Glucose, capillary     Status: Abnormal   Collection Time: 03/16/16  6:21 AM  Result Value Ref Range   Glucose-Capillary 108 (H) 65 - 99 mg/dL   Comment 1 Notify RN   Glucose, capillary     Status: None   Collection Time: 03/16/16 12:21 PM  Result Value Ref Range   Glucose-Capillary 97 65 - 99 mg/dL  Glucose, capillary     Status: Abnormal   Collection Time: 03/16/16  4:40 PM  Result Value Ref Range   Glucose-Capillary 146 (H) 65 - 99 mg/dL  Glucose, capillary     Status: Abnormal   Collection Time: 03/16/16  9:01 PM  Result Value Ref Range   Glucose-Capillary 100 (H) 65 - 99 mg/dL  Basic metabolic panel     Status: Abnormal   Collection Time: 03/17/16  5:43 AM  Result Value Ref Range   Sodium 138 135 - 145 mmol/L   Potassium 4.1 3.5 - 5.1 mmol/L   Chloride 104 101 - 111 mmol/L   CO2 25 22 - 32 mmol/L   Glucose, Bld 99 65 - 99 mg/dL   BUN 25 (H) 6 - 20 mg/dL   Creatinine, Ser 1.04 (H) 0.44 - 1.00 mg/dL   Calcium 9.4 8.9 - 10.3 mg/dL   GFR calc non Af Amer 54 (L)  >60 mL/min   GFR calc Af Amer >60 >60 mL/min    Comment: (NOTE) The eGFR has been calculated using the CKD EPI equation. This calculation has not been validated in all clinical situations. eGFR's persistently <60 mL/min signify possible Chronic Kidney Disease.    Anion gap 9 5 - 15  CBC     Status: None   Collection Time: 03/17/16  5:43 AM  Result Value Ref Range   WBC 8.2 4.0 - 10.5 K/uL   RBC 4.15 3.87 - 5.11 MIL/uL   Hemoglobin 12.3 12.0 - 15.0 g/dL   HCT 39.7 36.0 - 46.0 %   MCV 95.7 78.0 - 100.0 fL   MCH 29.6 26.0 - 34.0 pg   MCHC 31.0 30.0 - 36.0 g/dL   RDW 14.3 11.5 - 15.5 %   Platelets 233 150 - 400 K/uL  Glucose, capillary     Status: Abnormal   Collection Time: 03/17/16  6:36 AM  Result Value Ref Range   Glucose-Capillary 102 (H) 65 - 99 mg/dL      General: No acute distress, Morbid obesity Heart:  Regular rate and rhythm. No JVD.  Lungs: Clear to auscultation, breathing unlabored Abdomen: Positive bowel sounds, soft  Musculoskeletal: No edema. No tenderness Neurologic: Alert Motor 5/5 in bilateral deltoid, bicep, tricep, grip,4+/5 hip flexor, knee extensors, ankle dorsiflexor and plantar flexor B/l ataxia L>R Skin: No evidence of breakdown, no evidence of rash Psych: Mood and affect are appropriate   Assessment/Plan: 1. Functional deficits secondary to Left cerebellar infarct which require 3+ hours per day of interdisciplinary therapy in a comprehensive inpatient rehab setting. Physiatrist is providing close team supervision and 24 hour management of active medical problems listed below. Physiatrist and rehab team continue to assess barriers to discharge/monitor patient progress toward functional and medical goals. FIM: Function - Bathing Position: Shower (seated on BSC) Body parts bathed by patient: Right arm, Left arm, Chest, Abdomen, Front perineal area, Left upper leg, Right upper leg, Right lower leg, Left lower leg, Back Body parts bathed by helper:  Buttocks Bathing not applicable: Buttocks Assist Level: Supervision or verbal cues, Set up, Assistive device Assistive Device Comment: with long handled sponge  Function- Upper Body Dressing/Undressing What is the patient wearing?: Pull over shirt/dress Pull over shirt/dress - Perfomed by patient: Thread/unthread right sleeve, Thread/unthread left sleeve, Put head through opening, Pull shirt over trunk Assist Level: Set up Set up : To obtain clothing/put away Function - Lower Body Dressing/Undressing What is the patient wearing?: Pants, Shoes Position: Wheelchair/chair at sink Underwear - Performed by patient: Thread/unthread right underwear leg Underwear - Performed by helper: Thread/unthread left underwear leg, Pull underwear up/down Pants- Performed by patient: Thread/unthread right pants leg, Thread/unthread left pants leg Pants- Performed by helper: Pull pants up/down Non-skid slipper socks- Performed by patient: Don/doff right sock, Don/doff left sock Non-skid slipper socks- Performed by helper: Don/doff right sock, Don/doff left sock Shoes - Performed by patient: Don/doff right shoe, Don/doff left shoe Shoes - Performed by helper: Fasten right, Fasten left Assist for footwear: Setup, Supervision/touching assist Assist for lower body dressing: Touching or steadying assistance (Pt > 75%) Assistive Device Comment: with use of shoe funnel  Function - Toileting Toileting steps completed by patient: Adjust clothing prior to toileting, Adjust clothing after toileting Toileting steps completed by helper: Performs perineal hygiene Toileting Assistive Devices: Grab bar or rail Assist level: Two helpers (use of [place])  Function - Air cabin crew transfer assistive device: Facilities manager lift: Stedy Assist level to toilet: Dependent (Pt equals 0%) Assist level from toilet: Dependent (Pt equals 0%)  Function - Chair/bed transfer Chair/bed transfer method: Lateral  scoot, Squat pivot Chair/bed transfer assist level: dependent (Pt equals 0%) (min assist sit<> stand in [place]) Chair/bed transfer assistive device: Mechanical lift Mechanical lift: Stedy Chair/bed transfer details: Manual facilitation for placement, Manual facilitation for weight shifting, Verbal cues for technique, Verbal cues for sequencing, Verbal cues for precautions/safety, Verbal cues for safe use of DME/AE  Function - Locomotion: Wheelchair Will patient use wheelchair at discharge?: Yes Type: Manual Max wheelchair distance: 60 Assist Level: Supervision or verbal cues Assist Level: Supervision or verbal cues Wheel 150 feet activity did not occur: Safety/medical concerns Assist Level: Supervision or verbal cues Turns around,maneuvers to table,bed, and toilet,negotiates 3% grade,maneuvers on rugs and over doorsills: No Function - Locomotion: Ambulation Assistive device: Walker-rolling Max distance: 12 ft Assist level: 2 helpers Assist level: 2 helpers Walk 50 feet with 2 turns activity did not occur: Safety/medical concerns Walk 150 feet activity did not occur: Safety/medical concerns Walk 10 feet on uneven surfaces activity did not  occur: Safety/medical concerns  Function - Comprehension Comprehension: Auditory Comprehension assist level: Follows complex conversation/direction with no assist  Function - Expression Expression: Verbal Expression assist level: Expresses complex ideas: With no assist  Function - Social Interaction Social Interaction assist level: Interacts appropriately with others - No medications needed.  Function - Problem Solving Problem solving assist level: Solves complex problems: Recognizes & self-corrects  Function - Memory Memory assist level: Complete Independence: No helper Patient normally able to recall (first 3 days only): Current season, Location of own room, Staff names and faces, That he or she is in a hospital  Medical Problem List and  Plan: 1. Ataxia and functional deficitssecondary to left cerebellar infarct  -Cont CIR  2. DVT Prophylaxis/Anticoagulation: Pharmaceutical: Lovenox 3. Pain Management: N/A 4. Mood: Team to provide ego support. LCSW to follow for evaluation and support. Anxiety on Buspar and citalopram, gets dizzy with Buspar changed to PRN 5. Neuropsych: This patient iscapable of making decisions on herown behalf. Dr Sima Matas following for relaxation training 6. Skin/Wound Care: Routine pressure relief measures.  7. Fluids/Electrolytes/Nutrition: Monitor I/O.   BMP within acceptable range on 2/9  Encourage fluids 8. HTN: Monitor BP bid. resumed Lasix 2/9  Metoprolol 12.5 restarted 2/10 (home 50) 9. OSA: Continue to encourage consistent use of CPAP 10. Morbid obesity: BMI 55.87. consult dietician to educate on CM/Heart healthy diet. Encourage appropriate diet and exercise to help promote weight loss to help promote health  12. Prediabetes: Hgb A1C-6.3. CM diet reinforced. Monitor BS ac/hs for trends.   Slightly elevated, but overall controlled 2/10 13. CAD s/p CABG: On ASA and Lipitor  14.  Constipation improved on senna S BID may reduce if BMs become excessive  LOS (Days) 8 A FACE TO FACE EVALUATION WAS PERFORMED  Kymora Sciara Lorie Phenix 03/18/2016, 11:04 AM

## 2016-03-19 ENCOUNTER — Inpatient Hospital Stay (HOSPITAL_COMMUNITY): Payer: Medicare Other | Admitting: Physical Therapy

## 2016-03-19 NOTE — Progress Notes (Signed)
Patient placed on nasal CPAP and doing well. No issues at this time.

## 2016-03-19 NOTE — Progress Notes (Signed)
Subjective/Complaints: Pt seen laying in bed this AM.  She slept well overnight and is pleasant.  She requests qHS snack.  ROS: Denies CP, SOB, N/V/D  Objective: Vital Signs: Blood pressure (!) 126/57, pulse 68, temperature 97.8 F (36.6 C), temperature source Oral, resp. rate 18, height '5\' 7"'  (1.702 m), weight (!) 169.8 kg (374 lb 6.4 oz), SpO2 97 %. No results found. Results for orders placed or performed during the hospital encounter of 03/10/16 (from the past 72 hour(s))  Glucose, capillary     Status: Abnormal   Collection Time: 03/16/16  4:40 PM  Result Value Ref Range   Glucose-Capillary 146 (H) 65 - 99 mg/dL  Glucose, capillary     Status: Abnormal   Collection Time: 03/16/16  9:01 PM  Result Value Ref Range   Glucose-Capillary 100 (H) 65 - 99 mg/dL  Basic metabolic panel     Status: Abnormal   Collection Time: 03/17/16  5:43 AM  Result Value Ref Range   Sodium 138 135 - 145 mmol/L   Potassium 4.1 3.5 - 5.1 mmol/L   Chloride 104 101 - 111 mmol/L   CO2 25 22 - 32 mmol/L   Glucose, Bld 99 65 - 99 mg/dL   BUN 25 (H) 6 - 20 mg/dL   Creatinine, Ser 1.04 (H) 0.44 - 1.00 mg/dL   Calcium 9.4 8.9 - 10.3 mg/dL   GFR calc non Af Amer 54 (L) >60 mL/min   GFR calc Af Amer >60 >60 mL/min    Comment: (NOTE) The eGFR has been calculated using the CKD EPI equation. This calculation has not been validated in all clinical situations. eGFR's persistently <60 mL/min signify possible Chronic Kidney Disease.    Anion gap 9 5 - 15  CBC     Status: None   Collection Time: 03/17/16  5:43 AM  Result Value Ref Range   WBC 8.2 4.0 - 10.5 K/uL   RBC 4.15 3.87 - 5.11 MIL/uL   Hemoglobin 12.3 12.0 - 15.0 g/dL   HCT 39.7 36.0 - 46.0 %   MCV 95.7 78.0 - 100.0 fL   MCH 29.6 26.0 - 34.0 pg   MCHC 31.0 30.0 - 36.0 g/dL   RDW 14.3 11.5 - 15.5 %   Platelets 233 150 - 400 K/uL  Glucose, capillary     Status: Abnormal   Collection Time: 03/17/16  6:36 AM  Result Value Ref Range    Glucose-Capillary 102 (H) 65 - 99 mg/dL      General: No acute distress, Morbid obesity Heart: RRR. No JVD.  Lungs: Clear to auscultation, breathing unlabored Abdomen: Positive bowel sounds, soft  Musculoskeletal: No edema. No tenderness Neurologic: Alert Motor 5/5 in bilateral deltoid, bicep, tricep, grip,4+/5 hip flexor, knee extensors, ankle dorsiflexor and plantar flexor B/l ataxia L>R (unchanged) Skin: No evidence of breakdown, no evidence of rash Psych: Mood and affect are appropriate   Assessment/Plan: 1. Functional deficits secondary to Left cerebellar infarct which require 3+ hours per day of interdisciplinary therapy in a comprehensive inpatient rehab setting. Physiatrist is providing close team supervision and 24 hour management of active medical problems listed below. Physiatrist and rehab team continue to assess barriers to discharge/monitor patient progress toward functional and medical goals. FIM: Function - Bathing Position: Shower (seated on BSC) Body parts bathed by patient: Right arm, Left arm, Chest, Abdomen, Front perineal area, Left upper leg, Right upper leg, Right lower leg, Left lower leg, Back Body parts bathed by helper: Buttocks Bathing not applicable:  Buttocks Assist Level: Supervision or verbal cues, Set up, Assistive device Assistive Device Comment: with long handled sponge  Function- Upper Body Dressing/Undressing What is the patient wearing?: Pull over shirt/dress Pull over shirt/dress - Perfomed by patient: Thread/unthread right sleeve, Thread/unthread left sleeve, Put head through opening, Pull shirt over trunk Assist Level: Set up Set up : To obtain clothing/put away Function - Lower Body Dressing/Undressing What is the patient wearing?: Pants, Shoes Position: Wheelchair/chair at sink Underwear - Performed by patient: Thread/unthread right underwear leg Underwear - Performed by helper: Thread/unthread left underwear leg, Pull underwear  up/down Pants- Performed by patient: Thread/unthread right pants leg, Thread/unthread left pants leg Pants- Performed by helper: Pull pants up/down Non-skid slipper socks- Performed by patient: Don/doff right sock, Don/doff left sock Non-skid slipper socks- Performed by helper: Don/doff right sock, Don/doff left sock Shoes - Performed by patient: Don/doff right shoe, Don/doff left shoe Shoes - Performed by helper: Fasten right, Fasten left Assist for footwear: Setup, Supervision/touching assist Assist for lower body dressing: Touching or steadying assistance (Pt > 75%) Assistive Device Comment: with use of shoe funnel  Function - Toileting Toileting steps completed by patient: Adjust clothing prior to toileting, Adjust clothing after toileting Toileting steps completed by helper: Performs perineal hygiene Toileting Assistive Devices: Grab bar or rail Assist level: Two helpers (use of [place])  Function - Air cabin crew transfer assistive device: Elevated toilet seat/BSC over toilet, Grab bar Mechanical lift: Stedy Assist level to toilet: Moderate assist (Pt 50 - 74%/lift or lower) Assist level from toilet: Moderate assist (Pt 50 - 74%/lift or lower)  Function - Chair/bed transfer Chair/bed transfer method: Lateral scoot, Squat pivot Chair/bed transfer assist level: dependent (Pt equals 0%) (min assist sit<> stand in [place]) Chair/bed transfer assistive device: Mechanical lift Mechanical lift: Stedy Chair/bed transfer details: Manual facilitation for placement, Manual facilitation for weight shifting, Verbal cues for technique, Verbal cues for sequencing, Verbal cues for precautions/safety, Verbal cues for safe use of DME/AE  Function - Locomotion: Wheelchair Will patient use wheelchair at discharge?: Yes Type: Manual Max wheelchair distance: 50 ft Assist Level: Supervision or verbal cues Assist Level: Supervision or verbal cues Wheel 150 feet activity did not occur:  Safety/medical concerns Assist Level: Supervision or verbal cues Turns around,maneuvers to table,bed, and toilet,negotiates 3% grade,maneuvers on rugs and over doorsills: No Function - Locomotion: Ambulation Assistive device: Walker-rolling Max distance: 12 ft Assist level: 2 helpers Assist level: 2 helpers Walk 50 feet with 2 turns activity did not occur: Safety/medical concerns Walk 150 feet activity did not occur: Safety/medical concerns Walk 10 feet on uneven surfaces activity did not occur: Safety/medical concerns  Function - Comprehension Comprehension: Auditory Comprehension assist level: Follows complex conversation/direction with no assist  Function - Expression Expression: Verbal Expression assist level: Expresses complex ideas: With no assist  Function - Social Interaction Social Interaction assist level: Interacts appropriately with others - No medications needed.  Function - Problem Solving Problem solving assist level: Solves complex problems: Recognizes & self-corrects  Function - Memory Memory assist level: Complete Independence: No helper Patient normally able to recall (first 3 days only): Current season, Location of own room, Staff names and faces, That he or she is in a hospital  Medical Problem List and Plan: 1. Ataxia and functional deficitssecondary to left cerebellar infarct  -Cont CIR  2. DVT Prophylaxis/Anticoagulation: Pharmaceutical: Lovenox 3. Pain Management: N/A 4. Mood: Team to provide ego support. LCSW to follow for evaluation and support. Anxiety on Buspar and citalopram, gets  dizzy with Buspar changed to PRN 5. Neuropsych: This patient iscapable of making decisions on herown behalf. Dr Sima Matas following for relaxation training 6. Skin/Wound Care: Routine pressure relief measures.  7. Fluids/Electrolytes/Nutrition: Monitor I/O.   BMP within acceptable range on 2/9  Encourage fluids  Requests qHS snack 8. HTN: Monitor BP  bid.   Resumed Lasix 2/9  Metoprolol 12.5 restarted 2/10 (home 50)  Improved 2/11 9. OSA: Continue to encourage consistent use of CPAP 10. Morbid obesity: BMI 55.87. consult dietician to educate on CM/Heart healthy diet. Encourage appropriate diet and exercise to help promote weight loss to help promote health  12. Prediabetes: Hgb A1C-6.3. CM diet reinforced. Monitor BS ac/hs for trends.   Slightly elevated, but overall controlled 2/9 13. CAD s/p CABG: On ASA and Lipitor  14.  Constipation improved on senna S BID may reduce if BMs become excessive  LOS (Days) 9 A FACE TO FACE EVALUATION WAS PERFORMED  Trystan Akhtar Lorie Phenix 03/19/2016, 3:27 PM

## 2016-03-19 NOTE — Progress Notes (Signed)
CBG-87 this AM. Numbers not transferring over. Megan Atkinson, Megan Atkinson

## 2016-03-19 NOTE — Progress Notes (Signed)
Physical Therapy Session Note  Patient Details  Name: Sunnie NielsenBeverley Choo MRN: [id number] Date of Birth: [birth year]  Today's Date: 03/19/2016 PT Individual Time: 1015-1057 PT Individual Time Calculation (min): 42 min   Short Term Goals: Week 2:  PT Short Term Goal 1 (Week 2): Pt will transfer with RW and min assist PT Short Term Goal 2 (Week 2): Pt will ambulate 4025' with RW and mod assist PT Short Term Goal 3 (Week 2): Pt will negotiate 4 steps with 2 rails for strengthening.   Skilled Therapeutic Interventions/Progress Updates:    Pt received in w/c & agreeable to tx, denying c/o pain at rest but noting pain in LLE with movement and standing. Pt propelled w/c room<>gym x 50 ft + 50 ft with RLE and occasional use of BUE with supervision and extra time; pt requires multiple rest breaks 2/2 fatigue. In gym pt transferred sit<>stand with steady assist and parallel bars. Pt tolerated standing 1-3 minutes at a time with BUE support and steady assist. Pt participated in marching in place then single steps forwards/backwards for pre-gait training. Pt requires cuing for midline orientation as pt tends to push L, as well as cuing for RLE foot clearance. Pt able to weight bear through LLE without any buckling noted. Pt transitioned to using cybex kinetron in sitting for BLE strengthening & neuro re-ed. At end of session pt left sitting in w/c in room with all needs within reach.   Therapy Documentation Precautions:  Precautions Precautions: Fall Precaution Comments:  (Left hemiparesis, vertigo ) Restrictions Weight Bearing Restrictions: No   See Function Navigator for Current Functional Status.   Therapy/Group: Individual Therapy  Sandi MariscalVictoria M Deziya Amero 03/19/2016, 11:48 AM

## 2016-03-20 ENCOUNTER — Inpatient Hospital Stay (HOSPITAL_COMMUNITY): Payer: Medicare Other | Admitting: Occupational Therapy

## 2016-03-20 ENCOUNTER — Inpatient Hospital Stay (HOSPITAL_COMMUNITY): Payer: Medicare Other | Admitting: Physical Therapy

## 2016-03-20 LAB — GLUCOSE, CAPILLARY
GLUCOSE-CAPILLARY: 92 mg/dL (ref 65–99)
GLUCOSE-CAPILLARY: 127 mg/dL — AB (ref 65–99)
GLUCOSE-CAPILLARY: 120 mg/dL — AB (ref 65–99)
GLUCOSE-CAPILLARY: 120 mg/dL — AB (ref 65–99)
GLUCOSE-CAPILLARY: 123 mg/dL — AB (ref 65–99)
GLUCOSE-CAPILLARY: 87 mg/dL (ref 65–99)
GLUCOSE-CAPILLARY: 100 mg/dL — AB (ref 65–99)
GLUCOSE-CAPILLARY: 100 mg/dL — AB (ref 65–99)
Glucose-Capillary: 87 mg/dL (ref 65–99)
Glucose-Capillary: 93 mg/dL (ref 65–99)
Glucose-Capillary: 97 mg/dL (ref 65–99)
Glucose-Capillary: 104 mg/dL — ABNORMAL HIGH (ref 65–99)
Glucose-Capillary: 87 mg/dL (ref 65–99)

## 2016-03-20 NOTE — Progress Notes (Addendum)
Physical Therapy Session Note  Patient Details  Name: Megan Atkinson MRN: [id number] Date of Birth: 1951-01-25  Today's Date: 03/20/2016 PT Individual Time: 1000-1100 PT Individual Time Calculation (min): 60 min   Short Term Goals: Week 2:  PT Short Term Goal 1 (Week 2): Pt will transfer with RW and min assist PT Short Term Goal 2 (Week 2): Pt will ambulate 46' with RW and mod assist PT Short Term Goal 3 (Week 2): Pt will negotiate 4 steps with 2 rails for strengthening.   Skilled Therapeutic Interventions/Progress Updates:    no c/o pain.  Session focus on transfers, gait, strengthening, and activity tolerance.   Sit<>stand x3 from w/c with RUE pushing from table and LUE pushing from w/c with overall steady assist focus on transition from push up to UE support on RW.  In standing pt performs 5-10 reps of marching, progress to ambulating 6'+8'+20' with RW and w/c follow for safety.  With gait distances up to 10' pt requires only min assist for gait but progresses to max assist with L lean when fatigued.  W/C follow with gait to increase pt confidence and comfort to maximize gait distance for mobility and activity tolerance.  Cues provided for BOS, midline orientation, and step length.  LE NMR and strengthening with toe taps to 1.5" (20 reps) and 4" (2x8 reps) box from seated position focus on hip flexion and DF to clear box.  Discussed carry over into gait pattern with DF and hip flexion and pt verbalized understanding.    Pt returned to room at end of session and positioned with call bell in reach and needs met.   Therapy Documentation Precautions:  Precautions Precautions: Fall Precaution Comments:  (Left hemiparesis, vertigo ) Restrictions Weight Bearing Restrictions: No   See Function Navigator for Current Functional Status.   Therapy/Group: Individual Therapy  Earnest Conroy Penven-Crew 03/20/2016, 10:41 AM

## 2016-03-20 NOTE — Progress Notes (Signed)
Occupational Therapy Session Note  Patient Details  Name: Megan NielsenBeverley Atkinson MRN: [id number] Date of Birth: Nov 17, 1950  Today's Date: 03/20/2016 OT Individual Time: 1431-1501 OT Individual Time Calculation (min): 30 min    Short Term Goals: Week 2:  OT Short Term Goal 1 (Week 2): Pt will complete toilet transfers with supervision with RW OT Short Term Goal 2 (Week 2): Pt will complete LB dressing at sit > stand level with supervision/setup OT Short Term Goal 3 (Week 2): Pt will complete 2 grooming tasks in standing to increase standing tolerance/balance  Skilled Therapeutic Interventions/Progress Updates:    Pt completed sit to stand intervals with use of the RW for support after achieving standing.  Mod assist for initial sit to stand from the wheelchair secondary to pushing to the left with standing.  She needed mod instructional cueing for foot placement and scooting forward to the edge of the wheelchair.  Instructed pt to push off the front lower part of the arm rests as reaching back and pushing off of the upper part was decreasing her forward trunk flexion.  She transferred from wheelchair to the regular chair with the RW and max assist.  Decreased weightshift noted when attempting to advance the LLE as well as LOB to the left.  Pt left in wheelchair at end of session with call button and phone in reach.    Therapy Documentation Precautions:  Precautions Precautions: Fall Precaution Comments:  (Left hemiparesis, vertigo ) Restrictions Weight Bearing Restrictions: No  Pain: Pain Assessment Pain Assessment: No/denies pain ADL: See Function Navigator for Current Functional Status.   Therapy/Group: Individual Therapy  Wilmarie Sparlin OTR/L 03/20/2016, 3:38 PM

## 2016-03-20 NOTE — Progress Notes (Signed)
Subjective/Complaints: Pt seen laying in bed this AM.  She slept "very well" overnight.  She states she did not get her qHS snack, "but that's okay".  ROS: Denies CP, SOB, N/V/D  Objective: Vital Signs: Blood pressure (!) 129/58, pulse 70, temperature 98.4 F (36.9 C), temperature source Oral, resp. rate 18, height 5\' 7"  (1.702 m), weight (!) 169.8 kg (374 lb 6.4 oz), SpO2 97 %. No results found. No results found for this or any previous visit (from the past 72 hour(s)).    General: No acute distress, Morbid obesity Heart: RRR. No JVD.  Lungs: Clear to auscultation, breathing unlabored Abdomen: Positive bowel sounds, soft  Musculoskeletal: No edema. No tenderness Neurologic: Alert Motor 5/5 in bilateral deltoid, bicep, tricep, grip,4+/5 hip flexor, knee extensors, ankle dorsiflexor and plantar flexor Minimal LUE ataxia Skin: No evidence of breakdown, no evidence of rash Psych: Mood and affect are appropriate   Assessment/Plan: 1. Functional deficits secondary to Left cerebellar infarct which require 3+ hours per day of interdisciplinary therapy in a comprehensive inpatient rehab setting. Physiatrist is providing close team supervision and 24 hour management of active medical problems listed below. Physiatrist and rehab team continue to assess barriers to discharge/monitor patient progress toward functional and medical goals. FIM: Function - Bathing Position: Shower (seated on BSC) Body parts bathed by patient: Right arm, Left arm, Chest, Abdomen, Front perineal area, Left upper leg, Right upper leg, Right lower leg, Left lower leg, Back Body parts bathed by helper: Buttocks Bathing not applicable: Buttocks Assist Level: Touching or steadying assistance(Pt > 75%) Assistive Device Comment: with long handled sponge  Function- Upper Body Dressing/Undressing What is the patient wearing?: Pull over shirt/dress Pull over shirt/dress - Perfomed by patient: Thread/unthread right  sleeve, Thread/unthread left sleeve, Put head through opening, Pull shirt over trunk Assist Level: Set up Set up : To obtain clothing/put away Function - Lower Body Dressing/Undressing What is the patient wearing?: Underwear, Pants, Socks, Shoes Position: Wheelchair/chair at sink Underwear - Performed by patient: Thread/unthread right underwear leg, Thread/unthread left underwear leg Underwear - Performed by helper: Pull underwear up/down Pants- Performed by patient: Thread/unthread right pants leg, Thread/unthread left pants leg Pants- Performed by helper: Pull pants up/down Non-skid slipper socks- Performed by patient: Don/doff right sock, Don/doff left sock Non-skid slipper socks- Performed by helper: Don/doff right sock, Don/doff left sock Socks - Performed by patient: Don/doff right sock, Don/doff left sock Shoes - Performed by patient: Don/doff right shoe, Don/doff left shoe Shoes - Performed by helper: Fasten right, Fasten left Assist for footwear: Supervision/touching assist, Setup Assist for lower body dressing: Touching or steadying assistance (Pt > 75%) Assistive Device Comment: with use of shoe funnel  Function - Toileting Toileting steps completed by patient: Performs perineal hygiene, Adjust clothing prior to toileting, Adjust clothing after toileting Toileting steps completed by helper: Adjust clothing after toileting Toileting Assistive Devices: Grab bar or rail Assist level: Two helpers (stedy)  Function - ArchivistToilet Transfers Toilet transfer assistive device: Elevated toilet seat/BSC over toilet, Grab bar Mechanical lift: Stedy Assist level to toilet: Touching or steadying assistance (Pt > 75%) Assist level from toilet: Touching or steadying assistance (Pt > 75%)  Function - Chair/bed transfer Chair/bed transfer method: Squat pivot Chair/bed transfer assist level: Touching or steadying assistance (Pt > 75%) Chair/bed transfer assistive device: Mechanical  lift Mechanical lift: Stedy Chair/bed transfer details: Manual facilitation for placement, Manual facilitation for weight shifting, Verbal cues for technique, Verbal cues for sequencing, Verbal cues for precautions/safety, Verbal cues  for safe use of DME/AE  Function - Locomotion: Wheelchair Will patient use wheelchair at discharge?: Yes Type: Manual Max wheelchair distance: 50 ft Assist Level: Supervision or verbal cues Assist Level: Supervision or verbal cues Wheel 150 feet activity did not occur: Safety/medical concerns Assist Level: Supervision or verbal cues Turns around,maneuvers to table,bed, and toilet,negotiates 3% grade,maneuvers on rugs and over doorsills: No Function - Locomotion: Ambulation Assistive device: Walker-rolling Max distance: 12 ft Assist level: 2 helpers Assist level: 2 helpers Walk 50 feet with 2 turns activity did not occur: Safety/medical concerns Walk 150 feet activity did not occur: Safety/medical concerns Walk 10 feet on uneven surfaces activity did not occur: Safety/medical concerns  Function - Comprehension Comprehension: Auditory Comprehension assist level: Follows complex conversation/direction with no assist  Function - Expression Expression: Verbal Expression assist level: Expresses complex ideas: With no assist  Function - Social Interaction Social Interaction assist level: Interacts appropriately with others - No medications needed.  Function - Problem Solving Problem solving assist level: Solves complex problems: Recognizes & self-corrects  Function - Memory Memory assist level: Complete Independence: No helper Patient normally able to recall (first 3 days only): Current season, Location of own room, Staff names and faces, That he or she is in a hospital  Medical Problem List and Plan: 1. Ataxia and functional deficitssecondary to left cerebellar infarct  -Cont CIR  2. DVT Prophylaxis/Anticoagulation: Pharmaceutical:  Lovenox 3. Pain Management: N/A 4. Mood: Team to provide ego support. LCSW to follow for evaluation and support. Anxiety on Buspar and citalopram, gets dizzy with Buspar changed to PRN 5. Neuropsych: This patient iscapable of making decisions on herown behalf. Dr Kieth Brightly following for relaxation training 6. Skin/Wound Care: Routine pressure relief measures.  7. Fluids/Electrolytes/Nutrition: Monitor I/O.   BMP within acceptable range on 2/9  Encourage fluids  Requests qHS snack 8. HTN: Monitor BP bid.   Resumed Lasix 2/9  Metoprolol 12.5 restarted 2/10 (home 50)  Improved 2/12 9. OSA: Continue to encourage consistent use of CPAP 10. Morbid obesity: BMI 55.87. consult dietician to educate on CM/Heart healthy diet. Encourage appropriate diet and exercise to help promote weight loss to help promote health  12. Prediabetes: Hgb A1C-6.3. CM diet reinforced. Monitor BS ac/hs for trends.   Slightly elevated, but overall controlled 2/8 13. CAD s/p CABG: On ASA and Lipitor  14.  Constipation improved on senna S BID may reduce if BMs become excessive  LOS (Days) 10 A FACE TO FACE EVALUATION WAS PERFORMED  Ankit Karis Juba 03/20/2016, 10:11 AM

## 2016-03-20 NOTE — Progress Notes (Signed)
Patient placed on CPAP. Patient setting is 8 cmH2O. Sterile water added to chamber for humidification.Patient tolerating well.

## 2016-03-20 NOTE — Progress Notes (Signed)
Nutrition Brief Note  RD consulted to order HS snack as pt has been hungry. RD to order. Food preferences have been discussed. Meal completion has been 100%. Appetite has been good. No further nutrition interventions at this time. Re-consult as needed.   Roslyn SmilingStephanie Vernon Maish, MS, RD, LDN Pager # [phone] After hours/ weekend pager # [phone]

## 2016-03-20 NOTE — Progress Notes (Signed)
Occupational Therapy Weekly Progress Note  Patient Details  Name: Megan Atkinson MRN: [id number] Date of Birth: 12/24/1950  Beginning of progress report period: March 11, 2016 End of progress report period: March 20, 2016  Today's Date: 03/20/2016 OT Individual Time: 8832-5498 OT Individual Time Calculation (min): 60 min    Patient has met 4 of 4 short term goals.  Pt is making slow progress towards goals.  Pt currently requires min-mod assist for transfers at w/c level.  Pt's progress to advance towards LRAD is impacted by her increased anxiety and fearfulness of falling.  Pt often limiting her participation in treatment sessions due to fear of falling secondary to body habitus.  Pt is progressing with repetition of sit > stand with decreasing UE support, still requiring increased encouragement for mobility.  Pt continues to be very reliant on UE support during transfers and self-care tasks.    Patient continues to demonstrate the following deficits: muscle weakness, decreased cardiorespiratoy endurance, unbalanced muscle activation and ataxia, decreased problem solving and decreased standing balance, decreased postural control and decreased balance strategies and therefore will continue to benefit from skilled OT intervention to enhance overall performance with BADL and Reduce care partner burden.  Patient progressing toward long term goals..  Continue plan of care.  OT Short Term Goals Week 1:  OT Short Term Goal 1 (Week 1): Pt will complete toilet transfer with LRAD and Max A OT Short Term Goal 1 - Progress (Week 1): Met OT Short Term Goal 2 (Week 1): Pt will don socks with use of sock aide and supervision  OT Short Term Goal 2 - Progress (Week 1): Met OT Short Term Goal 3 (Week 1): Pt will complete sit<stand for LB dressing with Max A at sink or with RW OT Short Term Goal 3 - Progress (Week 1): Met OT Short Term Goal 4 (Week 1): Pt will complete bathing with Min A with AE as  needed  OT Short Term Goal 4 - Progress (Week 1): Met Week 2:  OT Short Term Goal 1 (Week 2): Pt will complete toilet transfers with supervision with RW OT Short Term Goal 2 (Week 2): Pt will complete LB dressing at sit > stand level with supervision/setup OT Short Term Goal 3 (Week 2): Pt will complete 2 grooming tasks in standing to increase standing tolerance/balance  Skilled Therapeutic Interventions/Progress Updates:    Treatment session with focus on ADL retraining with functional transfers, dynamic standing balance, and increased activity tolerance during bathing and dressing tasks.  Pt received in bed expressing desire to bathe at shower level.  Completed squat pivot transfer bed > w/c with min assist and mod multimodal cue for sequencing, weight shifting to Rt, and decreased anxiety.  Stand pivot transfer 180* w/c onto BSC in room shower to Rt with min assist and mod assist when exiting due to transferring to Lt and increased anxiety going to Lt.  Bathing completed at sit > stand level with pt utilizing long handled sponge for bathing and therapist assisting with buttocks while pt maintained standing with UE support on Rt grab bar.  Completed dressing at sit > stand level with sink to pt's Rt to promote weight shift to Rt when standing to decrease pushing tendencies.  Therapist positioned infront of pt and to her Lt to provide emotional as well as physical support.  Pt able to reach behind self with Lt hand to pull up underwear and pants while maintaining RUE support on sink, increased difficulty when releasing  UE support from sink to pull up pants on Rt - increasing pushing to Lt.  Utilized shoe funnel for donning shoes with setup.  Grooming tasks completed in sitting with setup assist.  Therapy Documentation Precautions:  Precautions Precautions: Fall Precaution Comments:  (Left hemiparesis, vertigo ) Restrictions Weight Bearing Restrictions: No General:   Vital Signs: Therapy  Vitals Temp: 98.4 F (36.9 C) Temp Source: Oral Pulse Rate: 69 Resp: 18 BP: (!) 122/55 Patient Position (if appropriate): Lying Oxygen Therapy SpO2: 97 % O2 Device: Not Delivered Pain:  Pt with no c/o pain  See Function Navigator for Current Functional Status.   Therapy/Group: Individual Therapy  Simonne Come 03/20/2016, 7:27 AM

## 2016-03-20 NOTE — Progress Notes (Signed)
Physical Therapy Session Note  Patient Details  Name: Megan Atkinson MRN: [id number] Date of Birth: 08/14/1950  Today's Date: 03/20/2016 PT Individual Time: 4098-1191 PT Individual Time Calculation (min): 58 min   Short Term Goals: Week 2:  PT Short Term Goal 1 (Week 2): Pt will transfer with RW and min assist PT Short Term Goal 2 (Week 2): Pt will ambulate 43' with RW and mod assist PT Short Term Goal 3 (Week 2): Pt will negotiate 4 steps with 2 rails for strengthening.   Skilled Therapeutic Interventions/Progress Updates:    no c/o pain, session focus on activity tolerance, strengthening, standing tolerance, and transfers.   Pt propelled w/c x70' with RLE and intermittent use of BUEs with 1 rest break.  Seated UE therex focus on strengthening and core activation in unsupported sitting in w/c with 1kg ball for shoulder flexion and trunk rotation R/L.  Seated balance focus on core activation with ball taps using 2# dowel rod, pt able to tolerate activity in seated position x15 minutes, and standing x3 minutes.  Pt transitioned sit<>stand from w/c with armrests to push up with mod assist.  NMR and LE strengthening on kinetron at 20 cm/s 4 trials of 1 minute each.  Pt returned to room at end of session and positioned with call bell in reach and needs met.   Therapy Documentation Precautions:  Precautions Precautions: Fall Precaution Comments:  (Left hemiparesis, vertigo ) Restrictions Weight Bearing Restrictions: No   See Function Navigator for Current Functional Status.   Therapy/Group: Individual Therapy  Earnest Conroy Penven-Crew 03/20/2016, 1:58 PM

## 2016-03-21 ENCOUNTER — Inpatient Hospital Stay (HOSPITAL_COMMUNITY): Payer: Medicare Other | Admitting: Physical Therapy

## 2016-03-21 ENCOUNTER — Inpatient Hospital Stay (HOSPITAL_COMMUNITY): Payer: Medicare Other | Admitting: Occupational Therapy

## 2016-03-21 LAB — GLUCOSE, CAPILLARY
GLUCOSE-CAPILLARY: 91 mg/dL (ref 65–99)
GLUCOSE-CAPILLARY: 100 mg/dL — AB (ref 65–99)
Glucose-Capillary: 101 mg/dL — ABNORMAL HIGH (ref 65–99)
Glucose-Capillary: 102 mg/dL — ABNORMAL HIGH (ref 65–99)

## 2016-03-21 NOTE — Progress Notes (Signed)
Subjective/Complaints: Pt seen laying in bed this AM.  She states she is ready to go home, but is aware she has some time before her discharge date.  She slept well overnight.  She states she did receive a snack overnight, but did not receiver her meal.    ROS: Denies CP, SOB, N/V/D  Objective: Vital Signs: Blood pressure (!) 126/52, pulse 75, temperature 98.4 F (36.9 C), temperature source Oral, resp. rate 18, height 5\' 7"  (1.702 m), weight (!) 169.8 kg (374 lb 6.4 oz), SpO2 98 %. No results found. Results for orders placed or performed during the hospital encounter of 03/10/16 (from the past 72 hour(s))  Glucose, capillary     Status: None   Collection Time: 03/18/16 11:58 AM  Result Value Ref Range   Glucose-Capillary 93 65 - 99 mg/dL  Glucose, capillary     Status: Abnormal   Collection Time: 03/18/16  4:57 PM  Result Value Ref Range   Glucose-Capillary 120 (H) 65 - 99 mg/dL  Glucose, capillary     Status: Abnormal   Collection Time: 03/18/16  9:00 PM  Result Value Ref Range   Glucose-Capillary 123 (H) 65 - 99 mg/dL  Glucose, capillary     Status: None   Collection Time: 03/19/16  6:44 AM  Result Value Ref Range   Glucose-Capillary 87 65 - 99 mg/dL  Glucose, capillary     Status: None   Collection Time: 03/19/16 11:40 AM  Result Value Ref Range   Glucose-Capillary 97 65 - 99 mg/dL  Glucose, capillary     Status: Abnormal   Collection Time: 03/19/16  4:29 PM  Result Value Ref Range   Glucose-Capillary 100 (H) 65 - 99 mg/dL  Glucose, capillary     Status: Abnormal   Collection Time: 03/19/16  9:28 PM  Result Value Ref Range   Glucose-Capillary 100 (H) 65 - 99 mg/dL   Comment 1 Notify RN   Glucose, capillary     Status: Abnormal   Collection Time: 03/20/16  7:04 AM  Result Value Ref Range   Glucose-Capillary 104 (H) 65 - 99 mg/dL   Comment 1 Notify RN   Glucose, capillary     Status: None   Collection Time: 03/20/16 12:06 PM  Result Value Ref Range   Glucose-Capillary 87 65 - 99 mg/dL  Glucose, capillary     Status: None   Collection Time: 03/20/16  5:03 PM  Result Value Ref Range   Glucose-Capillary 91 65 - 99 mg/dL  Glucose, capillary     Status: Abnormal   Collection Time: 03/20/16  8:18 PM  Result Value Ref Range   Glucose-Capillary 100 (H) 65 - 99 mg/dL  Glucose, capillary     Status: Abnormal   Collection Time: 03/21/16  6:54 AM  Result Value Ref Range   Glucose-Capillary 101 (H) 65 - 99 mg/dL     General: NAD. Vital signs reviewed. Morbid obesity Heart: RRR. No JVD.  Lungs: Clear to auscultation, breathing unlabored Abdomen: Positive bowel sounds, soft  Musculoskeletal: No edema. No tenderness Neurologic: Alert Motor 5/5 in bilateral deltoid, bicep, tricep, grip,4+/5 hip flexor, knee extensors, ankle dorsiflexor and plantar flexor Minimal LUE ataxia (stable) Skin: No evidence of breakdown, no evidence of rash Psych: Mood and affect are appropriate   Assessment/Plan: 1. Functional deficits secondary to Left cerebellar infarct which require 3+ hours per day of interdisciplinary therapy in a comprehensive inpatient rehab setting. Physiatrist is providing close team supervision and 24 hour management of active  medical problems listed below. Physiatrist and rehab team continue to assess barriers to discharge/monitor patient progress toward functional and medical goals. FIM: Function - Bathing Position: Wheelchair/chair at sink Body parts bathed by patient: Right arm, Left arm, Chest, Abdomen, Front perineal area, Left upper leg, Right upper leg, Buttocks Body parts bathed by helper: Buttocks Bathing not applicable: Right lower leg, Left lower leg, Back Assist Level: Supervision or verbal cues, Set up Assistive Device Comment: with long handled sponge Set up : To obtain items  Function- Upper Body Dressing/Undressing What is the patient wearing?: Pull over shirt/dress Pull over shirt/dress - Perfomed by patient:  Thread/unthread right sleeve, Thread/unthread left sleeve, Put head through opening, Pull shirt over trunk Assist Level: Set up Set up : To obtain clothing/put away Function - Lower Body Dressing/Undressing What is the patient wearing?: Underwear, Pants, Socks, Shoes Position: Wheelchair/chair at sink Underwear - Performed by patient: Thread/unthread right underwear leg, Thread/unthread left underwear leg, Pull underwear up/down Underwear - Performed by helper: Pull underwear up/down Pants- Performed by patient: Thread/unthread right pants leg, Thread/unthread left pants leg, Pull pants up/down Pants- Performed by helper: Pull pants up/down Non-skid slipper socks- Performed by patient: Don/doff right sock, Don/doff left sock Non-skid slipper socks- Performed by helper: Don/doff right sock, Don/doff left sock Socks - Performed by patient: Don/doff right sock, Don/doff left sock Shoes - Performed by patient: Don/doff right shoe, Don/doff left shoe Shoes - Performed by helper: Fasten right, Fasten left Assist for footwear: Supervision/touching assist, Setup Assist for lower body dressing: Set up, Supervision or verbal cues Assistive Device Comment: with use of shoe funnel Set up : To obtain clothing/put away  Function - Toileting Toileting steps completed by patient: Performs perineal hygiene, Adjust clothing prior to toileting, Adjust clothing after toileting Toileting steps completed by helper: Adjust clothing after toileting Toileting Assistive Devices: Grab bar or rail Assist level: Two helpers (stedy)  Function - Archivist transfer assistive device: Elevated toilet seat/BSC over toilet, Grab bar Mechanical lift: Stedy Assist level to toilet: Touching or steadying assistance (Pt > 75%) Assist level from toilet: Touching or steadying assistance (Pt > 75%)  Function - Chair/bed transfer Chair/bed transfer method: Squat pivot Chair/bed transfer assist level: Touching or  steadying assistance (Pt > 75%) Chair/bed transfer assistive device: Mechanical lift Mechanical lift: Stedy Chair/bed transfer details: Manual facilitation for placement, Manual facilitation for weight shifting, Verbal cues for technique, Verbal cues for sequencing, Verbal cues for precautions/safety, Verbal cues for safe use of DME/AE  Function - Locomotion: Wheelchair Will patient use wheelchair at discharge?: Yes Type: Manual Max wheelchair distance: 50 ft Assist Level: Supervision or verbal cues Assist Level: Supervision or verbal cues Wheel 150 feet activity did not occur: Safety/medical concerns Assist Level: Supervision or verbal cues Turns around,maneuvers to table,bed, and toilet,negotiates 3% grade,maneuvers on rugs and over doorsills: No Function - Locomotion: Ambulation Assistive device: Walker-rolling Max distance: 20 Assist level: 2 helpers (min>max for gait, +2 for w/c follow) Assist level: 2 helpers Walk 50 feet with 2 turns activity did not occur: Safety/medical concerns Walk 150 feet activity did not occur: Safety/medical concerns Walk 10 feet on uneven surfaces activity did not occur: Safety/medical concerns  Function - Comprehension Comprehension: Auditory Comprehension assist level: Follows complex conversation/direction with no assist  Function - Expression Expression: Verbal Expression assist level: Expresses complex 90% of the time/cues < 10% of the time  Function - Social Interaction Social Interaction assist level: Interacts appropriately with others - No medications needed.  Function - Problem Solving Problem solving assist level: Solves basic problems with no assist  Function - Memory Memory assist level: Requires cues to use assistive device Patient normally able to recall (first 3 days only): Current season, Location of own room, Staff names and faces, That he or she is in a hospital  Medical Problem List and Plan: 1. Ataxia and functional  deficitssecondary to left cerebellar infarct  -Cont CIR  2. DVT Prophylaxis/Anticoagulation: Pharmaceutical: Lovenox 3. Pain Management: N/A 4. Mood: Team to provide ego support. LCSW to follow for evaluation and support. Anxiety on Buspar and citalopram, gets dizzy with Buspar changed to PRN 5. Neuropsych: This patient iscapable of making decisions on herown behalf. Dr Kieth Brightlyodenbough following for relaxation training 6. Skin/Wound Care: Routine pressure relief measures.  7. Fluids/Electrolytes/Nutrition: Monitor I/O.   BMP within acceptable range on 2/9  Encourage fluids  Requests qHS snack 8. HTN: Monitor BP bid.   Resumed Lasix 2/9  Metoprolol 12.5 restarted 2/10 (home 50)  Improved 2/13 9. OSA: Continue to encourage consistent use of CPAP 10. Morbid obesity: BMI 55.87. consult dietician to educate on CM/Heart healthy diet. Encourage appropriate diet and exercise to help promote weight loss to help promote health  12. Prediabetes: Hgb A1C-6.3. CM diet reinforced. Monitor BS ac/hs for trends.   Overall controlled 2/13 13. CAD s/p CABG: On ASA and Lipitor  14.  Constipation improved on senna S BID may reduce if BMs become excessive  LOS (Days) 11 A FACE TO FACE EVALUATION WAS PERFORMED  Megan Atkinson Megan Atkinson Ewald Beg 03/21/2016, 10:57 AM

## 2016-03-21 NOTE — Progress Notes (Signed)
Physical Therapy Session Note  Patient Details  Name: Megan Atkinson MRN: [id number] Date of Birth: 1950-07-05  Today's Date: 03/21/2016 PT Individual Time: 1545-1630 PT Individual Time Calculation (min): 45 min   Short Term Goals: Week 2:  PT Short Term Goal 1 (Week 2): Pt will transfer with RW and min assist PT Short Term Goal 2 (Week 2): Pt will ambulate 58' with RW and mod assist PT Short Term Goal 3 (Week 2): Pt will negotiate 4 steps with 2 rails for strengthening.   Skilled Therapeutic Interventions/Progress Updates:    no c/o pain.  Session focus on transfers and gait training.    Stand/pivot with RW from w/c>therapy mat towards R side with mod/max assist and multimodal cues for sequencing, posture, and midline orientation.  Gait training with maxisky in // bars focus on R weight shift, step length, midline orientation, and upright posture.  Pt able to complete 2 full lengths of // bars (down and back) 2x with rest break in between trials.  Pt continues to require max multimodal cues for weight shift and midline orientation even with mirror for visual feedback.  Pt returned to room at end of session and positioned in recliner (mod assist for 180 degree squat/pivot from w/c), call bell in reach and needs met.   Therapy Documentation Precautions:  Precautions Precautions: Fall Precaution Comments:  (Left hemiparesis, vertigo ) Restrictions Weight Bearing Restrictions: No   See Function Navigator for Current Functional Status.   Therapy/Group: Individual Therapy  Earnest Conroy Penven-Crew 03/21/2016, 4:53 PM

## 2016-03-21 NOTE — Progress Notes (Signed)
Physical Therapy Session Note  Patient Details  Name: Megan Atkinson MRN: [id number] Date of Birth: 1950-08-28  Today's Date: 03/21/2016 PT Individual Time: 1005-1100 PT Individual Time Calculation (min): 55 min   Short Term Goals: Week 2:  PT Short Term Goal 1 (Week 2): Pt will transfer with RW and min assist PT Short Term Goal 2 (Week 2): Pt will ambulate 32' with RW and mod assist PT Short Term Goal 3 (Week 2): Pt will negotiate 4 steps with 2 rails for strengthening.   Skilled Therapeutic Interventions/Progress Updates:    no c/o pain.  Session focus on NMR for R weight shift, transfers, gait training, and w/c propulsion.    Gait training x10' with max assist and +2 for w/c follow.  Pt demos strong L lean and consistently shifts both feet further and further to the R despite max multimodal cues to correct.  Transition to NMR for weightshift R/forward for carry over into gait with initial total facilitation fade to supervision.  Progress to weight shifting forward/R and stepping L foot forward and back, pt requiring mod/max assist to facilitate weight shift for sufficient LLE clearance.  Pt educated extensively on BOS, walker positioning, upright posture and midline orientation, R weight shift for L foot clearance, and step length and verbalized understanding, however does not carry information over into gait training despite continued cues.  Gait training x30' with max to total assist to maintain midline orientation and for R weight shift with max multimodal cues for sequencing and weight shift.  +2 for w/c follow for safety.  W/C propulsion x125' with BUEs and RLE for mobility and activity tolerance with several short rest breaks 2/2 fatigue.  When discussing extending LOS to allow for pt to achieve goals pt states she would not agree to stay past expected d/c date of 2/20.  Will continue to discuss d/c plans and needs.  Pt left upright in w/c at end of session with call bell in reach and needs  met.   Therapy Documentation Precautions:  Precautions Precautions: Fall Precaution Comments:  (Left hemiparesis, vertigo ) Restrictions Weight Bearing Restrictions: No   See Function Navigator for Current Functional Status.   Therapy/Group: Individual Therapy  Lynton Crescenzo E Penven-Crew 03/21/2016, 11:08 AM

## 2016-03-21 NOTE — Progress Notes (Signed)
Social Work Patient ID: Megan Atkinson, female   DOB: 1950/11/06, 66 y.o.   MRN: [id number] Met with pt to discuss the team's concerns and the fact she is not going to be mod/i by discharge. Her youngest son-the architech will be here this weekend and will see in therapies and be able to measure her door ways at home and get back with team. Her oldest son-the surgeon will be Here Tuesday to transport her home, so will need to do some family education on day of discharge-car transfers and getting into the home. Pt feels she can call many folks to assist her which is not Realistic every time she needs to move at home. Pt states: " You all worry too much, I will be fine, it will all work out."  If her husband will be assisting her he will need to come in also prior to discharge. Will continue to work on a realistic discharge plan, have stated to pt she will not be walking independently at discharge from here.

## 2016-03-21 NOTE — Progress Notes (Signed)
Occupational Therapy Session Note  Patient Details  Name: Megan NielsenBeverley Matzen MRN: [id number] Date of Birth: Sep 29, 1950  Today's Date: 03/21/2016 OT Individual Time: [id number] and [id number] OT Individual Time Calculation (min): 57 min and 42 min   Short Term Goals: Week 2:  OT Short Term Goal 1 (Week 2): Pt will complete toilet transfers with supervision with RW OT Short Term Goal 2 (Week 2): Pt will complete LB dressing at sit > stand level with supervision/setup OT Short Term Goal 3 (Week 2): Pt will complete 2 grooming tasks in standing to increase standing tolerance/balance  Skilled Therapeutic Interventions/Progress Updates:    1) Treatment session with focus on ADL retraining at sit > stand level with focus on dynamic standing balance.  Pt received in bed, completed squat pivot transfer to Rt to w/c with min guard assist and encouragement.  Pt completed bathing and dressing at overall setup level at sink.  Pt demonstrating improved recall of hand positioning and completing sit > stand with RUE on sink with supervision, able to wash buttocks and pull up underwear and pants this session with alternating UE support on sink.  Engaged in discussion of home accessibility and [facility] as well as washing at kitchenette sink vs bathroom.    Increased cognitive challenge to include d/c planning, pt demonstrating decreased emergent and anticipatory awareness regarding physical ability upon d/c.  Pt reports she will ambulate with RW in/out of bathroom, complete laundry tasks, and prepare meals in standing.  Engaged in discussion regarding increased focus on standing, ambulation, and transfers to increase ability to engage in more functional tasks.  Also introduced possibility of need for w/c at home, if home will accommodate size of w/c.  Plan to provide home measurement checklist to pt's son to further assess home environment for w/c.  2) Treatment session with focus on functional transfers  with squat and stand pivot transfers.  Engaged in further discussion regarding recommendation for 24/7 supervision, with pt reporting she will be fine at home and will call friends for assistance as she needs.  Completed squat pivot transfers w/c > bed > BSC to Rt and to Lt with focus on anterior weight shift and lift off of buttocks to decrease pushing backwards through legs during transitional movements.  Min-mod assist with squat pivot transfers due to posterior lean and tendency to push towards Lt.  Transitioned to sit > [facility] from [facility] with focus on anterior weight shift and hand placement with pushing up from arm rests.  Utilized RW placed in front for UE support once upright to alternate UE support to simulate pulling down pants for toileting and hygiene.  Completed stand step transfers with RW to Rt and Lt with min assist progressing to mod due to fatigue.  Pt continues to minimize fall concerns, despite anxiety with movement, stating she will be fine at home.    Therapy Documentation Precautions:  Precautions Precautions: Fall Precaution Comments:  (Left hemiparesis, vertigo ) Restrictions Weight Bearing Restrictions: No Pain: Pt with no c/o pain ADL: ADL ADL Comments: Please see functional navigator for ADL status  See Function Navigator for Current Functional Status.   Therapy/Group: Individual Therapy  Rosalio LoudHOXIE, Blake Vetrano 03/21/2016, 9:08 AM

## 2016-03-22 ENCOUNTER — Inpatient Hospital Stay (HOSPITAL_COMMUNITY): Payer: Medicare Other | Admitting: Occupational Therapy

## 2016-03-22 ENCOUNTER — Ambulatory Visit (HOSPITAL_COMMUNITY): Payer: Medicare Other | Admitting: Psychology

## 2016-03-22 ENCOUNTER — Inpatient Hospital Stay (HOSPITAL_COMMUNITY): Payer: Medicare Other | Admitting: *Deleted

## 2016-03-22 ENCOUNTER — Inpatient Hospital Stay (HOSPITAL_COMMUNITY): Payer: Medicare Other

## 2016-03-22 ENCOUNTER — Inpatient Hospital Stay (HOSPITAL_COMMUNITY): Payer: Medicare Other | Admitting: Physical Therapy

## 2016-03-22 DIAGNOSIS — I639 Cerebral infarction, unspecified: Secondary | ICD-10-CM

## 2016-03-22 DIAGNOSIS — F411 Generalized anxiety disorder: Secondary | ICD-10-CM

## 2016-03-22 LAB — GLUCOSE, CAPILLARY
GLUCOSE-CAPILLARY: 81 mg/dL (ref 65–99)
GLUCOSE-CAPILLARY: 124 mg/dL — AB (ref 65–99)
GLUCOSE-CAPILLARY: 113 mg/dL — AB (ref 65–99)
Glucose-Capillary: 62 mg/dL — ABNORMAL LOW (ref 65–99)
Glucose-Capillary: 107 mg/dL — ABNORMAL HIGH (ref 65–99)

## 2016-03-22 NOTE — Plan of Care (Signed)
Problem: RH Bed to Chair Transfers Goal: LTG Patient will perform bed/chair transfers w/assist (PT) LTG: Patient will perform bed/chair transfers with assistance, with/without cues (PT).  Downgraded 2/14 due to slow progress and decreased safety awareness  Problem: RH Car Transfers Goal: LTG Patient will perform car transfers with assist (PT) LTG: Patient will perform car transfers with assistance (PT).  Downgraded 2/14 due to slow progress and decreased safety awareness  Problem: RH Furniture Transfers Goal: LTG Patient will perform furniture transfers w/assist (OT/PT LTG: Patient will perform furniture transfers  with assistance (OT/PT).  Downgraded 2/14 due to slow progress and decreased safety awareness  Problem: RH Ambulation Goal: LTG Patient will ambulate in controlled environment (PT) LTG: Patient will ambulate in a controlled environment, # of feet with assistance (PT).  Downgraded 2/14 due to slow progress and decreased safety awareness Goal: LTG Patient will ambulate in home environment (PT) LTG: Patient will ambulate in home environment, # of feet with assistance (PT).  Downgraded 2/14 due to slow progress and decreased safety awareness

## 2016-03-22 NOTE — Progress Notes (Signed)
Patient:  Megan NielsenBeverley Atkinson   DOB: 31-Aug-1950  MR Number: [id number]  [facility] [address] [id number] [place] [id number] Dept: [phone] Loc: [phone]  Start: 4 PM End: 4:50 PM  Provider/Observer:     Hershal CoriaJohn R Rodenbough PSYD  Chief Complaint:      Chief Complaint  Patient presents with  . Anxiety  . Stress    Reason For Service:     The patient was Referred for a neuropsychological/psychological consultation. There concerns about anxiety particularly around issues of doing her physical therapy to work on her walking and gait. The patient was observed getting very jittery and having some trembling when she was doing her physical therapy. The request to address issues of possible anxiety and stress particularly pertaining to whether or not it was having a negative impact on her ability to benefit maximally from physical therapy.  Interventions Strategy:  Psychological and psychological interventions including building coping skills and adaptive abilities for issues related to her anxiety secondary to her stroke.  Participation Level:   Active  Participation Quality:  Appropriate      Behavioral Observation:  Well Groomed, Alert, and Appropriate.   Current Psychosocial Factors: Patient reports that her family is getting everything ready in the house and she is looking forward to her discharge on the 20th. The patient reports that she still has a lot of anxiety around what is going to be like when she goes home and will she be able to do all the things that she needs to. However, she reports that she has been putting some of the strategies we've talked about during our first visit to use and working on trying to manage her anxiety to a better extent.  Content of Session:   Reviewed current symptoms and situation and worked on furthering her coping strategies and details.  Current  Status:   The patient reports that she does continue to have a lot of anxiety around falling but that she has been actively working on her physical therapy strategies to deal with her tendency to lean to the left when she was standing.  Patient Progress:   Improving   Impression/Diagnosis:   The patient does acknowledge some anxeity and apprehension about fear of falling.  However, she reports that as she works more and more her comfort level is getting better.  Do not think underlying anxiety disorder.   Diagnosis:   Cerebellar stroke, acute (HCC)  Generalized anxiety disorder

## 2016-03-22 NOTE — Progress Notes (Signed)
Subjective/Complaints: Pt seen sitting up in bed this AM.  She still wants to go home.  She has questions about low CBGs and DM meds.   ROS: Denies CP, SOB, N/V/D  Objective: Vital Signs: Blood pressure (!) 120/53, pulse 72, temperature 98.2 F (36.8 C), temperature source Oral, resp. rate 20, height 5\' 7"  (1.702 m), weight (!) 169.8 kg (374 lb 6.4 oz), SpO2 96 %. No results found. Results for orders placed or performed during the hospital encounter of 03/10/16 (from the past 72 hour(s))  Glucose, capillary     Status: None   Collection Time: 03/19/16 11:40 AM  Result Value Ref Range   Glucose-Capillary 97 65 - 99 mg/dL  Glucose, capillary     Status: Abnormal   Collection Time: 03/19/16  4:29 PM  Result Value Ref Range   Glucose-Capillary 100 (H) 65 - 99 mg/dL  Glucose, capillary     Status: Abnormal   Collection Time: 03/19/16  9:28 PM  Result Value Ref Range   Glucose-Capillary 100 (H) 65 - 99 mg/dL   Comment 1 Notify RN   Glucose, capillary     Status: Abnormal   Collection Time: 03/20/16  7:04 AM  Result Value Ref Range   Glucose-Capillary 104 (H) 65 - 99 mg/dL   Comment 1 Notify RN   Glucose, capillary     Status: None   Collection Time: 03/20/16 12:06 PM  Result Value Ref Range   Glucose-Capillary 87 65 - 99 mg/dL  Glucose, capillary     Status: None   Collection Time: 03/20/16  5:03 PM  Result Value Ref Range   Glucose-Capillary 91 65 - 99 mg/dL  Glucose, capillary     Status: Abnormal   Collection Time: 03/20/16  8:18 PM  Result Value Ref Range   Glucose-Capillary 100 (H) 65 - 99 mg/dL  Glucose, capillary     Status: Abnormal   Collection Time: 03/21/16  6:54 AM  Result Value Ref Range   Glucose-Capillary 101 (H) 65 - 99 mg/dL  Glucose, capillary     Status: Abnormal   Collection Time: 03/21/16 11:32 AM  Result Value Ref Range   Glucose-Capillary 102 (H) 65 - 99 mg/dL  Glucose, capillary     Status: Abnormal   Collection Time: 03/21/16  5:02 PM  Result  Value Ref Range   Glucose-Capillary 62 (L) 65 - 99 mg/dL  Glucose, capillary     Status: None   Collection Time: 03/21/16  5:31 PM  Result Value Ref Range   Glucose-Capillary 81 65 - 99 mg/dL   Comment 1 Notify RN   Glucose, capillary     Status: Abnormal   Collection Time: 03/21/16  8:58 PM  Result Value Ref Range   Glucose-Capillary 124 (H) 65 - 99 mg/dL  Glucose, capillary     Status: Abnormal   Collection Time: 03/22/16  6:31 AM  Result Value Ref Range   Glucose-Capillary 107 (H) 65 - 99 mg/dL     General: NAD. Vital signs reviewed. Morbid obesity Heart: RRR. No JVD.  Lungs: Clear to auscultation, breathing unlabored Abdomen: Positive bowel sounds, soft  Musculoskeletal: No edema. No tenderness Neurologic: Alert Motor 5/5 in bilateral deltoid, bicep, tricep, grip,4+/5 hip flexor, knee extensors, ankle dorsiflexor and plantar flexor Minimal LUE ataxia (stable) Skin: No evidence of breakdown, no evidence of rash Psych: Mood and affect are appropriate   Assessment/Plan: 1. Functional deficits secondary to Left cerebellar infarct which require 3+ hours per day of interdisciplinary therapy in  a comprehensive inpatient rehab setting. Physiatrist is providing close team supervision and 24 hour management of active medical problems listed below. Physiatrist and rehab team continue to assess barriers to discharge/monitor patient progress toward functional and medical goals. FIM: Function - Bathing Position: Wheelchair/chair at sink Body parts bathed by patient: Right arm, Left arm, Chest, Abdomen, Right upper leg, Left upper leg Body parts bathed by helper: Buttocks Bathing not applicable: Front perineal area, Buttocks, Right lower leg, Left lower leg, Back Assist Level: Supervision or verbal cues, Set up Assistive Device Comment: with long handled sponge Set up : To obtain items  Function- Upper Body Dressing/Undressing What is the patient wearing?: Pull over shirt/dress Pull  over shirt/dress - Perfomed by patient: Thread/unthread right sleeve, Thread/unthread left sleeve, Put head through opening, Pull shirt over trunk Assist Level: Set up Set up : To obtain clothing/put away Function - Lower Body Dressing/Undressing What is the patient wearing?: Pants, Socks, Shoes Position: Wheelchair/chair at sink Underwear - Performed by patient: Thread/unthread right underwear leg, Thread/unthread left underwear leg, Pull underwear up/down Underwear - Performed by helper: Pull underwear up/down Pants- Performed by patient: Thread/unthread right pants leg, Thread/unthread left pants leg, Pull pants up/down Pants- Performed by helper: Pull pants up/down Non-skid slipper socks- Performed by patient: Don/doff right sock, Don/doff left sock Non-skid slipper socks- Performed by helper: Don/doff right sock, Don/doff left sock Socks - Performed by patient: Don/doff right sock, Don/doff left sock Shoes - Performed by patient: Don/doff right shoe, Don/doff left shoe Shoes - Performed by helper: Fasten right, Fasten left Assist for footwear: Supervision/touching assist, Setup Assist for lower body dressing: Set up, Supervision or verbal cues Assistive Device Comment: with use of shoe funnel Set up : To obtain clothing/put away  Function - Toileting Toileting steps completed by patient: Performs perineal hygiene, Adjust clothing prior to toileting, Adjust clothing after toileting Toileting steps completed by helper: Adjust clothing after toileting Toileting Assistive Devices: Grab bar or rail Assist level: Two helpers (stedy)  Function - Archivist transfer assistive device: Elevated toilet seat/BSC over toilet, Grab bar Mechanical lift: Stedy Assist level to toilet: Touching or steadying assistance (Pt > 75%) Assist level from toilet: Touching or steadying assistance (Pt > 75%)  Function - Chair/bed transfer Chair/bed transfer method: Stand pivot Chair/bed  transfer assist level: Touching or steadying assistance (Pt > 75%) Chair/bed transfer assistive device: Walker, Armrests, Bedrails Mechanical lift: Stedy Chair/bed transfer details: Manual facilitation for placement, Manual facilitation for weight shifting, Verbal cues for technique, Verbal cues for sequencing, Verbal cues for precautions/safety, Verbal cues for safe use of DME/AE  Function - Locomotion: Wheelchair Will patient use wheelchair at discharge?: Yes Type: Manual Max wheelchair distance: 50 ft Assist Level: Supervision or verbal cues Assist Level: Supervision or verbal cues Wheel 150 feet activity did not occur: Safety/medical concerns Assist Level: Supervision or verbal cues Turns around,maneuvers to table,bed, and toilet,negotiates 3% grade,maneuvers on rugs and over doorsills: No Function - Locomotion: Ambulation Assistive device: Walker-rolling Max distance: 30 Assist level: 2 helpers (max for gait, +2 for w/c follow) Assist level: 2 helpers Walk 50 feet with 2 turns activity did not occur: Safety/medical concerns Walk 150 feet activity did not occur: Safety/medical concerns Walk 10 feet on uneven surfaces activity did not occur: Safety/medical concerns  Function - Comprehension Comprehension: Auditory Comprehension assist level: Follows complex conversation/direction with no assist  Function - Expression Expression: Verbal Expression assist level: Expresses complex 90% of the time/cues < 10% of the time  Function - Social Interaction Social Interaction assist level: Interacts appropriately with others - No medications needed.  Function - Problem Solving Problem solving assist level: Solves basic problems with no assist  Function - Memory Memory assist level: Requires cues to use assistive device Patient normally able to recall (first 3 days only): Current season, Location of own room, Staff names and faces, That he or she is in a hospital  Medical Problem List  and Plan: 1. Ataxia and functional deficitssecondary to left cerebellar infarct  -Cont CIR  2. DVT Prophylaxis/Anticoagulation: Pharmaceutical: Lovenox 3. Pain Management: N/A 4. Mood: Team to provide ego support. LCSW to follow for evaluation and support. Anxiety on Buspar and citalopram, gets dizzy with Buspar changed to PRN 5. Neuropsych: This patient iscapable of making decisions on herown behalf. Dr Kieth Brightly following for relaxation training 6. Skin/Wound Care: Routine pressure relief measures.  7. Fluids/Electrolytes/Nutrition: Monitor I/O.   BMP within acceptable range on 2/9  Encourage fluids  Requests qHS snack 8. HTN: Monitor BP bid.   Resumed Lasix 2/9  Metoprolol 12.5 restarted 2/10 (home 50)  Controlled 2/14 9. OSA: Continue to encourage consistent use of CPAP 10. Morbid obesity: BMI 55.87. consult dietician to educate on CM/Heart healthy diet. Encourage appropriate diet and exercise to help promote weight loss to help promote health  12. Prediabetes: Hgb A1C-6.3. CM diet reinforced. Monitor BS ac/hs for trends.   Overall controlled 2/14 13. CAD s/p CABG: On ASA and Lipitor  14.  Constipation improved on senna S BID may reduce if BMs become excessive  LOS (Days) 12 A FACE TO FACE EVALUATION WAS PERFORMED  Tyberius Ryner Karis Juba 03/22/2016, 10:56 AM

## 2016-03-22 NOTE — Progress Notes (Signed)
Physical Therapy Note  Patient Details  Name: Megan Atkinson MRN: [id number] Date of Birth: 1951-01-29 Today's Date: 03/22/2016  1535-1605, 30 min individual tx Pain: none reported  W/c> mat with RW to L with mod assist due to LOB L without trunk righting responses; to R with min /mod assist for same problem.  Pt stated she is aware of over shifting to L.   Neuromuscular re-education via positioning, mirror feedback, multimodal cues for trunk righting responses in static standing, biased to L.  First bout, pt felt her L knee was about to buckle, duration 30 seconds; 2nd bout, duration 1 minute with active trunk responses noted.  Pt left resting in w/c with all needs within reach.  See function navigator for current status.  Vahe Pienta 03/22/2016, 3:50 PM

## 2016-03-22 NOTE — Progress Notes (Signed)
Occupational Therapy Session Note  Patient Details  Name: Megan NielsenBeverley Persinger MRN: [id number] Date of Birth: 1950/05/03  Today's Date: 03/22/2016 OT Individual Time: [id number] and [id number] OT Individual Time Calculation (min): 55 min and 48 min   Short Term Goals: Week 2:  OT Short Term Goal 1 (Week 2): Pt will complete toilet transfers with supervision with RW OT Short Term Goal 2 (Week 2): Pt will complete LB dressing at sit > stand level with supervision/setup OT Short Term Goal 3 (Week 2): Pt will complete 2 grooming tasks in standing to increase standing tolerance/balance  Skilled Therapeutic Interventions/Progress Updates:    1) Treatment session with focus on functional transfers, sit > stand, and increased problem solving with self-care retraining and w/c mobility.  Pt received in bed, completed bed mobility with bed rails at supervision level.  Stand pivot transfer with use of RW and min assist when transferring to Rt.  Pt continues to demonstrate anxiety with movement, requiring increased cues for reassurance and encouragement as well as setup assistance and min physical assist at best.  Bathing and dressing completed at sit > stand level at sink with pt pushing up from w/c with BUE, then reaching for sink with Rt hand.  Able to wash buttocks and pull up pants and underwear with alternating UE support with setup assist and min cues for sequencing to increase safety.  Plan to progress to sit > stand from EOB with use of RW for self-care tasks to further simulate potential home setup.  Pt propelled w/c through office and ADL apt over carpet with use of BLE mostly and BUE, pt able to maneuver around furniture with increased time and frequent rest breaks due to fatigue.  2) Treatment session with focus on functional transfers.  Pt received in w/c with no c/o pain.  Completed stand pivot transfers Rt and Lt with RW from w/c <> couch in family room.  Pt required setup assist for transfers with  support to maintain positioning of RW while providing cues for appropriate hand placement.  Pt demonstrated difficulty with sit > stand from low couch height with increase in anxiety, however still only requiring min-mod assist with encouragement and cues to scoot to edge of couch.  Pt demonstrating decreased awareness and mental flexibility with increased challenge. Engaged in sit > stand from low mat with focus on weight shift and trunk control.  Engaged in simulated LB dressing with theraband with pt able to pull pants down over hips.  Pt reports need to toilet.  Completed stand pivot transfer back to w/c with min assist and cues for slow pace due to increased anxiety due to urgency.  Completed toilet transfers with min assist to/from toilet with use of grab bar.  Pt able to complete clothing management and hygiene with min guard for standing balance with UE support on grab bar.    Therapy Documentation Precautions:  Precautions Precautions: Fall Precaution Comments:  (Left hemiparesis, vertigo ) Restrictions Weight Bearing Restrictions: No Pain:  Pt with no c/o pain  See Function Navigator for Current Functional Status.   Therapy/Group: Individual Therapy  Rosalio LoudHOXIE, Megan Atkinson 03/22/2016, 9:55 AM

## 2016-03-22 NOTE — Progress Notes (Signed)
Physical Therapy Session Note  Patient Details  Name: Megan Atkinson MRN: [id number] Date of Birth: [birth year]  Today's Date: 03/22/2016 PT Individual Time: 1000-1100 PT Individual Time Calculation (min): 60 min   Short Term Goals: Week 2:  PT Short Term Goal 1 (Week 2): Pt will transfer with RW and min assist PT Short Term Goal 2 (Week 2): Pt will ambulate 87' with RW and mod assist PT Short Term Goal 3 (Week 2): Pt will negotiate 4 steps with 2 rails for strengthening.   Skilled Therapeutic Interventions/Progress Updates:    no c/o pain.  Session focus on patient education, d/c planning, transfers, gait training, and step negotiation.    Stand/pivot from w/c>mat>w/c to pt's R with min assist overall and mod multimodal cues for LE placement and walker positioning.    Gait training in [place] x30' with 2 turns to draw pt's attention to deficits in postural control, midline orientation, weight shifting, and step length.  Pt with several LOB with ambulation in straight path and when turning pt with 2 significant LOBs requiring assist from [facility] to remain upright while therapist provided assist to regain footing.  1 step negotiation with RW x2 trials with maxisky to support patient with additional assist from therapist for walker positioning and cues for posture and weight shift.    PT had extended conversation with pt regarding current progress towards goals and slow progress.  Discussed that gait and stair negotiation at this point is not safe or feasible with care giver for d/c home by initial estimated date.  Discussed home entry that will require at minimum 20' of ambulation and 1 step negotiation to access living area that pt plans to stay in at d/c.  Pt agreeable that she needs more time to be safe to discharge home with a care giver.    LE strengthening 2x10 reps LAQ with 2 second hold and hip flexion.  Pt returned to room at end of session and left upright in w/c with call bell in  reach and needs met.     Therapy Documentation Precautions:  Precautions Precautions: Fall Precaution Comments:  (Left hemiparesis, vertigo ) Restrictions Weight Bearing Restrictions: No   See Function Navigator for Current Functional Status.   Therapy/Group: Individual Therapy  Aditya Nastasi E Penven-Crew 03/22/2016, 11:48 AM

## 2016-03-23 ENCOUNTER — Inpatient Hospital Stay (HOSPITAL_COMMUNITY): Payer: Medicare Other | Admitting: Occupational Therapy

## 2016-03-23 ENCOUNTER — Inpatient Hospital Stay (HOSPITAL_COMMUNITY): Payer: Medicare Other | Admitting: Physical Therapy

## 2016-03-23 DIAGNOSIS — R7989 Other specified abnormal findings of blood chemistry: Secondary | ICD-10-CM

## 2016-03-23 LAB — GLUCOSE, CAPILLARY
GLUCOSE-CAPILLARY: 93 mg/dL (ref 65–99)
Glucose-Capillary: 100 mg/dL — ABNORMAL HIGH (ref 65–99)
Glucose-Capillary: 115 mg/dL — ABNORMAL HIGH (ref 65–99)
Glucose-Capillary: 106 mg/dL — ABNORMAL HIGH (ref 65–99)

## 2016-03-23 NOTE — Progress Notes (Signed)
Recreational Therapy Session Note  Patient Details  Name: Megan NielsenBeverley Maes MRN: [id number] Date of Birth: May 04, 1950 Today's Date: 03/23/2016 Late Entry from 03/22/16  Pain: no c/o Skilled Therapeutic Interventions/Progress Updates: Pt referred by team for participation in TR group with emphasis on Community Reintegration(CR).  Group Goals:  After discussion: >Pt will state 3 types of barriers/obstacles that could be encountered during community pursuits with min questioning cues. > Pt will problem solve at least 2 modifications/solutions to above identified barriers/obstacles with min questioning cues.  Pt will state at least 3 ways to conserve energy during community pursuits with min questioning cues.  Pt participated in education group for 40 minutes meeting the above stated goals with min questioning cues.  Pt states she feels ready for upcoming discharge and future community pursuits with continued improvements.  Pt also identifies family & friends as support system for community pursuits once home. Shanterria Franta 03/23/2016, 4:26 PM

## 2016-03-23 NOTE — Progress Notes (Signed)
Physical Therapy Session Note  Patient Details  Name: Megan Atkinson MRN: [id number] Date of Birth: 1950/07/30  Today's Date: 03/23/2016 PT Individual Time: 1530-1600 PT Individual Time Calculation (min): 30 min   Short Term Goals: Week 2:  PT Short Term Goal 1 (Week 2): Pt will transfer with RW and min assist PT Short Term Goal 2 (Week 2): Pt will ambulate 92' with RW and mod assist PT Short Term Goal 3 (Week 2): Pt will negotiate 4 steps with 2 rails for strengthening.   Skilled Therapeutic Interventions/Progress Updates:    no c/o pain.  Session focus on activity tolerance, gait training, and stair negotiation to simulate home entry.    Pt transfers stand/pivot with RW to L and R with close supervision throughout session with min verbal cues for pacing and posture.  Pt practiced stepping down from curb step (to simulate entry into living room) and pivot into w/c with mod assist to maintain midline posture during step down of RLE.  Gait training x55' with overall min assist, but occasional bouts of close supervision.  Pt notes following gait training that she can tell when she's starting to lean L and corrects by increasing her BOS until she has her balance.  Pt returned to room at end of session and positioned with call bell in reach and needs met.   Therapy Documentation Precautions:  Precautions Precautions: Fall Precaution Comments:  (Left hemiparesis, vertigo ) Restrictions Weight Bearing Restrictions: No   See Function Navigator for Current Functional Status.   Therapy/Group: Individual Therapy  Earnest Conroy Penven-Crew 03/23/2016, 4:16 PM

## 2016-03-23 NOTE — Progress Notes (Signed)
Occupational Therapy Session Note  Patient Details  Name: Megan NielsenBeverley Atkinson MRN: [id number] Date of Birth: [birth year]  Today's Date: 03/23/2016 OT Individual Time: [id number] and 1400-1500 OT Individual Time Calculation (min): 60 min and 60 min   Short Term Goals: Week 2:  OT Short Term Goal 1 (Week 2): Pt will complete toilet transfers with supervision with RW OT Short Term Goal 2 (Week 2): Pt will complete LB dressing at sit > stand level with supervision/setup OT Short Term Goal 3 (Week 2): Pt will complete 2 grooming tasks in standing to increase standing tolerance/balance  Skilled Therapeutic Interventions/Progress Updates:    1) Treatment session with focus on increased awareness of safety concerns with bathing, dressing, and transfers.  Pt received in bed, completed bed mobility from flat bed without bed rails with supervision. Stand pivot transfer with RW to w/c with min guard assist.  Completed bathing at sit > stand level at sink with one UE support on sink for steadying while washing buttocks.  Completed dressing at sit > stand level with use of RW to further increase challenge and decrease reliance on stable sink.  Attempted to pull underwear up without UE support, however pt unable to maintain standing balance with no UE support due to anxiety and decreased balance reactions.  Engaged in sit <> stand from low therapy mat with focus on weight shift and decreased need for verbal cues for hand placement.  Ambulated 5 feet x3 with RW with min assist, incorporating turns to sit on therapy mat.  Pt with 1 LOB to Lt during initial turn, however able to recognize and correct with only min-mod assist.  Noted improved stability and ability to recognize and correct Lt lean in short distance.  Pt will benefit from increased repetitions to build confidence as well as working on increased distance.  2) Treatment session with focus on sit <> stand and w/c propulsion.  Pt received upright in w/c  expressing desire to attend IP Rehab Olympic party.  Pt propelled w/c over tile, thresholds, and carpet with use of BUE and BLE for overall strengthening and endurance as pt will be primarily w/c level at home.  Engaged in sit > stand to participate in selecting snack items and while participating in bingo activity.  Pt completed all sit > stand with supervision transitioning UE from arm rests to table.  Pt able to maintain standing ~2 mins at a time before requiring seated rest break.  Returned to room as above.  Therapy Documentation Precautions:  Precautions Precautions: Fall Precaution Comments:  (Left hemiparesis, vertigo ) Restrictions Weight Bearing Restrictions: No General:   Vital Signs: Therapy Vitals Temp: 99 F (37.2 C) Temp Source: Oral Pulse Rate: 75 Resp: 18 BP: 128/68 Patient Position (if appropriate): Lying Oxygen Therapy SpO2: 96 % O2 Device: Not Delivered Pain:  Pt with no c/o pain  See Function Navigator for Current Functional Status.   Therapy/Group: Individual Therapy  Rosalio LoudHOXIE, Yamila Cragin 03/23/2016, 8:43 AM

## 2016-03-23 NOTE — Progress Notes (Signed)
Physical Therapy Session Note  Patient Details  Name: Megan Atkinson MRN: [id number] Date of Birth: 24-Aug-1950  Today's Date: 03/23/2016 PT Individual Time: 1000-1100 PT Individual Time Calculation (min): 60 min   Short Term Goals: Week 2:  PT Short Term Goal 1 (Week 2): Pt will transfer with RW and min assist PT Short Term Goal 2 (Week 2): Pt will ambulate 27' with RW and mod assist PT Short Term Goal 3 (Week 2): Pt will negotiate 4 steps with 2 rails for strengthening.   Skilled Therapeutic Interventions/Progress Updates:    no c/o pain.  Session focus on gait training, transfers, and turns with RW.    Pt ambulates 4 reps of 10' with RW and overall min assist, occasional mod to facilitate weight shift during turns. Pt requires min multimodal cues for sequencing and weight shifting especially during turns.  Gait training x31' with RW and min assist, pt self selecting rest breaks when needed to ensure gait quality remains consistent.  Stand/pivot from mat>w/c to pt's L with steady assist and RW.  Pt propelled w/c x150' back to room with a combination of BUEs and BLEs with supervision.  Pt left upright in w/c with call bell in reach and needs met.   Therapy Documentation Precautions:  Precautions Precautions: Fall Precaution Comments:  (Left hemiparesis, vertigo ) Restrictions Weight Bearing Restrictions: No   See Function Navigator for Current Functional Status.   Therapy/Group: Individual Therapy  Kaevon Cotta E Penven-Crew 03/23/2016, 11:02 AM

## 2016-03-23 NOTE — Patient Care Conference (Signed)
Inpatient RehabilitationTeam Conference and Plan of Care Update Date: 03/22/2016   Time: 11:00 AM    Patient Name: Megan Atkinson      Medical Record Number: [id number]  Date of Birth: 27-May-1950 Sex: Female         Room/Bed: 4W19C/4W19C-01 Payor Info: Payor: [employer]ICARE / Plan: BCBS MEDICARE / Product Type: *No Product type* /    Admitting Diagnosis: CVA  Admit Date/Time:  03/10/2016  2:54 PM Admission Comments: No comment available   Primary Diagnosis:  Ataxia, post-stroke Principal Problem: Ataxia, post-stroke  Patient Active Problem List   Diagnosis Date Noted  . Elevated serum creatinine   . Slow transit constipation   . Benign essential HTN   . Generalized anxiety disorder   . Gait disturbance, post-stroke 03/13/2016  . Cerebellar stroke, acute (HCC) 03/10/2016  . Super obese (HCC)   . Prediabetes   . Ataxia, post-stroke   . Acute cystitis without hematuria   . CVA (cerebral vascular accident) (HCC) 03/06/2016  . Morbid obesity (HCC) 02/29/2016  . OSA (obstructive sleep apnea) 11/24/2014  . Dyspnea 10/02/2014  . Anemia 04/05/2011  . Urinary tract infection, site not specified 04/05/2011  . Diabetes mellitus (HCC) 02/22/2011  . Hyperlipidemia 02/22/2011  . CAD (coronary artery disease) 01/07/2011  . Hypertension 01/07/2011    Expected Discharge Date: Expected Discharge Date: 04/05/16  Team Members Present: Physician leading conference: Dr. Maryla Morrow Social Worker Present: Dossie Der, LCSW Nurse Present: Carmie End, RN PT Present: Teodoro Kil, PT;Rodney Leo Grosser, PT OT Present: Rosalio Loud, OT SLP Present: Feliberto Gottron, SLP PPS Coordinator present : Tora Duck, RN, CRRN     Current Status/Progress Goal Weekly Team Focus  Medical   Ataxia and functional deficitssecondary to left cerebellar infarct  Mobility, safety  See above   Bowel/Bladder   Contin  Managed bowel and bladder program  Monitor    Swallow/Nutrition/  Hydration             ADL's   supervision bathing and dressing at sit > stand level with one UE support on sink or RW, min-mod assist stand pivot transfers with grab bar or RW  Supervision overall  ADL retraining, BUE/BLE strengthening, sit > stand, transfers, dynamic standing balance, d/c planning, family education   Mobility   mod/max overall  downgraded to supervision/min assist  activity tolerance, relaxation, transfers, gait, 1 step down    Communication             Safety/Cognition/ Behavioral Observations            Pain   No c/o pain  <3  Monitor for nonverbal cues of pain   Skin   Bruising to abdomen, otherwise skin CDI  No additional skin breakdown  Monitor q shift. Encourage turn q 2hrs      *See Care Plan and progress notes for long and short-term goals.  Barriers to Discharge: Mobility, safety, HTN, consiptation    Possible Resolutions to Barriers:  See above, optimize BP meds, CBGs appear controlled    Discharge Planning/Teaching Needs:  Pt not realistic regarding her current level fo functioning, feels can be home alone and will be ambulating, which is not true. Will need to have husband come in if going to assist her and talk wiht her son's.      Team Discussion:  Pt taking longer to meet her goals and will need an extension in her stay. PT downgraded goals to min assist level, pt will need to  have a caregiver with her to assist. Pt is somewhat denial regarding her care needs and functional level. MD-adjusting meds for BP. Pt being seen by neuro-psych for anxiety and fear of falling issue.  Revisions to Treatment Plan:  Downgraded goals to min assist and asked for extension of one week 2/28   Continued Need for Acute Rehabilitation Level of Care: The patient requires daily medical management by a physician with specialized training in physical medicine and rehabilitation for the following conditions: Daily direction of a multidisciplinary physical rehabilitation  program to ensure safe treatment while eliciting the highest outcome that is of practical value to the patient.: Yes Daily medical management of patient stability for increased activity during participation in an intensive rehabilitation regime.: Yes Daily analysis of laboratory values and/or radiology reports with any subsequent need for medication adjustment of medical intervention for : Neurological problems;Blood pressure problems  Jamear Carbonneau, Lemar LivingsRebecca G 03/23/2016, 1:30 PM

## 2016-03-23 NOTE — Progress Notes (Signed)
Social Work Patient ID: Megan Atkinson, female   DOB: Mar 06, 1950, 66 y.o.   MRN: [id number]  Met with pt to discuss how much better she did today in therapies, she feels the extension will not be needed and she will Be able to go home on Tuesday. She is very pleased with herself and the fact she is walking in therapies now. She feels she will be ok at home and is working on getting into home with therapy team. Have asked insurance for extension To meet the goals therapy has set for her, since not progressing as quickly as they though she would. Will await progress and insurance input.

## 2016-03-23 NOTE — Progress Notes (Signed)
Subjective/Complaints: Pt seen laying in bed this AM. She slept well overnight.  She jokingly wants her discharge papers signed.   ROS: Denies CP, SOB, N/V/D  Objective: Vital Signs: Blood pressure 128/68, pulse 75, temperature 99 F (37.2 C), temperature source Oral, resp. rate 18, height 5\' 7"  (1.702 m), weight (!) 169.8 kg (374 lb 6.4 oz), SpO2 96 %. No results found. Results for orders placed or performed during the hospital encounter of 03/10/16 (from the past 72 hour(s))  Glucose, capillary     Status: None   Collection Time: 03/20/16 12:06 PM  Result Value Ref Range   Glucose-Capillary 87 65 - 99 mg/dL  Glucose, capillary     Status: None   Collection Time: 03/20/16  5:03 PM  Result Value Ref Range   Glucose-Capillary 91 65 - 99 mg/dL  Glucose, capillary     Status: Abnormal   Collection Time: 03/20/16  8:18 PM  Result Value Ref Range   Glucose-Capillary 100 (H) 65 - 99 mg/dL  Glucose, capillary     Status: Abnormal   Collection Time: 03/21/16  6:54 AM  Result Value Ref Range   Glucose-Capillary 101 (H) 65 - 99 mg/dL  Glucose, capillary     Status: Abnormal   Collection Time: 03/21/16 11:32 AM  Result Value Ref Range   Glucose-Capillary 102 (H) 65 - 99 mg/dL  Glucose, capillary     Status: Abnormal   Collection Time: 03/21/16  5:02 PM  Result Value Ref Range   Glucose-Capillary 62 (L) 65 - 99 mg/dL  Glucose, capillary     Status: None   Collection Time: 03/21/16  5:31 PM  Result Value Ref Range   Glucose-Capillary 81 65 - 99 mg/dL   Comment 1 Notify RN   Glucose, capillary     Status: Abnormal   Collection Time: 03/21/16  8:58 PM  Result Value Ref Range   Glucose-Capillary 124 (H) 65 - 99 mg/dL  Glucose, capillary     Status: Abnormal   Collection Time: 03/22/16  6:31 AM  Result Value Ref Range   Glucose-Capillary 107 (H) 65 - 99 mg/dL  Glucose, capillary     Status: Abnormal   Collection Time: 03/22/16 11:40 AM  Result Value Ref Range   Glucose-Capillary  113 (H) 65 - 99 mg/dL     General: NAD. Vital signs reviewed. Morbid obesity Heart: RRR. No JVD.  Lungs: Clear to auscultation, breathing unlabored Abdomen: Positive bowel sounds, soft  Musculoskeletal: No edema. No tenderness Neurologic: Alert Motor 5/5 in bilateral deltoid, bicep, tricep, grip,4+/5 hip flexor, knee extensors, ankle dorsiflexor and plantar flexor Minimal LUE ataxia (unchanged) Skin: No evidence of breakdown, no evidence of rash Psych: Mood and affect are appropriate   Assessment/Plan: 1. Functional deficits secondary to Left cerebellar infarct which require 3+ hours per day of interdisciplinary therapy in a comprehensive inpatient rehab setting. Physiatrist is providing close team supervision and 24 hour management of active medical problems listed below. Physiatrist and rehab team continue to assess barriers to discharge/monitor patient progress toward functional and medical goals. FIM: Function - Bathing Position: Wheelchair/chair at sink Body parts bathed by patient: Right arm, Left arm, Chest, Abdomen, Right upper leg, Left upper leg, Front perineal area, Buttocks Body parts bathed by helper: Buttocks Bathing not applicable: Right lower leg, Left lower leg, Back Assist Level: Supervision or verbal cues, Set up Assistive Device Comment: with long handled sponge Set up : To obtain items  Function- Upper Body Dressing/Undressing What is the patient  wearing?: Pull over shirt/dress Pull over shirt/dress - Perfomed by patient: Thread/unthread right sleeve, Thread/unthread left sleeve, Put head through opening, Pull shirt over trunk Assist Level: Set up Set up : To obtain clothing/put away Function - Lower Body Dressing/Undressing What is the patient wearing?: Pants, Socks, Shoes, Underwear Position: Wheelchair/chair at sink Underwear - Performed by patient: Thread/unthread right underwear leg, Thread/unthread left underwear leg, Pull underwear up/down Underwear -  Performed by helper: Pull underwear up/down Pants- Performed by patient: Thread/unthread right pants leg, Thread/unthread left pants leg, Pull pants up/down Pants- Performed by helper: Pull pants up/down Non-skid slipper socks- Performed by patient: Don/doff right sock, Don/doff left sock Non-skid slipper socks- Performed by helper: Don/doff right sock, Don/doff left sock Socks - Performed by patient: Don/doff right sock, Don/doff left sock Shoes - Performed by patient: Don/doff right shoe, Don/doff left shoe Shoes - Performed by helper: Fasten right, Fasten left Assist for footwear: Supervision/touching assist, Setup Assist for lower body dressing: Set up, Supervision or verbal cues Assistive Device Comment: with use of shoe funnel Set up : To obtain clothing/put away  Function - Toileting Toileting steps completed by patient: Performs perineal hygiene, Adjust clothing prior to toileting, Adjust clothing after toileting Toileting steps completed by helper: Adjust clothing after toileting Toileting Assistive Devices: Grab bar or rail Assist level: Touching or steadying assistance (Pt.75%)  Function - Toilet Transfers Toilet transfer assistive device: Elevated toilet seat/BSC over toilet, Grab bar Mechanical lift: Stedy Assist level to toilet: Touching or steadying assistance (Pt > 75%) Assist level from toilet: Touching or steadying assistance (Pt > 75%)  Function - Chair/bed transfer Chair/bed transfer method: Stand pivot Chair/bed transfer assist level: Touching or steadying assistance (Pt > 75%) Chair/bed transfer assistive device: Biochemist, clinical lift: Stedy Chair/bed transfer details: Manual facilitation for weight shifting, Verbal cues for safe use of DME/AE  Function - Locomotion: Wheelchair Will patient use wheelchair at discharge?: Yes Type: Manual Max wheelchair distance: 50 ft Assist Level: Supervision or verbal cues Assist Level: Supervision or verbal cues Wheel  150 feet activity did not occur: Safety/medical concerns Assist Level: Supervision or verbal cues Turns around,maneuvers to table,bed, and toilet,negotiates 3% grade,maneuvers on rugs and over doorsills: No Function - Locomotion: Ambulation Assistive device: Walker-rolling Max distance: 30 Assist level: 2 helpers (max for gait, +2 for w/c follow) Assist level: 2 helpers Walk 50 feet with 2 turns activity did not occur: Safety/medical concerns Walk 150 feet activity did not occur: Safety/medical concerns Walk 10 feet on uneven surfaces activity did not occur: Safety/medical concerns  Function - Comprehension Comprehension: Auditory Comprehension assist level: Follows complex conversation/direction with no assist  Function - Expression Expression: Verbal Expression assist level: Expresses complex 90% of the time/cues < 10% of the time  Function - Social Interaction Social Interaction assist level: Interacts appropriately with others - No medications needed.  Function - Problem Solving Problem solving assist level: Solves basic problems with no assist  Function - Memory Memory assist level: Requires cues to use assistive device Patient normally able to recall (first 3 days only): Current season, Location of own room, Staff names and faces, That he or she is in a hospital  Medical Problem List and Plan: 1. Ataxia and functional deficitssecondary to left cerebellar infarct  -Cont CIR  2. DVT Prophylaxis/Anticoagulation: Pharmaceutical: Lovenox 3. Pain Management: N/A 4. Mood: Team to provide ego support. LCSW to follow for evaluation and support. Anxiety on Buspar and citalopram, gets dizzy with Buspar changed to PRN 5. Neuropsych: This  patient iscapable of making decisions on herown behalf. Dr Kieth Brightly following for relaxation training 6. Skin/Wound Care: Routine pressure relief measures.  7. Fluids/Electrolytes/Nutrition: Monitor I/O.   BMP within acceptable  range on 2/9  Labs ordered for tomorrow  Encourage fluids  Requests qHS snack 8. HTN: Monitor BP bid.   Resumed Lasix 2/9  Metoprolol 12.5 restarted 2/10 (home 50)  Controlled 2/15 9. OSA: Continue to encourage consistent use of CPAP 10. Morbid obesity: BMI 55.87. consult dietician to educate on CM/Heart healthy diet. Encourage appropriate diet and exercise to help promote weight loss to help promote health  12. Prediabetes: Hgb A1C-6.3. CM diet reinforced. Monitor BS ac/hs for trends.   Overall controlled 2/15 13. CAD s/p CABG: On ASA and Lipitor  14.  Constipation improved on senna S BID may reduce if BMs become excessive  LOS (Days) 13 A FACE TO FACE EVALUATION WAS PERFORMED  Ankit Karis Juba 03/23/2016, 8:53 AM

## 2016-03-24 ENCOUNTER — Inpatient Hospital Stay (HOSPITAL_COMMUNITY): Payer: Medicare Other | Admitting: Occupational Therapy

## 2016-03-24 ENCOUNTER — Inpatient Hospital Stay (HOSPITAL_COMMUNITY): Payer: Medicare Other | Admitting: Physical Therapy

## 2016-03-24 DIAGNOSIS — D62 Acute posthemorrhagic anemia: Secondary | ICD-10-CM

## 2016-03-24 LAB — GLUCOSE, CAPILLARY
GLUCOSE-CAPILLARY: 112 mg/dL — AB (ref 65–99)
Glucose-Capillary: 102 mg/dL — ABNORMAL HIGH (ref 65–99)
Glucose-Capillary: 122 mg/dL — ABNORMAL HIGH (ref 65–99)
Glucose-Capillary: 101 mg/dL — ABNORMAL HIGH (ref 65–99)

## 2016-03-24 LAB — BASIC METABOLIC PANEL
Anion gap: 7 (ref 5–15)
BUN: 19 mg/dL (ref 6–20)
CHLORIDE: 109 mmol/L (ref 101–111)
CO2: 24 mmol/L (ref 22–32)
CREATININE: 0.83 mg/dL (ref 0.44–1.00)
Calcium: 9.2 mg/dL (ref 8.9–10.3)
GFR calc Af Amer: >60 mL/min (ref >60)
GFR calc non Af Amer: >60 mL/min (ref >60)
Glucose, Bld: 97 mg/dL (ref 65–99)
Potassium: 4.1 mmol/L (ref 3.5–5.1)
Sodium: 140 mmol/L (ref 135–145)

## 2016-03-24 LAB — CBC
HCT: 38.7 % (ref 36.0–46.0)
Hemoglobin: 11.8 g/dL — ABNORMAL LOW (ref 12.0–15.0)
MCH: 29.2 pg (ref 26.0–34.0)
MCHC: 30.5 g/dL (ref 30.0–36.0)
MCV: 95.8 fL (ref 78.0–100.0)
PLATELETS: 183 10*3/uL (ref 150–400)
RBC: 4.04 MIL/uL (ref 3.87–5.11)
RDW: 14.2 % (ref 11.5–15.5)
WBC: 7 10*3/uL (ref 4.0–10.5)

## 2016-03-24 NOTE — Progress Notes (Signed)
Physical Therapy Weekly Progress Note  Patient Details  Name: Megan Atkinson MRN: [id number] Date of Birth: 12/30/1950  Beginning of progress report period: March 17, 2016 End of progress report period: March 24, 2016  Patient has met 2 of 3 short term goals.  Pt has made measurable gains in therapy this week and is currently able to transfer with min assist in at least 75% of observed trials and has been consistently ambulating short distances with min assist.  Sessions focus on functional mobility as well as d/c planning.  Pt adamant that she does not want to have ambulance transport home, so will need to be able to safely ambulate into home and down 1 step with RW and assist from a care giver.  Once in the home, plan for w/c level mobility in her sitting room.  Have discussed high fall risk with pt at length and pt verbalizes understanding.   Patient continues to demonstrate the following deficits muscle weakness, decreased cardiorespiratoy endurance, unbalanced muscle activation, ataxia and decreased coordination, decreased midline orientation and decreased standing balance, decreased postural control and decreased balance strategies and therefore will continue to benefit from skilled PT intervention to increase functional independence with mobility.  Patient progressing toward long term goals..  Continue plan of care.  PT Short Term Goals Week 2:  PT Short Term Goal 1 (Week 2): Pt will transfer with RW and min assist PT Short Term Goal 1 - Progress (Week 2): Met PT Short Term Goal 2 (Week 2): Pt will ambulate 25' with RW and mod assist PT Short Term Goal 2 - Progress (Week 2): Met PT Short Term Goal 3 (Week 2): Pt will negotiate 4 steps with 2 rails for strengthening.  PT Short Term Goal 3 - Progress (Week 2): Not met Week 3:  PT Short Term Goal 1 (Week 3): =LTGs due to ELOS   See Function Navigator for Current Functional Status.   Caitlin E Penven-Crew 03/24/2016, 8:56 AM  

## 2016-03-24 NOTE — Progress Notes (Signed)
Physical Therapy Session Note  Patient Details  Name: Megan Atkinson MRN: [id number] Date of Birth: Jun 13, 1950  Today's Date: 03/24/2016 PT Individual Time: 1000-1100 PT Individual Time Calculation (min): 60 min   Short Term Goals: Week 3:  PT Short Term Goal 1 (Week 3): =LTGs due to ELOS  Skilled Therapeutic Interventions/Progress Updates:    no c/o pain.  Session focus on LE therex with HEP, transfers, gait, w/c propulsion, and pt education.   Ongoing education throughout session with pt regarding high fall risk at home, expected progression with HHPT to include gait and stair negotiation to maximize independence and decrease care giver burden, and home entry.  Pt now states that she has an entrance through her laundry room that would not require her to negotiate any steps, will f/u with family this weekend to clarify all home entry options and choose safest for patient.    Pt propelled w/c to therapy gym with increased time using BLEs and BUEs.  PT instructed pt in HEP including LAQ with 3 second hold, seated marching, seated isometric hip adduction, mini squats, and standing heel raises.  Pt performs 2x10 reps of each exercise and PT provided handout HEP for pt to continue at home.  Discussed safety with standing exercises to include blocking RW against a wall or sturdy piece of furniture to reduce fall risk.  Gait training x55' with close supervision with pt taking several short rest breaks to regain footing and balance.  Discussed family providing steady assist at d/c until they are able to anticipate pt's needs consistently in order to reduce assist to supervision only.  Stand/pivot transfer to L at end of session with supervision and RW.  Pt verbalized understanding of all education throughout session but education to continue throughout LOS to ensure maximum retention.  Pt returned to room and left in w/c with call bell in reach and needs met.   Therapy Documentation Precautions:   Precautions Precautions: Fall Precaution Comments:  (Left hemiparesis, vertigo ) Restrictions Weight Bearing Restrictions: No  See Function Navigator for Current Functional Status.   Therapy/Group: Individual Therapy  Jossue Rubenstein E Penven-Crew 03/24/2016, 11:40 AM

## 2016-03-24 NOTE — Progress Notes (Signed)
Subjective/Complaints: Pt seen sitting up at the edge of the bed this AM working with therapies.  She slept well overnight.  She still wants her discharge paperwork.  ROS: Denies CP, SOB, N/V/D  Objective: Vital Signs: Blood pressure (!) 127/45, pulse 64, temperature 97.4 F (36.3 C), temperature source Oral, resp. rate 18, height '5\' 7"'  (1.702 m), weight (!) 169.8 kg (374 lb 6.4 oz), SpO2 99 %. No results found. Results for orders placed or performed during the hospital encounter of 03/10/16 (from the past 72 hour(s))  Glucose, capillary     Status: Abnormal   Collection Time: 03/21/16 11:32 AM  Result Value Ref Range   Glucose-Capillary 102 (H) 65 - 99 mg/dL  Glucose, capillary     Status: Abnormal   Collection Time: 03/21/16  5:02 PM  Result Value Ref Range   Glucose-Capillary 62 (L) 65 - 99 mg/dL  Glucose, capillary     Status: None   Collection Time: 03/21/16  5:31 PM  Result Value Ref Range   Glucose-Capillary 81 65 - 99 mg/dL   Comment 1 Notify RN   Glucose, capillary     Status: Abnormal   Collection Time: 03/21/16  8:58 PM  Result Value Ref Range   Glucose-Capillary 124 (H) 65 - 99 mg/dL  Glucose, capillary     Status: Abnormal   Collection Time: 03/22/16  6:31 AM  Result Value Ref Range   Glucose-Capillary 107 (H) 65 - 99 mg/dL  Glucose, capillary     Status: Abnormal   Collection Time: 03/22/16 11:40 AM  Result Value Ref Range   Glucose-Capillary 113 (H) 65 - 99 mg/dL  Glucose, capillary     Status: Abnormal   Collection Time: 03/22/16  4:38 PM  Result Value Ref Range   Glucose-Capillary 100 (H) 65 - 99 mg/dL  Glucose, capillary     Status: Abnormal   Collection Time: 03/22/16  8:32 PM  Result Value Ref Range   Glucose-Capillary 115 (H) 65 - 99 mg/dL  Glucose, capillary     Status: Abnormal   Collection Time: 03/23/16  6:27 AM  Result Value Ref Range   Glucose-Capillary 106 (H) 65 - 99 mg/dL  Glucose, capillary     Status: None   Collection Time: 03/23/16  11:51 AM  Result Value Ref Range   Glucose-Capillary 93 65 - 99 mg/dL   Comment 1 Notify RN   Basic metabolic panel     Status: None   Collection Time: 03/24/16  5:30 AM  Result Value Ref Range   Sodium 140 135 - 145 mmol/L   Potassium 4.1 3.5 - 5.1 mmol/L   Chloride 109 101 - 111 mmol/L   CO2 24 22 - 32 mmol/L   Glucose, Bld 97 65 - 99 mg/dL   BUN 19 6 - 20 mg/dL   Creatinine, Ser 0.83 0.44 - 1.00 mg/dL   Calcium 9.2 8.9 - 10.3 mg/dL   GFR calc non Af Amer >60 >60 mL/min   GFR calc Af Amer >60 >60 mL/min    Comment: (NOTE) The eGFR has been calculated using the CKD EPI equation. This calculation has not been validated in all clinical situations. eGFR's persistently <60 mL/min signify possible Chronic Kidney Disease.    Anion gap 7 5 - 15  CBC     Status: Abnormal   Collection Time: 03/24/16  5:30 AM  Result Value Ref Range   WBC 7.0 4.0 - 10.5 K/uL   RBC 4.04 3.87 - 5.11 MIL/uL  Hemoglobin 11.8 (L) 12.0 - 15.0 g/dL   HCT 38.7 36.0 - 46.0 %   MCV 95.8 78.0 - 100.0 fL   MCH 29.2 26.0 - 34.0 pg   MCHC 30.5 30.0 - 36.0 g/dL   RDW 14.2 11.5 - 15.5 %   Platelets 183 150 - 400 K/uL     General: NAD. Vital signs reviewed. Morbid obesity Heart: RRR. No JVD.  Lungs: Clear to auscultation, breathing Unlabored Abdomen: Positive bowel sounds, soft  Musculoskeletal: No edema. No tenderness Neurologic: Alert Motor 5/5 in bilateral deltoid, bicep, tricep, grip,4+/5 hip flexor, knee extensors, ankle dorsiflexor and plantar flexor Minimal LUE ataxia (stable) Skin: No evidence of breakdown, no evidence of rash Psych: Mood and affect are appropriate   Assessment/Plan: 1. Functional deficits secondary to Left cerebellar infarct which require 3+ hours per day of interdisciplinary therapy in a comprehensive inpatient rehab setting. Physiatrist is providing close team supervision and 24 hour management of active medical problems listed below. Physiatrist and rehab team continue to  assess barriers to discharge/monitor patient progress toward functional and medical goals. FIM: Function - Bathing Position: Shower Body parts bathed by patient: Right arm, Left arm, Chest, Abdomen, Right upper leg, Left upper leg, Front perineal area, Buttocks, Right lower leg, Left lower leg, Back Body parts bathed by helper: Buttocks Bathing not applicable: Right lower leg, Left lower leg, Back Assist Level: Supervision or verbal cues, Set up Assistive Device Comment: with long handled sponge Set up : To obtain items  Function- Upper Body Dressing/Undressing What is the patient wearing?: Pull over shirt/dress Pull over shirt/dress - Perfomed by patient: Thread/unthread right sleeve, Thread/unthread left sleeve, Put head through opening, Pull shirt over trunk Assist Level: More than reasonable time Set up : To obtain clothing/put away Function - Lower Body Dressing/Undressing What is the patient wearing?: Pants, Socks, Shoes, Underwear Position: Wheelchair/chair at sink Underwear - Performed by patient: Thread/unthread right underwear leg, Thread/unthread left underwear leg, Pull underwear up/down Underwear - Performed by helper: Pull underwear up/down Pants- Performed by patient: Thread/unthread right pants leg, Thread/unthread left pants leg, Pull pants up/down Pants- Performed by helper: Pull pants up/down Non-skid slipper socks- Performed by patient: Don/doff right sock, Don/doff left sock Non-skid slipper socks- Performed by helper: Don/doff right sock, Don/doff left sock Socks - Performed by patient: Don/doff right sock, Don/doff left sock Shoes - Performed by patient: Don/doff right shoe, Don/doff left shoe Shoes - Performed by helper: Fasten right, Fasten left Assist for footwear: Supervision/touching assist, Setup Assist for lower body dressing: Set up, Supervision or verbal cues Assistive Device Comment: with use of shoe funnel Set up : To obtain clothing/put  away  Function - Toileting Toileting steps completed by patient: Adjust clothing prior to toileting, Performs perineal hygiene, Adjust clothing after toileting Toileting steps completed by helper: Adjust clothing after toileting Toileting Assistive Devices: Grab bar or rail Assist level: Set up/obtain supplies  Function - Toilet Transfers Toilet transfer assistive device: Elevated toilet seat/BSC over toilet, Grab bar Mechanical lift: Stedy Assist level to toilet: Supervision or verbal cues Assist level from toilet: Supervision or verbal cues  Function - Chair/bed transfer Chair/bed transfer method: Stand pivot, Ambulatory Chair/bed transfer assist level: Supervision or verbal cues Chair/bed transfer assistive device: Armrests, Walker Mechanical lift: Stedy Chair/bed transfer details: Manual facilitation for weight shifting, Verbal cues for safe use of DME/AE  Function - Locomotion: Wheelchair Will patient use wheelchair at discharge?: Yes Type: Manual Max wheelchair distance: 150 Assist Level: Supervision or verbal cues  Assist Level: Supervision or verbal cues Wheel 150 feet activity did not occur: Safety/medical concerns Assist Level: Supervision or verbal cues Turns around,maneuvers to table,bed, and toilet,negotiates 3% grade,maneuvers on rugs and over doorsills: No Function - Locomotion: Ambulation Assistive device: Walker-rolling Max distance: 31 Assist level: Moderate assist (Pt 50 - 74%) Assist level: Moderate assist (Pt 50 - 74%) Walk 50 feet with 2 turns activity did not occur: Safety/medical concerns Walk 150 feet activity did not occur: Safety/medical concerns Walk 10 feet on uneven surfaces activity did not occur: Safety/medical concerns  Function - Comprehension Comprehension: Auditory Comprehension assist level: Follows complex conversation/direction with no assist  Function - Expression Expression: Verbal Expression assist level: Expresses complex 90% of  the time/cues < 10% of the time  Function - Social Interaction Social Interaction assist level: Interacts appropriately with others - No medications needed.  Function - Problem Solving Problem solving assist level: Solves basic problems with no assist  Function - Memory Memory assist level: Requires cues to use assistive device Patient normally able to recall (first 3 days only): Current season, Location of own room, Staff names and faces, That he or she is in a hospital  Medical Problem List and Plan: 1. Ataxia and functional deficitssecondary to left cerebellar infarct  -Cont CIR  2. DVT Prophylaxis/Anticoagulation: Pharmaceutical: Lovenox 3. Pain Management: N/A 4. Mood: Team to provide ego support. LCSW to follow for evaluation and support. Anxiety on Buspar and citalopram, gets dizzy with Buspar changed to PRN 5. Neuropsych: This patient iscapable of making decisions on herown behalf. Dr Sima Matas following for relaxation training 6. Skin/Wound Care: Routine pressure relief measures.  7. Fluids/Electrolytes/Nutrition: Monitor I/O.   BMP within acceptable range on 2/16  Encourage fluids  Requests qHS snack 8. HTN: Monitor BP bid.   Resumed Lasix 2/9  Metoprolol 12.5 restarted 2/10 (home 50)  Controlled 2/16 9. OSA: Continue to encourage consistent use of CPAP 10. Morbid obesity: BMI 55.87. consult dietician to educate on CM/Heart healthy diet. Encourage appropriate diet and exercise to help promote weight loss to help promote health  12. Prediabetes: Hgb A1C-6.3. CM diet reinforced. Monitor BS ac/hs for trends.   Controlled 2/16, will change CBGs to daily 13. CAD s/p CABG: On ASA and Lipitor  14.  Constipation improved on senna S BID may reduce if BMs become excessive 15. Mild ABLA  Hb 11.8 on 2/16  Cont to monitor  LOS (Days) 14 A FACE TO FACE EVALUATION WAS PERFORMED  Adryan Druckenmiller Lorie Phenix 03/24/2016, 9:08 AM

## 2016-03-24 NOTE — Progress Notes (Signed)
Physical Therapy Session Note  Patient Details  Name: Megan Atkinson MRN: [id number] Date of Birth: 1950-04-28  Today's Date: 03/24/2016 PT Individual Time: 1600-1630 PT Individual Time Calculation (min): 30 min   Short Term Goals: Week 3:  PT Short Term Goal 1 (Week 3): =LTGs due to ELOS  Skilled Therapeutic Interventions/Progress Updates:    no c/o pain, session focus on UE strengthening, cardiovascular endurance, sitting balance, and transfers.    Pt performs 2x10 reps of bicep curls, shoulder press, and shoulder roll (posterior) with 4.4# weighted ball.  5x30 second trials of boxing in unsupported sitting for strengthening and endurance.  Pt returned to room at end of session and performed squat/pivot transfer to recliner with min assist.  Positioned to comfort with call bell in reach and needs met.   Therapy Documentation Precautions:  Precautions Precautions: Fall Precaution Comments:  (Left hemiparesis, vertigo ) Restrictions Weight Bearing Restrictions: No   See Function Navigator for Current Functional Status.   Therapy/Group: Individual Therapy  Earnest Conroy Penven-Crew 03/24/2016, 4:42 PM

## 2016-03-24 NOTE — Progress Notes (Signed)
RT placed patient on CPAP HS. No O2 bleed in needed. Patient tolerating well.  

## 2016-03-24 NOTE — Progress Notes (Signed)
Occupational Therapy Session Note  Patient Details  Name: Megan NielsenBeverley Atkinson MRN: [id number] Date of Birth: 1950/11/21  Today's Date: 03/24/2016 OT Individual Time: 4098-11910730-0830 and 1300-1343 OT Individual Time Calculation (min): 60 min and 43 min   Short Term Goals: Week 2:  OT Short Term Goal 1 (Week 2): Pt will complete toilet transfers with supervision with RW OT Short Term Goal 2 (Week 2): Pt will complete LB dressing at sit > stand level with supervision/setup OT Short Term Goal 3 (Week 2): Pt will complete 2 grooming tasks in standing to increase standing tolerance/balance  Skilled Therapeutic Interventions/Progress Updates:    1) Treatment session with focus on increased safety awareness and independence with transfers.  Pt received in bed upon arrival.  Completed bed mobility from flat bed without bed rails with supervision.  Stand pivot transfer bed > w/c stand pivot with RW with close supervision.  Bathing completed at sit > stand level in room shower.  Pt completed stand pivot transfer to Rt into shower with use of grab bar and close supervision, required min assist when exiting shower due to turning to Lt.  Bathing completed with intermittent supervision for standing balance with use of grab bar.  Dressing completed at sit >stand level with use of RW, pt able to demonstrate short bouts of standing without UE support - however with close supervision.  Completed toilet transfers with close supervision to Rt and Lt with use of grab bar, pt successful with completing hygiene with setup assist for wet wash cloth.  2) Treatment session with focus on w/c propulsion and sit <> stand.  Pt expressed desire to go outside during therapy session.  Pt propelled w/c over tile and thresholds with increased time with use of BUE and BLE for overall strengthening and endurance.  Engaged in sit > stand outside on semi-uneven surface with use of RW for UE support.  Pt initially required min assist, but progressed  to supervision during session.  Engaged in discussion regarding d/c planning and family education with sons on Saturday and Tuesday prior to d/c.  Therapy Documentation Precautions:  Precautions Precautions: Fall Precaution Comments:  (Left hemiparesis, vertigo ) Restrictions Weight Bearing Restrictions: No General:   Vital Signs: Therapy Vitals Temp: 97.4 F (36.3 C) Temp Source: Oral Pulse Rate: 64 Resp: 18 BP: (!) 127/45 Patient Position (if appropriate): Lying Oxygen Therapy SpO2: 99 % O2 Device: Not Delivered Pain:  Pt with no c/o pain  See Function Navigator for Current Functional Status.   Therapy/Group: Individual Therapy  Rosalio LoudHOXIE, Jasan Doughtie 03/24/2016, 8:23 AM

## 2016-03-25 ENCOUNTER — Ambulatory Visit (HOSPITAL_COMMUNITY): Payer: Medicare Other | Admitting: Physical Therapy

## 2016-03-25 ENCOUNTER — Inpatient Hospital Stay (HOSPITAL_COMMUNITY): Payer: Medicare Other | Admitting: Occupational Therapy

## 2016-03-25 NOTE — Progress Notes (Signed)
Occupational Therapy Weekly Progress Note  Patient Details  Name: Megan Atkinson MRN: [id number] Date of Birth: 06/27/1950  Beginning of progress report period: March 20, 2016 End of progress report period: March 25, 2016  Today's Date: 03/25/2016 OT Individual Time: 7034-0352 OT Individual Time Calculation (min): 55 min    Patient has met 2 of 3 short term goals.  Pt is making steady progress towards goals.  Pt has progressed to stand pivot transfers with RW with supervision and ambulating up to 50 feet with min guard assist with RW.  Pt is completing bathing at sit > stand level with supervision for standing balance and setup to obtain items.  Pt continues to be impacted by her anxiety but has demonstrated improvements over the past week.  Continue to recommend 24/7 supervision due to pt's high fall risk, decreased sequencing, and need for setup at w/c level.  Recommend min assist with any ambulation at this time.  Pt's adult sons live out of town and husband does not live in her home, family to continue to work on who will provide recommended supervision.  Patient continues to demonstrate the following deficits: muscle weakness, decreased cardiorespiratoy endurance, unbalanced muscle activation and ataxia, decreased problem solving and decreased standing balance, decreased postural control and decreased balance strategies  and therefore will continue to benefit from skilled OT intervention to enhance overall performance with BADL and Reduce care partner burden.  Patient progressing toward long term goals..  Continue plan of care.  OT Short Term Goals Week 2:  OT Short Term Goal 1 (Week 2): Pt will complete toilet transfers with supervision with RW OT Short Term Goal 1 - Progress (Week 2): Partly met OT Short Term Goal 2 (Week 2): Pt will complete LB dressing at sit > stand level with supervision/setup OT Short Term Goal 2 - Progress (Week 2): Met OT Short Term Goal 3 (Week 2): Pt  will complete 2 grooming tasks in standing to increase standing tolerance/balance OT Short Term Goal 3 - Progress (Week 2): Met Week 3:  OT Short Term Goal 1 (Week 3): STG = LTGs due to remaining LOS  Skilled Therapeutic Interventions/Progress Updates:    Treatment session with focus on ADL retraining, functional transfers, standing balance without UE support during bathing and dressing tasks.  Pt received eating breakfast.  Engaged in discussion with pt regarding family concerns, spoke with pt's son - Vincente Liberty regarding home setup and pt current progress towards goals as well as current therapy recommendations for 24/7 supervision.  Pt continues to report that she will be fine and that everyone worries too much.  Pt completed bathing at sit > stand level at sink with setup assist.  Dressing completed at sit > stand level without UE support while standing, with pt able to pull underwear and pants up while maintaining unsupported stance.  Pt donned socks and shoes this session without use of AD.  Completed stand pivot transfer w/c to [facility] over toilet with pt completing without use of grab bar, able to steady self with handle on BSC.  Hygiene and clothing management completed with RW in front for UE support.  Engaged in discussion regarding recommendation for use of BSC next to couch for majority of toileting needs as pt will not be able to access bathroom via w/c and strongly recommending min assist with ambulation at this time.  Therapy Documentation Precautions:  Precautions Precautions: Fall Precaution Comments:  (Left hemiparesis, vertigo ) Restrictions Weight Bearing Restrictions: No General:  Vital Signs: Therapy Vitals Pulse Rate: 62 BP: 134/64 Pain:  Pt with no c/o pain  See Function Navigator for Current Functional Status.   Therapy/Group: Individual Therapy  Simonne Come 03/25/2016, 11:04 AM

## 2016-03-25 NOTE — Progress Notes (Signed)
Subjective/Complaints: No new complaints. Feeling well.  ROS: pt denies nausea, vomiting, diarrhea, cough, shortness of breath or chest pain   Objective: Vital Signs: Blood pressure 128/68, pulse 68, temperature 98.6 F (37 C), temperature source Oral, resp. rate 16, height '5\' 7"'  (1.702 m), weight (!) 169.8 kg (374 lb 6.4 oz), SpO2 99 %. No results found. Results for orders placed or performed during the hospital encounter of 03/10/16 (from the past 72 hour(s))  Glucose, capillary     Status: Abnormal   Collection Time: 03/22/16 11:40 AM  Result Value Ref Range   Glucose-Capillary 113 (H) 65 - 99 mg/dL  Glucose, capillary     Status: Abnormal   Collection Time: 03/22/16  4:38 PM  Result Value Ref Range   Glucose-Capillary 100 (H) 65 - 99 mg/dL  Glucose, capillary     Status: Abnormal   Collection Time: 03/22/16  8:32 PM  Result Value Ref Range   Glucose-Capillary 115 (H) 65 - 99 mg/dL  Glucose, capillary     Status: Abnormal   Collection Time: 03/23/16  6:27 AM  Result Value Ref Range   Glucose-Capillary 106 (H) 65 - 99 mg/dL  Glucose, capillary     Status: None   Collection Time: 03/23/16 11:51 AM  Result Value Ref Range   Glucose-Capillary 93 65 - 99 mg/dL   Comment 1 Notify RN   Glucose, capillary     Status: Abnormal   Collection Time: 03/23/16  5:26 PM  Result Value Ref Range   Glucose-Capillary 102 (H) 65 - 99 mg/dL   Comment 1 Notify RN   Glucose, capillary     Status: Abnormal   Collection Time: 03/23/16  9:08 PM  Result Value Ref Range   Glucose-Capillary 122 (H) 65 - 99 mg/dL   Comment 1 Notify RN   Basic metabolic panel     Status: None   Collection Time: 03/24/16  5:30 AM  Result Value Ref Range   Sodium 140 135 - 145 mmol/L   Potassium 4.1 3.5 - 5.1 mmol/L   Chloride 109 101 - 111 mmol/L   CO2 24 22 - 32 mmol/L   Glucose, Bld 97 65 - 99 mg/dL   BUN 19 6 - 20 mg/dL   Creatinine, Ser 0.83 0.44 - 1.00 mg/dL   Calcium 9.2 8.9 - 10.3 mg/dL   GFR calc  non Af Amer >60 >60 mL/min   GFR calc Af Amer >60 >60 mL/min    Comment: (NOTE) The eGFR has been calculated using the CKD EPI equation. This calculation has not been validated in all clinical situations. eGFR's persistently <60 mL/min signify possible Chronic Kidney Disease.    Anion gap 7 5 - 15  CBC     Status: Abnormal   Collection Time: 03/24/16  5:30 AM  Result Value Ref Range   WBC 7.0 4.0 - 10.5 K/uL   RBC 4.04 3.87 - 5.11 MIL/uL   Hemoglobin 11.8 (L) 12.0 - 15.0 g/dL   HCT 38.7 36.0 - 46.0 %   MCV 95.8 78.0 - 100.0 fL   MCH 29.2 26.0 - 34.0 pg   MCHC 30.5 30.0 - 36.0 g/dL   RDW 14.2 11.5 - 15.5 %   Platelets 183 150 - 400 K/uL  Glucose, capillary     Status: Abnormal   Collection Time: 03/24/16  6:30 AM  Result Value Ref Range   Glucose-Capillary 101 (H) 65 - 99 mg/dL   Comment 1 Notify RN   Glucose, capillary  Status: Abnormal   Collection Time: 03/24/16 11:41 AM  Result Value Ref Range   Glucose-Capillary 112 (H) 65 - 99 mg/dL     General: NAD. Vital signs reviewed. Morbid obesity Heart: RRR. No JVD.  Lungs: Clear to auscultation, breathing Unlabored Abdomen: Positive bowel sounds, soft  Musculoskeletal: No edema. No tenderness Neurologic: Alert Motor 5/5 in bilateral deltoid, bicep, tricep, grip,4+/5 hip flexor, knee extensors, ankle dorsiflexor and plantar flexor Minimal LUE ataxia  Skin: No evidence of breakdown, no evidence of rash Psych: Mood and affect are appropriate   Assessment/Plan: 1. Functional deficits secondary to Left cerebellar infarct which require 3+ hours per day of interdisciplinary therapy in a comprehensive inpatient rehab setting. Physiatrist is providing close team supervision and 24 hour management of active medical problems listed below. Physiatrist and rehab team continue to assess barriers to discharge/monitor patient progress toward functional and medical goals. FIM: Function - Bathing Position: Wheelchair/chair at  sink Body parts bathed by patient: Right arm, Left arm, Chest, Abdomen, Right upper leg, Left upper leg, Front perineal area, Buttocks Body parts bathed by helper: Buttocks Bathing not applicable: Right lower leg, Left lower leg, Back Assist Level: Set up Assistive Device Comment: with long handled sponge Set up : To obtain items  Function- Upper Body Dressing/Undressing What is the patient wearing?: Pull over shirt/dress Pull over shirt/dress - Perfomed by patient: Thread/unthread right sleeve, Thread/unthread left sleeve, Put head through opening, Pull shirt over trunk Assist Level: More than reasonable time Set up : To obtain clothing/put away Function - Lower Body Dressing/Undressing What is the patient wearing?: Pants, Socks, Shoes, Underwear Position: Wheelchair/chair at sink Underwear - Performed by patient: Thread/unthread right underwear leg, Thread/unthread left underwear leg, Pull underwear up/down Underwear - Performed by helper: Pull underwear up/down Pants- Performed by patient: Thread/unthread right pants leg, Thread/unthread left pants leg, Pull pants up/down Pants- Performed by helper: Pull pants up/down Non-skid slipper socks- Performed by patient: Don/doff right sock, Don/doff left sock Non-skid slipper socks- Performed by helper: Don/doff right sock, Don/doff left sock Socks - Performed by patient: Don/doff right sock, Don/doff left sock Shoes - Performed by patient: Don/doff right shoe, Don/doff left shoe Shoes - Performed by helper: Fasten right, Fasten left Assist for footwear: Setup Assist for lower body dressing: Set up Assistive Device Comment: with use of shoe funnel Set up : To obtain clothing/put away  Function - Toileting Toileting steps completed by patient: Adjust clothing prior to toileting, Performs perineal hygiene, Adjust clothing after toileting Toileting steps completed by helper: Adjust clothing after toileting Toileting Assistive Devices: Grab  bar or rail Assist level: Set up/obtain supplies  Function - Toilet Transfers Toilet transfer assistive device: Elevated toilet seat/BSC over toilet, Grab bar Mechanical lift: Stedy Assist level to toilet: Supervision or verbal cues Assist level from toilet: Supervision or verbal cues  Function - Chair/bed transfer Chair/bed transfer method: Stand pivot, Ambulatory Chair/bed transfer assist level: Supervision or verbal cues Chair/bed transfer assistive device: Armrests, Walker Mechanical lift: Stedy Chair/bed transfer details: Manual facilitation for weight shifting, Verbal cues for safe use of DME/AE  Function - Locomotion: Wheelchair Will patient use wheelchair at discharge?: Yes Type: Manual Max wheelchair distance: 150 Assist Level: Supervision or verbal cues Assist Level: Supervision or verbal cues Wheel 150 feet activity did not occur: Safety/medical concerns Assist Level: Supervision or verbal cues Turns around,maneuvers to table,bed, and toilet,negotiates 3% grade,maneuvers on rugs and over doorsills: No Function - Locomotion: Ambulation Assistive device: Walker-rolling Max distance: 31 Assist level:  Moderate assist (Pt 50 - 74%) Assist level: Moderate assist (Pt 50 - 74%) Walk 50 feet with 2 turns activity did not occur: Safety/medical concerns Walk 150 feet activity did not occur: Safety/medical concerns Walk 10 feet on uneven surfaces activity did not occur: Safety/medical concerns  Function - Comprehension Comprehension: Auditory Comprehension assist level: Follows complex conversation/direction with no assist  Function - Expression Expression: Verbal Expression assist level: Expresses complex 90% of the time/cues < 10% of the time  Function - Social Interaction Social Interaction assist level: Interacts appropriately with others - No medications needed.  Function - Problem Solving Problem solving assist level: Solves basic problems with no assist  Function  - Memory Memory assist level: Requires cues to use assistive device Patient normally able to recall (first 3 days only): Current season, Location of own room, Staff names and faces, That he or she is in a hospital  Medical Problem List and Plan: 1. Ataxia and functional deficitssecondary to left cerebellar infarct  -Cont CIR therapies 2. DVT Prophylaxis/Anticoagulation: Pharmaceutical: Lovenox 3. Pain Management: N/A 4. Mood: Team to provide ego support. LCSW to follow for evaluation and support. Anxiety on Buspar and citalopram, gets dizzy with Buspar changed to PRN 5. Neuropsych: This patient iscapable of making decisions on herown behalf. Dr Sima Matas following for relaxation training 6. Skin/Wound Care: Routine pressure relief measures.  7. Fluids/Electrolytes/Nutrition: Monitor I/O.   BMP within acceptable range on 2/16  Encourage fluids    qHS snack 8. HTN: Monitor BP bid.   Resumed Lasix 2/9  Metoprolol 12.5 restarted 2/10 (home 50)  Controlled 2/16 9. OSA: Continue to encourage consistent use of CPAP 10. Morbid obesity: BMI 55.87. consult dietician to educate on CM/Heart healthy diet. Encourage appropriate diet and exercise to help promote weight loss to help promote health  12. Prediabetes: Hgb A1C-6.3. CM diet reinforced. Monitor BS ac/hs for trends.   Controlled 2/16 13. CAD s/p CABG: On ASA and Lipitor  14.  Constipation improved on senna S BID may reduce if BMs become excessive 15. Mild ABLA  Hb 11.8 on 2/16  Cont to monitor  LOS (Days) 15 A FACE TO FACE EVALUATION WAS PERFORMED  SWARTZ,ZACHARY T 03/25/2016, 9:54 AM

## 2016-03-25 NOTE — Progress Notes (Signed)
Physical Therapy Session Note  Patient Details  Name: Megan Atkinson MRN: [id number] Date of Birth: 11/27/1950  Today's Date: 03/25/2016 PT Individual Time: 1405-1500 PT Individual Time Calculation (min): 55 min   Short Term Goals: Week 3:  PT Short Term Goal 1 (Week 3): =LTGs due to ELOS  Skilled Therapeutic Interventions/Progress Updates:   Pt received sitting in WC and agreeable to PT  WC mobility through hall x 125ft with supervision assist and use of BUE and BLE for propulsion. Min cues for improved turning and decreased turning radius.   Gait in rehab gym x 70ft with supervision assist progressing to min assist for last 10ft. Patient required min cues for increased step length and improved breathing. 2 standing rest breaks to redusee anxiety.   Stair training for up/down 4 inch step with RW and min assist from PT for safety x 2. Moderate cues for step to gait pattern and AD management.    Car transfer completed  To small SUV height with RW and min assist to manage the LLE . Pt reports that son will be picking her up in [place]. PT provided extensive education for safety with car transfer at d/c and recommends using small SUV or sedan in order to decrease fall risk.   Patient returned to room and left sitting in WC with call bell in reach and all needs met.           Therapy Documentation Precautions:  Precautions Precautions: Fall Precaution Comments:  (Left hemiparesis, vertigo ) Restrictions Weight Bearing Restrictions: No Vital Signs: Therapy Vitals Temp: 97.6 F (36.4 C) Temp Source: Axillary Pulse Rate: 70 Resp: 16 BP: 136/60 Patient Position (if appropriate): Sitting Oxygen Therapy SpO2: 99 % O2 Device: Not Delivered Pain:0/10   See Function Navigator for Current Functional Status.   Therapy/Group: Individual Therapy  Austin E Tucker 03/25/2016, 3:48 PM  

## 2016-03-26 NOTE — Progress Notes (Signed)
Subjective/Complaints: Feeling well. Eating breakfast.   ROS: pt denies nausea, vomiting, diarrhea, cough, shortness of breath or chest pain   Objective: Vital Signs: Blood pressure 128/78, pulse 68, temperature 98.3 F (36.8 C), temperature source Oral, resp. rate 18, height '5\' 7"'  (1.702 m), weight (!) 169.8 kg (374 lb 6.4 oz), SpO2 99 %. No results found. Results for orders placed or performed during the hospital encounter of 03/10/16 (from the past 72 hour(s))  Glucose, capillary     Status: None   Collection Time: 03/23/16 11:51 AM  Result Value Ref Range   Glucose-Capillary 93 65 - 99 mg/dL   Comment 1 Notify RN   Glucose, capillary     Status: Abnormal   Collection Time: 03/23/16  5:26 PM  Result Value Ref Range   Glucose-Capillary 102 (H) 65 - 99 mg/dL   Comment 1 Notify RN   Glucose, capillary     Status: Abnormal   Collection Time: 03/23/16  9:08 PM  Result Value Ref Range   Glucose-Capillary 122 (H) 65 - 99 mg/dL   Comment 1 Notify RN   Basic metabolic panel     Status: None   Collection Time: 03/24/16  5:30 AM  Result Value Ref Range   Sodium 140 135 - 145 mmol/L   Potassium 4.1 3.5 - 5.1 mmol/L   Chloride 109 101 - 111 mmol/L   CO2 24 22 - 32 mmol/L   Glucose, Bld 97 65 - 99 mg/dL   BUN 19 6 - 20 mg/dL   Creatinine, Ser 0.83 0.44 - 1.00 mg/dL   Calcium 9.2 8.9 - 10.3 mg/dL   GFR calc non Af Amer >60 >60 mL/min   GFR calc Af Amer >60 >60 mL/min    Comment: (NOTE) The eGFR has been calculated using the CKD EPI equation. This calculation has not been validated in all clinical situations. eGFR's persistently <60 mL/min signify possible Chronic Kidney Disease.    Anion gap 7 5 - 15  CBC     Status: Abnormal   Collection Time: 03/24/16  5:30 AM  Result Value Ref Range   WBC 7.0 4.0 - 10.5 K/uL   RBC 4.04 3.87 - 5.11 MIL/uL   Hemoglobin 11.8 (L) 12.0 - 15.0 g/dL   HCT 38.7 36.0 - 46.0 %   MCV 95.8 78.0 - 100.0 fL   MCH 29.2 26.0 - 34.0 pg   MCHC 30.5  30.0 - 36.0 g/dL   RDW 14.2 11.5 - 15.5 %   Platelets 183 150 - 400 K/uL  Glucose, capillary     Status: Abnormal   Collection Time: 03/24/16  6:30 AM  Result Value Ref Range   Glucose-Capillary 101 (H) 65 - 99 mg/dL   Comment 1 Notify RN   Glucose, capillary     Status: Abnormal   Collection Time: 03/24/16 11:41 AM  Result Value Ref Range   Glucose-Capillary 112 (H) 65 - 99 mg/dL     General: NAD. Vital signs reviewed. Morbid obesity Heart: RRR.  Lungs: Clear to auscultation, breathing Unlabored Abdomen: Positive bowel sounds, soft  Musculoskeletal: No edema. No tenderness Neurologic: Alert Motor 5/5 in bilateral deltoid, bicep, tricep, grip,4+/5 hip flexor, knee extensors, ankle dorsiflexor and plantar flexor Minimal LUE ataxia  Skin: No evidence of breakdown, no evidence of rash Psych: Mood and affect are appropriate   Assessment/Plan: 1. Functional deficits secondary to Left cerebellar infarct which require 3+ hours per day of interdisciplinary therapy in a comprehensive inpatient rehab setting. Physiatrist is  providing close team supervision and 24 hour management of active medical problems listed below. Physiatrist and rehab team continue to assess barriers to discharge/monitor patient progress toward functional and medical goals. FIM: Function - Bathing Position: Wheelchair/chair at sink Body parts bathed by patient: Right arm, Left arm, Chest, Abdomen, Right upper leg, Left upper leg, Front perineal area, Buttocks Body parts bathed by helper: Buttocks Bathing not applicable: Right lower leg, Left lower leg, Back Assist Level: Set up Assistive Device Comment: with long handled sponge Set up : To obtain items  Function- Upper Body Dressing/Undressing What is the patient wearing?: Pull over shirt/dress Pull over shirt/dress - Perfomed by patient: Thread/unthread right sleeve, Thread/unthread left sleeve, Put head through opening, Pull shirt over trunk Assist Level:  More than reasonable time Set up : To obtain clothing/put away Function - Lower Body Dressing/Undressing What is the patient wearing?: Pants, Socks, Shoes, Underwear Position: Wheelchair/chair at sink Underwear - Performed by patient: Thread/unthread right underwear leg, Thread/unthread left underwear leg, Pull underwear up/down Underwear - Performed by helper: Pull underwear up/down Pants- Performed by patient: Thread/unthread right pants leg, Thread/unthread left pants leg, Pull pants up/down Pants- Performed by helper: Pull pants up/down Non-skid slipper socks- Performed by patient: Don/doff right sock, Don/doff left sock Non-skid slipper socks- Performed by helper: Don/doff right sock, Don/doff left sock Socks - Performed by patient: Don/doff right sock, Don/doff left sock Shoes - Performed by patient: Don/doff right shoe, Don/doff left shoe Shoes - Performed by helper: Fasten right, Fasten left Assist for footwear: Setup Assist for lower body dressing: Set up Assistive Device Comment: with use of shoe funnel Set up : To obtain clothing/put away  Function - Toileting Toileting steps completed by patient: Adjust clothing prior to toileting, Performs perineal hygiene, Adjust clothing after toileting Toileting steps completed by helper: Adjust clothing after toileting Toileting Assistive Devices: Grab bar or rail Assist level: Set up/obtain supplies  Function - Toilet Transfers Toilet transfer assistive device: Elevated toilet seat/BSC over toilet, Grab bar Mechanical lift: Stedy Assist level to toilet: Supervision or verbal cues Assist level from toilet: Supervision or verbal cues  Function - Chair/bed transfer Chair/bed transfer method: Stand pivot Chair/bed transfer assist level: Supervision or verbal cues Chair/bed transfer assistive device: Armrests, Walker Mechanical lift: Stedy Chair/bed transfer details: Manual facilitation for weight shifting, Verbal cues for safe use of  DME/AE  Function - Locomotion: Wheelchair Will patient use wheelchair at discharge?: Yes Type: Manual Max wheelchair distance: 125f Assist Level: Supervision or verbal cues Assist Level: Supervision or verbal cues Wheel 150 feet activity did not occur: Safety/medical concerns Assist Level: Supervision or verbal cues Turns around,maneuvers to table,bed, and toilet,negotiates 3% grade,maneuvers on rugs and over doorsills: No Function - Locomotion: Ambulation Assistive device: Walker-rolling Max distance: 794f Assist level: Touching or steadying assistance (Pt > 75%) Assist level: Supervision or verbal cues Walk 50 feet with 2 turns activity did not occur: Safety/medical concerns Assist level: Supervision or verbal cues Walk 150 feet activity did not occur: Safety/medical concerns Walk 10 feet on uneven surfaces activity did not occur: Safety/medical concerns  Function - Comprehension Comprehension: Auditory Comprehension assist level: Follows complex conversation/direction with no assist  Function - Expression Expression: Verbal Expression assist level: Expresses basic 90% of the time/requires cueing < 10% of the time., Expresses complex 90% of the time/cues < 10% of the time  Function - Social Interaction Social Interaction assist level: Interacts appropriately with others - No medications needed.  Function - Problem Solving Problem  solving assist level: Solves basic problems with no assist  Function - Memory Memory assist level: Recognizes or recalls 90% of the time/requires cueing < 10% of the time Patient normally able to recall (first 3 days only): Current season, Location of own room, Staff names and faces, That he or she is in a hospital  Medical Problem List and Plan: 1. Ataxia and functional deficitssecondary to left cerebellar infarct  -Cont CIR therapies 2. DVT Prophylaxis/Anticoagulation: Pharmaceutical: Lovenox 3. Pain Management: N/A 4. Mood:  Team to provide ego support. LCSW to follow for evaluation and support. Anxiety on Buspar and citalopram, gets dizzy with Buspar--changed to PRN 5. Neuropsych: This patient iscapable of making decisions on herown behalf. Dr Sima Matas following for relaxation training 6. Skin/Wound Care: Routine pressure relief measures.  7. Fluids/Electrolytes/Nutrition: has good appetite  BMP within acceptable range on 2/16  Encourage fluids    qHS snack 8. HTN: Monitor BP bid.   Resumed Lasix 2/9  Metoprolol 12.5 restarted 2/10 (home 50)  Controlled 2/16 9. OSA: Continue to encourage consistent use of CPAP 10. Morbid obesity: BMI 55.87. consult dietician to educate on CM/Heart healthy diet. Encourage appropriate diet and exercise to help promote weight loss to help promote health  12. Prediabetes: Hgb A1C-6.3. CM diet reinforced. Monitor BS ac/hs for trends.   Controlled 2/16 13. CAD s/p CABG: On ASA and Lipitor  14.  Constipation improved on senna S BID may reduce if BMs become excessive 15. Mild ABLA  Hb 11.8 on 2/16  Cont to monitor  LOS (Days) 16 A FACE TO FACE EVALUATION WAS PERFORMED  Megan Atkinson T 03/26/2016, 9:33 AM

## 2016-03-27 ENCOUNTER — Inpatient Hospital Stay (HOSPITAL_COMMUNITY): Payer: Medicare Other | Admitting: Physical Therapy

## 2016-03-27 ENCOUNTER — Inpatient Hospital Stay (HOSPITAL_COMMUNITY): Payer: Medicare Other | Admitting: Occupational Therapy

## 2016-03-27 LAB — GLUCOSE, CAPILLARY
GLUCOSE-CAPILLARY: 145 mg/dL — AB (ref 65–99)
Glucose-Capillary: 98 mg/dL (ref 65–99)
Glucose-Capillary: 127 mg/dL — ABNORMAL HIGH (ref 65–99)

## 2016-03-27 NOTE — Progress Notes (Signed)
Social Work Patient ID: Megan NielsenBeverley Atkinson, female   DOB: 01/10/1951, 66 y.o.   MRN: [id number] Long discussion with pt and spoke with son-Lamont via telephone to discuss the barriers to discharge tomorrow and concerns Team has regarding her going home alone. Pt feels she can manage and wants to go home tomorrow. She states: " Whatever happen to the patient has a say." Encouraged her to contact here son. She reports he has Hired someone to be there with her. Son has not done this. He feels Mom just wants to get home and somehow manage. Discussed insurance has given until Friday and this way could get more therapy and hopefully make More progress, since she did well last week with her progress. Son reports his father can not assist pt he is very small and has many health issues. Will need to train the person who is transporting pt home prior to discharge. Pt is not happy with the plan at the current time.

## 2016-03-27 NOTE — Progress Notes (Signed)
Social Work Patient ID: Megan Atkinson, female   DOB: [birth year], 66 y.o.   MRN: [id number]  Met with pt again and son-Lamont to discuss what team has decided. Pt and team agreeable to Friday discharge  Which she will be at a consistent supervision level. Pt feels she will not agree to stay any longer than this she feels she will be mobile and reports she has moved alone in her room and done well. Aware the team  Does not feel she is safe yet doing this. All equipment here for home. Son to get back with worker to schedule family education on day of discharge, he needs to look at his surgery schedule.

## 2016-03-27 NOTE — Progress Notes (Signed)
Subjective/Complaints: No new complaints. Feeling well.  ROS: pt denies nausea, vomiting, diarrhea, cough, shortness of breath or chest pain   Objective: Vital Signs: Blood pressure 128/78, pulse 88, temperature 97.9 F (36.6 C), temperature source Oral, resp. rate 16, height 5\' 7"  (1.702 m), weight (!) 169.8 kg (374 lb 6.4 oz), SpO2 99 %. No results found. Results for orders placed or performed during the hospital encounter of 03/10/16 (from the past 72 hour(s))  Glucose, capillary     Status: Abnormal   Collection Time: 03/24/16 11:41 AM  Result Value Ref Range   Glucose-Capillary 112 (H) 65 - 99 mg/dL     General: NAD. Vital signs reviewed. Morbid obesity Heart: RRR. No JVD.  Lungs: Clear to auscultation, breathing Unlabored Abdomen: Positive bowel sounds, soft  Musculoskeletal: No edema. No tenderness Neurologic: Alert Motor 5/5 in bilateral deltoid, bicep, tricep, grip,4+/5 hip flexor, knee extensors, ankle dorsiflexor and plantar flexor Minimal LUE ataxia  Skin: No evidence of breakdown, no evidence of rash Psych: Mood and affect are appropriate   Assessment/Plan: 1. Functional deficits secondary to Left cerebellar infarct which require 3+ hours per day of interdisciplinary therapy in a comprehensive inpatient rehab setting. Physiatrist is providing close team supervision and 24 hour management of active medical problems listed below. Physiatrist and rehab team continue to assess barriers to discharge/monitor patient progress toward functional and medical goals. FIM: Function - Bathing Position: Wheelchair/chair at sink Body parts bathed by patient: Right arm, Left arm, Chest, Abdomen, Right upper leg, Left upper leg, Front perineal area, Buttocks Body parts bathed by helper: Buttocks Bathing not applicable: Right lower leg, Left lower leg, Back Assist Level: Set up Assistive Device Comment: with long handled sponge Set up : To obtain items  Function- Upper Body  Dressing/Undressing What is the patient wearing?: Pull over shirt/dress Pull over shirt/dress - Perfomed by patient: Thread/unthread right sleeve, Thread/unthread left sleeve, Put head through opening, Pull shirt over trunk Assist Level: More than reasonable time Set up : To obtain clothing/put away Function - Lower Body Dressing/Undressing What is the patient wearing?: Pants, Socks, Shoes, Underwear Position: Wheelchair/chair at sink Underwear - Performed by patient: Thread/unthread right underwear leg, Thread/unthread left underwear leg, Pull underwear up/down Underwear - Performed by helper: Pull underwear up/down Pants- Performed by patient: Thread/unthread right pants leg, Thread/unthread left pants leg, Pull pants up/down Pants- Performed by helper: Pull pants up/down Non-skid slipper socks- Performed by patient: Don/doff right sock, Don/doff left sock Non-skid slipper socks- Performed by helper: Don/doff right sock, Don/doff left sock Socks - Performed by patient: Don/doff right sock, Don/doff left sock Shoes - Performed by patient: Don/doff right shoe, Don/doff left shoe Shoes - Performed by helper: Fasten right, Fasten left Assist for footwear: Setup Assist for lower body dressing: Set up Assistive Device Comment: with use of shoe funnel Set up : To obtain clothing/put away  Function - Toileting Toileting steps completed by patient: Adjust clothing prior to toileting, Performs perineal hygiene, Adjust clothing after toileting Toileting steps completed by helper: Adjust clothing after toileting Toileting Assistive Devices: Grab bar or rail Assist level: Set up/obtain supplies  Function - Toilet Transfers Toilet transfer assistive device: Elevated toilet seat/BSC over toilet, Grab bar Mechanical lift: Stedy Assist level to toilet: Supervision or verbal cues Assist level from toilet: Supervision or verbal cues  Function - Chair/bed transfer Chair/bed transfer method: Stand  pivot Chair/bed transfer assist level: Supervision or verbal cues Chair/bed transfer assistive device: Armrests, Walker Mechanical lift: Stedy Chair/bed transfer details:  Manual facilitation for weight shifting, Verbal cues for safe use of DME/AE  Function - Locomotion: Wheelchair Will patient use wheelchair at discharge?: Yes Type: Manual Max wheelchair distance: 16425ft Assist Level: Supervision or verbal cues Assist Level: Supervision or verbal cues Wheel 150 feet activity did not occur: Safety/medical concerns Assist Level: Supervision or verbal cues Turns around,maneuvers to table,bed, and toilet,negotiates 3% grade,maneuvers on rugs and over doorsills: No Function - Locomotion: Ambulation Assistive device: Walker-rolling Max distance: 2070ft  Assist level: Touching or steadying assistance (Pt > 75%) Assist level: Supervision or verbal cues Walk 50 feet with 2 turns activity did not occur: Safety/medical concerns Assist level: Supervision or verbal cues Walk 150 feet activity did not occur: Safety/medical concerns Walk 10 feet on uneven surfaces activity did not occur: Safety/medical concerns  Function - Comprehension Comprehension: Auditory Comprehension assist level: Follows complex conversation/direction with no assist  Function - Expression Expression: Verbal Expression assist level: Expresses basic needs/ideas: With no assist  Function - Social Interaction Social Interaction assist level: Interacts appropriately with others - No medications needed.  Function - Problem Solving Problem solving assist level: Solves basic problems with no assist  Function - Memory Memory assist level: Recognizes or recalls 90% of the time/requires cueing < 10% of the time Patient normally able to recall (first 3 days only): Current season, Location of own room, Staff names and faces, That he or she is in a hospital  Medical Problem List and Plan: 1. Ataxia and functional  deficitssecondary to left cerebellar infarct  -Cont CIR therapies, plan d/c in am 2. DVT Prophylaxis/Anticoagulation: Pharmaceutical: Lovenox 3. Pain Management: N/A 4. Mood: Team to provide ego support. LCSW to follow for evaluation and support. Anxiety on Buspar and citalopram, gets dizzy with Buspar changed to PRN 5. Neuropsych: This patient iscapable of making decisions on herown behalf. Dr Kieth Brightlyodenbough following for relaxation training- ?OP f/u 6. Skin/Wound Care: Routine pressure relief measures.  7. Fluids/Electrolytes/Nutrition: Monitor I/O.   BMP within acceptable range on 2/16  Encourage fluids    qHS snack 8. HTN: Monitor BP bid.  Vitals:   03/26/16 1400 03/27/16 0543  BP: 135/75 128/78  Pulse: 70 88  Resp: 18 16  Temp: 97.6 F (36.4 C) 97.9 F (36.6 C)    Resumed Lasix 2/9  Metoprolol 12.5 restarted 2/10 (home 50)  Controlled 2/19 9. OSA: Continue to encourage consistent use of CPAP 10. Morbid obesity: BMI 55.87. consult dietician to educate on CM/Heart healthy diet. Encourage appropriate diet and exercise to help promote weight loss to help promote health  12. Prediabetes: Hgb A1C-6.3. CM diet reinforced. Monitor BS ac/hs for trends.   Controlled 2/16 13. CAD s/p CABG: On ASA and Lipitor  14.  Constipation improved on senna S BID 15. Mild ABLA  Hb 11.8 on 2/16  F/u OP labs at PCP  LOS (Days) 17 A FACE TO FACE EVALUATION WAS PERFORMED  Megan Atkinson E 03/27/2016, 8:24 AM

## 2016-03-27 NOTE — Progress Notes (Signed)
Physical Therapy Session Note  Patient Details  Name: Megan Atkinson MRN: [id number] Date of Birth: [birth year]  Today's Date: 03/27/2016 PT Individual Time: 0900-0959 PT Individual Time Calculation (min): 59 min   Short Term Goals: Week 3:  PT Short Term Goal 1 (Week 3): =LTGs due to ELOS  Skilled Therapeutic Interventions/Progress Updates:    No c/o pain, pt very upset that she will not be discharging tomorrow.  Extensive education provided regarding goals of therapy at supervision>min assist level, need for family to be present for education, and family not being present in therapy this weekend (as was planned).  Spent time discussing barriers to d/c with pt including home entry (steps with no rail), w/c not fitting through doorways, plan for son to bring pt's [facility] as of Saturday, and pt's ongoing need for close supervision from therapist which will translate to hands on assist for family.  Pt did not verbalize understanding to all of this, simply states "but I'm ready, what about what I want?"  Reoriented pt to goals of therapy and desire of rehab team for pt to have a safe d/c plan.    Remainder of session focus on strengthening, gait training, and transfers.  PT instructs pt in stair negotiation 2 trials of 4 steps with 2 rails (seated rest break in between) for strengthening and balance.  Discussed progression of stair training for home access once home with HHPT.  Gait training x55' with RW and close supervision with min cues for pacing, step length, and slowing down with turning to sit.  Stand/pivot at end of session to L with supervision and cues for L step length.  Pt returned to room at end of session and positioned with call bell in reach and needs met.   Therapy Documentation Precautions:  Precautions Precautions: Fall Precaution Comments:  (Left hemiparesis, vertigo ) Restrictions Weight Bearing Restrictions: No   See Function Navigator for Current Functional  Status.   Therapy/Group: Individual Therapy  Marcelle Hepner E Penven-Crew 03/27/2016, 10:01 AM

## 2016-03-27 NOTE — Progress Notes (Signed)
Occupational Therapy Session Note  Patient Details  Name: Megan Atkinson MRN: [id number] Date of Birth: 1950/08/15  Today's Date: 03/27/2016 OT Individual Time:  -  4403-4742  (75 min)  1st session                                          1500-1530  (30 min)  2nd session      Short Term Goals: Week 1:  OT Short Term Goal 1 (Week 1): Pt will complete toilet transfer with LRAD and Max A OT Short Term Goal 1 - Progress (Week 1): Met OT Short Term Goal 2 (Week 1): Pt will don socks with use of sock aide and supervision  OT Short Term Goal 2 - Progress (Week 1): Met OT Short Term Goal 3 (Week 1): Pt will complete sit<stand for LB dressing with Max A at sink or with RW OT Short Term Goal 3 - Progress (Week 1): Met OT Short Term Goal 4 (Week 1): Pt will complete bathing with Min A with AE as needed  OT Short Term Goal 4 - Progress (Week 1): Met Week 2:  OT Short Term Goal 1 (Week 2): Pt will complete toilet transfers with supervision with RW OT Short Term Goal 1 - Progress (Week 2): Partly met OT Short Term Goal 2 (Week 2): Pt will complete LB dressing at sit > stand level with supervision/setup OT Short Term Goal 2 - Progress (Week 2): Met OT Short Term Goal 3 (Week 2): Pt will complete 2 grooming tasks in standing to increase standing tolerance/balance OT Short Term Goal 3 - Progress (Week 2): Met  Skilled Therapeutic Interventions/Progress Updates:    l1st session:  Treatment session with focus on ADL retraining, functional transfers, standing balance without UE support during activities, PPt ambulated to bathroom with RW and SBA.  Performed Hygiene and clothing management with RW in front for UE support. Ambulated back to sink and stood to wash hands.  Propelled wc 30 feet before stopping to rest.  Went to OT kitchen.  Performed one item meal prep using microwave.  Pt was able to complete task with 3 sit rest breaks needed.  She had one lateral lean when passing item from one counter to  another, but she was able to regain.  Propelled wc 50 feet back to room with no assistance.  OT pushed pt the rest of the way.  Pt has bariatric 3n1 in room to take home.  Discharge has been extended to Friday, February 23.    2nd session:  Engaged in standing balance using Tai chi forms.  Practiced weight shifting and bending knees slightly while performing forms.  Ppt required cues for taking small steps. Performed 5 sets of 5 reps.  Pt  Cued to take small steps and weight shift fully before taking step.  Pt left in room in wc and all needs in reach.     Therapy Documentation Precautions:  Precautions Precautions: Fall Precaution Comments:  (Left hemiparesis, vertigo ) Restrictions Weight Bearing Restrictions: No General:   Vital Signs: Therapy Vitals Temp: 97.9 F (36.6 C) Temp Source: Oral Pulse Rate: 88 Resp: 16 BP: 128/78 Patient Position (if appropriate): Lying Oxygen Therapy SpO2: 99 % O2 Device: Not Delivered Pain:   ADL: ADL ADL Comments: Please see functional navigator for ADL status Exercises:   Other Treatments:    See Function Navigator  for Current Functional Status.   Therapy/Group: Individual Therapy  Lisa Roca 03/27/2016, 7:56 AM

## 2016-03-27 NOTE — Progress Notes (Signed)
Physical Therapy Session Note  Patient Details  Name: Megan Atkinson MRN: [id number] Date of Birth: [birth year]  Today's Date: 03/27/2016 PT Individual Time: 1600-1630 PT Individual Time Calculation (min): 30 min   Short Term Goals: Week 3:  PT Short Term Goal 1 (Week 3): =LTGs due to ELOS  Skilled Therapeutic Interventions/Progress Updates:    no c/o pain, pt in better spirits this PM.  Session focus on activity tolerance via w/c propulsion x150' and gait training x75' with RW.  Pt significantly fatigued this PM and requiring min assist for gait with one major LOB forward/L requiring pt to stabilize on rail in hallway and mod assist from therapist to recover.  Pt also requiring increased number of standing rest breaks during ambulation this PM.  Pt education regarding energy conservation and pacing with activity throughout the day and she verbalized understanding.  Returned to room at end of session and stand/pivot to toilet supervision.  Pt agrees to call NT for assist when ready to stand up.   Therapy Documentation Precautions:  Precautions Precautions: Fall Precaution Comments:  (Left hemiparesis, vertigo ) Restrictions Weight Bearing Restrictions: No    See Function Navigator for Current Functional Status.   Therapy/Group: Individual Therapy  Ladora DanielCaitlin E Penven-Crew 03/27/2016, 4:48 PM

## 2016-03-28 ENCOUNTER — Inpatient Hospital Stay (HOSPITAL_COMMUNITY): Payer: Medicare Other | Admitting: Physical Therapy

## 2016-03-28 ENCOUNTER — Inpatient Hospital Stay (HOSPITAL_COMMUNITY): Payer: Medicare Other | Admitting: Occupational Therapy

## 2016-03-28 LAB — GLUCOSE, CAPILLARY: Glucose-Capillary: 91 mg/dL (ref 65–99)

## 2016-03-28 NOTE — Progress Notes (Signed)
Occupational Therapy Session Note  Patient Details  Name: Megan NielsenBeverley Siefker MRN: [id number] Date of Birth: [birth year]  Today's Date: 03/28/2016 OT Individual Time: [id number] and 1400-1457 OT Individual Time Calculation (min): 83 min and 57 min   Short Term Goals: Week 3:  OT Short Term Goal 1 (Week 3): STG = LTGs due to remaining LOS  Skilled Therapeutic Interventions/Progress Updates:    1) Treatment session with focus on functional transfers, dynamic standing balance, and weight shifting during functional tasks to increase balance reactions and safety awareness.  Pt reports already completing bathing and dressing without supervision/assist.  Re-educated on goals for supervision/setup to increase safety with self-care tasks.  Completed toilet transfers in ADL apt with RW x2, pt ambulating approx 12-15 feet with RW and min guard to/from toilet incorporating turns and sit <> stand. Min assist for balance when returning to w/c due to tight space at doorway (as pt's w/c will not fit into bathroom).  Engaged in activity to address standing tolerance and balance reactions, incorporating alternating steps on 3" step for weight shift and increased safety awareness.  Pt required min assist for balance during alternating weight shifts with decreased stance time with Rt toe taps due to instability in LLE and fear of LLE buckling.  Pt with 1 LOB to Lt during toe taps requiring max assist to correct and lower to sitting on mat.    2) Treatment session with focus on overall activity tolerance, strengthening, and functional mobility.  Pt propelled w/c approx 50 feet with BUE and BLE for strengthening and endurance as pt will be primarily w/c level at home.  Engaged in ambulation outside on uneven sidewalk to simulate home entry and community for further challenge.  Pt ambulated 12 feet x3 with RW and min guard, then 20 feet with RW and min guard.  Completed transfers on/off bench outside hospital entrance to simulate  community setting, with pt requiring increased time to stand from lower surface, however still able to achieve without physical assist only cues for encouragement.  Pt propelled w/c over community setting up slight incline requiring increased time and effort due to incline.  Returned to room and completed stand pivot transfer w/c <> toilet with use of grab bar and supervision.  Pt able to complete clothing management and hygiene with supervision.  Therapy Documentation Precautions:  Precautions Precautions: Fall Precaution Comments:  (Left hemiparesis, vertigo ) Restrictions Weight Bearing Restrictions: No Pain:  Pt with no c/o pain ADL: ADL ADL Comments: Please see functional navigator for ADL status  See Function Navigator for Current Functional Status.   Therapy/Group: Individual Therapy  Rosalio LoudHOXIE, Renesmee Raine 03/28/2016, 12:29 PM

## 2016-03-28 NOTE — Progress Notes (Signed)
Subjective/Complaints:   ROS: pt denies nausea, vomiting, diarrhea, cough, shortness of breath or chest pain   Objective: Vital Signs: Blood pressure (!) 106/49, pulse 74, temperature 98.8 F (37.1 C), temperature source Oral, resp. rate 18, height 5\' 7"  (1.702 m), weight (!) 169.8 kg (374 lb 6.4 oz), SpO2 97 %. No results found. Results for orders placed or performed during the hospital encounter of 03/10/16 (from the past 72 hour(s))  Glucose, capillary     Status: Abnormal   Collection Time: 03/26/16  4:46 AM  Result Value Ref Range   Glucose-Capillary 127 (H) 65 - 99 mg/dL  Glucose, capillary     Status: Abnormal   Collection Time: 03/27/16  4:59 AM  Result Value Ref Range   Glucose-Capillary 145 (H) 65 - 99 mg/dL     General: NAD. Vital signs reviewed. Morbid obesity Heart: RRR. No JVD.  Lungs: Clear to auscultation, breathing Unlabored Abdomen: Positive bowel sounds, soft  Musculoskeletal: No edema. No tenderness Neurologic: Alert Motor 5/5 in bilateral deltoid, bicep, tricep, grip,4+/5 hip flexor, knee extensors, ankle dorsiflexor and plantar flexor Minimal LUE ataxia  Skin: No evidence of breakdown, no evidence of rash Psych: Mood and affect are appropriate   Assessment/Plan: 1. Functional deficits secondary to Left cerebellar infarct which require 3+ hours per day of interdisciplinary therapy in a comprehensive inpatient rehab setting. Physiatrist is providing close team supervision and 24 hour management of active medical problems listed below. Physiatrist and rehab team continue to assess barriers to discharge/monitor patient progress toward functional and medical goals. FIM: Function - Bathing Position: Wheelchair/chair at sink Body parts bathed by patient: Right arm, Left arm, Chest, Abdomen, Right upper leg, Left upper leg, Front perineal area, Buttocks Body parts bathed by helper: Buttocks Bathing not applicable: Right lower leg, Left lower leg,  Back Assist Level: Set up Assistive Device Comment: with long handled sponge Set up : To obtain items  Function- Upper Body Dressing/Undressing What is the patient wearing?: Pull over shirt/dress Pull over shirt/dress - Perfomed by patient: Thread/unthread right sleeve, Thread/unthread left sleeve, Put head through opening, Pull shirt over trunk Assist Level: More than reasonable time Set up : To obtain clothing/put away Function - Lower Body Dressing/Undressing What is the patient wearing?: Pants, Socks, Shoes, Underwear Position: Wheelchair/chair at sink Underwear - Performed by patient: Thread/unthread right underwear leg, Thread/unthread left underwear leg, Pull underwear up/down Underwear - Performed by helper: Pull underwear up/down Pants- Performed by patient: Thread/unthread right pants leg, Thread/unthread left pants leg, Pull pants up/down Pants- Performed by helper: Pull pants up/down Non-skid slipper socks- Performed by patient: Don/doff right sock, Don/doff left sock Non-skid slipper socks- Performed by helper: Don/doff right sock, Don/doff left sock Socks - Performed by patient: Don/doff right sock, Don/doff left sock Shoes - Performed by patient: Don/doff right shoe, Don/doff left shoe Shoes - Performed by helper: Fasten right, Fasten left Assist for footwear: Setup Assist for lower body dressing: Set up Assistive Device Comment: with use of shoe funnel Set up : To obtain clothing/put away  Function - Toileting Toileting steps completed by patient: Adjust clothing prior to toileting, Performs perineal hygiene, Adjust clothing after toileting Toileting steps completed by helper: Adjust clothing prior to toileting, Performs perineal hygiene, Adjust clothing after toileting Toileting Assistive Devices: Grab bar or rail Assist level: Set up/obtain supplies, Touching or steadying assistance (Pt.75%)  Function - Toilet Transfers Toilet transfer assistive device: Elevated  toilet seat/BSC over toilet, Grab bar Mechanical lift: Stedy Assist level to toilet:  Supervision or verbal cues Assist level from toilet: Supervision or verbal cues  Function - Chair/bed transfer Chair/bed transfer method: Stand pivot Chair/bed transfer assist level: Supervision or verbal cues Chair/bed transfer assistive device: Armrests, Walker Mechanical lift: Stedy Chair/bed transfer details: Manual facilitation for weight shifting, Verbal cues for safe use of DME/AE  Function - Locomotion: Wheelchair Will patient use wheelchair at discharge?: Yes Type: Manual Max wheelchair distance: 17625ft Assist Level: Supervision or verbal cues Assist Level: Supervision or verbal cues Wheel 150 feet activity did not occur: Safety/medical concerns Assist Level: Supervision or verbal cues Turns around,maneuvers to table,bed, and toilet,negotiates 3% grade,maneuvers on rugs and over doorsills: No Function - Locomotion: Ambulation Assistive device: Walker-rolling Max distance: 55 Assist level: Supervision or verbal cues Assist level: Supervision or verbal cues Walk 50 feet with 2 turns activity did not occur: Safety/medical concerns Assist level: Supervision or verbal cues Walk 150 feet activity did not occur: Safety/medical concerns Walk 10 feet on uneven surfaces activity did not occur: Safety/medical concerns  Function - Comprehension Comprehension: Auditory Comprehension assist level: Follows complex conversation/direction with no assist  Function - Expression Expression: Verbal Expression assist level: Expresses basic needs/ideas: With no assist  Function - Social Interaction Social Interaction assist level: Interacts appropriately with others with medication or extra time (anti-anxiety, antidepressant).  Function - Problem Solving Problem solving assist level: Solves complex problems: Recognizes & self-corrects  Function - Memory Memory assist level: Recognizes or recalls 90% of  the time/requires cueing < 10% of the time Patient normally able to recall (first 3 days only): Current season, Location of own room, Staff names and faces, That he or she is in a hospital  Medical Problem List and Plan: 1. Ataxia and functional deficitssecondary to left cerebellar infarct  -Cont CIR therapies, plan d/c Friday, team conf in am 2. DVT Prophylaxis/Anticoagulation: Pharmaceutical: Lovenox 3. Pain Management: N/A 4. Mood: Team to provide ego support. LCSW to follow for evaluation and support. Anxiety on Buspar and citalopram, gets dizzy with Buspar changed to PRN 5. Neuropsych: This patient iscapable of making decisions on herown behalf. Dr Kieth Brightlyodenbough following for relaxation training- ?OP f/u 6. Skin/Wound Care: Routine pressure relief measures.  7. Fluids/Electrolytes/Nutrition: Monitor I/O.   BMP within acceptable range on 2/16  Encourage fluids    qHS snack 8. HTN: Monitor BP bid. Running a little low no dizziness Vitals:   03/27/16 2050 03/28/16 0500  BP:  (!) 106/49  Pulse: 87 74  Resp: (!) 24 18  Temp:  98.8 F (37.1 C)    Resumed Lasix 2/9  Metoprolol 12.5 restarted 2/10 (home 50)  Controlled 2/19 9. OSA: Continue to encourage consistent use of CPAP 10. Morbid obesity: BMI 55.87. consult dietician to educate on CM/Heart healthy diet. Encourage appropriate diet and exercise to help promote weight loss to help promote health  12. Prediabetes: Hgb A1C-6.3. CM diet reinforced. Monitor BS ac/hs for trends.   13. CAD s/p CABG: On ASA and Lipitor  14.  Constipation improved on senna S BID, BM x 2 yesterday continent 15. Mild ABLA  Hb 11.8 on 2/16  F/u OP labs at PCP  LOS (Days) 18 A FACE TO FACE EVALUATION WAS PERFORMED  Bran Aldridge E 03/28/2016, 7:57 AM

## 2016-03-28 NOTE — Progress Notes (Signed)
Physical Therapy Session Note  Patient Details  Name: Megan Atkinson MRN: [id number] Date of Birth: 07/08/1950  Today's Date: 03/28/2016 PT Individual Time: 0900-0959 PT Individual Time Calculation (min): 59 min   Short Term Goals: Week 1:  PT Short Term Goal 1 (Week 1): Pt will demonstrate sit-to-stand transfers Min A with RW consistently PT Short Term Goal 1 - Progress (Week 1): Progressing toward goal PT Short Term Goal 2 (Week 1): Pt will ambulate 25 ft Mod A with RW  PT Short Term Goal 2 - Progress (Week 1): Not met PT Short Term Goal 3 (Week 1): Pt will perform stand pivot transfer Mod A with RW consistently PT Short Term Goal 3 - Progress (Week 1): Progressing toward goal PT Short Term Goal 4 (Week 1): Pt will initiate car transfer training PT Short Term Goal 4 - Progress (Week 1): Not met  Skilled Therapeutic Interventions/Progress Updates:    no c/o pain.  Session focus on strengthening, activity tolerance, transfers, gait training, and step negotiation.    Pt propels w/c throughout unit with BUEs/BLEs for cardiovascular endurance and mobility.  Sit<>stand throughout session with supervision, occasional need for PT to steady chair/couch when pt transferring due to LE push back.  Pt transfers from a variety of surfaces with overall supervision, only requiring tactile cues for forward weight shift when transferring up from low couch.  Gait training x35' + 20' + 10' with RW and close supervision, and supervision>min guard when turning to sit.  Pt continues to demo improving safety awareness and able to self correct foot placement and midline orientation without cues.  PT instructs pt in 1 step down to simulate entry into living space at home with 180 degree turn to sit in w/c with steady assist.  Discussed set up of places to sit throughout her living space as "rest stops" in case she needs to sit quickly.  PT instructs pt in BLE therex 2x10 reps LAQ with 3 second hold, hip flexion,  isometric hip adduction, and heel raises.  Pt returned to room at end of session and positioned in w/c with call bell in reach and needs met.   Therapy Documentation Precautions:  Precautions Precautions: Fall Precaution Comments:  (Left hemiparesis, vertigo ) Restrictions Weight Bearing Restrictions: No  See Function Navigator for Current Functional Status.   Therapy/Group: Individual Therapy  Earnest Conroy Penven-Crew 03/28/2016, 9:53 AM

## 2016-03-29 ENCOUNTER — Inpatient Hospital Stay (HOSPITAL_COMMUNITY): Payer: Medicare Other

## 2016-03-29 ENCOUNTER — Inpatient Hospital Stay (HOSPITAL_COMMUNITY): Payer: Medicare Other | Admitting: Physical Therapy

## 2016-03-29 ENCOUNTER — Inpatient Hospital Stay (HOSPITAL_COMMUNITY): Payer: Medicare Other | Admitting: Speech Pathology

## 2016-03-29 ENCOUNTER — Inpatient Hospital Stay (HOSPITAL_COMMUNITY): Payer: Medicare Other | Admitting: Occupational Therapy

## 2016-03-29 LAB — GLUCOSE, CAPILLARY: Glucose-Capillary: 115 mg/dL — ABNORMAL HIGH (ref 65–99)

## 2016-03-29 NOTE — Progress Notes (Signed)
Occupational Therapy Note  Patient Details  Name: Megan NielsenBeverley Atkinson MRN: [id number] Date of Birth: [birth year]  Today's Date: 03/29/2016 OT Individual Time: 1100-1200 OT Individual Time Calculation (min): 60 min   Pt denied pain Individual Therapy  Pt resting in w/c upon arrival.  Pt propelled w/c to nursing station with BLE and transitioned to gym.  Pt engaged in sit<>stand, standing balance activities with BUE use, and functional transfers.  Pt required min verbal cues to lock w/c brakes when repositioning in w/c. Pt verbalized understanding.  Pt exhihibits difficulty controlling descent when sitting back onto surfaces.  Pt propelled w/c with BLE back to her room. Pt remained in w/c with all needs within reach.    Lavone NeriLanier, Mya Suell [facility]ell 03/29/2016, 12:13 PM

## 2016-03-29 NOTE — Progress Notes (Signed)
Physical Therapy Session Note  Patient Details  Name: Megan Atkinson MRN: [id number] Date of Birth: [birth year]  Today's Date: 03/29/2016 PT Individual Time: 1430-1500 PT Individual Time Calculation (min): 30 min   Short Term Goals: Week 3:  PT Short Term Goal 1 (Week 3): =LTGs due to ELOS  Skilled Therapeutic Interventions/Progress Updates:   Pt received sitting in WC and agreeable to PT. PT transported pt to rehab gym for time management.   PT instructed pt in Gait on ramp to simulate access to house with RW and intermittent Supervision-min assist with min assist to improve AD management.    Car transfer to sedan height at 18 inches with supervision assist and min cues for gait pattern and proper UE placement to allow improved transition to standing from edge of seat.   Step training on 4 inch aerobic step with posterior ascent to simulate possible use of step to access SUV height seat. PT reinforced need for sedan height car for safety d/c plan and access to house.  Patient returned to room and left sitting in [facility] with call bell in reach and all needs met.           Therapy Documentation Precautions:  Precautions Precautions: Fall Precaution Comments:  (Left hemiparesis, vertigo ) Restrictions Weight Bearing Restrictions: No Pain: 0/10   See Function Navigator for Current Functional Status.   Therapy/Group: Individual Therapy  Lorie Phenix 03/29/2016, 6:03 PM

## 2016-03-29 NOTE — Progress Notes (Signed)
Physical Therapy Session Note  Patient Details  Name: Megan NielsenBeverley Atkinson MRN: [id number] Date of Birth: 09-Aug-1950  Today's Date: 03/29/2016 PT Individual Time: 0900-1000 PT Individual Time Calculation (min): 60 min   Short Term Goals: Week 3:  PT Short Term Goal 1 (Week 3): =LTGs due to ELOS  Skilled Therapeutic Interventions/Progress Updates: Pt received seated in w/c, denies pain and agreeable to treatment. Pt verbalizing frustration to dietary staff member and therapist about inadequate choices for lunch; nursing director alerted to pt request to speak with her regarding discontent. W/c propulsion BLE to gym for strengthening and endurance. Stand pivot transfer w/c <>mat table with RW and S. Seated LE strengthening exercises 2x10 reps with 1.5# ankle weights BLE. Standing balance alternating toe taps to 1" step with BUE support on RW and min guard; focus on weight shifting, LLE NMR. Seated kinetron x6 min total for strength/endurance. Returned to room totalA; remained seated in w/c at end of session, all needs in reach.      Therapy Documentation Precautions:  Precautions Precautions: Fall Precaution Comments:  (Left hemiparesis, vertigo ) Restrictions Weight Bearing Restrictions: No   See Function Navigator for Current Functional Status.   Therapy/Group: Individual Therapy  Vista Lawmanlizabeth J Tygielski 03/29/2016, 9:51 AM

## 2016-03-29 NOTE — Progress Notes (Signed)
Occupational Therapy Session Note  Patient Details  Name: Sunnie NielsenBeverley Vorce MRN: [id number] Date of Birth: [birth year]  Today's Date: 03/29/2016 OT Individual Time: 0730-0830 OT Individual Time Calculation (min): 60 min    Short Term Goals: Week 3:  OT Short Term Goal 1 (Week 3): STG = LTGs due to remaining LOS  Skilled Therapeutic Interventions/Progress Updates:    Treatment session with focus on ADL retraining, functional transfers, and increased safety awareness with mobility and self-care tasks.  Completed stand pivot transfer into room shower with close supervision and use of grab bars. Discussed recommendation for sponge bathing at home until able to safely get upstairs to use shower with pt verbalizing understanding.  Discussed modifications and schedule that pt's son has setup regarding family education in preparation for d/c and supervision post d/c.  Continue to recommend 24 hr supervision for setup and safety, which family is working on.  Bathing completed at sit > stand level in room shower with use of long handled sponge while seated on BSC to increase access to washing perineal area.  Dressing completed at sit > stand level at sink with increased focus on dynamic standing balance without UE support to decrease reliance on sink or RW.  Engaged in further discussion regarding goals and recommendations post d/c.  Therapy Documentation Precautions:  Precautions Precautions: Fall Precaution Comments:  (Left hemiparesis, vertigo ) Restrictions Weight Bearing Restrictions: No General:   Vital Signs: Therapy Vitals Temp: 98.9 F (37.2 C) Temp Source: Oral Pulse Rate: 77 Resp: 18 BP: 128/68 Patient Position (if appropriate): Lying Oxygen Therapy SpO2: 98 % O2 Device: Not Delivered Pain:  Pt with no c/o pain  See Function Navigator for Current Functional Status.   Therapy/Group: Individual Therapy  Rosalio LoudHOXIE, Arissa Fagin 03/29/2016, 7:52 AM

## 2016-03-29 NOTE — Progress Notes (Signed)
Subjective/Complaints:  Patient complaining about her food choices on her car modified, heart healthy diet. Planning to go home. Discussed that family training takes place first Discussed with therapy staff, patient gets very fatigued end the day. Family training is planned for 4 PM patient will likely be too fatigued to do home transfer at that time we'll need to discharge following morning  ROS: pt denies nausea, vomiting, diarrhea, cough, shortness of breath or chest pain   Objective: Vital Signs: Blood pressure 128/68, pulse 77, temperature 98.9 F (37.2 C), temperature source Oral, resp. rate 18, height 5' 7" (1.702 m), weight (!) 169.8 kg (374 lb 6.4 oz), SpO2 98 %. No results found. Results for orders placed or performed during the hospital encounter of 03/10/16 (from the past 72 hour(s))  Glucose, capillary     Status: Abnormal   Collection Time: 03/27/16  4:59 AM  Result Value Ref Range   Glucose-Capillary 145 (H) 65 - 99 mg/dL  Glucose, capillary     Status: None   Collection Time: 03/28/16  5:58 AM  Result Value Ref Range   Glucose-Capillary 91 65 - 99 mg/dL   Comment 1 Notify RN   Glucose, capillary     Status: Abnormal   Collection Time: 03/29/16  5:02 AM  Result Value Ref Range   Glucose-Capillary 115 (H) 65 - 99 mg/dL     General: NAD. Vital signs reviewed. Morbid obesity Heart: RRR. No JVD.  Lungs: Clear to auscultation, breathing Unlabored Abdomen: Positive bowel sounds, soft  Musculoskeletal: No edema. No tenderness Neurologic: Alert Motor 5/5 in bilateral deltoid, bicep, tricep, grip,4+/5 hip flexor, knee extensors, ankle dorsiflexor and plantar flexor Minimal LUE ataxia  Skin: No evidence of breakdown, no evidence of rash Psych: Mood and affect are appropriate   Assessment/Plan: 1. Functional deficits secondary to Left cerebellar infarct which require 3+ hours per day of interdisciplinary therapy in a comprehensive inpatient rehab setting. Physiatrist  is providing close team supervision and 24 hour management of active medical problems listed below. Physiatrist and rehab team continue to assess barriers to discharge/monitor patient progress toward functional and medical goals. FIM: Function - Bathing Position: Shower Body parts bathed by patient: Right arm, Left arm, Chest, Abdomen, Right upper leg, Left upper leg, Front perineal area, Buttocks, Right lower leg, Left lower leg, Back Body parts bathed by helper: Buttocks Bathing not applicable: Right lower leg, Left lower leg, Back Assist Level: Supervision or verbal cues, Set up Assistive Device Comment: with long handled sponge Set up : To obtain items  Function- Upper Body Dressing/Undressing What is the patient wearing?: Pull over shirt/dress Pull over shirt/dress - Perfomed by patient: Thread/unthread right sleeve, Thread/unthread left sleeve, Put head through opening, Pull shirt over trunk Assist Level: More than reasonable time Set up : To obtain clothing/put away Function - Lower Body Dressing/Undressing What is the patient wearing?: Pants, Socks, Shoes, Underwear Position: Wheelchair/chair at sink Underwear - Performed by patient: Thread/unthread right underwear leg, Thread/unthread left underwear leg, Pull underwear up/down Underwear - Performed by helper: Pull underwear up/down Pants- Performed by patient: Thread/unthread right pants leg, Thread/unthread left pants leg, Pull pants up/down Pants- Performed by helper: Pull pants up/down Non-skid slipper socks- Performed by patient: Don/doff right sock, Don/doff left sock Non-skid slipper socks- Performed by helper: Don/doff right sock, Don/doff left sock Socks - Performed by patient: Don/doff right sock, Don/doff left sock Shoes - Performed by patient: Don/doff right shoe, Don/doff left shoe Shoes - Performed by helper: Fasten right, Fasten   left Assist for footwear: Independent Assist for lower body dressing: Set  up Assistive Device Comment: with use of shoe funnel Set up : To obtain clothing/put away  Function - Toileting Toileting steps completed by patient: Adjust clothing prior to toileting, Performs perineal hygiene, Adjust clothing after toileting Toileting steps completed by helper: Adjust clothing prior to toileting, Performs perineal hygiene, Adjust clothing after toileting Toileting Assistive Devices: Grab bar or rail Assist level: Set up/obtain supplies  Function - Toilet Transfers Toilet transfer assistive device: Elevated toilet seat/BSC over toilet, Grab bar Mechanical lift: Stedy Assist level to toilet: Supervision or verbal cues Assist level from toilet: Supervision or verbal cues  Function - Chair/bed transfer Chair/bed transfer method: Stand pivot Chair/bed transfer assist level: Supervision or verbal cues Chair/bed transfer assistive device: Armrests, Walker Mechanical lift: Stedy Chair/bed transfer details: Verbal cues for precautions/safety  Function - Locomotion: Wheelchair Will patient use wheelchair at discharge?: Yes Type: Manual Max wheelchair distance: 125ft Assist Level: Supervision or verbal cues Assist Level: Supervision or verbal cues Wheel 150 feet activity did not occur: Safety/medical concerns Assist Level: Supervision or verbal cues Turns around,maneuvers to table,bed, and toilet,negotiates 3% grade,maneuvers on rugs and over doorsills: No Function - Locomotion: Ambulation Assistive device: Walker-rolling Max distance: 55 Assist level: Supervision or verbal cues Assist level: Supervision or verbal cues Walk 50 feet with 2 turns activity did not occur: Safety/medical concerns Assist level: Supervision or verbal cues Walk 150 feet activity did not occur: Safety/medical concerns Walk 10 feet on uneven surfaces activity did not occur: Safety/medical concerns  Function - Comprehension Comprehension: Auditory Comprehension assist level: Follows complex  conversation/direction with no assist  Function - Expression Expression: Verbal Expression assist level: Expresses basic needs/ideas: With no assist  Function - Social Interaction Social Interaction assist level: Interacts appropriately with others with medication or extra time (anti-anxiety, antidepressant).  Function - Problem Solving Problem solving assist level: Solves complex problems: Recognizes & self-corrects  Function - Memory Memory assist level: Recognizes or recalls 90% of the time/requires cueing < 10% of the time Patient normally able to recall (first 3 days only): Current season, Location of own room, Staff names and faces, That he or she is in a hospital  Medical Problem List and Plan: 1. Ataxia and functional deficitssecondary to Left cerebellar infarct  -Cont CIR therapies, plan d/c Friday, Team conference today please see physician documentation under team conference tab, met with team face-to-face to discuss problems,progress, and goals. Formulized individual treatment plan based on medical history, underlying problem and comorbidities. 2. DVT Prophylaxis/Anticoagulation: Pharmaceutical: Lovenox 3. Pain Management: N/A 4. Mood: Team to provide ego support. LCSW to follow for evaluation and support. Anxiety on Buspar and citalopram, gets dizzy with Buspar changed to PRN 5. Neuropsych: This patient iscapable of making decisions on herown behalf. Dr Rodenbough following for relaxation training- ?OP f/u 6. Skin/Wound Care: Routine pressure relief measures.  7. Fluids/Electrolytes/Nutrition: Monitor I/O.   BMP within acceptable range on 2/16  Encourage fluids    qHS snack 8. HTN: Monitor BP bid. Running a little low no dizziness Vitals:   03/28/16 2326 03/29/16 0612  BP:  128/68  Pulse: 70 77  Resp: 20 18  Temp:  98.9 F (37.2 C)    Resumed Lasix 2/9  Metoprolol 12.5 restarted 2/10 (home 50)  Controlled 2/19 9. OSA: Continue to encourage  consistent use of CPAP 10. Morbid obesity: BMI 55.87. consult dietician to educate on CM/Heart healthy diet. Pt complains that she doesn't like her dietary   choices                                                        11. Prediabetes: Hgb A1C-6.3. CM diet reinforced. Monitor BS ac/hs for trends.   12. CAD s/p CABG: On ASA and Lipitor  13 Constipation improved on senna S BID, BM x 2 yesterday continent 14. Mild ABLA  Hb 11.8 on 2/16  F/u OP labs at PCP  LOS (Days) 19 A FACE TO FACE EVALUATION WAS PERFORMED  KIRSTEINS,ANDREW E 03/29/2016, 11:58 AM

## 2016-03-29 NOTE — Progress Notes (Signed)
Social Work Patient ID: Megan Atkinson, female   DOB: [birth year], 66 y.o.   MRN: [id number]  Met with pt and discuss team conference progress toward her goals and the recommendation is still 24 hr supervision. Son-Lamont coming tomorrow at 4:00 to go through [employer] and car transfers. He will be the one taking her home. Made aware to him also recommendation, he may hire a caregiver during the day for pt when Discharged. Encouraged him to do so, along with the home health that will be going out. Pt continues to feel we worry too much and she will be fine at home. Will work toward discharge Friday.

## 2016-03-29 NOTE — Progress Notes (Signed)
03/29/2016- Respiratory care note- Pt assisted with placing cpap for night time use.  Pt resting comfortably, no distress.

## 2016-03-30 ENCOUNTER — Telehealth: Payer: Self-pay | Admitting: Internal Medicine

## 2016-03-30 ENCOUNTER — Inpatient Hospital Stay (HOSPITAL_COMMUNITY): Payer: Medicare Other | Admitting: Physical Therapy

## 2016-03-30 ENCOUNTER — Inpatient Hospital Stay (HOSPITAL_COMMUNITY): Payer: Medicare Other | Admitting: Occupational Therapy

## 2016-03-30 LAB — GLUCOSE, CAPILLARY: Glucose-Capillary: 109 mg/dL — ABNORMAL HIGH (ref 65–99)

## 2016-03-30 NOTE — Progress Notes (Signed)
Social Work Patient ID: Megan NielsenBeverley Atkinson, female   DOB: 1950/04/29, 66 y.o.   MRN: [id number]  Megan CollinsLamont contacted worker back and reports all he has is SUV's, will let [place]-PT know and see how can problem Solve this today. Unsure what vehicle pt drives.

## 2016-03-30 NOTE — Progress Notes (Signed)
Recreational Therapy Discharge Summary Patient Details  Name: Megan Atkinson MRN: [id number] Date of Birth: 10/14/1950 Today's Date: 03/30/2016  Long term goals set: 1  Long term goals met: 1  Comments on progress toward goals: Pt participated in TR eval session including education on relaxation training with emphasis on deep breathing techniques and in community reintegration education group.  Pt is scheduled for discharge home tomorrow with team recommendation of 24 hour supervision.  Reasons for discharge: discharge from hospital  Patient/family agrees with progress made and goals achieved: Yes  Adam Sanjuan 03/30/2016, 10:58 AM

## 2016-03-30 NOTE — Patient Care Conference (Signed)
Inpatient RehabilitationTeam Conference and Plan of Care Update Date: 03/29/2016   Time: 11:00 AM    Patient Name: Megan Atkinson      Medical Record Number: [id number]  Date of Birth: 02/25/1950 Sex: Female         Room/Bed: 4W19C/4W19C-01 Payor Info: Payor: BLUE [employer]OSS [employer]UE [employer] MEDICARE / Plan: BCBS MEDICARE / Product Type: *No Product type* /    Admitting Diagnosis: CVA  Admit Date/Time:  03/10/2016  2:54 PM Admission Comments: No comment available   Primary Diagnosis:  Ataxia, post-stroke Principal Problem: Ataxia, post-stroke  Patient Active Problem List   Diagnosis Date Noted  . Acute blood loss anemia   . Elevated serum creatinine   . Slow transit constipation   . Benign essential HTN   . Generalized anxiety disorder   . Gait disturbance, post-stroke 03/13/2016  . Cerebellar stroke, acute (HCC) 03/10/2016  . Super obese (HCC)   . Prediabetes   . Ataxia, post-stroke   . Acute cystitis without hematuria   . CVA (cerebral vascular accident) (HCC) 03/06/2016  . Morbid obesity (HCC) 02/29/2016  . OSA (obstructive sleep apnea) 11/24/2014  . Dyspnea 10/02/2014  . Anemia 04/05/2011  . Urinary tract infection, site not specified 04/05/2011  . Diabetes mellitus (HCC) 02/22/2011  . Hyperlipidemia 02/22/2011  . CAD (coronary artery disease) 01/07/2011  . Hypertension 01/07/2011    Expected Discharge Date: Expected Discharge Date: 03/31/16  Team Members Present: Physician leading conference: Dr. Claudette Laws Social Worker Present: Dossie Der, LCSW Nurse Present: Carmie End, RN PT Present: Teodoro Kil, PT OT Present: Rosalio Loud, OT SLP Present: Feliberto Gottron, SLP PPS Coordinator present : Tora Duck, RN, CRRN     Current Status/Progress Goal Weekly Team Focus  Medical   poor safety awareness, pt concerned about diet  minimize fall  d/c planning   Bowel/Bladder   continent of bowel and bladder  min assist  monitor q shift   Swallow/Nutrition/  Hydration             ADL's   supervision sit > stand for bathing and dressing at sink, min-supervision transfers with grab bar or RW  Supervision overall  dynamic standing balance, stand pivot transfers and transfers ambulating with RW, d/c planning, family education   Mobility   supervision/min assist overall  supervision/min assist  activity tolerance, transfers, gait, curb step, family education   Communication             Safety/Cognition/ Behavioral Observations            Pain   no c/o pain  pain less than or equal to 3  monitor for nonverval cues of pain   Skin   bruising to abdomen  no skin breakdown this admission  monitor skin q shift    Rehab Goals Patient on target to meet rehab goals: Yes *See Care Plan and progress notes for long and short-term goals.  Barriers to Discharge: fall risk    Possible Resolutions to Barriers:  family education,     Discharge Planning/Teaching Needs:  pt has improved in last week, really opushing herself and insistent on going home mod/i. Son to come in Thursday for education @ 4:00. Pt will be home alone at discharge.      Team Discussion:  Reaching goals of supervision level, son coming in tomorrow to be trained on car transfers and steps. He is planning to hire some assist during the day for Mom. Medically stable for discharge Friday. Pt complaining about the  food and ready to go home. Aware of team's recommendation for 24 hr supervision-feels will be fine at home.  Revisions to Treatment Plan:  DC 2/23   Continued Need for Acute Rehabilitation Level of Care: The patient requires daily medical management by a physician with specialized training in physical medicine and rehabilitation for the following conditions: Daily direction of a multidisciplinary physical rehabilitation program to ensure safe treatment while eliciting the highest outcome that is of practical value to the patient.: Yes Daily medical management of patient  stability for increased activity during participation in an intensive rehabilitation regime.: Yes Daily analysis of laboratory values and/or radiology reports with any subsequent need for medication adjustment of medical intervention for : Neurological problems;Diabetes problems  Lourdes Kucharski, Lemar LivingsRebecca G 03/30/2016, 8:55 AM

## 2016-03-30 NOTE — Progress Notes (Signed)
Subjective/Complaints:  No issues overnite  ROS: pt denies nausea, vomiting, diarrhea, cough, shortness of breath or chest pain   Objective: Vital Signs: Blood pressure (!) 125/58, pulse 70, temperature 98 F (36.7 C), temperature source Oral, resp. rate 18, height 5\' 7"  (1.702 m), weight (!) 169.8 kg (374 lb 6.4 oz), SpO2 100 %. No results found. Results for orders placed or performed during the hospital encounter of 03/10/16 (from the past 72 hour(s))  Glucose, capillary     Status: None   Collection Time: 03/28/16  5:58 AM  Result Value Ref Range   Glucose-Capillary 91 65 - 99 mg/dL   Comment 1 Notify RN   Glucose, capillary     Status: Abnormal   Collection Time: 03/29/16  5:02 AM  Result Value Ref Range   Glucose-Capillary 115 (H) 65 - 99 mg/dL     General: NAD. Vital signs reviewed. Morbid obesity Heart: RRR. No JVD.  Lungs: Clear to auscultation, breathing Unlabored Abdomen: Positive bowel sounds, soft  Musculoskeletal: No edema. No tenderness Neurologic: Alert Motor 5/5 in bilateral deltoid, bicep, tricep, grip,4+/5 hip flexor, knee extensors, ankle dorsiflexor and plantar flexor Minimal LUE ataxia  Skin: No evidence of breakdown, no evidence of rash Psych: Mood and affect are appropriate   Assessment/Plan: 1. Functional deficits secondary to Left cerebellar infarct which require 3+ hours per day of interdisciplinary therapy in a comprehensive inpatient rehab setting. Physiatrist is providing close team supervision and 24 hour management of active medical problems listed below. Physiatrist and rehab team continue to assess barriers to discharge/monitor patient progress toward functional and medical goals. FIM: Function - Bathing Position: Shower Body parts bathed by patient: Right arm, Left arm, Chest, Abdomen, Right upper leg, Left upper leg, Front perineal area, Buttocks, Right lower leg, Left lower leg, Back Body parts bathed by helper: Buttocks Bathing not  applicable: Right lower leg, Left lower leg, Back Assist Level: Supervision or verbal cues, Set up Assistive Device Comment: with long handled sponge Set up : To obtain items  Function- Upper Body Dressing/Undressing What is the patient wearing?: Pull over shirt/dress Pull over shirt/dress - Perfomed by patient: Thread/unthread right sleeve, Thread/unthread left sleeve, Put head through opening, Pull shirt over trunk Assist Level: More than reasonable time Set up : To obtain clothing/put away Function - Lower Body Dressing/Undressing What is the patient wearing?: Pants, Socks, Shoes, Underwear Position: Wheelchair/chair at sink Underwear - Performed by patient: Thread/unthread right underwear leg, Thread/unthread left underwear leg, Pull underwear up/down Underwear - Performed by helper: Pull underwear up/down Pants- Performed by patient: Thread/unthread right pants leg, Thread/unthread left pants leg, Pull pants up/down Pants- Performed by helper: Pull pants up/down Non-skid slipper socks- Performed by patient: Don/doff right sock, Don/doff left sock Non-skid slipper socks- Performed by helper: Don/doff right sock, Don/doff left sock Socks - Performed by patient: Don/doff right sock, Don/doff left sock Shoes - Performed by patient: Don/doff right shoe, Don/doff left shoe Shoes - Performed by helper: Fasten right, Fasten left Assist for footwear: Independent Assist for lower body dressing: Set up Assistive Device Comment: with use of shoe funnel Set up : To obtain clothing/put away  Function - Toileting Toileting steps completed by patient: Adjust clothing prior to toileting, Performs perineal hygiene, Adjust clothing after toileting Toileting steps completed by helper: Adjust clothing prior to toileting, Performs perineal hygiene, Adjust clothing after toileting Toileting Assistive Devices: Grab bar or rail Assist level: Set up/obtain supplies  Function - Toilet Transfers Toilet  transfer assistive device: Elevated  toilet seat/BSC over toilet, Grab bar Mechanical lift: Stedy Assist level to toilet: Supervision or verbal cues Assist level from toilet: Supervision or verbal cues  Function - Chair/bed transfer Chair/bed transfer method: Stand pivot Chair/bed transfer assist level: Supervision or verbal cues Chair/bed transfer assistive device: Armrests, Walker Mechanical lift: Stedy Chair/bed transfer details: Verbal cues for precautions/safety  Function - Locomotion: Wheelchair Will patient use wheelchair at discharge?: Yes Type: Manual Max wheelchair distance: 11125ft Assist Level: Supervision or verbal cues Assist Level: Supervision or verbal cues Wheel 150 feet activity did not occur: Safety/medical concerns Assist Level: Supervision or verbal cues Turns around,maneuvers to table,bed, and toilet,negotiates 3% grade,maneuvers on rugs and over doorsills: No Function - Locomotion: Ambulation Assistive device: Walker-rolling Max distance: 55 Assist level: Supervision or verbal cues Assist level: Supervision or verbal cues Walk 50 feet with 2 turns activity did not occur: Safety/medical concerns Assist level: Supervision or verbal cues Walk 150 feet activity did not occur: Safety/medical concerns Walk 10 feet on uneven surfaces activity did not occur: Safety/medical concerns  Function - Comprehension Comprehension: Auditory Comprehension assist level: Follows complex conversation/direction with no assist  Function - Expression Expression: Verbal Expression assist level: Expresses basic needs/ideas: With no assist  Function - Social Interaction Social Interaction assist level: Interacts appropriately with others with medication or extra time (anti-anxiety, antidepressant).  Function - Problem Solving Problem solving assist level: Solves complex problems: Recognizes & self-corrects  Function - Memory Memory assist level: Recognizes or recalls 90% of the  time/requires cueing < 10% of the time Patient normally able to recall (first 3 days only): Current season, Location of own room, Staff names and faces, That he or she is in a hospital  Medical Problem List and Plan: 1. Ataxia and functional deficitssecondary to Left cerebellar infarct  plan D/C in am 2. DVT Prophylaxis/Anticoagulation: Pharmaceutical: Lovenox 3. Pain Management: N/A 4. Mood: Team to provide ego support. LCSW to follow for evaluation and support. Anxiety on Buspar and citalopram, gets dizzy with Buspar changed to PRN 5. Neuropsych: This patient iscapable of making decisions on herown behalf. Dr Kieth Brightlyodenbough following for relaxation training- ?OP f/u 6. Skin/Wound Care: Routine pressure relief measures.  7. Fluids/Electrolytes/Nutrition: Monitor I/O.   BMP within acceptable range on 2/16  Encourage fluids    qHS snack 8. HTN: Monitor BP bid. BP generally well controlled Vitals:   03/29/16 2200 03/30/16 0503  BP: (!) 142/48 (!) 125/58  Pulse: 69 70  Resp:  18  Temp:  98 F (36.7 C)    Resumed Lasix 2/9  Metoprolol 12.5 restarted 2/10 (home 50)  Controlled 2/19 9. OSA: Continue to encourage consistent use of CPAP 10. Morbid obesity: BMI 55.87. consult dietician to educate on CM/Heart healthy diet. Pt complains that she doesn't like her dietary choices                                                        11. Prediabetes: Hgb A1C-6.3. CM diet reinforced. Monitor BS ac/hs for trends.   12. CAD s/p CABG: On ASA and Lipitor  13 Constipation improved on senna S BID,  14. Mild ABLA  Hb 11.8 on 2/16  F/u OP labs at PCP  LOS (Days) 20 A FACE TO FACE EVALUATION WAS PERFORMED  KIRSTEINS,ANDREW E 03/30/2016, 8:14 AM

## 2016-03-30 NOTE — Progress Notes (Signed)
Physical Therapy Discharge Summary  Patient Details  Name: Megan Atkinson MRN: [id number] Date of Birth: 1950/10/05  Today's Date: 03/30/2016 PT Individual Time: 8416-6063 AND [phone]  PT Individual Time Calculation (min): 55 min AND 13mn    Patient has met 10 of 11 long term goals due to improved activity tolerance, improved balance, improved postural control, increased strength, ability to compensate for deficits, functional use of  left upper extremity and left lower extremity, improved awareness and improved coordination.  Patient to discharge at an ambulatory level for short distances with  Supervision.   Patient's care partner is independent to provide the necessary physical assistance at discharge.  Reasons goals not met: continued supervision required for safety with standing.   Recommendation:  Patient will benefit from ongoing skilled PT services in home health setting to continue to advance safe functional mobility, address ongoing impairments in balance, endurance, strength, safety, gait, access to house and minimize fall risk.  Equipment: WC  Reasons for discharge: treatment goals met and discharge from hospital  Patient/family agrees with progress made and goals achieved: Yes   PT Treatment:  Session 1 Pt received sitting in WC and agreeable to PT.  PT instructed patient in Grad day assessment to measure pt progress towards goals; see below for results. Patient returned to room and [facility] and all needs met.     Session 2:   Pt's son present for family education. PT instructed pt and son in education for WWhitehall Surgery Centermanagement in home setting, gait for short distances with RW and supervision assist, transfers with supervision assist and RW support. PT provided pt's son with cues for proper positioning behind and to L in case of LOB as well as how to cue patient for proper UE placement for safety transfer. PT also instructed pt and son in car  transfer to [place] for safe d/c from hospital with supervision assist.Education provided for ascent and descent of curb step with min assist  And use of RW to enter house. Instruction provided by PT for proper hand placement and support to prevent L lateral LOB as well as AD management. PT educated pt's son on need for 24 hour supervision for safety with emphasis on using WC as primary means of transportation once in home. Pt's son verbalized understanding and demonstrated safe techniques for all mobility.  Patient returned to room and left sitting in [facility]with call bell in reach and all needs met.        PT Discharge Precautions/Restrictions Restrictions Weight Bearing Restrictions: No Vital Signs  Pain Pain Assessment Pain Assessment: No/denies pain Pain Score: 0-No pain Vision/Perception  Vision - Assessment Eye Alignment: Within Functional Limits Perception Comments: WFL  Cognition   Sensation Sensation Light Touch: Appears Intact Stereognosis: Not tested Hot/Cold: Appears Intact Proprioception: Appears Intact Coordination Gross Motor Movements are Fluid and Coordinated: No Fine Motor Movements are Fluid and Coordinated: Yes Motor  Motor Motor: Ataxia;Hemiplegia Motor - Discharge Observations: mild ataxia with gait as well as LLE weakness.   Mobility Bed Mobility Bed Mobility: Rolling Right;Rolling Left;Supine to Sit;Sit to Supine Rolling Right: 5: Supervision Rolling Right Details: Verbal cues for precautions/safety;Verbal cues for technique Rolling Left: 5: Supervision Rolling Left Details: Verbal cues for technique;Verbal cues for precautions/safety Supine to Sit: 5: Supervision Supine to Sit Details: Verbal cues for technique;Verbal cues for precautions/safety Transfers Transfers: Yes Sit to Stand: 5: Supervision (with RW) Sit to Stand Details: Verbal cues for technique;Verbal cues  for precautions/safety Stand to Sit: 5: Supervision (with RW) Stand to Sit  Details (indicate cue type and reason): Verbal cues for technique;Verbal cues for precautions/safety Stand Pivot Transfers: 5: Supervision (with RW) Stand Pivot Transfer Details: Verbal cues for technique;Verbal cues for precautions/safety Locomotion  Ambulation Ambulation: Yes Ambulation/Gait Assistance: 5: Supervision Ambulation Distance (Feet): 65 Feet Assistive device: Rolling walker Ambulation/Gait Assistance Details: Verbal cues for technique;Verbal cues for precautions/safety Gait Gait: Yes Gait Pattern: Impaired Gait Pattern: Ataxic;Decreased stride length;Decreased step length - right;Decreased step length - left;Wide base of support;Poor foot clearance - left;Poor foot clearance - right Stairs / Additional Locomotion Stairs: Yes Stairs Assistance: 4: Min assist Stairs Assistance Details: Verbal cues for technique;Verbal cues for precautions/safety;Verbal cues for gait pattern;Visual cues/gestures for precautions/safety Stair Management Technique: With walker Number of Stairs: 2 Height of Stairs: 6 Ramp: 4: Min assist Curb: 4: Min Chemical engineer: Yes Wheelchair Assistance: 6: Modified independent (Device/Increase time) Environmental health practitioner: Both upper extremities;Right lower extremity Wheelchair Parts Management: Independent Distance: 122f   Trunk/Postural Assessment  Cervical Assessment Cervical Assessment: Within Functional Limits Thoracic Assessment Thoracic Assessment: Exceptions to [facility](rounded shoulders) Lumbar Assessment Lumbar Assessment: Within Functional Limits Postural Control Postural Control: Deficits on evaluation (delayed reaction time in standing, WFL in sitting. )  Balance Dynamic Sitting Balance Dynamic Sitting - Level of Assistance: 7: Independent Static Standing Balance Static Standing - Balance Support: Bilateral upper extremity supported Static Standing - Level of Assistance: 5: Stand by assistance Static  Standing - Comment/# of Minutes: with functional gait tasks.  Extremity Assessment      RLE Assessment RLE Assessment: Exceptions to WMontgomery Surgical CenterRLE Strength RLE Overall Strength: Deficits Right Hip Flexion: 4/5 Right Hip ABduction: 5/5 Right Hip ADduction: 5/5 Right Knee Flexion: 4+/5 Right Knee Extension: 4+/5 Right Ankle Dorsiflexion: 4+/5 Right Ankle Plantar Flexion: 4+/5 Right Ankle Inversion: 5/5 Right Ankle Eversion: 5/5 LLE Assessment LLE Assessment: Exceptions to WNorthwest Surgical HospitalLLE Strength Left Hip Flexion: 4-/5 Left Knee Flexion: 4+/5 Left Knee Extension: 5/5 Left Ankle Dorsiflexion: 4+/5 Left Ankle Plantar Flexion: 4+/5   See Function Navigator for Current Functional Status.  ALorie Phenix2/22/2018, 10:43 AM

## 2016-03-30 NOTE — Progress Notes (Signed)
Occupational Therapy Session Note  Patient Details  Name: Megan NielsenBeverley Swaminathan MRN: [id number] Date of Birth: Dec 18, 1950  Today's Date: 03/30/2016 OT Individual Time: 0730-0830 OT Individual Time Calculation (min): 60 min    Short Term Goals: Week 3:  OT Short Term Goal 1 (Week 3): STG = LTGs due to remaining LOS  Skilled Therapeutic Interventions/Progress Updates:    Completed ADL retraining at sit > stand level at sink with overall supervision/setup.  Pt demonstrating improved safety awareness with hand placement with sit > stand with decreased reliance on pulling up on sink or RW.  Utilized long handled sponge to wash lower legs and feet.  Continue to recommend 24 hour supervision, with pt continuing to state that she will be okay and they are working on a plan.  Engaged in couch <> BSC transfers as pt reports she will be sleeping on the couch and recommending use of BSC next to couch at night time as well as to decrease challenge with accessing bathroom through narrow doorways.  Strongly recommend supervision to min assist with ambulation.  Initially pt required +2 assist sit > stand from low couch, discussed use of cushion, folded quilts, and/or furniture risers to increase independence with sit > stand from low surface.  Placed w/c cushion on couch with pt able to complete sit > stand and transfers couch <> BSC with supervision with use of RW with increased height of surface.  Pt returned to room propelling w/c via BUE and BLE as this will be primary mode of mobility at home.  Therapy Documentation Precautions:  Precautions Precautions: Fall Precaution Comments:  (Left hemiparesis, vertigo ) Restrictions Weight Bearing Restrictions: No General:   Vital Signs: Therapy Vitals Temp: 98 F (36.7 C) Temp Source: Oral Pulse Rate: 70 Resp: 18 BP: (!) 125/58 Patient Position (if appropriate): Lying Oxygen Therapy SpO2: 100 % O2 Device: Not Delivered Pain:  Pt with no c/o pain  See  Function Navigator for Current Functional Status.   Therapy/Group: Individual Therapy  Rosalio LoudHOXIE, Hala Narula 03/30/2016, 7:55 AM

## 2016-03-30 NOTE — Progress Notes (Signed)
Social Work Patient ID: Megan Atkinson, female   DOB: 01/16/1951, 66 y.o.   MRN: [id number]  Met with pt to update of team conference goals of supervision level and her son coming in for education Thursday at 4:00. Both aware the team recommends 24 hr supervision due to high risk to fall and for safety. Son planning on hiring an aide for hours during the day for assist. Pt wants to go home and feels she will be fine, somewhat unrealistic Regarding her function. All equipment delivered and home health set up. See son when here today for questions or concerns. Son aware how hard headed and stubborn Mother is.

## 2016-03-30 NOTE — Progress Notes (Signed)
Social Work Patient ID: Sunnie NielsenBeverley Dohn, female   DOB: 1950/08/28, 66 y.o.   MRN: [id number]  Left message for Lamont-son regarding bringing a vehicle other than a SUV this will be very difficult for pt to get into. See if he received the message when he is here at 4:00 pm for family education.

## 2016-03-30 NOTE — Progress Notes (Signed)
Occupational Therapy Discharge Summary  Patient Details  Name: Megan Atkinson MRN: [id number] Date of Birth: 1950/08/11  Patient has met 9 of 9 long term goals due to improved activity tolerance, improved balance, postural control, functional use of  LEFT upper extremity, improved awareness and improved coordination.  Patient to discharge at overall Supervision level.  Patient's care partners have not been present for scheduled family education sessions.  This therapist has spoke with son, Megan Atkinson, regarding pt progress and recommendation for 24 hour supervision due to fall risk and need for cues and setup.  Son to attend session with PT for mobility and car transfers, however scheduled after writing of this discharge.  Pt is able to complete stand pivot transfers with distant supervision with RW and all self-care tasks at supervision/setup level - not requiring physical assistance from caregiver, however recommending min guard with all ambulation.  Constant education and explanation regarding recommendation for supervision, with pt reporting they will figure it out.  Reasons goals not met: NA  Recommendation:  Patient will benefit from ongoing skilled OT services in home health setting to continue to advance functional skills in the area of BADL and Reduce care partner burden.  Equipment: w/c and bariatric drop arm BSC  Reasons for discharge: treatment goals met and discharge from hospital  Patient/family agrees with progress made and goals achieved: Yes  OT Discharge Precautions/Restrictions  Precautions Precautions: Fall Restrictions Weight Bearing Restrictions: No Pain Pain Assessment Pain Assessment: No/denies pain ADL ADL ADL Comments: Please see functional navigator for ADL status Vision/Perception  Vision- History Baseline Vision/History: Wears glasses (wears contacts) Patient Visual Report: No change from baseline Vision- Assessment Vision Assessment?: No apparent visual  deficits Eye Alignment: Within Functional Limits Perception Comments: WFL  Cognition Overall Cognitive Status: Within Functional Limits for tasks assessed Arousal/Alertness: Awake/alert Orientation Level: Oriented X4 Attention: Selective;Sustained Sustained Attention: Appears intact Selective Attention: Appears intact Memory: Appears intact Awareness: Appears intact Problem Solving: Appears intact Safety/Judgment: Appears intact Sensation Sensation Light Touch: Appears Intact Stereognosis: Not tested Hot/Cold: Appears Intact Proprioception: Appears Intact Coordination Gross Motor Movements are Fluid and Coordinated: No Fine Motor Movements are Fluid and Coordinated: Yes Finger Nose Finger Test: WFL, mild dysmetria on Lt which pt able to correct with repetitions Extremity/Trunk Assessment RUE Assessment RUE Assessment: Within Functional Limits LUE Assessment LUE Assessment: Within Functional Limits (strength grossly 4/5)   See Function Navigator for Current Functional Status.  Simonne Come 03/30/2016, 3:38 PM

## 2016-03-30 NOTE — Telephone Encounter (Signed)
Patient was last seen by Dr Yetta BarreJones in 2013.  Patient is currently in the hospital for a stroke and needs a hospital follow up.  Can patient establish again with Dr. Yetta BarreJones?

## 2016-03-30 NOTE — Progress Notes (Signed)
Social Work  Discharge Note  The overall goal for the admission was met for:   Discharge location: Yes-HOME WITH INTERMITTENT ASSIST FROM FAMILY AND FRIENDS  Length of Stay: Yes-19 DAYS  Discharge activity level: Yes-SUPERVISION LEVEL  Home/community participation: Yes  Services provided included: MD, RD, PT, OT, SLP, RN, CM, TR, Pharmacy, Neuropsych and SW  Financial Services: Private Insurance: BLUE MEDICARE  Follow-up services arranged: Home Health: [facility]-PT,OT, RN, DME: [facility]-WHEELCHAIR, [facility] [place] and Patient/Family has no preference for HH/DME agencies  Comments (or additional information):[place] WAS [place]. ENCOURAGED HIM TO HIRE A CNA TO ASSIST HER DURING THE DAY AT HOME. PT FEELS SHE WILL BE FINE AT HOME ALONE WITH INTERMITTENT ASSIST, BUT TEAM RECOMMENDS 24 HR SUPERVISION FOR SAFETY AND ASSIST. AWAITING MD TO CONTACT ME REGARDING TAKING PT BACK AS HER PCP SINCE HAD NOT SEEN SINCE 2013.   Patient/Family verbalized understanding of follow-up arrangements: Yes  Individual responsible for coordination of the follow-up plan: SELF & LAMONT-SON  Confirmed correct DME delivered: Elease Hashimoto 03/30/2016    Elease Hashimoto

## 2016-03-31 LAB — BASIC METABOLIC PANEL
Anion gap: 5 (ref 5–15)
BUN: 22 mg/dL — AB (ref 6–20)
CALCIUM: 9.1 mg/dL (ref 8.9–10.3)
CO2: 26 mmol/L (ref 22–32)
CREATININE: 0.85 mg/dL (ref 0.44–1.00)
Chloride: 108 mmol/L (ref 101–111)
GFR calc non Af Amer: >60 mL/min (ref >60)
GLUCOSE: 102 mg/dL — AB (ref 65–99)
Potassium: 4.2 mmol/L (ref 3.5–5.1)
Sodium: 139 mmol/L (ref 135–145)

## 2016-03-31 LAB — CBC
HCT: 35.8 % — ABNORMAL LOW (ref 36.0–46.0)
Hemoglobin: 11 g/dL — ABNORMAL LOW (ref 12.0–15.0)
MCH: 29.2 pg (ref 26.0–34.0)
MCHC: 30.7 g/dL (ref 30.0–36.0)
MCV: 95 fL (ref 78.0–100.0)
PLATELETS: 197 10*3/uL (ref 150–400)
RBC: 3.77 MIL/uL — AB (ref 3.87–5.11)
RDW: 14.2 % (ref 11.5–15.5)
WBC: 6 10*3/uL (ref 4.0–10.5)

## 2016-03-31 LAB — GLUCOSE, CAPILLARY: GLUCOSE-CAPILLARY: 111 mg/dL — AB (ref 65–99)

## 2016-03-31 MED ORDER — FLUTICASONE PROPIONATE 50 MCG/ACT NA SUSP: 1.0000 | Freq: Every day | NASAL | 0 refills | Status: AC

## 2016-03-31 MED ORDER — SENNOSIDES-DOCUSATE SODIUM 8.6-50 MG PO TABS: 2.0000 | ORAL_TABLET | Freq: Two times a day (BID) | ORAL | 0 refills | Status: DC

## 2016-03-31 MED ORDER — FUROSEMIDE 20 MG PO TABS: 10.0000 mg | ORAL_TABLET | Freq: Every day | ORAL | 0 refills | Status: DC

## 2016-03-31 MED ORDER — ATORVASTATIN CALCIUM 80 MG PO TABS: 80.0000 mg | ORAL_TABLET | Freq: Every day | ORAL | 0 refills | Status: DC

## 2016-03-31 MED ORDER — METOPROLOL TARTRATE 25 MG PO TABS: 12.5000 mg | ORAL_TABLET | Freq: Two times a day (BID) | ORAL | 0 refills | Status: DC

## 2016-03-31 MED ORDER — CLOPIDOGREL BISULFATE 75 MG PO TABS: 75.0000 mg | ORAL_TABLET | Freq: Every day | ORAL | 0 refills | Status: DC

## 2016-03-31 MED ORDER — CITALOPRAM HYDROBROMIDE 10 MG PO TABS: 10.0000 mg | ORAL_TABLET | Freq: Every day | ORAL | 0 refills | Status: DC

## 2016-03-31 MED ORDER — ASPIRIN 325 MG PO TABS: 325.0000 mg | ORAL_TABLET | Freq: Every day | ORAL | 0 refills | Status: DC

## 2016-03-31 NOTE — Telephone Encounter (Signed)
No - I am not available to be her doctor

## 2016-03-31 NOTE — Discharge Summary (Signed)
Physician Discharge Summary  Patient ID: Megan NielsenBeverley Atkinson MRN: [id number] DOB/AGE: 03/23/1950 66 y.o.  Admit date: 03/10/2016 Discharge date: 03/31/2016  Discharge Diagnoses:  Principal Problem:   Ataxia, post-stroke Active Problems:   Cerebellar stroke, acute (HCC)   Gait disturbance, post-stroke   Slow transit constipation   Benign essential HTN   Generalized anxiety disorder   Elevated serum creatinine   Acute blood loss anemia   Discharged Condition: stable   Significant Diagnostic Studies:   Labs:  Basic Metabolic Panel: BMP Latest Ref Rng & Units 03/31/2016 03/24/2016 03/17/2016  Glucose 65 - 99 mg/dL 409(W102(H) 97 99  BUN 6 - 20 mg/dL 11(B22(H) 19 14(N25(H)  Creatinine 0.44 - 1.00 mg/dL 8.290.85 5.620.83 1.30(Q1.04(H)  Sodium 135 - 145 mmol/L 139 140 138  Potassium 3.5 - 5.1 mmol/L 4.2 4.1 4.1  Chloride 101 - 111 mmol/L 108 109 104  CO2 22 - 32 mmol/L 26 24 25   Calcium 8.9 - 10.3 mg/dL 9.1 9.2 9.4     CBC: CBC Latest Ref Rng & Units 03/31/2016 03/24/2016 03/17/2016  WBC 4.0 - 10.5 K/uL 6.0 7.0 8.2  Hemoglobin 12.0 - 15.0 g/dL 11.0(L) 11.8(L) 12.3  Hematocrit 36.0 - 46.0 % 35.8(L) 38.7 39.7  Platelets 150 - 400 K/uL 197 183 233    CBG:  Recent Labs Lab 03/27/16 0459 03/28/16 0558 03/29/16 0502 03/30/16 0507 03/31/16 0524  GLUCAP 145* 91 115* 109* 111*    Brief HPI:   Megan MuleBeverley Catheyis a 66 y.o.femalewith history of CAD, HTN, morbid obesity who was admitted on 03/06/16 with vertigo and left sided weakness with numbness. CT head reviewed, showing left cerebellar infarct,thought to be subacute. TPA not administered. CTA head/neck no significant stenosis in neck and moderate focal stenosis left VA. 2D echo with eF 60-65% with mild to moderately calcified aortic valve and grade I diastolic dysfunction. BLE dopplers negative for DVT. Dr. Roda ShuttersXu felt that stroke due to atherosclerosis and multiple risk factors. He recommended ASA/Plavix for three months followed by plavix alone. MBS done 1/30  revealing mild pharyngeal dysphagia with penetration of thin therefore chin tuck with thin liquids recommended. Swallow re-eval done today showing improvement and no need for chin tuck. Patient with resultant visual deficits with vestibular issues, difficulty with weight shifting, stooped posture and left lateral lean with ambulation. Therapy ongoing and CIR recommended for follow up therapy.    Hospital Course: Megan NielsenBeverley Atkinson was admitted to rehab 03/10/2016 for inpatient therapies to consist of PT and OT at least three hours five days a week. Past admission physiatrist, therapy team and rehab RN have worked together to provide customized collaborative inpatient rehab. Blood pressures were monitored on bid basis and continued to be elevated low dose metoprolol and lasix was resumed. BMET has been monitored during her stay and she was noted to have rise in BUN with resumption of lasix. CBC shows downward trend in H/H but no signs of bleeding reported. She will need repeat check of lytes as well as CBC for follow up in next 1-2 weeks.  She had high levels of anxiety initially affecting mobility and Dr. Kieth Brightlyodenbough has been following for relaxation techniques. Buspar was tried briefly but was discontinued due to report of dizziness and Celexa was added to help manage anxiety. She was educated on importance of weight loss as well as CM diet. Blood sugars were monitored on ac/hs basis and have ranged from 90- 120 range. She has shown improved compliance with CPAP use. With improvement in confidence and decrease in  anxiety, her progress has improved to supervision at wheelchair level. to min assist level. She was given resources on hiring assistance after discharge. She will continue to receive follow up HHPT, HHOT and HHRN by [facility] after discharge.    Rehab course: During patient's stay in rehab weekly team conferences were held to monitor patient's progress, set goals and discuss barriers to  discharge. At admission, patient required max assist with mobility and total assist with basic self care tasks.  She therapy evaluation done with brief education of swallow strategies and cognition was noted to be intact therefore no speech therapy needed during her stay. She has had improvement in activity tolerance, balance, postural control, as well as ability to compensate for deficits. She is able to perform stand pivot transfer with supervision. She is able to complete ADL tasks with set up assist and supervision.  She is able to ambulate 38' with RW and supervision. She requires min assist to climb 2 stairs. Family education was completed with son regarding all aspect of care and need for 24 hours supervision due to balance and safety concerns.     Disposition: 01-Home or Self Care  Diet:  Heart healthy/Diabetic restrictions.   Special Instructions: 1. Will  Need recheck of BMET/CBC in 2 weeks.  2. Will need to continue ASA/plavix for 3 months total followed by plavix alone.    Discharge Instructions    Ambulatory referral to Physical Medicine Rehab    Complete by:  As directed    1-2 weeks transitional care     Allergies as of 03/31/2016      Reactions   Lisinopril    cough   Metformin And Related Diarrhea   A lot of fluid loss from bowel and diarrhea      Medication List    STOP taking these medications   Ginkgo 60 MG Tabs   glucosamine-chondroitin 500-400 MG tablet   Green Tea (Camillia sinensis) 1000 MG Tabs   pravastatin 80 MG tablet Commonly known as:  PRAVACHOL   sulfamethoxazole-trimethoprim 800-160 MG tablet Commonly known as:  BACTRIM DS,SEPTRA DS     TAKE these medications   aspirin 325 MG tablet Take 1 tablet (325 mg total) by mouth daily.   atorvastatin 80 MG tablet Commonly known as:  LIPITOR Take 1 tablet (80 mg total) by mouth daily at 6 PM.   calcium-vitamin D 500-200 MG-UNIT tablet Take 1 tablet by mouth daily.   citalopram 10 MG  tablet Commonly known as:  CELEXA Take 1 tablet (10 mg total) by mouth at bedtime.   clopidogrel 75 MG tablet Commonly known as:  PLAVIX Take 1 tablet (75 mg total) by mouth daily.   fluticasone 50 MCG/ACT nasal spray Commonly known as:  FLONASE Place 1 spray into both nostrils daily. Start taking on:  04/01/2016   furosemide 20 MG tablet Commonly known as:  LASIX Take 0.5 tablets (10 mg total) by mouth daily. Start taking on:  04/01/2016   metoprolol tartrate 25 MG tablet Commonly known as:  LOPRESSOR Take 0.5 tablets (12.5 mg total) by mouth 2 (two) times daily. What changed:  See the new instructions.   senna-docusate 8.6-50 MG tablet Commonly known as:  Senokot-S Take 2 tablets by mouth 2 (two) times daily.   VITAMIN B-12 IJ weekly      Follow-up Information    Sanda Linger, MD Follow up.   Specialty:  Internal Medicine Why:  asking MD permission to take patient back has not  seen since 2013. He is off until Monday  Contact information: [address] [place] [address] [phone]        Erick Colace, MD Follow up.   Specialty:  Physical Medicine and Rehabilitation Why:  office will call you with follow up appointment Contact information: [address] [place] [address] [phone]        Nilda Riggs, NP. Call in 1 day(s).   Specialty:  Family Medicine Why:  for appointment in 4-6 weeks.  Contact information: [address] [place] [address] [phone]           Signed: Jacquelynn Cree 03/31/2016, 6:21 PM

## 2016-03-31 NOTE — Discharge Instructions (Signed)
Inpatient Rehab Discharge Instructions  Megan NielsenBeverley Atkinson Discharge date and time: 03/31/16    Activities/Precautions/ Functional Status: Activity: no lifting, driving, or strenuous exercise  till cleared by MD.  Diet: cardiac diet and diabetic diet Wound Care: none needed   Functional status:  ___ No restrictions     ___ Walk up steps independently _X__ 24/7 supervision/assistance   ___ Walk up steps with assistance ___ Intermittent supervision/assistance  ___ Bathe/dress independently ___ Walk with walker     _X__ Bathe/dress with assistance ___ Walk Independently    ___ Shower independently ___ Walk with assistance    ___ Shower with assistance _X__ No alcohol     ___ Return to work/school ________  Special Instructions:    COMMUNITY REFERRALS UPON DISCHARGE:    Home Health:   PT, OT, RN  Agency:[facility]    Phone:[phone]   Date of last service:03/31/2016   Medical Equipment/Items Ordered:[facility]IDE DROP-ARM BEDSIDE COMMODE, [facility]  Agency/Supplier:[facility]   [phone] Other:PRIVATE DUTY LIST GIVEN FOR PT TO FOLLOW UP WITH IF SHE CHOOSES  GENERAL COMMUNITY RESOURCES FOR PATIENT/FAMILY: Support Groups:[facility] SUPPORT GROUP EVERY SECOND Thursday @ 3:00-4:00 PM ON THE REHAB UNIT QUESTIONS CONTACT [place] [phone]  STROKE/TIA DISCHARGE INSTRUCTIONS SMOKING Cigarette smoking nearly doubles your risk of having a stroke & is the single most alterable risk factor  If you smoke or have smoked in the last 12 months, you are advised to quit smoking for your health.  Most of the excess cardiovascular risk related to smoking disappears within a year of stopping.  Ask you doctor about anti-smoking medications  [place] Quit Line: 1-800-QUIT NOW  Free Smoking Cessation Classes [phone]  CHOLESTEROL Know your levels; limit fat & cholesterol in your diet  Lipid Panel     Component Value Date/Time   CHOL 196 03/07/2016 0307   TRIG 58  03/07/2016 0307   HDL 47 03/07/2016 0307   CHOLHDL 4.2 03/07/2016 0307   VLDL 12 03/07/2016 0307   LDLCALC 137 (H) 03/07/2016 0307      Many patients benefit from treatment even if their cholesterol is at goal.  Goal: Total Cholesterol (CHOL) less than 160  Goal:  Triglycerides (TRIG) less than 150  Goal:  HDL greater than 40  Goal:  LDL (LDLCALC) less than 100   BLOOD PRESSURE American Stroke Association blood pressure target is less that 120/80 mm/Hg  Your discharge blood pressure is:  BP: (!) 120/54  Monitor your blood pressure  Limit your salt and alcohol intake  Many individuals will require more than one medication for high blood pressure  DIABETES (A1c is a blood sugar average for last 3 months) Goal HGBA1c is under 7% (HBGA1c is blood sugar average for last 3 months)  Diabetes:     Lab Results  Component Value Date   HGBA1C 6.3 (H) 03/07/2016     Your HGBA1c can be lowered with medications, healthy diet, and exercise.  Check your blood sugar as directed by your physician  Call your physician if you experience unexplained or low blood sugars.  PHYSICAL ACTIVITY/REHABILITATION Goal is 30 minutes at least 4 days per week  Activity: NO driving Therapies: see above Return to work: N/A  Activity decreases your risk of heart attack and stroke and makes your heart stronger.  It helps control your weight and blood pressure; helps you relax and can improve your mood.  Participate in a regular exercise program.  Talk with your doctor about the  best form of exercise for you (dancing, walking, swimming, cycling).  DIET/WEIGHT Goal is to maintain a healthy weight  Your discharge diet is: Diet heart healthy/carb modified Room service appropriate? Yes; Fluid consistency: Thin liquids Your height is:  Height: 5\' 7"  (170.2 cm) Your current weight is: Weight: (!) 169.8 kg (374 lb 6.4 oz) Your Body Mass Index (BMI) is:  BMI (Calculated): 58.8  Following the type of diet  specifically designed for you will help prevent another stroke.  Your goal weight  is:  159 lbs  Your goal Body Mass Index (BMI) is 19-24.  Healthy food habits can help reduce 3 risk factors for stroke:  High cholesterol, hypertension, and excess weight.  RESOURCES Stroke/Support Group:  Call [phone]   STROKE EDUCATION PROVIDED/REVIEWED AND GIVEN TO PATIENT Stroke warning signs and symptoms How to activate emergency medical system (call 911). Medications prescribed at discharge. Need for follow-up after discharge. Personal risk factors for stroke. Pneumonia vaccine given:  Flu vaccine given:  My questions have been answered, the writing is legible, and I understand these instructions.  I will adhere to these goals & educational materials that have been provided to me after my discharge from the hospital.     My questions have been answered and I understand these instructions. I will adhere to these goals and the provided educational materials after my discharge from the hospital.  Patient/Caregiver Signature _______________________________ Date __________  Clinician Signature _______________________________________ Date __________  Please bring this form and your medication list with you to all your follow-up doctor's appointments.

## 2016-03-31 NOTE — Progress Notes (Signed)
Subjective/Complaints:  Excited about d/c  ROS: pt denies nausea, vomiting, diarrhea, cough, shortness of breath or chest pain   Objective: Vital Signs: Blood pressure (!) 120/54, pulse 64, temperature 99.1 F (37.3 C), temperature source Oral, resp. rate 20, height '5\' 7"'  (1.702 m), weight (!) 169.8 kg (374 lb 6.4 oz), SpO2 97 %. No results found. Results for orders placed or performed during the hospital encounter of 03/10/16 (from the past 72 hour(s))  Glucose, capillary     Status: Abnormal   Collection Time: 03/29/16  5:02 AM  Result Value Ref Range   Glucose-Capillary 115 (H) 65 - 99 mg/dL  Glucose, capillary     Status: Abnormal   Collection Time: 03/30/16  5:07 AM  Result Value Ref Range   Glucose-Capillary 109 (H) 65 - 99 mg/dL  Basic metabolic panel     Status: Abnormal   Collection Time: 03/31/16  5:01 AM  Result Value Ref Range   Sodium 139 135 - 145 mmol/L   Potassium 4.2 3.5 - 5.1 mmol/L   Chloride 108 101 - 111 mmol/L   CO2 26 22 - 32 mmol/L   Glucose, Bld 102 (H) 65 - 99 mg/dL   BUN 22 (H) 6 - 20 mg/dL   Creatinine, Ser 0.85 0.44 - 1.00 mg/dL   Calcium 9.1 8.9 - 10.3 mg/dL   GFR calc non Af Amer >60 >60 mL/min   GFR calc Af Amer >60 >60 mL/min    Comment: (NOTE) The eGFR has been calculated using the CKD EPI equation. This calculation has not been validated in all clinical situations. eGFR's persistently <60 mL/min signify possible Chronic Kidney Disease.    Anion gap 5 5 - 15  CBC     Status: Abnormal   Collection Time: 03/31/16  5:01 AM  Result Value Ref Range   WBC 6.0 4.0 - 10.5 K/uL   RBC 3.77 (L) 3.87 - 5.11 MIL/uL   Hemoglobin 11.0 (L) 12.0 - 15.0 g/dL   HCT 35.8 (L) 36.0 - 46.0 %   MCV 95.0 78.0 - 100.0 fL   MCH 29.2 26.0 - 34.0 pg   MCHC 30.7 30.0 - 36.0 g/dL   RDW 14.2 11.5 - 15.5 %   Platelets 197 150 - 400 K/uL     General: NAD. Vital signs reviewed. Morbid obesity Heart: RRR. No JVD.  Lungs: Clear to auscultation, breathing  Unlabored Abdomen: Positive bowel sounds, soft  Musculoskeletal: No edema. No tenderness Neurologic: Alert Motor 5/5 in bilateral deltoid, bicep, tricep, grip,4+/5 hip flexor, knee extensors, ankle dorsiflexor and plantar flexor Minimal LUE ataxia  Skin: No evidence of breakdown, no evidence of rash Psych: Mood and affect are appropriate   Assessment/Plan: 1. Functional deficits secondary to Left cerebellar infarct Stable for D/C today F/u PCP in 3-4 weeks F/u PM&R 2 weeks See D/C summary See D/C instructions FIM: Function - Bathing Position: Wheelchair/chair at sink Body parts bathed by patient: Right arm, Left arm, Chest, Abdomen, Right upper leg, Left upper leg, Front perineal area, Buttocks, Right lower leg, Left lower leg, Back Body parts bathed by helper: Buttocks Bathing not applicable: Right lower leg, Left lower leg, Back Assist Level: Set up Assistive Device Comment: with long handled sponge Set up : To obtain items  Function- Upper Body Dressing/Undressing What is the patient wearing?: Pull over shirt/dress Pull over shirt/dress - Perfomed by patient: Thread/unthread right sleeve, Thread/unthread left sleeve, Put head through opening, Pull shirt over trunk Assist Level: More than reasonable time Set  up : To obtain clothing/put away Function - Lower Body Dressing/Undressing What is the patient wearing?: Pants, Socks, Shoes, Underwear Position: Wheelchair/chair at sink Underwear - Performed by patient: Thread/unthread right underwear leg, Thread/unthread left underwear leg, Pull underwear up/down Underwear - Performed by helper: Pull underwear up/down Pants- Performed by patient: Thread/unthread right pants leg, Thread/unthread left pants leg, Pull pants up/down Pants- Performed by helper: Pull pants up/down Non-skid slipper socks- Performed by patient: Don/doff right sock, Don/doff left sock Non-skid slipper socks- Performed by helper: Don/doff right sock, Don/doff  left sock Socks - Performed by patient: Don/doff right sock, Don/doff left sock Shoes - Performed by patient: Don/doff right shoe, Don/doff left shoe Shoes - Performed by helper: Fasten right, Fasten left Assist for footwear: Setup Assist for lower body dressing: Set up Assistive Device Comment: with use of shoe funnel Set up : To obtain clothing/put away  Function - Toileting Toileting steps completed by patient: Adjust clothing prior to toileting, Performs perineal hygiene, Adjust clothing after toileting Toileting steps completed by helper: Adjust clothing prior to toileting, Performs perineal hygiene, Adjust clothing after toileting Toileting Assistive Devices: Grab bar or rail Assist level: Set up/obtain supplies  Function - Toilet Transfers Toilet transfer assistive device: Bedside commode, Environmental manager lift: Stedy Assist level to toilet: Supervision or verbal cues Assist level from toilet: Supervision or verbal cues Assist level to bedside commode (at bedside): Supervision or verbal cues Assist level from bedside commode (at bedside): Supervision or verbal cues  Function - Chair/bed transfer Chair/bed transfer method: Stand pivot Chair/bed transfer assist level: Supervision or verbal cues Chair/bed transfer assistive device: Armrests, Environmental manager lift: Stedy Chair/bed transfer details: Verbal cues for precautions/safety  Function - Locomotion: Wheelchair Will patient use wheelchair at discharge?: Yes Type: Manual Max wheelchair distance: 11f  Assist Level: No help, No cues, assistive device, takes more than reasonable amount of time Assist Level: No help, No cues, assistive device, takes more than reasonable amount of time Wheel 150 feet activity did not occur: Safety/medical concerns Assist Level: No help, No cues, assistive device, takes more than reasonable amount of time Turns around,maneuvers to table,bed, and toilet,negotiates 3% grade,maneuvers on rugs  and over doorsills: No Function - Locomotion: Ambulation Assistive device: Walker-rolling Max distance: 660f Assist level: Supervision or verbal cues Assist level: Supervision or verbal cues Walk 50 feet with 2 turns activity did not occur: Safety/medical concerns Assist level: Supervision or verbal cues Walk 150 feet activity did not occur: Safety/medical concerns Walk 10 feet on uneven surfaces activity did not occur: Safety/medical concerns Assist level: Touching or steadying assistance (Pt > 75%)  Function - Comprehension Comprehension: Auditory Comprehension assist level: Follows complex conversation/direction with no assist  Function - Expression Expression: Verbal Expression assist level: Expresses basic needs/ideas: With no assist  Function - Social Interaction Social Interaction assist level: Interacts appropriately with others with medication or extra time (anti-anxiety, antidepressant).  Function - Problem Solving Problem solving assist level: Solves complex problems: Recognizes & self-corrects  Function - Memory Memory assist level: Recognizes or recalls 90% of the time/requires cueing < 10% of the time Patient normally able to recall (first 3 days only): Current season, Location of own room, Staff names and faces, That he or she is in a hospital  Medical Problem List and Plan: 1. Ataxia and functional deficitssecondary to Left cerebellar infarct  plan D/C today 2. DVT Prophylaxis/Anticoagulation: Pharmaceutical: Lovenox 3. Pain Management: N/A 4. Mood: Team to provide ego support. LCSW to follow for evaluation  and support. Anxiety on Buspar and citalopram, gets dizzy with Buspar changed to PRN 5. Neuropsych: This patient iscapable of making decisions on herown behalf. Dr Sima Matas following for relaxation training- ?OP f/u will address at [facility] visit 6. Skin/Wound Care: Routine pressure relief measures.  7. Fluids/Electrolytes/Nutrition: Monitor I/O.    BMP within acceptable range on 2/16  Encourage fluids    qHS snack 8. HTN: Monitor BP bid. BP generally well controlled Vitals:   03/30/16 1535 03/31/16 0453  BP: 120/60 (!) 120/54  Pulse: 76 64  Resp: 18 20  Temp: 98.7 F (37.1 C) 99.1 F (37.3 C)    Resumed Lasix 2/9  Metoprolol 12.5 restarted 2/10 (home 50)  Controlled 2/19 9. OSA: Continue to encourage consistent use of CPAP 10. Morbid obesity: BMI 55.87. consult dietician to educate on CM/Heart healthy diet. Pt complains that she doesn't like her dietary choices                                                        11. Prediabetes: Hgb A1C-6.3. CM diet reinforced. Monitor BS ac/hs for trends.   12. CAD s/p CABG: On ASA and Lipitor  13 Constipation improved on senna S BID,  14. Mild ABLA  Hb 11.8 on 2/16  F/u OP labs at PCP  LOS (Days) 21 A FACE TO FACE EVALUATION WAS PERFORMED  Onnika Siebel E 03/31/2016, 8:00 AM

## 2016-04-03 NOTE — Telephone Encounter (Signed)
Left vm

## 2016-04-04 DIAGNOSIS — I69393 Ataxia following cerebral infarction: Secondary | ICD-10-CM | POA: Diagnosis not present

## 2016-04-04 DIAGNOSIS — Z7982 Long term (current) use of aspirin: Secondary | ICD-10-CM | POA: Diagnosis not present

## 2016-04-04 DIAGNOSIS — E785 Hyperlipidemia, unspecified: Secondary | ICD-10-CM | POA: Diagnosis not present

## 2016-04-04 DIAGNOSIS — I10 Essential (primary) hypertension: Secondary | ICD-10-CM | POA: Diagnosis not present

## 2016-04-04 DIAGNOSIS — G4733 Obstructive sleep apnea (adult) (pediatric): Secondary | ICD-10-CM | POA: Diagnosis not present

## 2016-04-04 DIAGNOSIS — I2510 Atherosclerotic heart disease of native coronary artery without angina pectoris: Secondary | ICD-10-CM | POA: Diagnosis not present

## 2016-04-04 DIAGNOSIS — Z87440 Personal history of urinary (tract) infections: Secondary | ICD-10-CM | POA: Diagnosis not present

## 2016-04-04 DIAGNOSIS — I69354 Hemiplegia and hemiparesis following cerebral infarction affecting left non-dominant side: Secondary | ICD-10-CM | POA: Diagnosis not present

## 2016-04-04 DIAGNOSIS — E119 Type 2 diabetes mellitus without complications: Secondary | ICD-10-CM | POA: Diagnosis not present

## 2016-04-07 DIAGNOSIS — I259 Chronic ischemic heart disease, unspecified: Secondary | ICD-10-CM | POA: Diagnosis not present

## 2016-04-07 DIAGNOSIS — I639 Cerebral infarction, unspecified: Secondary | ICD-10-CM | POA: Diagnosis not present

## 2016-04-07 DIAGNOSIS — G4733 Obstructive sleep apnea (adult) (pediatric): Secondary | ICD-10-CM | POA: Diagnosis not present

## 2016-04-07 DIAGNOSIS — I10 Essential (primary) hypertension: Secondary | ICD-10-CM | POA: Diagnosis not present

## 2016-04-26 ENCOUNTER — Encounter: Payer: Self-pay | Admitting: Nurse Practitioner

## 2016-04-26 ENCOUNTER — Ambulatory Visit (INDEPENDENT_AMBULATORY_CARE_PROVIDER_SITE_OTHER): Payer: Medicare Other | Admitting: Nurse Practitioner

## 2016-04-26 VITALS — BP 150/78 | HR 62 | Temp 97.8°F | Ht 67.0 in | Wt 345.0 lb

## 2016-04-26 DIAGNOSIS — I10 Essential (primary) hypertension: Secondary | ICD-10-CM

## 2016-04-26 DIAGNOSIS — I639 Cerebral infarction, unspecified: Secondary | ICD-10-CM

## 2016-04-26 MED ORDER — ATORVASTATIN CALCIUM 80 MG PO TABS: 80.0000 mg | ORAL_TABLET | Freq: Every day | ORAL | 1 refills | Status: DC

## 2016-04-26 MED ORDER — METOPROLOL TARTRATE 25 MG PO TABS: 12.5000 mg | ORAL_TABLET | Freq: Two times a day (BID) | ORAL | 1 refills | Status: DC

## 2016-04-26 MED ORDER — AMLODIPINE BESYLATE 2.5 MG PO TABS: 2.5000 mg | ORAL_TABLET | Freq: Every day | ORAL | 3 refills | Status: DC

## 2016-04-26 MED ORDER — CLOPIDOGREL BISULFATE 75 MG PO TABS: 75.0000 mg | ORAL_TABLET | Freq: Every day | ORAL | 1 refills | Status: DC

## 2016-04-26 NOTE — Progress Notes (Signed)
Pre visit review using our clinic review tool, if applicable. No additional management support is needed unless otherwise documented below in the visit note. 

## 2016-04-26 NOTE — Progress Notes (Signed)
Subjective:    Patient ID: Megan Atkinson, female    DOB: [birth year], 66 y.o.   MRN: [id number]  Patient presents today for establish care (new patient)  HPI CVA (new, 02/2016, embolic): Completed rehab and now at home with Home health. Lives alone but has family who visits frequency Reports sloww improvement in left side weakness and ataxia. Neurologist with [facility], has appt 04/21/16.   no pcp since 2012.  CAD with CABG: Sees cardiologist once a year.  HTN: Not at goal current use of metoprolol only  Immunizations: (TDAP, Hep C screen, Pneumovax, Influenza, zoster)  Health Maintenance  Topic Date Due  .  Hepatitis C: One time screening is recommended by Center for Disease Control  (CDC) for  adults born from [birth year] through 1967.   04-18-1950  . Complete foot exam   04/04/2012  . Urine Protein Check  04/04/2012  . Eye exam for diabetics  09/17/2012  . Mammogram  04/04/2013  . DEXA scan (bone density measurement)  05/21/2013  . Hemoglobin A1C  09/04/2016  . Pneumonia vaccines (2 of 2 - PCV13) 03/09/2017  . Colon Cancer Screening  04/04/2021  . Tetanus Vaccine  04/04/2021  . Flu Shot  Completed   Diet:regular Weight:  Wt Readings from Last 3 Encounters:  04/26/16 (!) 345 lb (156.5 kg)  03/10/16 (!) 374 lb 6.4 oz (169.8 kg)  03/06/16 (!) 356 lb 11.3 oz (161.8 kg)   Exercise:home exercise with home PT Fall Risk: Fall Risk  04/26/2016  Falls in the past year? No   Home Safety:home alone Depression/Suicide: Never used citalopram prescribed post hospitalization Depression screen [facility] 2/9 04/26/2016  Decreased Interest 0  Down, Depressed, Hopeless 0  PHQ - 2 Score 0   No flowsheet data found. Colonoscopy (every 5-76yrs, >50-60yrs):up to date Dexa (every 2-38yrs, >64yrs):needed Mammogram (yearly, >60yrs):needed Vision:up to date (report requested from patient) Dental:dentures Advanced Directive: Advanced Directives 03/10/2016  Does Patient Have a Medical  Advance Directive? No;Yes  Type of Sales executive of Healthcare Power of Attorney in Chart? No - copy requested  Would patient like information on creating a medical advance directive? -  Pre-existing out of facility DNR order (yellow form or pink MOST form) -   Medications and allergies reviewed with patient and updated if appropriate.  Patient Active Problem List   Diagnosis Date Noted  . Acute blood loss anemia   . Elevated serum creatinine   . Slow transit constipation   . Benign essential HTN   . Generalized anxiety disorder   . Gait disturbance, post-stroke 03/13/2016  . Cerebellar stroke, acute (HCC) 03/10/2016  . Super obese (HCC)   . Prediabetes   . Ataxia, post-stroke   . Acute cystitis without hematuria   . CVA (cerebral vascular accident) (HCC) 03/06/2016  . Morbid obesity (HCC) 02/29/2016  . OSA (obstructive sleep apnea) 11/24/2014  . Dyspnea 10/02/2014  . Anemia 04/05/2011  . Urinary tract infection, site not specified 04/05/2011  . Diabetes mellitus (HCC) 02/22/2011  . Hyperlipidemia 02/22/2011  . CAD (coronary artery disease) 01/07/2011  . Hypertension 01/07/2011    Current Outpatient Prescriptions on File Prior to Visit  Medication Sig Dispense Refill  . aspirin 325 MG tablet Take 1 tablet (325 mg total) by mouth daily. 100 tablet 0  . Calcium Carbonate-Vitamin D (CALCIUM-VITAMIN D) 500-200 MG-UNIT tablet Take 1 tablet by mouth daily.    . Cyanocobalamin (VITAMIN B-12 IJ) weekly    . fluticasone (FLONASE)  50 MCG/ACT nasal spray Place 1 spray into both nostrils daily. 9.9 g 0  . furosemide (LASIX) 20 MG tablet Take 0.5 tablets (10 mg total) by mouth daily. 30 tablet 0  . senna-docusate (SENOKOT-S) 8.6-50 MG tablet Take 2 tablets by mouth 2 (two) times daily. 100 tablet 0   No current facility-administered medications on file prior to visit.     Past Medical History:  Diagnosis Date  . CAD (coronary artery disease)      small NSTEMI 11/12: LHC 01/06/11:  LAD 90-95%, mD1 90%, pD2 50%,  pOM1 50%, RCA occluded,  EF > 65%;  CABG  12/12: L-LAD  . Diabetes mellitus   . Glucose intolerance (impaired glucose tolerance)    Hemoglobin A1c 6.4 in 12/2010  . HTN (hypertension)    Echo 01/06/11: EF 65%  . Hyperlipidemia   . Obesity   . Stroke [facility]) 03/06/2016    Past Surgical History:  Procedure Laterality Date  . ABDOMINAL HYSTERECTOMY    . APPENDECTOMY    . CORONARY ARTERY BYPASS GRAFT  01/12/2011   Procedure: CORONARY ARTERY BYPASS GRAFTING (CABG);  Surgeon: Alleen Borne, MD;  Location: [facility] OR;  Service: Open Heart Surgery;  Laterality: N/A;  coronary artery bypass graft times one using left internal mammary artery . Attempted endoscopic saphenous vein harvest  . LEFT HEART CATHETERIZATION WITH CORONARY ANGIOGRAM N/A 01/06/2011   Procedure: LEFT HEART CATHETERIZATION WITH CORONARY ANGIOGRAM;  Surgeon: Rollene Rotunda, MD;  Location: [facility] CATH LAB;  Service: Cardiovascular;  Laterality: N/A;    Social History   Social History  . Marital status: Single    Spouse name: N/A  . Number of children: 2  . Years of education: N/A   Occupational History  .  [employer]   Social History Main Topics  . Smoking status: Never Smoker  . Smokeless tobacco: Never Used  . Alcohol use Yes     Comment: Occasional  . Drug use: No  . Sexual activity: Not Asked   Other Topics Concern  . None   Social History Narrative  . None    Family History  Problem Relation Age of Onset  . Coronary artery disease      No family history  . Heart disease Mother   . Heart disease Father   . Heart disease Maternal Grandmother         Review of Systems  Constitutional: Negative for fever, malaise/fatigue and weight loss.  HENT: Negative for congestion and sore throat.   Eyes:       Negative for visual changes  Respiratory: Negative for cough and shortness of breath.   Cardiovascular: Positive for leg swelling. Negative for  chest pain and palpitations.       Stable edema with prn use of lasix.  Gastrointestinal: Negative for blood in stool, constipation, diarrhea and heartburn.  Genitourinary: Negative for dysuria, frequency and urgency.  Musculoskeletal: Negative for falls, joint pain and myalgias.  Skin: Negative for rash.  Neurological: Negative for dizziness, sensory change and headaches.  Endo/Heme/Allergies: Does not bruise/bleed easily.  Psychiatric/Behavioral: Negative for depression, substance abuse and suicidal ideas. The patient is not nervous/anxious and does not have insomnia.     Objective:   Vitals:   04/26/16 1115  BP: (!) 150/78  Pulse: 62  Temp: 97.8 F (36.6 C)    Body mass index is 54.03 kg/m.   Physical Examination:  Physical Exam  Constitutional: She is oriented to person, place, and time. No distress.  Cardiovascular:  Normal rate, regular rhythm and normal heart sounds.   Pulmonary/Chest: Effort normal and breath sounds normal.  Abdominal: Soft. Bowel sounds are normal.  Musculoskeletal: She exhibits edema. She exhibits no tenderness.  Neurological: She is alert and oriented to person, place, and time.  Ambulates with walker  Vitals reviewed.   ASSESSMENT and PLAN:  Megan Atkinson was seen today for establish care.  Diagnoses and all orders for this visit:  Essential hypertension -     metoprolol tartrate (LOPRESSOR) 25 MG tablet; Take 0.5 tablets (12.5 mg total) by mouth 2 (two) times daily. -     amLODipine (NORVASC) 2.5 MG tablet; Take 1 tablet (2.5 mg total) by mouth daily.  Cerebellar stroke, acute (HCC) -     atorvastatin (LIPITOR) 80 MG tablet; Take 1 tablet (80 mg total) by mouth daily at 6 PM. -     clopidogrel (PLAVIX) 75 MG tablet; Take 1 tablet (75 mg total) by mouth daily. -     metoprolol tartrate (LOPRESSOR) 25 MG tablet; Take 0.5 tablets (12.5 mg total) by mouth 2 (two) times daily.   No problem-specific Assessment & Plan notes found for this  encounter.     Follow up: Return in about 1 month (around 05/27/2016) for CPE (fasting).  Alysia Pennaharlotte Yarnell Arvidson, NP

## 2016-04-27 ENCOUNTER — Encounter: Payer: Self-pay | Admitting: Physical Medicine & Rehabilitation

## 2016-04-27 ENCOUNTER — Encounter: Payer: Medicare Other | Attending: Physical Medicine & Rehabilitation

## 2016-04-27 ENCOUNTER — Ambulatory Visit (HOSPITAL_BASED_OUTPATIENT_CLINIC_OR_DEPARTMENT_OTHER): Payer: Medicare Other | Admitting: Physical Medicine & Rehabilitation

## 2016-04-27 VITALS — BP 149/79 | HR 62 | Resp 14

## 2016-04-27 DIAGNOSIS — Z955 Presence of coronary angioplasty implant and graft: Secondary | ICD-10-CM | POA: Insufficient documentation

## 2016-04-27 DIAGNOSIS — E785 Hyperlipidemia, unspecified: Secondary | ICD-10-CM | POA: Diagnosis not present

## 2016-04-27 DIAGNOSIS — Z951 Presence of aortocoronary bypass graft: Secondary | ICD-10-CM | POA: Diagnosis not present

## 2016-04-27 DIAGNOSIS — E669 Obesity, unspecified: Secondary | ICD-10-CM | POA: Diagnosis not present

## 2016-04-27 DIAGNOSIS — Z8249 Family history of ischemic heart disease and other diseases of the circulatory system: Secondary | ICD-10-CM | POA: Diagnosis not present

## 2016-04-27 DIAGNOSIS — Z90710 Acquired absence of both cervix and uterus: Secondary | ICD-10-CM | POA: Diagnosis not present

## 2016-04-27 DIAGNOSIS — I69393 Ataxia following cerebral infarction: Secondary | ICD-10-CM | POA: Diagnosis not present

## 2016-04-27 DIAGNOSIS — I2510 Atherosclerotic heart disease of native coronary artery without angina pectoris: Secondary | ICD-10-CM | POA: Insufficient documentation

## 2016-04-27 DIAGNOSIS — E119 Type 2 diabetes mellitus without complications: Secondary | ICD-10-CM | POA: Insufficient documentation

## 2016-04-27 DIAGNOSIS — I10 Essential (primary) hypertension: Secondary | ICD-10-CM | POA: Diagnosis not present

## 2016-04-27 NOTE — Patient Instructions (Signed)
May resume driving in April  Graduated return to driving instructions were provided. It is recommended that the patient first drives with another licensed driver in an empty parking lot. If the patient does well with this, and they can drive on a quiet street with the licensed driver. If the patient does well with this they can drive on a busy street with a licensed driver. If the patient does well with this, the next time out they can go by himself. For the first month after resuming driving, I recommend no nighttime or Interstate driving.

## 2016-04-27 NOTE — Progress Notes (Signed)
Subjective:    Patient ID: Megan Atkinson, female    DOB: Mar 08, 1950, 66 y.o.   MRN: [id number]  66 y.o.femalewith history of CAD, HTN, morbid obesity who was admitted on 03/06/16 with vertigo and left sided weakness with numbness. CT head reviewed, showing left cerebellar infarct,thought to be subacute. TPA not administered. CTA head/neck no significant stenosis in neck and moderate focal stenosis left VA. 2D echo with eF 60-65% with mild to moderately calcified aortic valve and grade I diastolic dysfunction. BLE dopplers negative for DVT. Dr. Roda Shutters felt that stroke due to atherosclerosis and multiple risk factors. He recommended ASA/Plavix for three months followed by plavix alone. MBS done 1/30 revealing mild pharyngeal dysphagia with penetration of thin therefore chin tuck with thin liquids recommended. Swallow re-eval done today showing improvement and no need for chin tuck. Patient with resultant visual deficits with vestibular issues, difficulty with weight shifting, stooped posture and left lateral lean with ambulation Admit date: 03/10/2016 Discharge date: 03/31/2016  HPI Seen by PCP started on Norvasc yesterday PT HH walking with walker in home, goes up steps to bedroom at home.   HHOT is finishing up Mod I ADLs and meal prep HHRN for BP,no other skilled RN needs No falls, very careful Bowel and bladder ok  Pain Inventory Average Pain 1 Pain Right Now 0 My pain is burning  In the last 24 hours, has pain interfered with the following? General activity 1 Relation with others 0 Enjoyment of life 0 What TIME of day is your pain at its worst? night Sleep (in general) Good  Pain is worse with: walking Pain improves with: rest Relief from Meds: very little pain  Mobility walk with assistance use a walker ability to climb steps?  yes do you drive?  yes use a wheelchair  Function retired I need assistance with the following:  household duties  Neuro/Psych trouble  walking  Prior Studies hospital follow up  Physicians involved in your care hospital follow up   Family History  Problem Relation Age of Onset  . Coronary artery disease      No family history  . Heart disease Mother   . Heart disease Father   . Heart disease Maternal Grandmother    Social History   Social History  . Marital status: Single    Spouse name: N/A  . Number of children: 2  . Years of education: N/A   Occupational History  .  [employer]   Social History Main Topics  . Smoking status: Never Smoker  . Smokeless tobacco: Never Used  . Alcohol use Yes     Comment: Occasional  . Drug use: No  . Sexual activity: Not Asked   Other Topics Concern  . None   Social History Narrative  . None   Past Surgical History:  Procedure Laterality Date  . ABDOMINAL HYSTERECTOMY    . APPENDECTOMY    . CORONARY ARTERY BYPASS GRAFT  01/12/2011   Procedure: CORONARY ARTERY BYPASS GRAFTING (CABG);  Surgeon: Alleen Borne, MD;  Location: [facility] OR;  Service: Open Heart Surgery;  Laterality: N/A;  coronary artery bypass graft times one using left internal mammary artery . Attempted endoscopic saphenous vein harvest  . LEFT HEART CATHETERIZATION WITH CORONARY ANGIOGRAM N/A 01/06/2011   Procedure: LEFT HEART CATHETERIZATION WITH CORONARY ANGIOGRAM;  Surgeon: Rollene Rotunda, MD;  Location: [facility] CATH LAB;  Service: Cardiovascular;  Laterality: N/A;   Past Medical History:  Diagnosis Date  . CAD (coronary artery disease)  small NSTEMI 11/12: LHC 01/06/11:  LAD 90-95%, mD1 90%, pD2 50%,  pOM1 50%, RCA occluded,  EF > 65%;  CABG  12/12: L-LAD  . Diabetes mellitus   . Glucose intolerance (impaired glucose tolerance)    Hemoglobin A1c 6.4 in 12/2010  . HTN (hypertension)    Echo 01/06/11: EF 65%  . Hyperlipidemia   . Obesity   . Stroke (HCC) 03/06/2016   BP (!) 149/79   Pulse 62   Resp 14   SpO2 95%   Opioid Risk Score:   Fall Risk Score:  `1  Depression screen PHQ  2/9  Depression screen PHQ 2/9 04/26/2016  Decreased Interest 0  Down, Depressed, Hopeless 0  PHQ - 2 Score 0    Review of Systems  Constitutional: Negative.   HENT: Negative.   Eyes: Negative.   Respiratory: Positive for apnea.   Cardiovascular: Negative.   Gastrointestinal: Negative.   Endocrine: Negative.   Genitourinary: Negative.   Musculoskeletal: Positive for gait problem and myalgias.  Skin: Negative.   Allergic/Immunologic: Negative.   Hematological: Negative.   Psychiatric/Behavioral: Negative.   All other systems reviewed and are negative.      Objective:   Physical Exam  Constitutional: She is oriented to person, place, and time. She appears well-developed.  obese  HENT:  Head: Normocephalic and atraumatic.  Eyes: Conjunctivae are normal. Pupils are equal, round, and reactive to light.  Cardiovascular: Normal rate, regular rhythm and normal heart sounds.   Pulmonary/Chest: Breath sounds normal. She is in respiratory distress.  Abdominal: Soft. Bowel sounds are normal. She exhibits no distension. There is no tenderness.  Neurological: She is alert and oriented to person, place, and time.  Left FNF mild dysmetria Stands with wide base leans to Left   Psychiatric: She has a normal mood and affect.  Nursing note and vitals reviewed.         Assessment & Plan:  1. Left cerebellar infarct with residual gait disorder and mild left hemiataxia She will complete home health, PT, at the end of the month. She is now independent with her self-care with the exception of more complicated meal prep. Have written orders to start outpatient PT in April. May resume driving in April. Return to clinic in 6 weeks to assess readiness for return to work

## 2016-05-08 DIAGNOSIS — I639 Cerebral infarction, unspecified: Secondary | ICD-10-CM | POA: Diagnosis not present

## 2016-05-08 DIAGNOSIS — I259 Chronic ischemic heart disease, unspecified: Secondary | ICD-10-CM | POA: Diagnosis not present

## 2016-05-08 DIAGNOSIS — G4733 Obstructive sleep apnea (adult) (pediatric): Secondary | ICD-10-CM | POA: Diagnosis not present

## 2016-05-08 DIAGNOSIS — I10 Essential (primary) hypertension: Secondary | ICD-10-CM | POA: Diagnosis not present

## 2016-05-10 ENCOUNTER — Encounter: Payer: Self-pay | Admitting: Rehabilitation

## 2016-05-10 ENCOUNTER — Ambulatory Visit: Payer: Medicare Other | Attending: Physical Medicine & Rehabilitation | Admitting: Rehabilitation

## 2016-05-10 DIAGNOSIS — I638 Other cerebral infarction: Secondary | ICD-10-CM | POA: Insufficient documentation

## 2016-05-10 DIAGNOSIS — R2689 Other abnormalities of gait and mobility: Secondary | ICD-10-CM

## 2016-05-10 DIAGNOSIS — I639 Cerebral infarction, unspecified: Secondary | ICD-10-CM | POA: Insufficient documentation

## 2016-05-10 DIAGNOSIS — M6281 Muscle weakness (generalized): Secondary | ICD-10-CM | POA: Diagnosis present

## 2016-05-10 DIAGNOSIS — Z7409 Other reduced mobility: Secondary | ICD-10-CM

## 2016-05-10 DIAGNOSIS — F411 Generalized anxiety disorder: Secondary | ICD-10-CM | POA: Insufficient documentation

## 2016-05-10 NOTE — Patient Instructions (Signed)
To continue current HHPT exercises for now.  No driving without other person present and to stop driving if feeling unsafe at all

## 2016-05-10 NOTE — Therapy (Signed)
[facility] [place] [address] [place], [place], [address] Phone: [phone]   Fax:  [phone]  Physical Therapy Evaluation  Patient Details  Name: Megan Atkinson MRN: [id number] Date of Birth: [birth year] Referring Provider: Claudette Laws  Encounter Date: 05/10/2016      PT End of Session - 05/10/16 1458    Visit Number 1   Date for PT Re-Evaluation 07/05/16   PT Start Time 1312   PT Stop Time 1400   PT Time Calculation (min) 48 min   Activity Tolerance Patient tolerated treatment well      Past Medical History:  Diagnosis Date  . CAD (coronary artery disease)    small NSTEMI 11/12: LHC 01/06/11:  LAD 90-95%, mD1 90%, pD2 50%,  pOM1 50%, RCA occluded,  EF > 65%;  CABG  12/12: L-LAD  . Diabetes mellitus   . Glucose intolerance (impaired glucose tolerance)    Hemoglobin A1c 6.4 in 12/2010  . HTN (hypertension)    Echo 01/06/11: EF 65%  . Hyperlipidemia   . Obesity   . Stroke [facility]) 03/06/2016    Past Surgical History:  Procedure Laterality Date  . ABDOMINAL HYSTERECTOMY    . APPENDECTOMY    . CORONARY ARTERY BYPASS GRAFT  01/12/2011   Procedure: CORONARY ARTERY BYPASS GRAFTING (CABG);  Surgeon: Alleen Borne, MD;  Location: [facility] OR;  Service: Open Heart Surgery;  Laterality: N/A;  coronary artery bypass graft times one using left internal mammary artery . Attempted endoscopic saphenous vein harvest  . LEFT HEART CATHETERIZATION WITH CORONARY ANGIOGRAM N/A 01/06/2011   Procedure: LEFT HEART CATHETERIZATION WITH CORONARY ANGIOGRAM;  Surgeon: Rollene Rotunda, MD;  Location[facility] CATH LAB;  Service: Cardiovascular;  Laterality: N/A;    There were no vitals filed for this visit.       Subjective Assessment - 05/10/16 1311    Subjective Pt presents post cerebellar stroke in January.  In nursing home x 3 weeks and then home.  Reports she is starting to get around well.  Has a tri level home.  Started driving last  week.  Friend says she is veering to the right.  Discussed safety with patient and friend.  Hoping to be going back to work as soon as possible.     Patient is accompained by: --  Friend   Pertinent History HTN   Limitations Walking;House hold activities   How long can you walk comfortably? 3 minutes   Patient Stated Goals Improve stair mobility carrying things, getting in and out of the car, walking with a SPC or nothing,    Currently in Pain? No/denies            [facility] PT Assessment - 05/10/16 0001      Assessment   Medical Diagnosis stroke   Referring Provider Claudette Laws   Onset Date/Surgical Date 03/05/16   Next MD Visit 6 week point   Prior Therapy HHPT     Precautions   Precautions Fall     Balance Screen   Has the patient fallen in the past 6 months No   Has the patient had a decrease in activity level because of a fear of falling?  Yes   Is the patient reluctant to leave their home because of a fear of falling?  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  Reports she can sit for most of the job     Observation/Other Assessments   Focus on Therapeutic Outcomes (FOTO)  51%     ROM / Strength   AROM / PROM / Strength Strength     Strength   Overall Strength Comments seated testing; strong LEs except hip flexion at 3/5     Transfers   Transfers Sit to Stand;Stand Pivot Transfers   Sit to Stand 5: Supervision   Five time sit to stand comments  30sec   Stand Pivot Transfers 5: Supervision     Ambulation/Gait   Ambulation Distance (Feet) 150 Feet   Assistive device Rolling walker   Gait Pattern Shuffle     6 Minute Walk- Baseline   6 Minute Walk- Baseline yes   Modified Borg Scale for Dyspnea 0.5- Very,  very slight shortness of breath   Perceived Rate of Exertion (Borg) 12-     6 minute walk test results    Aerobic Endurance Distance Walked 150   Endurance additional comments with RW and 1 rest break                   [facility] Adult PT Treatment/Exercise - 05/10/16 0001      Exercises   Exercises Lumbar;Knee/Hip     Knee/Hip Exercises: Aerobic   Nustep level 1x26min LE only                PT Education - 05/10/16 1457    Education provided Yes   Education Details , POC   Person(s) Educated Patient   Methods Explanation   Comprehension Verbalized understanding          PT Short Term Goals - 05/10/16 1501      PT SHORT TERM GOAL #1   Title pt will perform Berg balance test for balance screen   Time 2   Period Weeks   Status New           PT Long Term Goals - 05/10/16 1501      PT LONG TERM GOAL #1   Title Pt will improve walk to 500 feet with LRAD   Time 8   Period Weeks   Status New     PT LONG TERM GOAL #2   Title Pt will perform sit to stand x 5 in 45seconds   Time 8   Period Weeks   Status New     PT LONG TERM GOAL #3   Title pt will report the ability to walk up the stairs at home carrying an object   Time 8   Period Weeks   Status New     PT LONG TERM GOAL #4   Title pt will report return to work readiness   Time 8   Period Weeks   Status New               Plan - 05/10/16 1458    Clinical Impression Statement Pt presents 2.5 months after cerebellar stroke resulting in decreased endurance, strength, and mobility.  Pt reports independence with most activities at home/sef-care, but reports decreased endurance and walking tolerance, trouble walking up the stairs with anything in the hands, getting in and out of the car.  Balanced not formally assessed today but pt reports no feelings of LOB at home.     Rehab  Potential Good   PT Frequency --  2-3x per week   PT Duration 8 weeks   PT Treatment/Interventions  Electrical Stimulation;Stair training;Gait training;Therapeutic activities;Therapeutic exercise;Neuromuscular re-education;Patient/family education;Passive range of motion   PT Next Visit Plan gait, endurance, strengthening of the LEs   Consulted and Agree with Plan of Care Patient      Patient will benefit from skilled therapeutic intervention in order to improve the following deficits and impairments:  Decreased activity tolerance, Decreased strength, Difficulty walking, Decreased balance, Decreased endurance, Abnormal gait  Visit Diagnosis: Other abnormalities of gait and mobility - Plan: PT plan of care cert/re-cert  Decreased functional mobility and endurance - Plan: PT plan of care cert/re-cert  Muscle weakness (generalized) - Plan: PT plan of care cert/re-cert      G-Codes - Jun 09, 2016 1505    Functional Assessment Tool Used (Outpatient Only) FOTO   Functional Limitation Mobility: Walking and moving around   Mobility: Walking and Moving Around Current Status ([id number]) At least 40 percent but less than 60 percent impaired, limited or restricted   Mobility: Walking and Moving Around Goal Status [phone]) At least 20 percent but less than 40 percent impaired, limited or restricted       Problem List Patient Active Problem List   Diagnosis Date Noted  . Acute blood loss anemia   . Elevated serum creatinine   . Slow transit constipation   . Benign essential HTN   . Generalized anxiety disorder   . Gait disturbance, post-stroke 03/13/2016  . Cerebellar stroke, acute (HCC) 03/10/2016  . Super obese (HCC)   . Prediabetes   . Ataxia, post-stroke   . Acute cystitis without hematuria   . CVA (cerebral vascular accident) (HCC) 03/06/2016  . Morbid obesity (HCC) 02/29/2016  . OSA (obstructive sleep apnea) 11/24/2014  . Dyspnea 10/02/2014  . Anemia 04/05/2011  . Urinary tract infection, site not specified 04/05/2011  . Diabetes mellitus (HCC) 02/22/2011  . Hyperlipidemia 02/22/2011   . CAD (coronary artery disease) 01/07/2011  . Hypertension 01/07/2011    Idamae Lusher 06/09/2016, 3:08 PM  [facility] [place] [address] [place], [place], [address] Phone: [phone]   Fax:  [phone]  Name: Megan Atkinson MRN: [id number] Date of Birth: [birth year]

## 2016-05-15 ENCOUNTER — Ambulatory Visit: Payer: Medicare Other | Admitting: Physical Therapy

## 2016-05-15 ENCOUNTER — Encounter: Payer: Self-pay | Admitting: Physical Therapy

## 2016-05-15 ENCOUNTER — Ambulatory Visit: Payer: Medicare Other | Admitting: Rehabilitation

## 2016-05-15 DIAGNOSIS — R2689 Other abnormalities of gait and mobility: Secondary | ICD-10-CM | POA: Diagnosis not present

## 2016-05-15 DIAGNOSIS — M6281 Muscle weakness (generalized): Secondary | ICD-10-CM

## 2016-05-15 DIAGNOSIS — Z7409 Other reduced mobility: Secondary | ICD-10-CM

## 2016-05-15 NOTE — Therapy (Signed)
[facility] [place] [address] [place], [place], [address] Phone: [phone]   Fax:  [phone]  Physical Therapy Treatment  Patient Details  Name: Megan Atkinson MRN: [id number] Date of Birth: [birth year] Referring Provider: Claudette Laws  Encounter Date: 05/15/2016      PT End of Session - 05/15/16 1230    Visit Number 2   Date for PT Re-Evaluation 07/05/16   PT Start Time 1150   PT Stop Time 1231   PT Time Calculation (min) 41 min   Activity Tolerance Patient limited by fatigue;Patient tolerated treatment well   Behavior During Therapy [facility] for tasks assessed/performed      Past Medical History:  Diagnosis Date  . CAD (coronary artery disease)    small NSTEMI 11/12: LHC 01/06/11:  LAD 90-95%, mD1 90%, pD2 50%,  pOM1 50%, RCA occluded,  EF > 65%;  CABG  12/12: L-LAD  . Diabetes mellitus   . Glucose intolerance (impaired glucose tolerance)    Hemoglobin A1c 6.4 in 12/2010  . HTN (hypertension)    Echo 01/06/11: EF 65%  . Hyperlipidemia   . Obesity   . Stroke [facility]) 03/06/2016    Past Surgical History:  Procedure Laterality Date  . ABDOMINAL HYSTERECTOMY    . APPENDECTOMY    . CORONARY ARTERY BYPASS GRAFT  01/12/2011   Procedure: CORONARY ARTERY BYPASS GRAFTING (CABG);  Surgeon: Alleen Borne, MD;  Location: [facility] OR;  Service: Open Heart Surgery;  Laterality: N/A;  coronary artery bypass graft times one using left internal mammary artery . Attempted endoscopic saphenous vein harvest  . LEFT HEART CATHETERIZATION WITH CORONARY ANGIOGRAM N/A 01/06/2011   Procedure: LEFT HEART CATHETERIZATION WITH CORONARY ANGIOGRAM;  Surgeon: Rollene Rotunda, MD;  Location: [facility] CATH LAB;  Service: Cardiovascular;  Laterality: N/A;    There were no vitals filed for this visit.      Subjective Assessment - 05/15/16 1153    Subjective "OK, I have not fallen or anything so Im doing ok. "   Currently in Pain? No/denies   Pain  Score 0-No pain                         [facility] Adult PT Treatment/Exercise - 05/15/16 0001      Ambulation/Gait   Ambulation/Gait Yes   Ambulation Distance (Feet) 180 Feet   Assistive device Rolling walker   Gait Pattern Shuffle   Ambulation Surface Level;Unlevel;Indoor;Outdoor;Paved   Gait Comments 4 stand rest breaks      Lumbar Exercises: Supine   Other Supine Lumbar Exercises Seated Rows blue Tband 2x15; Horiz shoulder abductions red band x10   Other Supine Lumbar Exercises Seated OHP with red ball 2x10; Seated trunk rotations with red ball 2x10 .     Knee/Hip Exercises: Aerobic   Nustep L4 x      Knee/Hip Exercises: Seated   Long Arc Quad Both;2 sets;10 reps   Long Arc Quad Weight 3 lbs.   Marching Limitations 2x10    Marching Weights 3 lbs.                  PT Short Term Goals - 05/10/16 1501      PT SHORT TERM GOAL #1   Title pt will perform Berg balance test for balance screen   Time 2   Period Weeks   Status New           PT Long Term Goals -  05/10/16 1501      PT LONG TERM GOAL #1   Title Pt will improve walk to 500 feet with LRAD   Time 8   Period Weeks   Status New     PT LONG TERM GOAL #2   Title Pt will perform sit to stand x 5 in 45seconds   Time 8   Period Weeks   Status New     PT LONG TERM GOAL #3   Title pt will report the ability to walk up the stairs at home carrying an object   Time 8   Period Weeks   Status New     PT LONG TERM GOAL #4   Title pt will report return to work readiness   Time 8   Period Weeks   Status New               Plan - 05/15/16 1231    Clinical Impression Statement Pt report tolerated an initial progression to TE well. Pt reports no increase in pain but does fatigue quick. Cues given for posture during seated exercises. Pt able to increase gait distance but with 3 to 4 standing rest breaks. Gait was outside to pt car, pt required mod assist to lift RLE on to  step of SUV.   Rehab Potential Good   PT Treatment/Interventions Quarry manager;Therapeutic activities;Therapeutic exercise;Neuromuscular re-education;Patient/family education;Passive range of motion   PT Next Visit Plan gait, endurance, strengthening of the LEs      Patient will benefit from skilled therapeutic intervention in order to improve the following deficits and impairments:  Decreased activity tolerance, Decreased strength, Difficulty walking, Decreased balance, Decreased endurance, Abnormal gait  Visit Diagnosis: Muscle weakness (generalized)  Other abnormalities of gait and mobility  Decreased functional mobility and endurance     Problem List Patient Active Problem List   Diagnosis Date Noted  . Acute blood loss anemia   . Elevated serum creatinine   . Slow transit constipation   . Benign essential HTN   . Generalized anxiety disorder   . Gait disturbance, post-stroke 03/13/2016  . Cerebellar stroke, acute (HCC) 03/10/2016  . Super obese (HCC)   . Prediabetes   . Ataxia, post-stroke   . Acute cystitis without hematuria   . CVA (cerebral vascular accident) (HCC) 03/06/2016  . Morbid obesity (HCC) 02/29/2016  . OSA (obstructive sleep apnea) 11/24/2014  . Dyspnea 10/02/2014  . Anemia 04/05/2011  . Urinary tract infection, site not specified 04/05/2011  . Diabetes mellitus (HCC) 02/22/2011  . Hyperlipidemia 02/22/2011  . CAD (coronary artery disease) 01/07/2011  . Hypertension 01/07/2011    Grayce Sessions, PTA 05/15/2016, 12:34 PM  [facility] [place] [address] [place], [place], [address] Phone: [phone]   Fax:  [phone]  Name: Megan Atkinson MRN: [id number] Date of Birth: 08-05-1950

## 2016-05-17 ENCOUNTER — Ambulatory Visit: Payer: Medicare Other | Admitting: Physical Therapy

## 2016-05-17 ENCOUNTER — Encounter: Payer: Self-pay | Admitting: Physical Therapy

## 2016-05-17 DIAGNOSIS — R2689 Other abnormalities of gait and mobility: Secondary | ICD-10-CM | POA: Diagnosis not present

## 2016-05-17 DIAGNOSIS — Z7409 Other reduced mobility: Secondary | ICD-10-CM

## 2016-05-17 DIAGNOSIS — M6281 Muscle weakness (generalized): Secondary | ICD-10-CM

## 2016-05-17 NOTE — Therapy (Signed)
[facility] [place] [address] [place], [place], [address] Phone: [phone]   Fax:  [phone]  Physical Therapy Treatment  Patient Details  Name: Megan Atkinson MRN: [id number] Date of Birth: 12-Feb-1950 Referring Provider: Claudette Laws  Encounter Date: 05/17/2016      PT End of Session - 05/17/16 1541    Visit Number 3   Date for PT Re-Evaluation 07/05/16   PT Start Time 1350   PT Stop Time 1426   PT Time Calculation (min) 36 min      Past Medical History:  Diagnosis Date  . CAD (coronary artery disease)    small NSTEMI 11/12: LHC 01/06/11:  LAD 90-95%, mD1 90%, pD2 50%,  pOM1 50%, RCA occluded,  EF > 65%;  CABG  12/12: L-LAD  . Diabetes mellitus   . Glucose intolerance (impaired glucose tolerance)    Hemoglobin A1c 6.4 in 12/2010  . HTN (hypertension)    Echo 01/06/11: EF 65%  . Hyperlipidemia   . Obesity   . Stroke [facility]) 03/06/2016    Past Surgical History:  Procedure Laterality Date  . ABDOMINAL HYSTERECTOMY    . APPENDECTOMY    . CORONARY ARTERY BYPASS GRAFT  01/12/2011   Procedure: CORONARY ARTERY BYPASS GRAFTING (CABG);  Surgeon: Alleen Borne, MD;  Location: [facility] OR;  Service: Open Heart Surgery;  Laterality: N/A;  coronary artery bypass graft times one using left internal mammary artery . Attempted endoscopic saphenous vein harvest  . LEFT HEART CATHETERIZATION WITH CORONARY ANGIOGRAM N/A 01/06/2011   Procedure: LEFT HEART CATHETERIZATION WITH CORONARY ANGIOGRAM;  Surgeon: Rollene Rotunda, MD;  Location: [facility] CATH LAB;  Service: Cardiovascular;  Laterality: N/A;    There were no vitals filed for this visit.      Subjective Assessment - 05/17/16 1348    Subjective Pt. reports no pain but was having difficulty getting in the truck today. Couldn't lift her legs or stand long enough to get into the truck.    Currently in Pain? No/denies   Pain Score 0-No pain                          [facility] Adult PT Treatment/Exercise - 05/17/16 0001      Ambulation/Gait   Ambulation/Gait Yes   Ambulation Distance (Feet) 100 Feet   Ambulation Surface Level;Indoor   Gait Comments 3 stand rest breaks     Lumbar Exercises: Aerobic   Stationary Bike Nustep lvl 2 4 min   UBE (Upper Arm Bike) fwd bk     Lumbar Exercises: Standing   Other Standing Lumbar Exercises Seated lumbar extension with red band 2x10     Lumbar Exercises: Supine   Other Supine Lumbar Exercises seated twisting chops with red ball 2x10   Other Supine Lumbar Exercises seated chest press 4lbs; seated      Knee/Hip Exercises: Standing   Other Standing Knee Exercises marching/standing 3x5     Knee/Hip Exercises: Seated   Hamstring Curl 2 sets;10 reps   Hamstring Limitations red theraband   Sit to Sand 2 sets;5 reps  on airex cushion                  PT Short Term Goals - 05/10/16 1501      PT SHORT TERM GOAL #1   Title pt will perform Berg balance test for balance screen   Time 2   Period Weeks   Status  New           PT Long Term Goals - 05/10/16 1501      PT LONG TERM GOAL #1   Title Pt will improve walk to 500 feet with LRAD   Time 8   Period Weeks   Status New     PT LONG TERM GOAL #2   Title Pt will perform sit to stand x 5 in 45seconds   Time 8   Period Weeks   Status New     PT LONG TERM GOAL #3   Title pt will report the ability to walk up the stairs at home carrying an object   Time 8   Period Weeks   Status New     PT LONG TERM GOAL #4   Title pt will report return to work readiness   Time 8   Period Weeks   Status New               Plan - 05/17/16 1543    Clinical Impression Statement Pt. was able to complete all exercises well today. Required VC for posture and proper technique during seated exercises. Pt had elevated shoulders, cues needed to relax during ambulation with walker.  Circumduction in right leg while ambulating with walker  on level surface.   Rehab Potential Good   PT Duration 8 weeks   PT Treatment/Interventions Electrical Stimulation;Stair training;Gait training;Therapeutic activities;Therapeutic exercise;Neuromuscular re-education;Patient/family education;Passive range of motion   PT Next Visit Plan gait, endurance, strengthening of the LEs      Patient will benefit from skilled therapeutic intervention in order to improve the following deficits and impairments:  Decreased activity tolerance, Decreased strength, Difficulty walking, Decreased balance, Decreased endurance, Abnormal gait  Visit Diagnosis: No diagnosis found.     Problem List Patient Active Problem List   Diagnosis Date Noted  . Acute blood loss anemia   . Elevated serum creatinine   . Slow transit constipation   . Benign essential HTN   . Generalized anxiety disorder   . Gait disturbance, post-stroke 03/13/2016  . Cerebellar stroke, acute (HCC) 03/10/2016  . Super obese (HCC)   . Prediabetes   . Ataxia, post-stroke   . Acute cystitis without hematuria   . CVA (cerebral vascular accident) (HCC) 03/06/2016  . Morbid obesity (HCC) 02/29/2016  . OSA (obstructive sleep apnea) 11/24/2014  . Dyspnea 10/02/2014  . Anemia 04/05/2011  . Urinary tract infection, site not specified 04/05/2011  . Diabetes mellitus (HCC) 02/22/2011  . Hyperlipidemia 02/22/2011  . CAD (coronary artery disease) 01/07/2011  . Hypertension 01/07/2011    Forde Radon, SPTA 05/17/2016, 3:51 PM  [facility] [place] [address] [place], [place], [address] Phone: [phone]   Fax:  [phone]  Name: Megan Atkinson MRN: [id number] Date of Birth: 1950-12-30

## 2016-05-19 ENCOUNTER — Encounter: Payer: Self-pay | Admitting: Physical Therapy

## 2016-05-19 ENCOUNTER — Ambulatory Visit: Payer: Medicare Other | Admitting: Physical Therapy

## 2016-05-19 DIAGNOSIS — R2689 Other abnormalities of gait and mobility: Secondary | ICD-10-CM | POA: Diagnosis not present

## 2016-05-19 DIAGNOSIS — M6281 Muscle weakness (generalized): Secondary | ICD-10-CM

## 2016-05-19 DIAGNOSIS — Z7409 Other reduced mobility: Secondary | ICD-10-CM

## 2016-05-19 NOTE — Therapy (Signed)
[facility] [address] [place], [place], [address] Phone: [phone]   Fax:  [phone]  Physical Therapy Treatment  Patient Details  Name: Megan Atkinson MRN: [id number] Date of Birth: [birth year] Referring Provider: Claudette Laws  Encounter Date: 05/19/2016      PT End of Session - 05/19/16 1147    Visit Number 4   PT Start Time 1100   PT Stop Time 1147   PT Time Calculation (min) 47 min      Past Medical History:  Diagnosis Date  . CAD (coronary artery disease)    small NSTEMI 11/12: LHC 01/06/11:  LAD 90-95%, mD1 90%, pD2 50%,  pOM1 50%, RCA occluded,  EF > 65%;  CABG  12/12: L-LAD  . Diabetes mellitus   . Glucose intolerance (impaired glucose tolerance)    Hemoglobin A1c 6.4 in 12/2010  . HTN (hypertension)    Echo 01/06/11: EF 65%  . Hyperlipidemia   . Obesity   . Stroke [facility]) 03/06/2016    Past Surgical History:  Procedure Laterality Date  . ABDOMINAL HYSTERECTOMY    . APPENDECTOMY    . CORONARY ARTERY BYPASS GRAFT  01/12/2011   Procedure: CORONARY ARTERY BYPASS GRAFTING (CABG);  Surgeon: Alleen Borne, MD;  Location: [facility] OR;  Service: Open Heart Surgery;  Laterality: N/A;  coronary artery bypass graft times one using left internal mammary artery . Attempted endoscopic saphenous vein harvest  . LEFT HEART CATHETERIZATION WITH CORONARY ANGIOGRAM N/A 01/06/2011   Procedure: LEFT HEART CATHETERIZATION WITH CORONARY ANGIOGRAM;  Surgeon: Rollene Rotunda, MD;  Location: [facility] CATH LAB;  Service: Cardiovascular;  Laterality: N/A;    There were no vitals filed for this visit.      Subjective Assessment - 05/19/16 1101    Subjective "I have been doing all right"   Currently in Pain? No/denies   Pain Score 0-No pain                         [facility] Adult PT Treatment/Exercise - 05/19/16 0001      Ambulation/Gait   Ambulation/Gait Yes   Ambulation Distance (Feet) 100 Feet   Assistive device Rolling walker   Gait Pattern Shuffle   Ambulation Surface Level;Indoor   Gait Comments 3 stand rest breaks     Lumbar Exercises: Aerobic   UBE (Upper Arm Bike) L1 3.5 min standing     Knee/Hip Exercises: Standing   Other Standing Knee Exercises toe clears 6 in step wiyth RW 2x10    Other Standing Knee Exercises Standing march x10 with RW      Knee/Hip Exercises: Seated   Long Arc Quad Both;2 sets;10 reps   Long Arc Quad Weight 3 lbs.   Hamstring Curl 2 sets;15 reps;Both   Hamstring Limitations green Tband    Sit to Sand 5 reps;with UE support  legs pushed up against mat table                  PT Short Term Goals - 05/10/16 1501      PT SHORT TERM GOAL #1   Title pt will perform Berg balance test for balance screen   Time 2   Period Weeks   Status New           PT Long Term Goals - 05/10/16 1501      PT LONG TERM GOAL #1   Title Pt will improve walk to 500 feet  with LRAD   Time 8   Period Weeks   Status New     PT LONG TERM GOAL #2   Title Pt will perform sit to stand x 5 in 45seconds   Time 8   Period Weeks   Status New     PT LONG TERM GOAL #3   Title pt will report the ability to walk up the stairs at home carrying an object   Time 8   Period Weeks   Status New     PT LONG TERM GOAL #4   Title pt will report return to work readiness   Time 8   Period Weeks   Status New               Plan - 05/19/16 1149    Clinical Impression Statement Attempted more standing interventions this date to work on endurance. Pt did report some back pain with standing UBE. Pt does require cues for posture with standing Tband rows. Cues needed to slow down with the exercises and not try to finish quick. Pt continues to circumduct RLE when ambulating with RW.   Rehab Potential Good   PT Duration 8 weeks   PT Treatment/Interventions Electrical Stimulation;Stair training;Gait training;Therapeutic activities;Therapeutic  exercise;Neuromuscular re-education;Patient/family education;Passive range of motion   PT Next Visit Plan gait, endurance, strengthening of the LEs      Patient will benefit from skilled therapeutic intervention in order to improve the following deficits and impairments:  Decreased activity tolerance, Decreased strength, Difficulty walking, Decreased balance, Decreased endurance, Abnormal gait  Visit Diagnosis: Other abnormalities of gait and mobility  Decreased functional mobility and endurance  Muscle weakness (generalized)     Problem List Patient Active Problem List   Diagnosis Date Noted  . Acute blood loss anemia   . Elevated serum creatinine   . Slow transit constipation   . Benign essential HTN   . Generalized anxiety disorder   . Gait disturbance, post-stroke 03/13/2016  . Cerebellar stroke, acute (HCC) 03/10/2016  . Super obese (HCC)   . Prediabetes   . Ataxia, post-stroke   . Acute cystitis without hematuria   . CVA (cerebral vascular accident) (HCC) 03/06/2016  . Morbid obesity (HCC) 02/29/2016  . OSA (obstructive sleep apnea) 11/24/2014  . Dyspnea 10/02/2014  . Anemia 04/05/2011  . Urinary tract infection, site not specified 04/05/2011  . Diabetes mellitus (HCC) 02/22/2011  . Hyperlipidemia 02/22/2011  . CAD (coronary artery disease) 01/07/2011  . Hypertension 01/07/2011    Grayce Sessions, PTA 05/19/2016, 11:51 AM  [facility] [place] [facility] [address] [place], [place], [address] Phone: [phone]   Fax:  [phone]  Name: Megan Atkinson MRN: [id number] Date of Birth: 08/20/1950

## 2016-05-22 ENCOUNTER — Ambulatory Visit: Payer: Self-pay | Admitting: Nurse Practitioner

## 2016-05-23 ENCOUNTER — Telehealth: Payer: Self-pay | Admitting: *Deleted

## 2016-05-23 ENCOUNTER — Encounter: Payer: Self-pay | Admitting: Physical Therapy

## 2016-05-23 ENCOUNTER — Ambulatory Visit: Payer: Medicare Other | Admitting: Physical Therapy

## 2016-05-23 DIAGNOSIS — Z7409 Other reduced mobility: Secondary | ICD-10-CM

## 2016-05-23 DIAGNOSIS — R2689 Other abnormalities of gait and mobility: Secondary | ICD-10-CM

## 2016-05-23 DIAGNOSIS — I639 Cerebral infarction, unspecified: Secondary | ICD-10-CM

## 2016-05-23 DIAGNOSIS — M6281 Muscle weakness (generalized): Secondary | ICD-10-CM

## 2016-05-23 DIAGNOSIS — F411 Generalized anxiety disorder: Secondary | ICD-10-CM

## 2016-05-23 DIAGNOSIS — I6389 Other cerebral infarction: Secondary | ICD-10-CM

## 2016-05-23 NOTE — Therapy (Signed)
[facility] [address] [place], [place], [address] Phone: [phone]   Fax:  [phone]  Physical Therapy Treatment  Patient Details  Name: Megan Atkinson MRN: [id number] Date of Birth: [birth year] Referring Provider: Claudette Laws  Encounter Date: 05/23/2016      PT End of Session - 05/23/16 1200    Visit Number 5   Date for PT Re-Evaluation 07/05/16   PT Start Time 1100   PT Stop Time 1201   PT Time Calculation (min) 61 min   Activity Tolerance Patient limited by fatigue;Patient tolerated treatment well   Behavior During Therapy [facility] for tasks assessed/performed      Past Medical History:  Diagnosis Date  . CAD (coronary artery disease)    small NSTEMI 11/12: LHC 01/06/11:  LAD 90-95%, mD1 90%, pD2 50%,  pOM1 50%, RCA occluded,  EF > 65%;  CABG  12/12: L-LAD  . Diabetes mellitus   . Glucose intolerance (impaired glucose tolerance)    Hemoglobin A1c 6.4 in 12/2010  . HTN (hypertension)    Echo 01/06/11: EF 65%  . Hyperlipidemia   . Obesity   . Stroke [facility]) 03/06/2016    Past Surgical History:  Procedure Laterality Date  . ABDOMINAL HYSTERECTOMY    . APPENDECTOMY    . CORONARY ARTERY BYPASS GRAFT  01/12/2011   Procedure: CORONARY ARTERY BYPASS GRAFTING (CABG);  Surgeon: Alleen Borne, MD;  Location: [facility] OR;  Service: Open Heart Surgery;  Laterality: N/A;  coronary artery bypass graft times one using left internal mammary artery . Attempted endoscopic saphenous vein harvest  . LEFT HEART CATHETERIZATION WITH CORONARY ANGIOGRAM N/A 01/06/2011   Procedure: LEFT HEART CATHETERIZATION WITH CORONARY ANGIOGRAM;  Surgeon: Rollene Rotunda, MD;  Location: [facility] CATH LAB;  Service: Cardiovascular;  Laterality: N/A;    There were no vitals filed for this visit.      Subjective Assessment - 05/23/16 1114    Subjective Pt. states that she is doing alright. Doesn't have any pain. Just tired from getting ready this  morning.   Currently in Pain? No/denies   Pain Score 0-No pain                         [facility] Adult PT Treatment/Exercise - 05/23/16 0001      Ambulation/Gait   Ambulation Distance (Feet) 180 Feet   Assistive device Rolling walker;1 person hand held assist   Gait Pattern Right circumduction   Ambulation Surface Level;Outdoor     Lumbar Exercises: Aerobic   Stationary Bike Nustep lvl 2 4 min     Lumbar Exercises: Standing   Other Standing Lumbar Exercises Overhead shoulder press 2x10 with red ball   Other Standing Lumbar Exercises chest press with red ball 2x10, standing trunk rotation 2x10, red ball     Knee/Hip Exercises: Standing   Hip Abduction 1 set;10 reps;Both   Other Standing Knee Exercises Standing march x10 with RW   3 sets     Knee/Hip Exercises: Seated   Long Arc Quad Both;2 sets;10 reps   Long Arc Quad Weight 3 lbs.   Hamstring Curl 2 sets;15 reps;Both   Hamstring Limitations green Tband    Sit to Sand 10 reps;3 sets                  PT Short Term Goals - 05/10/16 1501      PT SHORT TERM GOAL #1   Title pt will  perform Berg balance test for balance screen   Time 2   Period Weeks   Status New           PT Long Term Goals - 05/10/16 1501      PT LONG TERM GOAL #1   Title Pt will improve walk to 500 feet with LRAD   Time 8   Period Weeks   Status New     PT LONG TERM GOAL #2   Title Pt will perform sit to stand x 5 in 45seconds   Time 8   Period Weeks   Status New     PT LONG TERM GOAL #3   Title pt will report the ability to walk up the stairs at home carrying an object   Time 8   Period Weeks   Status New     PT LONG TERM GOAL #4   Title pt will report return to work readiness   Time 8   Period Weeks   Status New               Plan - 05/23/16 1201    Clinical Impression Statement Pt. tolerated more standing exercises today to help increase endruance. Pt complained of slight pain in back when  doing standing exercises. Hand held assist with gait for 50 ft.  Without walker. Pt required VC for relaxed shoulders with walker and step through gait pattern. Pt. still circumducts RLE when ambulating   Rehab Potential Good   PT Duration 8 weeks   PT Treatment/Interventions Electrical Stimulation;Stair training;Gait training;Therapeutic activities;Therapeutic exercise;Neuromuscular re-education;Patient/family education;Passive range of motion   PT Next Visit Plan gait, endurance, strengthening of the LEs      Patient will benefit from skilled therapeutic intervention in order to improve the following deficits and impairments:  Decreased activity tolerance, Decreased strength, Difficulty walking, Decreased balance, Decreased endurance, Abnormal gait  Visit Diagnosis: Other abnormalities of gait and mobility  Decreased functional mobility and endurance  Muscle weakness (generalized)  Cerebellar stroke, acute (HCC)  Generalized anxiety disorder  Anxiety state  Cerebrovascular accident (CVA) due to other mechanism (HCC)  Cerebellar stroke [facility])     Problem List Patient Active Problem List   Diagnosis Date Noted  . Acute blood loss anemia   . Elevated serum creatinine   . Slow transit constipation   . Benign essential HTN   . Generalized anxiety disorder   . Gait disturbance, post-stroke 03/13/2016  . Cerebellar stroke, acute (HCC) 03/10/2016  . Super obese   . Prediabetes   . Ataxia, post-stroke   . Acute cystitis without hematuria   . CVA (cerebral vascular accident) (HCC) 03/06/2016  . Morbid obesity (HCC) 02/29/2016  . OSA (obstructive sleep apnea) 11/24/2014  . Dyspnea 10/02/2014  . Anemia 04/05/2011  . Urinary tract infection, site not specified 04/05/2011  . Diabetes mellitus (HCC) 02/22/2011  . Hyperlipidemia 02/22/2011  . CAD (coronary artery disease) 01/07/2011  . Hypertension 01/07/2011    Megan Atkinson 05/23/2016, 12:18 PM  [facility] [address] [place], [place], [address] Phone: [phone]   Fax:  [phone]  Name: Megan Atkinson MRN: [id number] Date of Birth: 1950/09/19

## 2016-05-23 NOTE — Telephone Encounter (Signed)
Spoke with patient and rescheduled her for hospital stroke FU. Advised she arrive 30 min early, bring insurance, medication list. She verbalized understanding.

## 2016-05-29 ENCOUNTER — Ambulatory Visit: Payer: Medicare Other | Admitting: Physical Therapy

## 2016-05-29 ENCOUNTER — Encounter: Payer: Self-pay | Admitting: Physical Therapy

## 2016-05-29 DIAGNOSIS — M6281 Muscle weakness (generalized): Secondary | ICD-10-CM

## 2016-05-29 DIAGNOSIS — R2689 Other abnormalities of gait and mobility: Secondary | ICD-10-CM

## 2016-05-29 DIAGNOSIS — I639 Cerebral infarction, unspecified: Secondary | ICD-10-CM

## 2016-05-29 DIAGNOSIS — F411 Generalized anxiety disorder: Secondary | ICD-10-CM

## 2016-05-29 DIAGNOSIS — I6389 Other cerebral infarction: Secondary | ICD-10-CM

## 2016-05-29 DIAGNOSIS — Z7409 Other reduced mobility: Secondary | ICD-10-CM

## 2016-05-29 NOTE — Therapy (Signed)
[facility] [place] [facility] [address] [place], [place], [address] Phone: [phone]   Fax:  [phone]  Physical Therapy Treatment  Patient Details  Name: Megan Atkinson MRN: [id number] Date of Birth: 11/24/1950 Referring Provider: Claudette Laws  Encounter Date: 05/29/2016      PT End of Session - 05/29/16 1528    Visit Number 6   Date for PT Re-Evaluation 07/05/16   PT Start Time 1433   PT Stop Time 1515   PT Time Calculation (min) 42 min   Activity Tolerance Patient limited by fatigue;Patient tolerated treatment well   Behavior During Therapy [facility] for tasks assessed/performed      Past Medical History:  Diagnosis Date  . CAD (coronary artery disease)    small NSTEMI 11/12: LHC 01/06/11:  LAD 90-95%, mD1 90%, pD2 50%,  pOM1 50%, RCA occluded,  EF > 65%;  CABG  12/12: L-LAD  . Diabetes mellitus   . Glucose intolerance (impaired glucose tolerance)    Hemoglobin A1c 6.4 in 12/2010  . HTN (hypertension)    Echo 01/06/11: EF 65%  . Hyperlipidemia   . Obesity   . Stroke [facility]) 03/06/2016    Past Surgical History:  Procedure Laterality Date  . ABDOMINAL HYSTERECTOMY    . APPENDECTOMY    . CORONARY ARTERY BYPASS GRAFT  01/12/2011   Procedure: CORONARY ARTERY BYPASS GRAFTING (CABG);  Surgeon: Alleen Borne, MD;  Location: [facility] OR;  Service: Open Heart Surgery;  Laterality: N/A;  coronary artery bypass graft times one using left internal mammary artery . Attempted endoscopic saphenous vein harvest  . LEFT HEART CATHETERIZATION WITH CORONARY ANGIOGRAM N/A 01/06/2011   Procedure: LEFT HEART CATHETERIZATION WITH CORONARY ANGIOGRAM;  Surgeon: Rollene Rotunda, MD;  Location: [facility] CATH LAB;  Service: Cardiovascular;  Laterality: N/A;    There were no vitals filed for this visit.      Subjective Assessment - 05/29/16 1436    Subjective Pt stated that she is in no pain. "It's a workout trying to get ready in the morning." No  problems since last visit.   Pertinent History HTN   Limitations Walking;House hold activities   How long can you walk comfortably? 3 minutes   Patient Stated Goals Improve stair mobility carrying things, getting in and out of the car, walking with a SPC or nothing,    Currently in Pain? No/denies   Pain Score 0-No pain                         [facility] Adult PT Treatment/Exercise - 05/29/16 0001      Ambulation/Gait   Ambulation Distance (Feet) 50 Feet   Assistive device Straight cane   Ambulation Surface Level;Indoor   Gait Comments 1 standing rest break      Lumbar Exercises: Aerobic   UBE (Upper Arm Bike) Lv 2 6 min  1 rest break after 4 mins     Lumbar Exercises: Standing   Other Standing Lumbar Exercises ball toss 2x30 sec  with walker   Other Standing Lumbar Exercises chest press with red ball 2x10, standing trunk rotation 2x10, red ball  with walker     Knee/Hip Exercises: Standing   Other Standing Knee Exercises toe clears  6 inch with walker 10 reps                  PT Short Term Goals - 05/10/16 1501      PT SHORT  TERM GOAL #1   Title pt will perform Berg balance test for balance screen   Time 2   Period Weeks   Status New           PT Long Term Goals - 05/29/16 1538      PT LONG TERM GOAL #1   Time 8   Period Weeks   Status New     PT LONG TERM GOAL #2   Title Pt will perform sit to stand x 5 in 45seconds     PT LONG TERM GOAL #3   Title pt will report the ability to walk up the stairs at home carrying an object   Period Weeks   Status New               Plan - 05/29/16 1529    Clinical Impression Statement Focused on standing endurance today. Rest break needed during warm up after 4 mins of standing. Pt tolerated all exercises well. VC needed to slow down during chest press and to maintain focus during treatment. Multiple standing rest breaks between exercises.LOBx2 during standing ball toss. Pt asked to walk  with cane today. Pt ambulated with cane short distance with CGA and chair follow. Pt shuffled gait with step to pattern. VC to bring SPC closer in due to shortened step length on both sides. Pt fatigued quickly while ambulating with SPC   Rehab Potential Good   PT Duration 8 weeks   PT Treatment/Interventions Electrical Stimulation;Stair training;Gait training;Therapeutic activities;Therapeutic exercise;Neuromuscular re-education;Patient/family education;Passive range of motion   PT Next Visit Plan gait, endurance, strengthening of the LEs   Consulted and Agree with Plan of Care Patient      Patient will benefit from skilled therapeutic intervention in order to improve the following deficits and impairments:  Decreased activity tolerance, Decreased strength, Difficulty walking, Decreased balance, Decreased endurance, Abnormal gait  Visit Diagnosis: Other abnormalities of gait and mobility  Decreased functional mobility and endurance  Muscle weakness (generalized)  Cerebellar stroke, acute (HCC)  Generalized anxiety disorder  Anxiety state  Cerebrovascular accident (CVA) due to other mechanism (HCC)  Cerebellar stroke [facility])     Problem List Patient Active Problem List   Diagnosis Date Noted  . Acute blood loss anemia   . Elevated serum creatinine   . Slow transit constipation   . Benign essential HTN   . Generalized anxiety disorder   . Gait disturbance, post-stroke 03/13/2016  . Cerebellar stroke, acute (HCC) 03/10/2016  . Super obese   . Prediabetes   . Ataxia, post-stroke   . Acute cystitis without hematuria   . CVA (cerebral vascular accident) (HCC) 03/06/2016  . Morbid obesity (HCC) 02/29/2016  . OSA (obstructive sleep apnea) 11/24/2014  . Dyspnea 10/02/2014  . Anemia 04/05/2011  . Urinary tract infection, site not specified 04/05/2011  . Diabetes mellitus (HCC) 02/22/2011  . Hyperlipidemia 02/22/2011  . CAD (coronary artery disease) 01/07/2011  .  Hypertension 01/07/2011    [name] 05/29/2016, 3:39 PM  [facility] [place] [facility] [address] [place], [place], [address] Phone: [phone]   Fax:  [phone]  Name: Christne Platts MRN: [id number] Date of Birth: December 29, 1950

## 2016-05-31 ENCOUNTER — Ambulatory Visit: Payer: Medicare Other | Admitting: Physical Therapy

## 2016-05-31 ENCOUNTER — Encounter: Payer: Self-pay | Admitting: Physical Therapy

## 2016-05-31 DIAGNOSIS — R2689 Other abnormalities of gait and mobility: Secondary | ICD-10-CM | POA: Diagnosis not present

## 2016-05-31 DIAGNOSIS — I6389 Other cerebral infarction: Secondary | ICD-10-CM

## 2016-05-31 DIAGNOSIS — I639 Cerebral infarction, unspecified: Secondary | ICD-10-CM

## 2016-05-31 DIAGNOSIS — F411 Generalized anxiety disorder: Secondary | ICD-10-CM

## 2016-05-31 DIAGNOSIS — Z7409 Other reduced mobility: Secondary | ICD-10-CM

## 2016-05-31 DIAGNOSIS — M6281 Muscle weakness (generalized): Secondary | ICD-10-CM

## 2016-05-31 NOTE — Therapy (Signed)
[facility] [address] [place], [place], [address] Phone: [phone]   Fax:  [phone]  Physical Therapy Treatment  Patient Details  Name: Megan Atkinson MRN: [id number] Date of Birth: Apr 19, 1950 Referring Provider: Claudette Laws  Encounter Date: 05/31/2016      PT End of Session - 05/31/16 1532    Activity Tolerance Patient limited by fatigue;Patient tolerated treatment well   Behavior During Therapy [facility] for tasks assessed/performed      Past Medical History:  Diagnosis Date  . CAD (coronary artery disease)    small NSTEMI 11/12: LHC 01/06/11:  LAD 90-95%, mD1 90%, pD2 50%,  pOM1 50%, RCA occluded,  EF > 65%;  CABG  12/12: L-LAD  . Diabetes mellitus   . Glucose intolerance (impaired glucose tolerance)    Hemoglobin A1c 6.4 in 12/2010  . HTN (hypertension)    Echo 01/06/11: EF 65%  . Hyperlipidemia   . Obesity   . Stroke [facility]) 03/06/2016    Past Surgical History:  Procedure Laterality Date  . ABDOMINAL HYSTERECTOMY    . APPENDECTOMY    . CORONARY ARTERY BYPASS GRAFT  01/12/2011   Procedure: CORONARY ARTERY BYPASS GRAFTING (CABG);  Surgeon: Alleen Borne, MD;  [facility] OR;  Service: Open Heart Surgery;  Laterality: N/A;  coronary artery bypass graft times one using left internal mammary artery . Attempted endoscopic saphenous vein harvest  . LEFT HEART CATHETERIZATION WITH CORONARY ANGIOGRAM N/A 01/06/2011   Procedure: LEFT HEART CATHETERIZATION WITH CORONARY ANGIOGRAM;  Surgeon: Rollene Rotunda, MD;  Location: [facility] CATH LAB;  Service: Cardiovascular;  Laterality: N/A;    There were no vitals filed for this visit.      Subjective Assessment - 05/31/16 1435    Subjective Pt states that she has no pain today. Just tired from getting ready this morning. No problems since last visit.   Pertinent History HTN   Limitations Walking;House hold activities   How long can you walk comfortably? 3 minutes    Patient Stated Goals Improve stair mobility carrying things, getting in and out of the car, walking with a SPC or nothing,    Currently in Pain? No/denies   Pain Score 0-No pain                         [facility] Adult PT Treatment/Exercise - 05/31/16 0001      Ambulation/Gait   Ambulation Distance (Feet) 30 Feet   Assistive device Straight cane   Ambulation Surface Level;Indoor     High Level Balance   High Level Balance Activities --  DLS reaching across body [id number] [id number]     Lumbar Exercises: Aerobic   Stationary Bike Nustep lvl 2 5 min   UBE (Upper Arm Bike) Lv 2 6 min     Lumbar Exercises: Standing   Row 15 reps  2 sets   Theraband Level (Row) Level 3 (Green)   Other Standing Lumbar Exercises Stanidng Trunk rotation 2x10   with red ball     Knee/Hip Exercises: Standing   Other Standing Knee Exercises standing marches 2x10      Knee/Hip Exercises: Seated   Long Arc Quad 3 sets;10 reps;Weights   Long Arc Quad Weight 3 lbs.   Hamstring Curl 2 sets;15 reps;Both   Hamstring Limitations green Tband                   PT Short Term Goals - 05/10/16  1501      PT SHORT TERM GOAL #1   Title pt will perform Berg balance test for balance screen   Time 2   Period Weeks   Status New           PT Long Term Goals - 05/31/16 1541      PT LONG TERM GOAL #1   Status On-going     PT LONG TERM GOAL #2   Status On-going               Plan - 05/31/16 1532    Clinical Impression Statement Continued with standing endurance. Warm up on UBE. Rest break after 4 mins of standing. 1 rest break on nustep after 3 mins. Pt completed all exercises today but fatique easily. During DLS reaching pt presented with stiffness, decreased hip flexion and knee flexion when asked to reach down/across body. Pt has limited motion with left shoulder when reaching up. Pt needs cueing to rotate trunk when reaching across body.  Decreased distance with gait with SPC/  CGA with wheelchair follow. Still has shuffled gait with step to pattern. Pt reported that she was very tired because she went up/down her stairs twice today.   Rehab Potential Good   PT Duration 8 weeks   PT Treatment/Interventions Electrical Stimulation;Stair training;Gait training;Therapeutic activities;Therapeutic exercise;Neuromuscular re-education;Patient/family education;Passive range of motion   PT Next Visit Plan gait, endurance, strengthening of the LEs   Consulted and Agree with Plan of Care Patient      Patient will benefit from skilled therapeutic intervention in order to improve the following deficits and impairments:  Decreased activity tolerance, Decreased strength, Difficulty walking, Decreased balance, Decreased endurance, Abnormal gait  Visit Diagnosis: Other abnormalities of gait and mobility  Decreased functional mobility and endurance  Muscle weakness (generalized)  Cerebellar stroke, acute (HCC)  Generalized anxiety disorder  Anxiety state  Cerebrovascular accident (CVA) due to other mechanism (HCC)  Cerebellar stroke [facility])     Problem List Patient Active Problem List   Diagnosis Date Noted  . Acute blood loss anemia   . Elevated serum creatinine   . Slow transit constipation   . Benign essential HTN   . Generalized anxiety disorder   . Gait disturbance, post-stroke 03/13/2016  . Cerebellar stroke, acute (HCC) 03/10/2016  . Super obese   . Prediabetes   . Ataxia, post-stroke   . Acute cystitis without hematuria   . CVA (cerebral vascular accident) (HCC) 03/06/2016  . Morbid obesity (HCC) 02/29/2016  . OSA (obstructive sleep apnea) 11/24/2014  . Dyspnea 10/02/2014  . Anemia 04/05/2011  . Urinary tract infection, site not specified 04/05/2011  . Diabetes mellitus (HCC) 02/22/2011  . Hyperlipidemia 02/22/2011  . CAD (coronary artery disease) 01/07/2011  . Hypertension 01/07/2011    [name] 05/31/2016, 3:42 PM  [facility] [place] [facility] [address] [place], [place], [address] Phone: [phone]   Fax:  [phone]  Name: Jamelyn Bovard MRN: [id number] Date of Birth: [birth year]

## 2016-06-02 ENCOUNTER — Ambulatory Visit: Payer: Medicare Other | Admitting: Physical Therapy

## 2016-06-02 ENCOUNTER — Encounter: Payer: Self-pay | Admitting: Physical Therapy

## 2016-06-02 DIAGNOSIS — R2689 Other abnormalities of gait and mobility: Secondary | ICD-10-CM

## 2016-06-02 DIAGNOSIS — I639 Cerebral infarction, unspecified: Secondary | ICD-10-CM

## 2016-06-02 DIAGNOSIS — M6281 Muscle weakness (generalized): Secondary | ICD-10-CM

## 2016-06-02 DIAGNOSIS — Z7409 Other reduced mobility: Secondary | ICD-10-CM

## 2016-06-02 NOTE — Therapy (Signed)
[facility] [place] [facility] [address] [place], [place], [address] Phone: [phone]   Fax:  [phone]  Physical Therapy Treatment  Patient Details  Name: Tequia Wolman MRN: [id number] Date of Birth: [birth year] Referring Provider: Claudette Laws  Encounter Date: 06/02/2016      PT End of Session - 06/02/16 1139    Visit Number 8   Date for PT Re-Evaluation 07/05/16   PT Start Time 1050   PT Stop Time 1135   PT Time Calculation (min) 45 min   Activity Tolerance Patient limited by fatigue;Patient tolerated treatment well   Behavior During Therapy [facility] for tasks assessed/performed      Past Medical History:  Diagnosis Date  . CAD (coronary artery disease)    small NSTEMI 11/12: LHC 01/06/11:  LAD 90-95%, mD1 90%, pD2 50%,  pOM1 50%, RCA occluded,  EF > 65%;  CABG  12/12: L-LAD  . Diabetes mellitus   . Glucose intolerance (impaired glucose tolerance)    Hemoglobin A1c 6.4 in 12/2010  . HTN (hypertension)    Echo 01/06/11: EF 65%  . Hyperlipidemia   . Obesity   . Stroke [facility] 03/06/2016    Past Surgical History:  Procedure Laterality Date  . ABDOMINAL HYSTERECTOMY    . APPENDECTOMY    . CORONARY ARTERY BYPASS GRAFT  01/12/2011   Procedure: CORONARY ARTERY BYPASS GRAFTING (CABG);  Surgeon: Alleen Borne, MD;  Location: [facility] OR;  Service: Open Heart Surgery;  Laterality: N/A;  coronary artery bypass graft times one using left internal mammary artery . Attempted endoscopic saphenous vein harvest  . LEFT HEART CATHETERIZATION WITH CORONARY ANGIOGRAM N/A 01/06/2011   Procedure: LEFT HEART CATHETERIZATION WITH CORONARY ANGIOGRAM;  Surgeon: Rollene Rotunda, MD;  Location: [facility] CATH LAB;  Service: Cardiovascular;  Laterality: N/A;    There were no vitals filed for this visit.      Subjective Assessment - 06/02/16 1107    Subjective pt verb being tired when PTA meet pt at car to walk into PT session   Currently in Pain?  No/denies                         [facility] Adult PT Treatment/Exercise - 06/02/16 0001      Ambulation/Gait   Gait Comments amb in from car with SPC and HHA 200 feet min A working on step through gait and encouragement to not rest bu 3 standing rest breaks needed     Standardized Balance Assessment   Standardized Balance Assessment Berg Balance Test     Berg Balance Test   Sit to Stand Able to stand  independently using hands   Standing Unsupported Able to stand safely 2 minutes   Sitting with Back Unsupported but Feet Supported on Floor or Stool Able to sit safely and securely 2 minutes   Stand to Sit Sits safely with minimal use of hands   Transfers Able to transfer safely, definite need of hands   Standing Unsupported with Eyes Closed Able to stand 10 seconds with supervision   Standing Ubsupported with Feet Together Able to place feet together independently but unable to hold for 30 seconds   From Standing, Reach Forward with Outstretched Arm Can reach forward >12 cm safely (5")   From Standing Position, Pick up Object from Floor Unable to pick up shoe, but reaches 2-5 cm (1-2") from shoe and balances independently   From Standing Position, Turn to Look Behind  Over each Shoulder Turn sideways only but maintains balance   Turn 360 Degrees Needs close supervision or verbal cueing   Standing Unsupported, Alternately Place Feet on Step/Stool Needs assistance to keep from falling or unable to try   Standing Unsupported, One Foot in [employer] balance while stepping or standing   Standing on One Leg Unable to try or needs assist to prevent fall   Total Score 31     Lumbar Exercises: Aerobic   UBE (Upper Arm Bike) L 3 2 fwd/2 back     Lumbar Exercises: Standing   Row Both;15 reps  2 sets   Theraband Level (Row) Level 3 (Green)   Shoulder Extension Both;15 reps;Theraband  2 sets   Theraband Level (Shoulder Extension) Level 3 (Green)   Other Standing Lumbar Exercises  Standing ball toss 2 x 10     Knee/Hip Exercises: Standing   Other Standing Knee Exercises toe clears  6 inch 3 x 10 reps     Knee/Hip Exercises: Seated   Hamstring Curl 2 sets;15 reps;Both   Hamstring Limitations green Tband                   PT Short Term Goals - 06/02/16 1113      PT SHORT TERM GOAL #1   Title pt will perform Berg balance test for balance screen   Baseline 31/56 today   Status On-going           PT Long Term Goals - 06/02/16 1110      PT LONG TERM GOAL #1   Title Pt will improve walk to 500 feet with LRAD   Status On-going     PT LONG TERM GOAL #2   Title Pt will perform sit to stand x 5 in 45seconds   Status On-going     PT LONG TERM GOAL #3   Title pt will report the ability to walk up the stairs at home carrying an object   Status On-going     PT LONG TERM GOAL #4   Title pt will report return to work readiness   Status On-going               Plan - 06/02/16 1139    Clinical Impression Statement The patient walked all the way in on her SPC with 3 standing breaks. She was able to complete sitting UBE of 4 mins without a break. The patient was able to complete standing ball toss however would not catch the ball if it was thrown to the side there for not a guarding . During the first set of toe touches on 6in box she used her cane and HHA, the second and third set she only used HHA, she is very hesitate without the cane and took awhile to complete the sets.     PT Next Visit Plan gait, endurance, strengthening of the LEs      Patient will benefit from skilled therapeutic intervention in order to improve the following deficits and impairments:  Decreased activity tolerance, Decreased strength, Difficulty walking, Decreased balance, Decreased endurance, Abnormal gait  Visit Diagnosis: Other abnormalities of gait and mobility  Decreased functional mobility and endurance  Muscle weakness (generalized)  Cerebellar stroke,  acute [facility])     Problem List Patient Active Problem List   Diagnosis Date Noted  . Acute blood loss anemia   . Elevated serum creatinine   . Slow transit constipation   . Benign essential HTN   .  Generalized anxiety disorder   . Gait disturbance, post-stroke 03/13/2016  . Cerebellar stroke, acute (HCC) 03/10/2016  . Super obese   . Prediabetes   . Ataxia, post-stroke   . Acute cystitis without hematuria   . CVA (cerebral vascular accident) (HCC) 03/06/2016  . Morbid obesity (HCC) 02/29/2016  . OSA (obstructive sleep apnea) 11/24/2014  . Dyspnea 10/02/2014  . Anemia 04/05/2011  . Urinary tract infection, site not specified 04/05/2011  . Diabetes mellitus (HCC) 02/22/2011  . Hyperlipidemia 02/22/2011  . CAD (coronary artery disease) 01/07/2011  . Hypertension 01/07/2011    Raquel James SPTA 06/02/2016, 11:46 AM  [facility] [place] [address] [place], [place], [address] Phone: [phone]   Fax:  [phone]  Name: Laurena Valko MRN: [id number] Date of Birth: February 05, 1951

## 2016-06-05 ENCOUNTER — Ambulatory Visit: Payer: Medicare Other | Admitting: Physical Therapy

## 2016-06-05 ENCOUNTER — Encounter: Payer: Self-pay | Admitting: Physical Therapy

## 2016-06-05 DIAGNOSIS — R2689 Other abnormalities of gait and mobility: Secondary | ICD-10-CM | POA: Diagnosis not present

## 2016-06-05 DIAGNOSIS — I6389 Other cerebral infarction: Secondary | ICD-10-CM

## 2016-06-05 DIAGNOSIS — Z7409 Other reduced mobility: Secondary | ICD-10-CM

## 2016-06-05 DIAGNOSIS — M6281 Muscle weakness (generalized): Secondary | ICD-10-CM

## 2016-06-05 DIAGNOSIS — I639 Cerebral infarction, unspecified: Secondary | ICD-10-CM

## 2016-06-05 DIAGNOSIS — F411 Generalized anxiety disorder: Secondary | ICD-10-CM

## 2016-06-05 NOTE — Therapy (Signed)
[facility] [place] [facility] [address] [place], [place], [address] Phone: [phone]   Fax:  [phone]  Physical Therapy Treatment  Patient Details  Name: Megan Atkinson MRN: [id number] Date of Birth: 01-16-1951 Referring Provider: Claudette Laws  Encounter Date: 06/05/2016      PT End of Session - 06/05/16 1233    Visit Number 9   Date for PT Re-Evaluation 07/05/16   PT Start Time 1145   PT Stop Time 1233   PT Time Calculation (min) 48 min   Activity Tolerance Patient limited by fatigue;Patient tolerated treatment well   Behavior During Therapy [facility] for tasks assessed/performed      Past Medical History:  Diagnosis Date  . CAD (coronary artery disease)    small NSTEMI 11/12: LHC 01/06/11:  LAD 90-95%, mD1 90%, pD2 50%,  pOM1 50%, RCA occluded,  EF > 65%;  CABG  12/12: L-LAD  . Diabetes mellitus   . Glucose intolerance (impaired glucose tolerance)    Hemoglobin A1c 6.4 in 12/2010  . HTN (hypertension)    Echo 01/06/11: EF 65%  . Hyperlipidemia   . Obesity   . Stroke [facility]) 03/06/2016    Past Surgical History:  Procedure Laterality Date  . ABDOMINAL HYSTERECTOMY    . APPENDECTOMY    . CORONARY ARTERY BYPASS GRAFT  01/12/2011   Procedure: CORONARY ARTERY BYPASS GRAFTING (CABG);  Surgeon: Alleen Borne, MD;  Location: [facility] OR;  Service: Open Heart Surgery;  Laterality: N/A;  coronary artery bypass graft times one using left internal mammary artery . Attempted endoscopic saphenous vein harvest  . LEFT HEART CATHETERIZATION WITH CORONARY ANGIOGRAM N/A 01/06/2011   Procedure: LEFT HEART CATHETERIZATION WITH CORONARY ANGIOGRAM;  Surgeon: Rollene Rotunda, MD;  Location: [facility] CATH LAB;  Service: Cardiovascular;  Laterality: N/A;    There were no vitals filed for this visit.      Subjective Assessment - 06/05/16 1146    Subjective Pt reported no pain. Just soreness in muscles from last week. Reports that she needed to get  some ibuprofen for her soreness because she ran out.    Currently in Pain? No/denies   Pain Score 0-No pain                         [facility] Adult PT Treatment/Exercise - 06/05/16 0001      Ambulation/Gait   Assistive device Rolling walker   Ambulation Surface Level;Indoor   Gait Comments amb to car with walker  >180 ft, standing rest break x3     Lumbar Exercises: Aerobic   UBE (Upper Arm Bike) L2 fwd 3 back 3   rest after 4 min      Lumbar Exercises: Standing   Other Standing Lumbar Exercises reaching chest press/overhead press 2x10, yellowball 2nd set without yellowball, standing trunk rotation 2x10, yellow ball        Knee/Hip Exercises: Standing   Other Standing Knee Exercises toe clears  8 inch 3 x 10 reps     Knee/Hip Exercises: Seated   Sit to Sand 2 sets;5 reps                  PT Short Term Goals - 06/02/16 1113      PT SHORT TERM GOAL #1   Title pt will perform Berg balance test for balance screen   Baseline 31/56 today   Status On-going  PT Long Term Goals - 06/05/16 1238      PT LONG TERM GOAL #2   Status On-going               Plan - 06/05/16 1233    Clinical Impression Statement Pt completed all exercises today. Required multiple rest breaks during and inbetween exercises. Pt fatigued easily during all exercises.Pt was moving slower than normal during all exercises. Also not as talkative today. Pt had difficulty with reaching chest press due to lack of trunk flexion. VC needed to flex trunk when reaching. Toe touchs with 8 in box pt wouldn't even attempt so switched back to 6 inch box. HHAx1, pt not as hesitant to complete sets today. Ambulated back to vehicle with walker. Pt had increased step length and better step through gait pattern with walker.    Rehab Potential Good   PT Duration 8 weeks   PT Treatment/Interventions Electrical Stimulation;Stair training;Gait training;Therapeutic activities;Therapeutic  exercise;Neuromuscular re-education;Patient/family education;Passive range of motion   PT Next Visit Plan gait, endurance, strengthening of the LEs   Consulted and Agree with Plan of Care Patient      Patient will benefit from skilled therapeutic intervention in order to improve the following deficits and impairments:  Decreased activity tolerance, Decreased strength, Difficulty walking, Decreased balance, Decreased endurance, Abnormal gait  Visit Diagnosis: Other abnormalities of gait and mobility  Decreased functional mobility and endurance  Muscle weakness (generalized)  Cerebellar stroke, acute (HCC)  Generalized anxiety disorder  Anxiety state  Cerebrovascular accident (CVA) due to other mechanism (HCC)  Cerebellar stroke [facility])     Problem List Patient Active Problem List   Diagnosis Date Noted  . Acute blood loss anemia   . Elevated serum creatinine   . Slow transit constipation   . Benign essential HTN   . Generalized anxiety disorder   . Gait disturbance, post-stroke 03/13/2016  . Cerebellar stroke, acute (HCC) 03/10/2016  . Super obese   . Prediabetes   . Ataxia, post-stroke   . Acute cystitis without hematuria   . CVA (cerebral vascular accident) (HCC) 03/06/2016  . Morbid obesity (HCC) 02/29/2016  . OSA (obstructive sleep apnea) 11/24/2014  . Dyspnea 10/02/2014  . Anemia 04/05/2011  . Urinary tract infection, site not specified 04/05/2011  . Diabetes mellitus (HCC) 02/22/2011  . Hyperlipidemia 02/22/2011  . CAD (coronary artery disease) 01/07/2011  . Hypertension 01/07/2011    [name] 06/05/2016, 12:40 PM  [facility] [address] [place], [place], [address] Phone: [phone]   Fax:  [phone]  Name: Murna Backer MRN: [id number] Date of Birth: 21-Dec-1950

## 2016-06-07 ENCOUNTER — Ambulatory Visit: Payer: Medicare Other | Attending: Physical Medicine & Rehabilitation | Admitting: Physical Therapy

## 2016-06-07 ENCOUNTER — Encounter: Payer: Self-pay | Admitting: Physical Therapy

## 2016-06-07 DIAGNOSIS — I639 Cerebral infarction, unspecified: Secondary | ICD-10-CM | POA: Diagnosis not present

## 2016-06-07 DIAGNOSIS — Z7409 Other reduced mobility: Secondary | ICD-10-CM

## 2016-06-07 DIAGNOSIS — F411 Generalized anxiety disorder: Secondary | ICD-10-CM | POA: Diagnosis present

## 2016-06-07 DIAGNOSIS — M6281 Muscle weakness (generalized): Secondary | ICD-10-CM | POA: Insufficient documentation

## 2016-06-07 DIAGNOSIS — R2689 Other abnormalities of gait and mobility: Secondary | ICD-10-CM | POA: Diagnosis present

## 2016-06-07 DIAGNOSIS — I638 Other cerebral infarction: Secondary | ICD-10-CM | POA: Insufficient documentation

## 2016-06-07 DIAGNOSIS — I10 Essential (primary) hypertension: Secondary | ICD-10-CM | POA: Diagnosis not present

## 2016-06-07 DIAGNOSIS — I259 Chronic ischemic heart disease, unspecified: Secondary | ICD-10-CM | POA: Diagnosis not present

## 2016-06-07 DIAGNOSIS — I6389 Other cerebral infarction: Secondary | ICD-10-CM

## 2016-06-07 DIAGNOSIS — G4733 Obstructive sleep apnea (adult) (pediatric): Secondary | ICD-10-CM | POA: Diagnosis not present

## 2016-06-07 NOTE — Therapy (Signed)
[facility] [place] [facility] [address] [place], [place], [address] Phone: [phone]   Fax:  [phone]  Physical Therapy Treatment  Patient Details  Name: Megan Atkinson MRN: [id number] Date of Birth: 1950-11-27 Referring Provider: Claudette Laws  Encounter Date: 06/07/2016      PT End of Session - 06/07/16 1624    Visit Number 10   Date for PT Re-Evaluation 07/05/16   PT Start Time 1430   PT Stop Time 1513   PT Time Calculation (min) 43 min   Activity Tolerance Patient limited by fatigue;Patient tolerated treatment well   Behavior During Therapy [facility] for tasks assessed/performed      Past Medical History:  Diagnosis Date  . CAD (coronary artery disease)    small NSTEMI 11/12: LHC 01/06/11:  LAD 90-95%, mD1 90%, pD2 50%,  pOM1 50%, RCA occluded,  EF > 65%;  CABG  12/12: L-LAD  . Diabetes mellitus   . Glucose intolerance (impaired glucose tolerance)    Hemoglobin A1c 6.4 in 12/2010  . HTN (hypertension)    Echo 01/06/11: EF 65%  . Hyperlipidemia   . Obesity   . Stroke [facility]) 03/06/2016    Past Surgical History:  Procedure Laterality Date  . ABDOMINAL HYSTERECTOMY    . APPENDECTOMY    . CORONARY ARTERY BYPASS GRAFT  01/12/2011   Procedure: CORONARY ARTERY BYPASS GRAFTING (CABG);  Surgeon: Alleen Borne, MD;  Location: [facility] OR;  Service: Open Heart Surgery;  Laterality: N/A;  coronary artery bypass graft times one using left internal mammary artery . Attempted endoscopic saphenous vein harvest  . LEFT HEART CATHETERIZATION WITH CORONARY ANGIOGRAM N/A 01/06/2011   Procedure: LEFT HEART CATHETERIZATION WITH CORONARY ANGIOGRAM;  Surgeon: Rollene Rotunda, MD;  Location: [facility] CATH LAB;  Service: Cardiovascular;  Laterality: N/A;    There were no vitals filed for this visit.      Subjective Assessment - 06/07/16 1436    Subjective Pt reports no pain today said she took some alleve before she came in. States that her quads  have been aching at night.    Currently in Pain? No/denies                         [facility] Adult PT Treatment/Exercise - 06/07/16 0001      Ambulation/Gait   Ambulation Distance (Feet) --  180 ft   Assistive device Rolling walker   Ambulation Surface Indoor;Level   Gait Comments amb from hallway to clinic     High Level Balance   High Level Balance Activities Negotitating around obstacles  with rolling walker     Lumbar Exercises: Aerobic   UBE (Upper Arm Bike) L2 fwd 3/back 3     Lumbar Exercises: Standing   Row Both;15 reps   Theraband Level (Row) Level 3 (Green)   Other Standing Lumbar Exercises ball toss 3x30 sec  with green ball   Other Standing Lumbar Exercises standing cross body chops  2x10, with red ball     Knee/Hip Exercises: Seated   Long Arc Quad 3 sets;15 reps   Long Arc Quad Weight 3 lbs.   Hamstring Curl 3 sets;15 reps   Hamstring Limitations green theraband                  PT Short Term Goals - 06/02/16 1113      PT SHORT TERM GOAL #1   Title pt will perform Berg balance test for  balance screen   Baseline 31/56 today   Status On-going           PT Long Term Goals - 06/07/16 1633      PT LONG TERM GOAL #1   Status On-going     PT LONG TERM GOAL #2   Status On-going               Plan - 06/07/16 1624    Clinical Impression Statement Pt was in hallway waiting from where she had walked in by herself from outside. Pt ambulated from hallway to clinic with 1 rest break. Pt was more energetic today and tolerated all exercises well. Continued to work on increasing pt standing endurance. Pt only needed CGA with standing exercises. Pt was more compliant with standing ball toss. Still has difficulty catching ball on left side Required longer rest breaks between each set but completed exercise with no reports of increased pain.    Rehab Potential Good   PT Duration 8 weeks   PT Treatment/Interventions Electrical  Stimulation;Stair training;Gait training;Therapeutic activities;Therapeutic exercise;Neuromuscular re-education;Patient/family education;Passive range of motion   PT Next Visit Plan gait, endurance, strengthening of the LEs      Patient will benefit from skilled therapeutic intervention in order to improve the following deficits and impairments:  Decreased activity tolerance, Decreased strength, Difficulty walking, Decreased balance, Decreased endurance, Abnormal gait  Visit Diagnosis: Other abnormalities of gait and mobility  Decreased functional mobility and endurance  Muscle weakness (generalized)  Cerebellar stroke, acute (HCC)  Generalized anxiety disorder  Anxiety state  Cerebrovascular accident (CVA) due to other mechanism (HCC)  Cerebellar stroke [facility])     Problem List Patient Active Problem List   Diagnosis Date Noted  . Acute blood loss anemia   . Elevated serum creatinine   . Slow transit constipation   . Benign essential HTN   . Generalized anxiety disorder   . Gait disturbance, post-stroke 03/13/2016  . Cerebellar stroke, acute (HCC) 03/10/2016  . Super obese   . Prediabetes   . Ataxia, post-stroke   . Acute cystitis without hematuria   . CVA (cerebral vascular accident) (HCC) 03/06/2016  . Morbid obesity (HCC) 02/29/2016  . OSA (obstructive sleep apnea) 11/24/2014  . Dyspnea 10/02/2014  . Anemia 04/05/2011  . Urinary tract infection, site not specified 04/05/2011  . Diabetes mellitus (HCC) 02/22/2011  . Hyperlipidemia 02/22/2011  . CAD (coronary artery disease) 01/07/2011  . Hypertension 01/07/2011    Grayce Sessions, PTA 06/07/2016, 4:37 PM  [facility] [place] [address] [place], [place], [address] Phone: [phone]   Fax:  [phone]  Name: Megan Atkinson MRN: [id number] Date of Birth: 07/14/1950

## 2016-06-08 ENCOUNTER — Ambulatory Visit (HOSPITAL_BASED_OUTPATIENT_CLINIC_OR_DEPARTMENT_OTHER): Payer: Medicare Other | Admitting: Physical Medicine & Rehabilitation

## 2016-06-08 ENCOUNTER — Encounter: Payer: Self-pay | Admitting: Physical Medicine & Rehabilitation

## 2016-06-08 ENCOUNTER — Encounter: Payer: Medicare Other | Attending: Physical Medicine & Rehabilitation

## 2016-06-08 VITALS — BP 158/85 | HR 78

## 2016-06-08 DIAGNOSIS — I69398 Other sequelae of cerebral infarction: Secondary | ICD-10-CM | POA: Diagnosis not present

## 2016-06-08 DIAGNOSIS — R269 Unspecified abnormalities of gait and mobility: Secondary | ICD-10-CM | POA: Diagnosis not present

## 2016-06-08 DIAGNOSIS — I10 Essential (primary) hypertension: Secondary | ICD-10-CM | POA: Insufficient documentation

## 2016-06-08 DIAGNOSIS — Z951 Presence of aortocoronary bypass graft: Secondary | ICD-10-CM | POA: Insufficient documentation

## 2016-06-08 DIAGNOSIS — Z8249 Family history of ischemic heart disease and other diseases of the circulatory system: Secondary | ICD-10-CM | POA: Diagnosis not present

## 2016-06-08 DIAGNOSIS — I2510 Atherosclerotic heart disease of native coronary artery without angina pectoris: Secondary | ICD-10-CM | POA: Insufficient documentation

## 2016-06-08 DIAGNOSIS — E119 Type 2 diabetes mellitus without complications: Secondary | ICD-10-CM | POA: Insufficient documentation

## 2016-06-08 DIAGNOSIS — E785 Hyperlipidemia, unspecified: Secondary | ICD-10-CM | POA: Insufficient documentation

## 2016-06-08 DIAGNOSIS — E669 Obesity, unspecified: Secondary | ICD-10-CM | POA: Insufficient documentation

## 2016-06-08 DIAGNOSIS — Z90710 Acquired absence of both cervix and uterus: Secondary | ICD-10-CM | POA: Insufficient documentation

## 2016-06-08 DIAGNOSIS — Z955 Presence of coronary angioplasty implant and graft: Secondary | ICD-10-CM | POA: Diagnosis not present

## 2016-06-08 DIAGNOSIS — I69393 Ataxia following cerebral infarction: Secondary | ICD-10-CM | POA: Insufficient documentation

## 2016-06-08 NOTE — Progress Notes (Signed)
Subjective:    Patient ID: Megan Atkinson, female    DOB: 1950-09-09, 66 y.o.   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  In the last 24 hours, has pain interfered with the following? General activity 0 Relation with others 0 Enjoyment of life 0 What TIME of day is your pain at its worst? . Sleep (in general) Good  Pain is worse with: na Pain improves with: na Relief from Meds: na  Mobility use a walker  Function retired  Neuro/Psych trouble walking  Prior Studies Any changes since last visit?  no  Physicians involved in your care Any changes since last visit?  no   Family History  Problem Relation Age of Onset  . Coronary artery disease      No family history  . Heart disease Mother   . Heart disease Father   . Heart disease Maternal Grandmother    Social History   Social History  . Marital status: Single    Spouse name: N/A  . Number of children: 2  . Years of education: N/A   Occupational History  .  [employer]   Social History Main Topics  . Smoking status: Never Smoker  . Smokeless tobacco: Never Used  . Alcohol use Yes     Comment: Occasional  . Drug use: No  . Sexual activity: Not on file   Other Topics Concern  . Not on file   Social History Narrative  . No narrative on file   Past Surgical History:  Procedure Laterality Date  . ABDOMINAL HYSTERECTOMY    . APPENDECTOMY    . CORONARY ARTERY BYPASS GRAFT  01/12/2011   Procedure: CORONARY ARTERY BYPASS GRAFTING (CABG);  Surgeon: Alleen BorneBryan K [name], MD;  [facility];  Service: Open Heart Surgery;  Laterality: N/A;  coronary artery bypass graft times one using left internal mammary artery .  Attempted endoscopic saphenous vein harvest  . LEFT HEART CATHETERIZATION WITH CORONARY ANGIOGRAM N/A 01/06/2011   Procedure: LEFT HEART CATHETERIZATION WITH CORONARY ANGIOGRAM;  Surgeon: Rollene RotundaJames Hochrein, MD;  [facility];  Service: Cardiovascular;  Laterality: N/A;   Past Medical History:  Diagnosis Date  . CAD (coronary artery disease)    small NSTEMI 11/12: LHC 01/06/11:  LAD 90-95%, mD1 90%, pD2 50%,  pOM1 50%, RCA occluded,  EF > 65%;  CABG  12/12: L-LAD  . Diabetes mellitus   . Glucose intolerance (impaired glucose tolerance)    Hemoglobin A1c 6.4 in 12/2010  . HTN (hypertension)    Echo 01/06/11: EF 65%  . Hyperlipidemia   . Obesity   . Stroke (HCC) 03/06/2016   There were no vitals taken for this visit.  Opioid Risk Score:   Fall Risk Score:  `1  Depression screen PHQ 2/9  Depression screen PHQ 2/9 04/26/2016  Decreased Interest 0  Down, Depressed, Hopeless 0  PHQ - 2 Score 0    Review of Systems  Constitutional: Positive for unexpected weight change.  HENT: Negative.   Eyes: Negative.   Respiratory: Negative.   Cardiovascular: Negative.   Gastrointestinal: Negative.   Endocrine: Negative.   Genitourinary: Negative.   Musculoskeletal: Negative.  Skin: Negative.   Allergic/Immunologic: Negative.   Neurological: Negative.   Hematological: Negative.   Psychiatric/Behavioral: Negative.   All other systems reviewed and are negative.      Objective:   Physical Exam  Constitutional: She is oriented to person, place, and time. She appears well-developed and well-nourished.  HENT:  Head: Normocephalic and atraumatic.  Eyes: Conjunctivae and EOM are normal. Pupils are equal, round, and reactive to light.  Neck: Normal range of motion.  Neurological: She is alert and oriented to person, place, and time. Coordination and gait abnormal.  Mild dysmetria L FNF  +trunkal ataxia , wide based gait  Psychiatric: She has a normal mood and affect. Her speech is  normal and behavior is normal. Judgment and thought content normal. Cognition and memory are normal.  Nursing note and vitals reviewed. Motor strength is 5/5  in bilateral upper and lower limbs  Positive dysdiadochokinesis with rapid alternating pronation supination on the left side  Speech without evidence of dysarthria        Assessment & Plan:   1. Left posterior inferior cerebellar infarct with mild left upper extremity ataxia, moderate truncal ataxia. Overall improving but not ready to return to work  Would benefit from [employer] PT Driving without restriction  Return to clinic 6 weeks. Will discuss return to work at that time  PCP and neurology follow-up for secondary stroke prevention

## 2016-06-09 ENCOUNTER — Ambulatory Visit: Payer: Medicare Other | Admitting: Physical Therapy

## 2016-06-09 ENCOUNTER — Encounter: Payer: Self-pay | Admitting: Physical Therapy

## 2016-06-09 DIAGNOSIS — Z7409 Other reduced mobility: Secondary | ICD-10-CM

## 2016-06-09 DIAGNOSIS — R2689 Other abnormalities of gait and mobility: Secondary | ICD-10-CM

## 2016-06-09 DIAGNOSIS — I639 Cerebral infarction, unspecified: Secondary | ICD-10-CM

## 2016-06-09 DIAGNOSIS — M6281 Muscle weakness (generalized): Secondary | ICD-10-CM

## 2016-06-09 NOTE — Therapy (Signed)
[facility] [address] [place], [place], [address] Phone: [phone]   Fax:  [phone]  Physical Therapy Treatment  Patient Details  Name: Megan NielsenBeverley Atkinson MRN: [id number] Date of Birth: 08-02-1950 Referring Provider: Claudette LawsKirsteins, Andrew  Encounter Date: 06/09/2016      PT End of Session - 06/09/16 1014    Visit Number 11   Date for PT Re-Evaluation 07/05/16   PT Start Time 0930   PT Stop Time 1011   PT Time Calculation (min) 41 min      Past Medical History:  Diagnosis Date  . CAD (coronary artery disease)    small NSTEMI 11/12: LHC 01/06/11:  LAD 90-95%, mD1 90%, pD2 50%,  pOM1 50%, RCA occluded,  EF > 65%;  CABG  12/12: L-LAD  . Diabetes mellitus   . Glucose intolerance (impaired glucose tolerance)    Hemoglobin A1c 6.4 in 12/2010  . HTN (hypertension)    Echo 01/06/11: EF 65%  . Hyperlipidemia   . Obesity   . Stroke [facility](HCC) 03/06/2016    Past Surgical History:  Procedure Laterality Date  . ABDOMINAL HYSTERECTOMY    . APPENDECTOMY    . CORONARY ARTERY BYPASS GRAFT  01/12/2011   Procedure: CORONARY ARTERY BYPASS GRAFTING (CABG);  Surgeon: Alleen BorneBryan K [name], MD;  [facility];  Service: Open Heart Surgery;  Laterality: N/A;  coronary artery bypass graft times one using left internal mammary artery . Attempted endoscopic saphenous vein harvest  . LEFT HEART CATHETERIZATION WITH CORONARY ANGIOGRAM N/A 01/06/2011   Procedure: LEFT HEART CATHETERIZATION WITH CORONARY ANGIOGRAM;  Surgeon: Rollene RotundaJames Hochrein, MD;  [facility];  Service: Cardiovascular;  Laterality: N/A;    There were no vitals filed for this visit.      Subjective Assessment - 06/09/16 0946    Subjective Pt. reports no pain. Pt stated that she came here alone today so she is tired from having to get ready and getting here without help today.                         [facility] Adult PT Treatment/Exercise - 06/09/16 0001       Ambulation/Gait   Assistive device Rolling walker   Ambulation Surface Indoor;Outdoor;Unlevel   Gait Comments amb from clinic to car     High Level Balance   High Level Balance Activities Negotitating around obstacles  heading out to car     Lumbar Exercises: Aerobic   UBE (Upper Arm Bike) L2 fwd 3/back 3     Lumbar Exercises: Standing   Row Both;15 reps   Theraband Level (Row) Level 3 (Green)   Shoulder Extension 15 reps;Both   Theraband Level (Shoulder Extension) Level 3 (Green)   Other Standing Lumbar Exercises Bicep curls 2x10 3lbs                  PT Short Term Goals - 06/09/16 1021      PT SHORT TERM GOAL #1   Title pt will perform Berg balance test for balance screen   Status On-going           PT Long Term Goals - 06/09/16 1021      PT LONG TERM GOAL #1   Title Pt will improve 6min walk to 500 feet with LRAD   Status On-going     PT LONG TERM GOAL #2   Title Pt will perform sit to stand x 5 in  45seconds   Status On-going     PT LONG TERM GOAL #3   Title pt will report the ability to walk up the stairs at home carrying an object   Status On-going     PT LONG TERM GOAL #4   Title pt will report return to work readiness               Plan - 06/09/16 1014    Clinical Impression Statement Pt completed all exercises today. Pt fatigued quickly and required serveral rest breaks during and in between exercises due to having to get ready and coming to therapy without her aide today. Pt CGA while ambulating down hallway with wheelchair follow. Pt required VC to relax shoulders and constant encouragement to keep moving. Pt very anxious to get rid of walker when at vehicle.. Pt extremely fatigued once in vehicle.    PT Duration 8 weeks   PT Treatment/Interventions Electrical Stimulation;Stair training;Gait training;Therapeutic activities;Therapeutic exercise;Neuromuscular re-education;Patient/family education;Passive range of motion   PT Next  Visit Plan gait, endurance, strengthening of the LEs      Patient will benefit from skilled therapeutic intervention in order to improve the following deficits and impairments:  Decreased activity tolerance, Decreased strength, Difficulty walking, Decreased balance, Decreased endurance, Abnormal gait  Visit Diagnosis: No diagnosis found.     Problem List Patient Active Problem List   Diagnosis Date Noted  . Acute blood loss anemia   . Elevated serum creatinine   . Slow transit constipation   . Benign essential HTN   . Generalized anxiety disorder   . Gait disturbance, post-stroke 03/13/2016  . Cerebellar stroke, acute (HCC) 03/10/2016  . Super obese   . Prediabetes   . Ataxia, post-stroke   . Acute cystitis without hematuria   . CVA (cerebral vascular accident) (HCC) 03/06/2016  . Morbid obesity (HCC) 02/29/2016  . OSA (obstructive sleep apnea) 11/24/2014  . Dyspnea 10/02/2014  . Anemia 04/05/2011  . Urinary tract infection, site not specified 04/05/2011  . Diabetes mellitus (HCC) 02/22/2011  . Hyperlipidemia 02/22/2011  . CAD (coronary artery disease) 01/07/2011  . Hypertension 01/07/2011    Megan Atkinson 06/09/2016, 10:25 AM  [facility] [address] [place], [place], [address] Phone: [phone]   Fax:  [phone]  Name: Megan Atkinson MRN: [id number] Date of Birth: 05-08-1950

## 2016-06-12 ENCOUNTER — Encounter: Payer: Self-pay | Admitting: Physical Therapy

## 2016-06-12 ENCOUNTER — Ambulatory Visit: Payer: Medicare Other | Admitting: Physical Therapy

## 2016-06-12 DIAGNOSIS — Z7409 Other reduced mobility: Secondary | ICD-10-CM

## 2016-06-12 DIAGNOSIS — M6281 Muscle weakness (generalized): Secondary | ICD-10-CM

## 2016-06-12 DIAGNOSIS — R2689 Other abnormalities of gait and mobility: Secondary | ICD-10-CM | POA: Diagnosis not present

## 2016-06-12 DIAGNOSIS — I639 Cerebral infarction, unspecified: Secondary | ICD-10-CM

## 2016-06-12 NOTE — Therapy (Signed)
[facility] [address] [place], [place], [address] Phone: [phone]   Fax:  [phone]  Physical Therapy Treatment  Patient Details  Name: Megan NielsenBeverley Atkinson MRN: [id number] Date of Birth: 01/31/1951 Referring Provider: Claudette LawsKirsteins, Andrew  Encounter Date: 06/12/2016      PT End of Session - 06/12/16 1230    Visit Number 12   Date for PT Re-Evaluation 07/05/16   PT Start Time 1145   PT Stop Time 1228   PT Time Calculation (min) 43 min   Activity Tolerance Patient limited by fatigue;Patient tolerated treatment well   Behavior During Therapy [facility] for tasks assessed/performed      Past Medical History:  Diagnosis Date  . CAD (coronary artery disease)    small NSTEMI 11/12: LHC 01/06/11:  LAD 90-95%, mD1 90%, pD2 50%,  pOM1 50%, RCA occluded,  EF > 65%;  CABG  12/12: L-LAD  . Diabetes mellitus   . Glucose intolerance (impaired glucose tolerance)    Hemoglobin A1c 6.4 in 12/2010  . HTN (hypertension)    Echo 01/06/11: EF 65%  . Hyperlipidemia   . Obesity   . Stroke [facility](HCC) 03/06/2016    Past Surgical History:  Procedure Laterality Date  . ABDOMINAL HYSTERECTOMY    . APPENDECTOMY    . CORONARY ARTERY BYPASS GRAFT  01/12/2011   Procedure: CORONARY ARTERY BYPASS GRAFTING (CABG);  Surgeon: Alleen BorneBryan K [name], MD;  [facility];  Service: Open Heart Surgery;  Laterality: N/A;  coronary artery bypass graft times one using left internal mammary artery . Attempted endoscopic saphenous vein harvest  . LEFT HEART CATHETERIZATION WITH CORONARY ANGIOGRAM N/A 01/06/2011   Procedure: LEFT HEART CATHETERIZATION WITH CORONARY ANGIOGRAM;  Surgeon: Rollene RotundaJames Hochrein, MD;  [facility];  Service: Cardiovascular;  Laterality: N/A;    There were no vitals filed for this visit.      Subjective Assessment - 06/12/16 1147    Subjective "just wonderful"   Currently in Pain? No/denies   Pain Score 0-No pain                          [facility] Adult PT Treatment/Exercise - 06/12/16 0001      Ambulation/Gait   Ambulation/Gait Yes   Ambulation Distance (Feet) 16 Feet   Assistive device Straight cane   Gait Pattern Shuffle   Ambulation Surface Level;Indoor   Gait Comments 2nd gait trial pt ambulated with RTW down hall ~100 ft.     Lumbar Exercises: Aerobic   Stationary Bike NuStep L5 x8 min     Lumbar Exercises: Standing   Row Both;15 reps   Theraband Level (Row) Level 3 (Green)     Knee/Hip Exercises: Standing   Other Standing Knee Exercises Standing hip abd 2x10   Other Standing Knee Exercises Standing march 2x10 with RW     Knee/Hip Exercises: Seated   Sit to Sand 2 sets;5 reps                  PT Short Term Goals - 06/09/16 1021      PT SHORT TERM GOAL #1   Title pt will perform Berg balance test for balance screen   Status On-going           PT Long Term Goals - 06/12/16 1231      PT LONG TERM GOAL #1   Title Pt will improve 6min walk to 500 feet with LRAD  Status On-going     PT LONG TERM GOAL #2   Title Pt will perform sit to stand x 5 in 45seconds   Status On-going     PT LONG TERM GOAL #3   Title pt will report the ability to walk up the stairs at home carrying an object   Status On-going     PT LONG TERM GOAL #4   Title pt will report return to work readiness   Status On-going               Plan - 06/12/16 1231    Clinical Impression Statement Pt fatigues quick with exercise interventions requiring multiple rest breaks. Pt performed all exercises well but does require max encouragement to perform more functional interventions. RLE does circumduct with ambulation, decrease R hip and knee flex.   Rehab Potential Good   PT Duration 8 weeks   PT Treatment/Interventions Electrical Stimulation;Stair training;Gait training;Therapeutic activities;Therapeutic exercise;Neuromuscular re-education;Patient/family education;Passive range  of motion   PT Next Visit Plan gait, endurance, strengthening of the LEs      Patient will benefit from skilled therapeutic intervention in order to improve the following deficits and impairments:  Decreased activity tolerance, Decreased strength, Difficulty walking, Decreased balance, Decreased endurance, Abnormal gait  Visit Diagnosis: Decreased functional mobility and endurance  Muscle weakness (generalized)  Cerebellar stroke, acute [facility])     Problem List Patient Active Problem List   Diagnosis Date Noted  . Acute blood loss anemia   . Elevated serum creatinine   . Slow transit constipation   . Benign essential HTN   . Generalized anxiety disorder   . Gait disturbance, post-stroke 03/13/2016  . Cerebellar stroke, acute (HCC) 03/10/2016  . Super obese   . Prediabetes   . Ataxia, post-stroke   . Acute cystitis without hematuria   . CVA (cerebral vascular accident) (HCC) 03/06/2016  . Morbid obesity (HCC) 02/29/2016  . OSA (obstructive sleep apnea) 11/24/2014  . Dyspnea 10/02/2014  . Anemia 04/05/2011  . Urinary tract infection, site not specified 04/05/2011  . Diabetes mellitus (HCC) 02/22/2011  . Hyperlipidemia 02/22/2011  . CAD (coronary artery disease) 01/07/2011  . Hypertension 01/07/2011    Grayce Sessions, PTA 06/12/2016, 12:35 PM  [facility] [address] [place], [place], [address] Phone: [phone]   Fax:  [phone]  Name: Megan Atkinson MRN: [id number] Date of Birth: 15-Aug-1950

## 2016-06-14 ENCOUNTER — Ambulatory Visit: Payer: Medicare Other | Admitting: Physical Therapy

## 2016-06-14 ENCOUNTER — Encounter: Payer: Self-pay | Admitting: Physical Therapy

## 2016-06-14 DIAGNOSIS — I639 Cerebral infarction, unspecified: Secondary | ICD-10-CM

## 2016-06-14 DIAGNOSIS — Z7409 Other reduced mobility: Secondary | ICD-10-CM

## 2016-06-14 DIAGNOSIS — M6281 Muscle weakness (generalized): Secondary | ICD-10-CM

## 2016-06-14 DIAGNOSIS — R2689 Other abnormalities of gait and mobility: Secondary | ICD-10-CM | POA: Diagnosis not present

## 2016-06-14 NOTE — Therapy (Signed)
[facility] [place] [address] [place], [place], [address] Phone: [phone]   Fax:  [phone]  Physical Therapy Treatment  Patient Details  Name: Megan Atkinson MRN: [id number] Date of Birth: 02-15-1950 Referring Provider: Claudette Laws  Encounter Date: 06/14/2016      PT End of Session - 06/14/16 1226    Visit Number 13   Date for PT Re-Evaluation 07/05/16   PT Start Time 1145   PT Stop Time 1226   PT Time Calculation (min) 41 min   Activity Tolerance Patient limited by fatigue;Patient tolerated treatment well   Behavior During Therapy [facility] for tasks assessed/performed      Past Medical History:  Diagnosis Date  . CAD (coronary artery disease)    small NSTEMI 11/12: LHC 01/06/11:  LAD 90-95%, mD1 90%, pD2 50%,  pOM1 50%, RCA occluded,  EF > 65%;  CABG  12/12: L-LAD  . Diabetes mellitus   . Glucose intolerance (impaired glucose tolerance)    Hemoglobin A1c 6.4 in 12/2010  . HTN (hypertension)    Echo 01/06/11: EF 65%  . Hyperlipidemia   . Obesity   . Stroke [facility]) 03/06/2016    Past Surgical History:  Procedure Laterality Date  . ABDOMINAL HYSTERECTOMY    . APPENDECTOMY    . CORONARY ARTERY BYPASS GRAFT  01/12/2011   Procedure: CORONARY ARTERY BYPASS GRAFTING (CABG);  Surgeon: Alleen Borne, MD;  Location: [facility] OR;  Service: Open Heart Surgery;  Laterality: N/A;  coronary artery bypass graft times one using left internal mammary artery . Attempted endoscopic saphenous vein harvest  . LEFT HEART CATHETERIZATION WITH CORONARY ANGIOGRAM N/A 01/06/2011   Procedure: LEFT HEART CATHETERIZATION WITH CORONARY ANGIOGRAM;  Surgeon: Rollene Rotunda, MD;  Location: [facility] CATH LAB;  Service: Cardiovascular;  Laterality: N/A;    There were no vitals filed for this visit.      Subjective Assessment - 06/14/16 1147    Subjective Pt reports that things are going ok   Currently in Pain? No/denies   Pain Score 0-No pain                          [facility] Adult PT Treatment/Exercise - 06/14/16 0001      Ambulation/Gait   Ambulation/Gait Yes   Ambulation Distance (Feet) 36 Feet   Assistive device Straight cane   Gait Pattern Shuffle   Ambulation Surface Level;Indoor   Gait Comments increase time needed; 2nd gait trial pt ambulated with RTW down hall ~180 ft. to car     Lumbar Exercises: Aerobic   Stationary Bike NuStep L5 x8 min     Lumbar Exercises: Standing   Row Both;20 reps;Theraband  x2   Theraband Level (Row) Level 2 (Red)     Knee/Hip Exercises: Standing   Other Standing Knee Exercises Standing toe clears 4in with SPC 2x5 each   Other Standing Knee Exercises Standing march 2x10 with RW     Knee/Hip Exercises: Seated   Sit to Sand 2 sets;5 reps                  PT Short Term Goals - 06/09/16 1021      PT SHORT TERM GOAL #1   Title pt will perform Berg balance test for balance screen   Status On-going           PT Long Term Goals - 06/12/16 1231      PT LONG TERM  GOAL #1   Title Pt will improve 6min walk to 500 feet with LRAD   Status On-going     PT LONG TERM GOAL #2   Title Pt will perform sit to stand x 5 in 45seconds   Status On-going     PT LONG TERM GOAL #3   Title pt will report the ability to walk up the stairs at home carrying an object   Status On-going     PT LONG TERM GOAL #4   Title pt will report return to work readiness   Status On-going               Plan - 06/14/16 1226    Clinical Impression Statement Pt can be very self limiting, often saying she cant do some of the interventions. Completed all of today's exercises well but does require max encouragement. Pt with no reports of increase pain.    PT Duration 8 weeks   PT Treatment/Interventions Electrical Stimulation;Stair training;Gait training;Therapeutic activities;Therapeutic exercise;Neuromuscular re-education;Patient/family education;Passive range of motion   PT Next  Visit Plan gait, endurance, strengthening of the LEs      Patient will benefit from skilled therapeutic intervention in order to improve the following deficits and impairments:  Decreased activity tolerance, Decreased strength, Difficulty walking, Decreased balance, Decreased endurance, Abnormal gait  Visit Diagnosis: Decreased functional mobility and endurance  Cerebellar stroke, acute (HCC)  Muscle weakness (generalized)     Problem List Patient Active Problem List   Diagnosis Date Noted  . Acute blood loss anemia   . Elevated serum creatinine   . Slow transit constipation   . Benign essential HTN   . Generalized anxiety disorder   . Gait disturbance, post-stroke 03/13/2016  . Cerebellar stroke, acute (HCC) 03/10/2016  . Super obese   . Prediabetes   . Ataxia, post-stroke   . Acute cystitis without hematuria   . CVA (cerebral vascular accident) (HCC) 03/06/2016  . Morbid obesity (HCC) 02/29/2016  . OSA (obstructive sleep apnea) 11/24/2014  . Dyspnea 10/02/2014  . Anemia 04/05/2011  . Urinary tract infection, site not specified 04/05/2011  . Diabetes mellitus (HCC) 02/22/2011  . Hyperlipidemia 02/22/2011  . CAD (coronary artery disease) 01/07/2011  . Hypertension 01/07/2011    Grayce Sessionsonald G Danisa Kopec, PTA 06/14/2016, 12:29 PM  [facility] [address] [place], [place], [address] Phone: [phone]   Fax:  [phone]  Name: Megan NielsenBeverley Atkinson MRN: [id number] Date of Birth: [birth year]

## 2016-06-16 ENCOUNTER — Encounter: Payer: Self-pay | Admitting: Physical Therapy

## 2016-06-16 ENCOUNTER — Ambulatory Visit: Payer: Medicare Other | Admitting: Physical Therapy

## 2016-06-16 DIAGNOSIS — I639 Cerebral infarction, unspecified: Secondary | ICD-10-CM

## 2016-06-16 DIAGNOSIS — R2689 Other abnormalities of gait and mobility: Secondary | ICD-10-CM | POA: Diagnosis not present

## 2016-06-16 DIAGNOSIS — M6281 Muscle weakness (generalized): Secondary | ICD-10-CM

## 2016-06-16 DIAGNOSIS — Z7409 Other reduced mobility: Secondary | ICD-10-CM

## 2016-06-16 NOTE — Therapy (Signed)
[facility] [place], [place], [address] Phone: [phone]   Fax:  [phone]  Physical Therapy Treatment  Patient Details  Name: Megan Atkinson MRN: [id number] Date of Birth: [birth year] Referring Provider: Alysia Penna  Encounter Date: 06/16/2016      PT End of Session - 06/16/16 1145    Visit Number 14   Date for PT Re-Evaluation 07/05/16   PT Start Time 1100   PT Stop Time 1145   PT Time Calculation (min) 45 min   Activity Tolerance Patient limited by fatigue;Patient tolerated treatment well   Behavior During Therapy [facility] for tasks assessed/performed      Past Medical History:  Diagnosis Date  . CAD (coronary artery disease)    small NSTEMI 11/12: LHC 01/06/11:  LAD 90-95%, mD1 90%, pD2 50%,  pOM1 50%, RCA occluded,  EF > 65%;  CABG  12/12: L-LAD  . Diabetes mellitus   . Glucose intolerance (impaired glucose tolerance)    Hemoglobin A1c 6.4 in 12/2010  . HTN (hypertension)    Echo 01/06/11: EF 65%  . Hyperlipidemia   . Obesity   . Stroke [facility]) 03/06/2016    Past Surgical History:  Procedure Laterality Date  . ABDOMINAL HYSTERECTOMY    . APPENDECTOMY    . CORONARY ARTERY BYPASS GRAFT  01/12/2011   Procedure: CORONARY ARTERY BYPASS GRAFTING (CABG);  Surgeon: Gaye Pollack, MD;  Location: [place];  Service: Open Heart Surgery;  Laterality: N/A;  coronary artery bypass graft times one using left internal mammary artery . Attempted endoscopic saphenous vein harvest  . LEFT HEART CATHETERIZATION WITH CORONARY ANGIOGRAM N/A 01/06/2011   Procedure: LEFT HEART CATHETERIZATION WITH CORONARY ANGIOGRAM;  Surgeon: Minus Breeding, MD;  [facility] LAB;  Service: Cardiovascular;  Laterality: N/A;    There were no vitals filed for this visit.      Subjective Assessment - 06/16/16 1100    Subjective "Doing ok, hanging in there"   Currently in Pain? No/denies   Pain Score 0-No pain                          [facility] Adult PT Treatment/Exercise - 06/16/16 0001      Ambulation/Gait   Ambulation Distance (Feet) 180 Feet   Assistive device Rolling walker   Gait Pattern Shuffle;Poor foot clearance - right;Abducted- right;Right circumduction   Ambulation Surface Level;Unlevel;Indoor;Outdoor;Paved   Gait Comments multuple standing rest breaks     Lumbar Exercises: Aerobic   Stationary Bike NuStep L5 x6 min   UBE (Upper Arm Bike) L3 fwd 3/back 3     Knee/Hip Exercises: Standing   Other Standing Knee Exercises Standing toe clears 4in with SPC x4 each  pt can be very self liminting    Other Standing Knee Exercises Standing march 2x10 with RW one sec hold at top     Knee/Hip Exercises: Seated   Hamstring Curl 15 reps;2 sets   Hamstring Limitations blue tband    Sit to Sand 5 reps;3 sets                  PT Short Term Goals - 06/09/16 1021      PT SHORT TERM GOAL #1   Title pt will perform Berg balance test for balance screen   Status On-going           PT Long Term Goals - 06/16/16 1116  PT LONG TERM GOAL #1   Title Pt will improve 29mn walk to 500 feet with LRAD     PT LONG TERM GOAL #2   Title Pt will perform sit to stand x 5 in 45seconds   Status Partially Met  44.6, LE's does come in contact with mat table despite cues               Plan - 06/16/16 1146    Clinical Impression Statement Pt continues to be self limiting with some of the interventions. Unable to complete four inch toe clears with SPC due to fear and nervousness. Increase time needed to complete activities due to patient needing frequent rest breaks   Rehab Potential Good   PT Duration 8 weeks   PT Treatment/Interventions Electrical Stimulation;Stair training;Gait training;Therapeutic activities;Therapeutic exercise;Neuromuscular re-education;Patient/family education;Passive range of motion   PT Next Visit Plan gait, endurance, strengthening of the LEs       Patient will benefit from skilled therapeutic intervention in order to improve the following deficits and impairments:  Decreased activity tolerance, Decreased strength, Difficulty walking, Decreased balance, Decreased endurance, Abnormal gait  Visit Diagnosis: Decreased functional mobility and endurance  Cerebellar stroke, acute (HCC)  Muscle weakness (generalized)     Problem List Patient Active Problem List   Diagnosis Date Noted  . Acute blood loss anemia   . Elevated serum creatinine   . Slow transit constipation   . Benign essential HTN   . Generalized anxiety disorder   . Gait disturbance, post-stroke 03/13/2016  . Cerebellar stroke, acute ([place] 03/10/2016  . Super obese   . Prediabetes   . Ataxia, post-stroke   . Acute cystitis without hematuria   . CVA (cerebral vascular accident) ([place] 03/06/2016  . Morbid obesity ([place] 02/29/2016  . OSA (obstructive sleep apnea) 11/24/2014  . Dyspnea 10/02/2014  . Anemia 04/05/2011  . Urinary tract infection, site not specified 04/05/2011  . Diabetes mellitus ([place] 02/22/2011  . Hyperlipidemia 02/22/2011  . CAD (coronary artery disease) 01/07/2011  . Hypertension 01/07/2011    RScot Jun PTA 06/16/2016, 11:48 AM  [id number] [address]Phone: [phone]  Fax:  [phone] Name: BCharleen MaderaMRN: [id number] of Birth: 4January 28, 1952

## 2016-06-19 ENCOUNTER — Encounter: Payer: Self-pay | Admitting: Physical Therapy

## 2016-06-19 ENCOUNTER — Ambulatory Visit: Payer: Medicare Other | Admitting: Physical Therapy

## 2016-06-19 DIAGNOSIS — I639 Cerebral infarction, unspecified: Secondary | ICD-10-CM

## 2016-06-19 DIAGNOSIS — M6281 Muscle weakness (generalized): Secondary | ICD-10-CM

## 2016-06-19 DIAGNOSIS — R2689 Other abnormalities of gait and mobility: Secondary | ICD-10-CM | POA: Diagnosis not present

## 2016-06-19 DIAGNOSIS — Z7409 Other reduced mobility: Secondary | ICD-10-CM

## 2016-06-19 NOTE — Therapy (Signed)
[place], [place], [address] Phone: [phone]   Fax:  [phone]  Physical Therapy Treatment  Patient Details  Name: Megan Atkinson MRN: [id number] Date of Birth: [birth year] Referring Provider: Alysia Penna  Encounter Date: 06/19/2016      PT End of Session - 06/19/16 1227    Visit Number 15   Date for PT Re-Evaluation 07/05/16   PT Start Time 2197   PT Stop Time 1227   PT Time Calculation (min) 42 min   Activity Tolerance Patient limited by fatigue;Patient tolerated treatment well   Behavior During Therapy [facility] for tasks assessed/performed      Past Medical History:  Diagnosis Date  . CAD (coronary artery disease)    small NSTEMI 11/12: LHC 01/06/11:  LAD 90-95%, mD1 90%, pD2 50%,  pOM1 50%, RCA occluded,  EF > 65%;  CABG  12/12: L-LAD  . Diabetes mellitus   . Glucose intolerance (impaired glucose tolerance)    Hemoglobin A1c 6.4 in 12/2010  . HTN (hypertension)    Echo 01/06/11: EF 65%  . Hyperlipidemia   . Obesity   . Stroke [facility]) 03/06/2016    Past Surgical History:  Procedure Laterality Date  . ABDOMINAL HYSTERECTOMY    . APPENDECTOMY    . CORONARY ARTERY BYPASS GRAFT  01/12/2011   Procedure: CORONARY ARTERY BYPASS GRAFTING (CABG);  Surgeon: Gaye Pollack, MD;  Location: [place];  Service: Open Heart Surgery;  Laterality: N/A;  coronary artery bypass graft times one using left internal mammary artery . Attempted endoscopic saphenous vein harvest  . LEFT HEART CATHETERIZATION WITH CORONARY ANGIOGRAM N/A 01/06/2011   Procedure: LEFT HEART CATHETERIZATION WITH CORONARY ANGIOGRAM;  Surgeon: Minus Breeding, MD;  Location: [facility] CATH LAB;  Service: Cardiovascular;  Laterality: N/A;    There were no vitals filed for this visit.      Subjective Assessment - 06/19/16 1146    Subjective 'Great'   Currently in Pain? No/denies   Pain Score 0-No pain                          [facility] Adult PT Treatment/Exercise - 06/19/16 0001      Lumbar Exercises: Aerobic   Stationary Bike NuStep L3 x8 min   UBE (Upper Arm Bike) standing L1 x3 min      Lumbar Exercises: Standing   Other Standing Lumbar Exercises Straignt arm pull downs 25lb 2x15   Other Standing Lumbar Exercises rev grip rows 25lb 2x15     Knee/Hip Exercises: Standing   Forward Step Up Both;2 sets;10 reps;Step Height: 4";5 reps;Step Height: 6"                  PT Short Term Goals - 06/09/16 1021      PT SHORT TERM GOAL #1   Title pt will perform Berg balance test for balance screen   Status On-going           PT Long Term Goals - 06/16/16 1116      PT LONG TERM GOAL #1   Title Pt will improve 36mn walk to 500 feet with LRAD     PT LONG TERM GOAL #2   Title Pt will perform sit to stand x 5 in 45seconds   Status Partially Met  44.6, LE's does come in contact with mat table despite cues  Plan - 06/19/16 1227    Clinical Impression Statement Increase tine needed while on step ups, due to pt being nervous and anxious. Pt able to progress and perform all interventions in standing position. Frequent rest breaks needed due to fatigue. Pt    Rehab Potential Good   PT Duration 8 weeks   PT Treatment/Interventions Electrical Stimulation;Stair training;Gait training;Therapeutic activities;Therapeutic exercise;Neuromuscular re-education;Patient/family education;Passive range of motion   PT Next Visit Plan gait, endurance, strengthening of the LEs      Patient will benefit from skilled therapeutic intervention in order to improve the following deficits and impairments:  Decreased activity tolerance, Decreased strength, Difficulty walking, Decreased balance, Decreased endurance, Abnormal gait  Visit Diagnosis: Decreased functional mobility and endurance  Cerebellar stroke, acute (HCC)  Muscle weakness  (generalized)     Problem List Patient Active Problem List   Diagnosis Date Noted  . Acute blood loss anemia   . Elevated serum creatinine   . Slow transit constipation   . Benign essential HTN   . Generalized anxiety disorder   . Gait disturbance, post-stroke 03/13/2016  . Cerebellar stroke, acute ([place]) 03/10/2016  . Super obese   . Prediabetes   . Ataxia, post-stroke   . Acute cystitis without hematuria   . CVA (cerebral vascular accident) ([place]) 03/06/2016  . Morbid obesity ([place]) 02/29/2016  . OSA (obstructive sleep apnea) 11/24/2014  . Dyspnea 10/02/2014  . Anemia 04/05/2011  . Urinary tract infection, site not specified 04/05/2011  . Diabetes mellitus ([place]) 02/22/2011  . Hyperlipidemia 02/22/2011  . CAD (coronary artery disease) 01/07/2011  . Hypertension 01/07/2011    Scot Jun, PTA 06/19/2016, 12:29 PM  [place], [place], [address] Phone: [phone]   Fax:  [phone]  Name: Megan Atkinson MRN: [id number] Date of Birth: 19-Aug-1950

## 2016-06-21 ENCOUNTER — Ambulatory Visit: Payer: Medicare Other | Admitting: Physical Therapy

## 2016-06-21 ENCOUNTER — Encounter: Payer: Self-pay | Admitting: Physical Therapy

## 2016-06-21 DIAGNOSIS — Z7409 Other reduced mobility: Secondary | ICD-10-CM

## 2016-06-21 DIAGNOSIS — I639 Cerebral infarction, unspecified: Secondary | ICD-10-CM

## 2016-06-21 DIAGNOSIS — R2689 Other abnormalities of gait and mobility: Secondary | ICD-10-CM | POA: Diagnosis not present

## 2016-06-21 DIAGNOSIS — M6281 Muscle weakness (generalized): Secondary | ICD-10-CM

## 2016-06-21 NOTE — Therapy (Signed)
[place], [place], [address] Phone: [phone]   Fax:  [phone]  Physical Therapy Treatment  Patient Details  Name: Megan Atkinson MRN: [id number] Date of Birth: May 04, 1950 Referring Provider: Alysia Penna  Encounter Date: 06/21/2016      PT End of Session - 06/21/16 1225    Visit Number 16   Date for PT Re-Evaluation 07/05/16   PT Start Time 1143   PT Stop Time 1225   PT Time Calculation (min) 42 min   Activity Tolerance Patient limited by fatigue;Patient tolerated treatment well   Behavior During Therapy [facility] for tasks assessed/performed      Past Medical History:  Diagnosis Date  . CAD (coronary artery disease)    small NSTEMI 11/12: LHC 01/06/11:  LAD 90-95%, mD1 90%, pD2 50%,  pOM1 50%, RCA occluded,  EF > 65%;  CABG  12/12: L-LAD  . Diabetes mellitus   . Glucose intolerance (impaired glucose tolerance)    Hemoglobin A1c 6.4 in 12/2010  . HTN (hypertension)    Echo 01/06/11: EF 65%  . Hyperlipidemia   . Obesity   . Stroke [facility]) 03/06/2016    Past Surgical History:  Procedure Laterality Date  . ABDOMINAL HYSTERECTOMY    . APPENDECTOMY    . CORONARY ARTERY BYPASS GRAFT  01/12/2011   Procedure: CORONARY ARTERY BYPASS GRAFTING (CABG);  Surgeon: Gaye Pollack, MD;  Location: [place];  Service: Open Heart Surgery;  Laterality: N/A;  coronary artery bypass graft times one using left internal mammary artery . Attempted endoscopic saphenous vein harvest  . LEFT HEART CATHETERIZATION WITH CORONARY ANGIOGRAM N/A 01/06/2011   Procedure: LEFT HEART CATHETERIZATION WITH CORONARY ANGIOGRAM;  Surgeon: Minus Breeding, MD;  Location[facility] C[facility] LAB;  Service: Cardiovascular;  Laterality: N/A;    There were no vitals filed for this visit.      Subjective Assessment - 06/21/16 1147    Subjective "Stayed in bed all day yesterday because my legs would not move, they were too sore"   Currently in Pain? No/denies   Pain Score 0-No pain                         [facility] Adult PT Treatment/Exercise - 06/21/16 0001      Lumbar Exercises: Aerobic   Stationary Bike NuStep L3 x8 min   UBE (Upper Arm Bike) L1 x3 min      Lumbar Exercises: Standing   Other Standing Lumbar Exercises Straignt arm pull downs 25lb 2x15   Other Standing Lumbar Exercises rev grip rows 25lb 2x15     Knee/Hip Exercises: Standing   Forward Step Up Both;2 sets;10 reps;Step Height: 4";5 reps;Step Height: 6"                  PT Short Term Goals - 06/09/16 1021      PT SHORT TERM GOAL #1   Title pt will perform Berg balance test for balance screen   Status On-going           PT Long Term Goals - 06/21/16 1158      PT LONG TERM GOAL #1   Title Pt will improve 68mn walk to 500 feet with LRAD   Status On-going     PT LONG TERM GOAL #2   Title Pt will perform sit to stand x 5 in 45seconds   Status Partially Met     PT LONG TERM  GOAL #3   Title pt will report the ability to walk up the stairs at home carrying an object   Status Partially [place] #4   Title pt will report return to work readiness   Status Partially Met               Plan - 06/21/16 1225    Clinical Impression Statement Pt a little more self limiting with standing tolerance, pt fefused to stand during UB to help with standing endurance. More seated rest bakes taken between interventions. Pt often times say she cant do something then complete the intervention.    Rehab Potential Fair   PT Duration 8 weeks   PT Treatment/Interventions Teacher, adult education training;Gait training;Therapeutic activities;Therapeutic exercise;Neuromuscular re-education;Patient/family education;Passive range of motion   PT Next Visit Plan gait, endurance, strengthening of the LEs      Patient will benefit from skilled therapeutic intervention in order to improve the following deficits  and impairments:  Decreased activity tolerance, Decreased strength, Difficulty walking, Decreased balance, Decreased endurance, Abnormal gait  Visit Diagnosis: Decreased functional mobility and endurance  Cerebellar stroke, acute (HCC)  Muscle weakness (generalized)     Problem List Patient Active Problem List   Diagnosis Date Noted  . Acute blood loss anemia   . Elevated serum creatinine   . Slow transit constipation   . Benign essential HTN   . Generalized anxiety disorder   . Gait disturbance, post-stroke 03/13/2016  . Cerebellar stroke, acute ([place]) 03/10/2016  . Super obese   . Prediabetes   . Ataxia, post-stroke   . Acute cystitis without hematuria   . CVA (cerebral vascular accident) ([place]) 03/06/2016  . Morbid obesity ([place]) 02/29/2016  . OSA (obstructive sleep apnea) 11/24/2014  . Dyspnea 10/02/2014  . Anemia 04/05/2011  . Urinary tract infection, site not specified 04/05/2011  . Diabetes mellitus ([place]) 02/22/2011  . Hyperlipidemia 02/22/2011  . CAD (coronary artery disease) 01/07/2011  . Hypertension 01/07/2011    Scot Jun, PTA 06/21/2016, 12:28 PM  [place], [place], [address] Phone: [phone]   Fax:  [phone]  Name: Megan Atkinson MRN: [id number] Date of Birth: Apr 03, 1950

## 2016-06-22 ENCOUNTER — Ambulatory Visit (INDEPENDENT_AMBULATORY_CARE_PROVIDER_SITE_OTHER): Payer: Medicare Other | Admitting: Nurse Practitioner

## 2016-06-22 ENCOUNTER — Encounter: Payer: Self-pay | Admitting: Nurse Practitioner

## 2016-06-22 VITALS — BP 137/74 | HR 66 | Ht 67.0 in

## 2016-06-22 DIAGNOSIS — R269 Unspecified abnormalities of gait and mobility: Secondary | ICD-10-CM

## 2016-06-22 DIAGNOSIS — E785 Hyperlipidemia, unspecified: Secondary | ICD-10-CM | POA: Diagnosis not present

## 2016-06-22 DIAGNOSIS — I639 Cerebral infarction, unspecified: Secondary | ICD-10-CM | POA: Diagnosis not present

## 2016-06-22 DIAGNOSIS — I10 Essential (primary) hypertension: Secondary | ICD-10-CM

## 2016-06-22 DIAGNOSIS — G4733 Obstructive sleep apnea (adult) (pediatric): Secondary | ICD-10-CM | POA: Diagnosis not present

## 2016-06-22 DIAGNOSIS — I69398 Other sequelae of cerebral infarction: Secondary | ICD-10-CM

## 2016-06-22 NOTE — Progress Notes (Signed)
I agree with the assessment and plan as directed by NP .WID only.   Simi Briel, MD

## 2016-06-22 NOTE — Progress Notes (Signed)
[facility]  PATIENT: Megan NielsenBeverley Atkinson DOB: 01/02/1951   REASON FOR VISIT: follow up for left PICA occlusion HISTORY FROM: Patient    HISTORY OF PRESENT ILLNESS:Megan Catheyis a 66 y.o.femalewho presents with sudden onset vertigo and left-sided numbness/weakness that started around 10:30 AM. She states that they could've been his earliest 9910 AM, and that currently she is continuing to have vertigo, but that her left-sided numbness and weakness is significantly improved. (LKW 03/06/5016 1000a) Even on arrival, it appeared that she had some left-sided weakness, but this improved over the course of her examination. Of note, about a week ago she had similar episode that was transient and improved. On exam, she had an NIH stroke scale of 2, Dr. Amada JupiterKirkpatrick discussed TPA with her son. One item of concern would be the symptoms that she had last week coupled with a subacute-appearing stroke on CT which I think would raise risk. That coupled with the fact that she has improving symptoms, he counseled against TPA and the family agreed. Patient was not administered IV t-PA secondary to mild symptoms, possible recent stroke. She was admitted for further evaluation and treatment.Stroke work up consistent with left PICA occlusion. She stated that her glucose was in good control at home CTA of td possibly occluded left posterior inferior cerebellar artery. Moderate focal stenosis left VA. 2-D echo 60-65% EF. LDL 137. She was placed on Plavix and aspirin for 3 months. She returns to the stroke clinic today for Hospital follow-up. She remains on Plavix and aspirin for secondary stroke prevention without further stroke or TIA symptoms. She has no bruising or bleeding She was made aware she can stop the aspirin now just continue the Plavix. Blood pressure 137/74 in the office. She remains on Lipitor 80 mg daily for hyperlipidmia without complaints of myalgias. She was on Pravachol  prior to  hospitalization. She has a history of obstructive sleep apnea but has not been using her CPAP was encouraged to do so. She continues to get physical therapy and is ambulating with a walker today. She says she is back to driving. She wants to return to work but her therapies need to be concluded. She returns for reevaluation   REVIEW OF SYSTEMS: Full 14 system review of systems performed and notable only for those listed, all others are neg:  Constitutional: neg  Cardiovascular: neg Ear/Nose/Throat: neg  Skin: neg Eyes: neg Respiratory: neg Gastroitestinal: neg  Hematology/Lymphatic: neg  Endocrine: neg Musculoskeletal:walking difficulty Allergy/Immunology: neg Neurological: neg Psychiatric: neg Sleep : obstructive sleep apnea  ALLERGIES: Allergies  Allergen Reactions  . Lisinopril     cough  . Metformin And Related Diarrhea    A lot of fluid loss from bowel and diarrhea    HOME MEDICATIONS: Outpatient Medications Prior to Visit  Medication Sig Dispense Refill  . amLODipine (NORVASC) 2.5 MG tablet Take 1 tablet (2.5 mg total) by mouth daily. 30 tablet 3  . aspirin 325 MG tablet Take 1 tablet (325 mg total) by mouth daily. 100 tablet 0  . atorvastatin (LIPITOR) 80 MG tablet Take 1 tablet (80 mg total) by mouth daily at 6 PM. 90 tablet 1  . Calcium Carbonate-Vitamin D (CALCIUM-VITAMIN D) 500-200 MG-UNIT tablet Take 1 tablet by mouth daily.    . clopidogrel (PLAVIX) 75 MG tablet Take 1 tablet (75 mg total) by mouth daily. 90 tablet 1  . Cyanocobalamin (VITAMIN B-12 IJ) weekly    . fluticasone (FLONASE) 50 MCG/ACT nasal spray Place 1 spray into both  nostrils daily. 9.9 g 0  . metoprolol tartrate (LOPRESSOR) 25 MG tablet Take 0.5 tablets (12.5 mg total) by mouth 2 (two) times daily. 45 tablet 1  . senna-docusate (SENOKOT-S) 8.6-50 MG tablet Take 2 tablets by mouth 2 (two) times daily. 100 tablet 0  . furosemide (LASIX) 20 MG tablet Take 0.5 tablets (10 mg total) by mouth daily. 30  tablet 0   No facility-administered medications prior to visit.     PAST MEDICAL HISTORY: Past Medical History:  Diagnosis Date  . CAD (coronary artery disease)    small NSTEMI 11/12: LHC 01/06/11:  LAD 90-95%, mD1 90%, pD2 50%,  pOM1 50%, RCA occluded,  EF > 65%;  CABG  12/12: L-LAD  . Diabetes mellitus   . Glucose intolerance (impaired glucose tolerance)    Hemoglobin A1c 6.4 in 12/2010  . HTN (hypertension)    Echo 01/06/11: EF 65%  . Hyperlipidemia   . Obesity   . Stroke [facility]) 03/06/2016    PAST SURGICAL HISTORY: Past Surgical History:  Procedure Laterality Date  . ABDOMINAL HYSTERECTOMY    . APPENDECTOMY    . CORONARY ARTERY BYPASS GRAFT  01/12/2011   Procedure: CORONARY ARTERY BYPASS GRAFTING (CABG);  Surgeon: Alleen Borne, MD;  Location: [facility] OR;  Service: Open Heart Surgery;  Laterality: N/A;  coronary artery bypass graft times one using left internal mammary artery . Attempted endoscopic saphenous vein harvest  . LEFT HEART CATHETERIZATION WITH CORONARY ANGIOGRAM N/A 01/06/2011   Procedure: LEFT HEART CATHETERIZATION WITH CORONARY ANGIOGRAM;  Surgeon: Rollene Rotunda, MD;  Location: [facility] CATH LAB;  Service: Cardiovascular;  Laterality: N/A;    FAMILY HISTORY: Family History  Problem Relation Age of Onset  . Coronary artery disease Unknown        No family history  . Heart disease Mother   . Heart disease Father   . Heart disease Maternal Grandmother     SOCIAL HISTORY: Social History   Social History  . Marital status: Single    Spouse name: N/A  . Number of children: 2  . Years of education: N/A   Occupational History  .  [employer]   Social History Main Topics  . Smoking status: Never Smoker  . Smokeless tobacco: Never Used  . Alcohol use Yes     Comment: Occasional  . Drug use: No  . Sexual activity: Not on file   Other Topics Concern  . Not on file   Social History Narrative  . No narrative on file     PHYSICAL EXAM  Vitals:   06/22/16 1418    BP: 137/74  Pulse: 66  Height: 5\' 7"  (1.702 m)   There is no height or weight on file to calculate BMI.  Generalized: Well developed, morbidly obese femalein no acute distress  Head: normocephalic and atraumatic,. Oropharynx benign  Neck: Supple, no carotid bruits  Cardiac: Regular rate rhythm, no murmur  Musculoskeletal: No deformity   Neurological examination   Mentation: Alert oriented to time, place, history taking. Attention span and concentration appropriate. Recent and remote memory intact.  Follows all commands speech and language fluent.   Cranial nerve II-XII: Fundoscopic exam reveals sharp disc margins.Pupils were equal round reactive to light extraocular movements were full, visual field were full on confrontational test. Facial sensation and strength were normal. hearing was intact to finger rubbing bilaterally. Uvula tongue midline. head turning and shoulder shrug were normal and symmetric.Tongue protrusion into cheek strength was normal. Motor: normal bulk and tone, full  strength in the BUE,  4/5 lower extremities Sensory: normal and symmetric to light touch, pinprick, and  Vibration,in the upper and lower extremities Coordination: finger-nose-finger, Intact without dysmetria unable to heel to shin due to obesity Reflexes: 1+ upper lower and symmetric, plantar responses were flexor bilaterally. Gait and Station: Rising up from seated position with push off, wide based stance,  Slow steady gait with walker, no difficulty with turns  DIAGNOSTIC DATA (LABS, IMAGING, TESTING) - I reviewed patient records, labs, notes, testing and imaging myself where available.  Lab Results  Component Value Date   WBC 6.0 03/31/2016   HGB 11.0 (L) 03/31/2016   HCT 35.8 (L) 03/31/2016   MCV 95.0 03/31/2016   PLT 197 03/31/2016      Component Value Date/Time   NA 139 03/31/2016 0501   K 4.2 03/31/2016 0501   CL 108 03/31/2016 0501   CO2 26 03/31/2016 0501   GLUCOSE 102 (H)  03/31/2016 0501   BUN 22 (H) 03/31/2016 0501   CREATININE 0.85 03/31/2016 0501   CREATININE 1.08 (H) 11/08/2015 1248   CALCIUM 9.1 03/31/2016 0501   PROT 6.5 03/11/2016 0600   ALBUMIN 3.2 (L) 03/11/2016 0600   AST 22 03/11/2016 0600   ALT 29 03/11/2016 0600   ALKPHOS 87 03/11/2016 0600   BILITOT 0.5 03/11/2016 0600   GFRNONAA >60 03/31/2016 0501   GFRAA >60 03/31/2016 0501   Lab Results  Component Value Date   CHOL 196 03/07/2016   HDL 47 03/07/2016   LDLCALC 137 (H) 03/07/2016   LDLDIRECT 148.7 06/01/2011   TRIG 58 03/07/2016   CHOLHDL 4.2 03/07/2016   Lab Results  Component Value Date   HGBA1C 6.3 (H) 03/07/2016       ASSESSMENT AND PLAN  66 y.o. year old female  has a past medical history of CAD (coronary artery disease); Diabetes mellitus; Glucose intolerance (impaired glucose tolerance); HTN (hypertension); Hyperlipidemia; Obesity; and Stroke (HCC) (03/06/2016). here for hospital follow-up  for left PICA occlusion.The patient is a current patient of Dr. Roda Shutters  who is out of the office today . This note is sent to the work in doctor.     PLAN: Stressed the importance of management of risk factors to prevent further stroke Continue Plavix for secondary stroke prevention stop Aspirin Maintain strict control of hypertension with blood pressure goal below 130/90, today's reading137/74 continue antihypertensive medications Control of diabetes with hemoglobin A1c below 6.5 followed by primary care most recent hemoglobin A1c6.3 Cholesterol with LDL cholesterol less than 70, followed by primary care,  most recent 137 continue statin drugs Lipitor Follow up with PCP for stroke risk management Exercise by walking, continue physical therapy  slowly increase to increase  endurance , eat healthy diet with whole grains,  fresh fruits and vegetables Use walker for safe ambulation Follow up 6 months Discussed risk for recurrent stroke/ TIA and answered additional questions This was a  prolonged visit requiring 45 minutes and medical decision making of high complexity with extensive review of history, hospital chart, counseling and answering questions Nilda Riggs, [facility], [facility], APRN  [facility] [address] [place], [place] [address] [phone]

## 2016-06-22 NOTE — Patient Instructions (Signed)
Stressed the importance of management of risk factors to prevent further stroke Continue Plavix for secondary stroke prevention stop Aspirin Maintain strict control of hypertension with blood pressure goal below 130/90, today's reading137/74 continue antihypertensive medications Control of diabetes with hemoglobin A1c below 6.5 followed by primary care most recent hemoglobin A1c6.3 Cholesterol with LDL cholesterol less than 70, followed by primary care,  most recent 137 continue statin drugsLipitor Follow up with PCP for stroke risk management Exercise by walking, HEP slowly increase , eat healthy diet with whole grains,  fresh fruits and vegetables Use walker for safe ambulation Follow up 6 months

## 2016-06-23 ENCOUNTER — Ambulatory Visit: Payer: Medicare Other | Admitting: Physical Therapy

## 2016-06-23 DIAGNOSIS — I639 Cerebral infarction, unspecified: Secondary | ICD-10-CM

## 2016-06-23 DIAGNOSIS — I6389 Other cerebral infarction: Secondary | ICD-10-CM

## 2016-06-23 DIAGNOSIS — M6281 Muscle weakness (generalized): Secondary | ICD-10-CM

## 2016-06-23 DIAGNOSIS — F411 Generalized anxiety disorder: Secondary | ICD-10-CM

## 2016-06-23 DIAGNOSIS — Z7409 Other reduced mobility: Secondary | ICD-10-CM

## 2016-06-23 DIAGNOSIS — R2689 Other abnormalities of gait and mobility: Secondary | ICD-10-CM

## 2016-06-23 NOTE — Therapy (Signed)
[place], [place], [address] Phone: [phone]   Fax:  [phone]  Physical Therapy Treatment  Patient Details  Name: Megan Atkinson MRN: [id number] Date of Birth: 1950/03/01 Referring Provider: Alysia Penna  Encounter Date: 06/23/2016      PT End of Session - 06/23/16 1154    Visit Number 17   Date for PT Re-Evaluation 07/05/16   PT Start Time 54   PT Stop Time 1145   PT Time Calculation (min) 45 min   Activity Tolerance Patient limited by fatigue;Patient tolerated treatment well   Behavior During Therapy [facility] for tasks assessed/performed      Past Medical History:  Diagnosis Date   CAD (coronary artery disease)    small NSTEMI 11/12: LHC 01/06/11:  LAD 90-95%, mD1 90%, pD2 50%,  pOM1 50%, RCA occluded,  EF > 65%;  CABG  12/12: L-LAD   Diabetes mellitus    Glucose intolerance (impaired glucose tolerance)    Hemoglobin A1c 6.4 in 12/2010   HTN (hypertension)    Echo 01/06/11: EF 65%   Hyperlipidemia    Obesity    Stroke ([place]) 03/06/2016    Past Surgical History:  Procedure Laterality Date   ABDOMINAL HYSTERECTOMY     APPENDECTOMY     CORONARY ARTERY BYPASS GRAFT  01/12/2011   Procedure: CORONARY ARTERY BYPASS GRAFTING (CABG);  Surgeon: Gaye Pollack, MD;  Location: [place];  Service: Open Heart Surgery;  Laterality: N/A;  coronary artery bypass graft times one using left internal mammary artery . Attempted endoscopic saphenous vein harvest   LEFT HEART CATHETERIZATION WITH CORONARY ANGIOGRAM N/A 01/06/2011   Procedure: LEFT HEART CATHETERIZATION WITH CORONARY ANGIOGRAM;  Surgeon: Minus Breeding, MD;  Location: [facility] CATH LAB;  Service: Cardiovascular;  Laterality: N/A;    There were no vitals filed for this visit.      Subjective Assessment - 06/23/16 1153    Subjective Doing well today.  States she has been working on up/ down stairs at home.     Currently in  Pain? No/denies                         [facility] Adult PT Treatment/Exercise - 06/23/16 0001      Lumbar Exercises: Aerobic   Stationary Bike NuStep L3 x8 min   UBE (Upper Arm Bike) L1 x3 min      Lumbar Exercises: Standing   Row Both;20 reps;Theraband   Theraband Level (Row) Other (comment)  black     Knee/Hip Exercises: Standing   Forward Step Up Both;2 sets;10 reps;Step Height: 4";5 reps;Step Height: 6"   Forward Step Up Limitations standing at [facility] 4"     Knee/Hip Exercises: Seated   Long Arc Quad Both;2 sets;10 reps  3#Right , 0# Left   Marching Limitations 2x10    Marching Weights 3 lbs.   Hamstring Curl 15 reps;2 sets   Hamstring Limitations blue tband    Sit to Sand 1 set;10 reps                  PT Short Term Goals - 06/09/16 1021      PT SHORT TERM GOAL #1   Title pt will perform Berg balance test for balance screen   Status On-going           PT Long Term Goals - 06/21/16 1158      PT LONG TERM GOAL #1  Title Pt will improve 45mn walk to 500 feet with LRAD   Status On-going     PT LONG TERM GOAL #2   Title Pt will perform sit to stand x 5 in 45seconds   Status Partially Met     PT LONG TERM GOAL #3   Title pt will report the ability to walk up the stairs at home carrying an object   Status Partially Met     PT LONG TERM GOAL #4   Title pt will report return to work readiness   Status Partially Met               Plan - 06/23/16 1Buckheadmod-max encouragement for several exercises. Demos decreased Left vs Right quad strength with LAQ. Did well with fwd step up using BATCA machine for ue support.  Able to perform 6 reps of Sit<-> stand without UE assisst.      Patient will benefit from skilled therapeutic intervention in order to improve the following deficits and impairments:     Visit Diagnosis: Decreased functional mobility and endurance  Cerebellar stroke, acute  (HCC)  Muscle weakness (generalized)  Other abnormalities of gait and mobility  Generalized anxiety disorder  Anxiety state  Cerebrovascular accident (CVA) due to other mechanism ([place]  Cerebellar stroke ([facility]     Problem List Patient Active Problem List   Diagnosis Date Noted   Acute blood loss anemia    Elevated serum creatinine    Slow transit constipation    Benign essential HTN    Generalized anxiety disorder    Gait disturbance, post-stroke 03/13/2016   Cerebellar stroke, acute ([place] 03/10/2016   Super obese    Prediabetes    Ataxia, post-stroke    Acute cystitis without hematuria    CVA (cerebral vascular accident) ([place] 03/06/2016   Morbid obesity ([place] 02/29/2016   OSA (obstructive sleep apnea) 11/24/2014   Dyspnea 10/02/2014   Anemia 04/05/2011   Urinary tract infection, site not specified 04/05/2011   Diabetes mellitus ([place] 02/22/2011   Hyperlipidemia 02/22/2011   CAD (coronary artery disease) 01/07/2011   Hypertension 01/07/2011    LOlean Ree PTA 06/23/2016, 11:56 AM  [place] [address]Phone: [phone]  Fax:  [phone] Name: Megan LamartinaMRN: [id number] of Birth: [birth year]

## 2016-06-26 ENCOUNTER — Ambulatory Visit: Payer: Medicare Other | Admitting: Physical Therapy

## 2016-06-26 ENCOUNTER — Encounter: Payer: Self-pay | Admitting: Physical Therapy

## 2016-06-26 DIAGNOSIS — I639 Cerebral infarction, unspecified: Secondary | ICD-10-CM

## 2016-06-26 DIAGNOSIS — R2689 Other abnormalities of gait and mobility: Secondary | ICD-10-CM | POA: Diagnosis not present

## 2016-06-26 DIAGNOSIS — M6281 Muscle weakness (generalized): Secondary | ICD-10-CM

## 2016-06-26 DIAGNOSIS — Z7409 Other reduced mobility: Secondary | ICD-10-CM

## 2016-06-26 NOTE — Therapy (Signed)
[place], [place], [address] Phone: [phone]   Fax:  [phone]  Physical Therapy Treatment  Patient Details  Name: Megan Atkinson MRN: [id number] Date of Birth: 1950/06/14 Referring Provider: Alysia Penna  Encounter Date: 06/26/2016      PT End of Session - 06/26/16 1137    Visit Number 18   Date for PT Re-Evaluation 07/05/16   PT Start Time 1100   PT Stop Time 1144   PT Time Calculation (min) 44 min   Activity Tolerance Patient limited by fatigue;Patient tolerated treatment well   Behavior During Therapy [facility] for tasks assessed/performed      Past Medical History:  Diagnosis Date  . CAD (coronary artery disease)    small NSTEMI 11/12: LHC 01/06/11:  LAD 90-95%, mD1 90%, pD2 50%,  pOM1 50%, RCA occluded,  EF > 65%;  CABG  12/12: L-LAD  . Diabetes mellitus   . Glucose intolerance (impaired glucose tolerance)    Hemoglobin A1c 6.4 in 12/2010  . HTN (hypertension)    Echo 01/06/11: EF 65%  . Hyperlipidemia   . Obesity   . Stroke [facility]) 03/06/2016    Past Surgical History:  Procedure Laterality Date  . ABDOMINAL HYSTERECTOMY    . APPENDECTOMY    . CORONARY ARTERY BYPASS GRAFT  01/12/2011   Procedure: CORONARY ARTERY BYPASS GRAFTING (CABG);  Surgeon: Gaye Pollack, MD;  Location: [place];  Service: Open Heart Surgery;  Laterality: N/A;  coronary artery bypass graft times one using left internal mammary artery . Attempted endoscopic saphenous vein harvest  . LEFT HEART CATHETERIZATION WITH CORONARY ANGIOGRAM N/A 01/06/2011   Procedure: LEFT HEART CATHETERIZATION WITH CORONARY ANGIOGRAM;  Surgeon: Minus Breeding, MD;  [facility] CATH LAB;  Service: Cardiovascular;  Laterality: N/A;    There were no vitals filed for this visit.      Subjective Assessment - 06/26/16 1100    Subjective "Good"   Currently in Pain? No/denies   Pain Score 0-No pain                          [facility] Adult PT Treatment/Exercise - 06/26/16 0001      Lumbar Exercises: Aerobic   Stationary Bike NuStep L3 x8 min   UBE (Upper Arm Bike) L3 x4 min  3 min standing     Lumbar Exercises: Machines for Strengthening   Other Lumbar Machine Exercise Chest press 20lb 3x10      Lumbar Exercises: Standing   Row Both;Theraband;15 reps  x2   Theraband Level (Row) Other (comment)  Black      Knee/Hip Exercises: Standing   Forward Step Up Both;2 sets;10 reps;Step Height: 4";5 reps;Step Height: 6"   Forward Step Up Limitations standing at [facility] 4"     Knee/Hip Exercises: Seated   Marching Limitations 2x10    Marching Weights 3 lbs.   Hamstring Curl 15 reps;2 sets   Hamstring Limitations blue tband                   PT Short Term Goals - 06/09/16 1021      PT SHORT TERM GOAL #1   Title pt will perform Berg balance test for balance screen   Status On-going           PT Long Term Goals - 06/21/16 1158      PT LONG TERM GOAL #1   Title Pt will  improve 73mn walk to 500 feet with LRAD   Status On-going     PT LONG TERM GOAL #2   Title Pt will perform sit to stand x 5 in 45seconds   Status Partially Met     PT LONG TERM GOAL #3   Title pt will report the ability to walk up the stairs at home carrying an object   Status Partially Met     PT LONG TERM GOAL #4   Title pt will report return to work readiness   Status Partially Met               Plan - 06/26/16 1142    Clinical Impression Statement Continues to require max encouragement with activity. Unable to complete LAQ she said they were too hard today. Heavy use of UE to perform step ups. Express that she was ready to be done with therapy so she could begin water aerobics. Pt reports that doing them both at the same time would be too much om her.    Rehab Potential Fair   PT Duration 8 weeks   PT Treatment/Interventions ETeacher, adult educationtraining;Gait  training;Therapeutic activities;Therapeutic exercise;Neuromuscular re-education;Patient/family education;Passive range of motion   PT Next Visit Plan gait, endurance, strengthening of the LEs      Patient will benefit from skilled therapeutic intervention in order to improve the following deficits and impairments:  Decreased activity tolerance, Decreased strength, Difficulty walking, Decreased balance, Decreased endurance, Abnormal gait  Visit Diagnosis: Decreased functional mobility and endurance  Cerebellar stroke, acute (HCC)  Muscle weakness (generalized)     Problem List Patient Active Problem List   Diagnosis Date Noted  . Acute blood loss anemia   . Elevated serum creatinine   . Slow transit constipation   . Benign essential HTN   . Generalized anxiety disorder   . Gait disturbance, post-stroke 03/13/2016  . Cerebellar stroke, acute ([place] 03/10/2016  . Super obese   . Prediabetes   . Ataxia, post-stroke   . Acute cystitis without hematuria   . CVA (cerebral vascular accident) ([place] 03/06/2016  . Morbid obesity ([place] 02/29/2016  . OSA (obstructive sleep apnea) 11/24/2014  . Dyspnea 10/02/2014  . Anemia 04/05/2011  . Urinary tract infection, site not specified 04/05/2011  . Diabetes mellitus ([place] 02/22/2011  . Hyperlipidemia 02/22/2011  . CAD (coronary artery disease) 01/07/2011  . Hypertension 01/07/2011    RScot Jun PTA 06/26/2016, 11:45 AM  [place] [address]Phone: [phone]  Fax:  [phone] Name: Megan LambsonMRN: [id number] of Birth: 4Oct 01, 1952

## 2016-06-28 ENCOUNTER — Encounter: Payer: Self-pay | Admitting: Physical Therapy

## 2016-06-28 ENCOUNTER — Ambulatory Visit: Payer: Medicare Other | Admitting: Physical Therapy

## 2016-06-28 DIAGNOSIS — I639 Cerebral infarction, unspecified: Secondary | ICD-10-CM

## 2016-06-28 DIAGNOSIS — R2689 Other abnormalities of gait and mobility: Secondary | ICD-10-CM | POA: Diagnosis not present

## 2016-06-28 DIAGNOSIS — Z7409 Other reduced mobility: Secondary | ICD-10-CM

## 2016-06-28 DIAGNOSIS — M6281 Muscle weakness (generalized): Secondary | ICD-10-CM

## 2016-06-28 NOTE — Therapy (Signed)
[place], [place], [address] Phone: [phone]   Fax:  [phone]  Physical Therapy Treatment  Patient Details  Name: Megan Atkinson MRN: [id number] Date of Birth: Aug 01, 1950 Referring Provider: Alysia Penna  Encounter Date: 06/28/2016      PT End of Session - 06/28/16 1145    Visit Number 19   Date for PT Re-Evaluation 07/05/16   PT Start Time 1100   PT Stop Time 1147   PT Time Calculation (min) 47 min   Activity Tolerance Patient tolerated treatment well   Behavior During Therapy [facility] for tasks assessed/performed      Past Medical History:  Diagnosis Date  . CAD (coronary artery disease)    small NSTEMI 11/12: LHC 01/06/11:  LAD 90-95%, mD1 90%, pD2 50%,  pOM1 50%, RCA occluded,  EF > 65%;  CABG  12/12: L-LAD  . Diabetes mellitus   . Glucose intolerance (impaired glucose tolerance)    Hemoglobin A1c 6.4 in 12/2010  . HTN (hypertension)    Echo 01/06/11: EF 65%  . Hyperlipidemia   . Obesity   . Stroke [facility]) 03/06/2016    Past Surgical History:  Procedure Laterality Date  . ABDOMINAL HYSTERECTOMY    . APPENDECTOMY    . CORONARY ARTERY BYPASS GRAFT  01/12/2011   Procedure: CORONARY ARTERY BYPASS GRAFTING (CABG);  Surgeon: Gaye Pollack, MD;  Location: [place];  Service: Open Heart Surgery;  Laterality: N/A;  coronary artery bypass graft times one using left internal mammary artery . Attempted endoscopic saphenous vein harvest  . LEFT HEART CATHETERIZATION WITH CORONARY ANGIOGRAM N/A 01/06/2011   Procedure: LEFT HEART CATHETERIZATION WITH CORONARY ANGIOGRAM;  Surgeon: Minus Breeding, MD;  Location: [facility] CATH LAB;  Service: Cardiovascular;  Laterality: N/A;    There were no vitals filed for this visit.      Subjective Assessment - 06/28/16 1059    Subjective "OK"   Currently in Pain? No/denies   Pain Score 0-No pain                         [facility] Adult PT  Treatment/Exercise - 06/28/16 0001      Lumbar Exercises: Aerobic   Stationary Bike NuStep L3 x6 min   UBE (Upper Arm Bike) L5 x  3 min standing 3 sitting     Lumbar Exercises: Standing   Row Both;Theraband;15 reps  x2   Theraband Level (Row) Other (comment)  black   Other Standing Lumbar Exercises Standing march with RW 2x10     Knee/Hip Exercises: Standing   Forward Step Up Both;10 reps;Step Height: 6";1 set   Forward Step Up Limitations RW     Knee/Hip Exercises: Seated   Long Arc Quad Both;2 sets;15 reps   Long Arc Quad Weight 2 lbs.   Hamstring Curl 15 reps;2 sets   Hamstring Limitations black band   Sit to Sand 10 reps;2 sets;with UE support                  PT Short Term Goals - 06/09/16 1021      PT SHORT TERM GOAL #1   Title pt will perform Berg balance test for balance screen   Status On-going           PT Long Term Goals - 06/28/16 1145      PT LONG TERM GOAL #1   Title Pt will improve 60mn walk to 500  feet with LRAD   Status On-going     PT LONG TERM GOAL #2   Title Pt will perform sit to stand x 5 in 45seconds   Status Partially Met     PT LONG TERM GOAL #3   Title pt will report the ability to walk up the stairs at home carrying an object   Status Partially Met     PT LONG TERM GOAL #4   Title pt will report return to work readiness   Status Partially Met               Plan - 06/28/16 1146    Clinical Impression Statement Pt with a better tolerance to perform exercises today, no issues with today's activity. she continues to fatigue quick and require RW for ambulation.    Rehab Potential Fair   PT Duration 8 weeks   PT Treatment/Interventions Teacher, adult education training;Gait training;Therapeutic activities;Therapeutic exercise;Neuromuscular re-education;Patient/family education;Passive range of motion   PT Next Visit Plan gait, endurance, strengthening of the LEs      Patient will benefit from skilled  therapeutic intervention in order to improve the following deficits and impairments:  Decreased activity tolerance, Decreased strength, Difficulty walking, Decreased balance, Decreased endurance, Abnormal gait  Visit Diagnosis: Decreased functional mobility and endurance  Cerebellar stroke, acute (HCC)  Muscle weakness (generalized)     Problem List Patient Active Problem List   Diagnosis Date Noted  . Acute blood loss anemia   . Elevated serum creatinine   . Slow transit constipation   . Benign essential HTN   . Generalized anxiety disorder   . Gait disturbance, post-stroke 03/13/2016  . Cerebellar stroke, acute ([place]) 03/10/2016  . Super obese   . Prediabetes   . Ataxia, post-stroke   . Acute cystitis without hematuria   . CVA (cerebral vascular accident) ([place]) 03/06/2016  . Morbid obesity ([place]) 02/29/2016  . OSA (obstructive sleep apnea) 11/24/2014  . Dyspnea 10/02/2014  . Anemia 04/05/2011  . Urinary tract infection, site not specified 04/05/2011  . Diabetes mellitus ([place]) 02/22/2011  . Hyperlipidemia 02/22/2011  . CAD (coronary artery disease) 01/07/2011  . Hypertension 01/07/2011    Scot Jun, PTA 06/28/2016, 11:52 AM  [place], [place], [address] Phone: [phone]   Fax:  [phone]  Name: Briannia Laba MRN: [id number] Date of Birth: [birth year]

## 2016-06-30 ENCOUNTER — Ambulatory Visit: Payer: Medicare Other | Admitting: Physical Therapy

## 2016-06-30 ENCOUNTER — Encounter: Payer: Self-pay | Admitting: Physical Therapy

## 2016-06-30 DIAGNOSIS — Z7409 Other reduced mobility: Secondary | ICD-10-CM

## 2016-06-30 DIAGNOSIS — M6281 Muscle weakness (generalized): Secondary | ICD-10-CM

## 2016-06-30 DIAGNOSIS — R2689 Other abnormalities of gait and mobility: Secondary | ICD-10-CM | POA: Diagnosis not present

## 2016-06-30 DIAGNOSIS — I639 Cerebral infarction, unspecified: Secondary | ICD-10-CM

## 2016-06-30 NOTE — Therapy (Signed)
[place], [place], [address] Phone: [phone]   Fax:  [phone]  Physical Therapy Treatment  Patient Details  Name: Megan Atkinson MRN: [id number] Date of Birth: Aug 21, 1950 Referring Provider: Alysia Penna  Encounter Date: 06/30/2016      PT End of Session - 06/30/16 1144    Visit Number 20   Date for PT Re-Evaluation 07/05/16   PT Start Time 1100   PT Stop Time 1145   PT Time Calculation (min) 45 min   Activity Tolerance Patient tolerated treatment well   Behavior During Therapy [facility] for tasks assessed/performed      Past Medical History:  Diagnosis Date  . CAD (coronary artery disease)    small NSTEMI 11/12: LHC 01/06/11:  LAD 90-95%, mD1 90%, pD2 50%,  pOM1 50%, RCA occluded,  EF > 65%;  CABG  12/12: L-LAD  . Diabetes mellitus   . Glucose intolerance (impaired glucose tolerance)    Hemoglobin A1c 6.4 in 12/2010  . HTN (hypertension)    Echo 01/06/11: EF 65%  . Hyperlipidemia   . Obesity   . Stroke [facility]) 03/06/2016    Past Surgical History:  Procedure Laterality Date  . ABDOMINAL HYSTERECTOMY    . APPENDECTOMY    . CORONARY ARTERY BYPASS GRAFT  01/12/2011   Procedure: CORONARY ARTERY BYPASS GRAFTING (CABG);  Surgeon: Gaye Pollack, MD;  Location: [place];  Service: Open Heart Surgery;  Laterality: N/A;  coronary artery bypass graft times one using left internal mammary artery . Attempted endoscopic saphenous vein harvest  . LEFT HEART CATHETERIZATION WITH CORONARY ANGIOGRAM N/A 01/06/2011   Procedure: LEFT HEART CATHETERIZATION WITH CORONARY ANGIOGRAM;  Surgeon: Minus Breeding, MD;  [facility];  Service: Cardiovascular;  Laterality: N/A;    There were no vitals filed for this visit.      Subjective Assessment - 06/30/16 1103    Subjective "I feel all right"   Currently in Pain? No/denies   Pain Score 0-No pain                          [facility] Adult PT Treatment/Exercise - 06/30/16 0001      Ambulation/Gait   Ambulation/Gait Yes   Ambulation Distance (Feet) 40 Feet   Assistive device Straight cane   Gait Pattern Shuffle;Poor foot clearance - right;Abducted- right;Right circumduction   Ambulation Surface Level;Indoor   Gait Comments took ~ 5 minutes     Lumbar Exercises: Aerobic   Stationary Bike NuStep L3 x7 min   UBE (Upper Arm Bike) L5 x  3 min standing 3 sitting     Lumbar Exercises: Standing   Row Both;Theraband;20 reps  x2   Theraband Level (Row) Level 4 (Blue)  black   Other Standing Lumbar Exercises Standing march with RW 2x10     Knee/Hip Exercises: Standing   Forward Step Up Both;Step Height: 6";1 set;5 reps   Forward Step Up Limitations RW                  PT Short Term Goals - 06/09/16 1021      PT SHORT TERM GOAL #1   Title pt will perform Berg balance test for balance screen   Status On-going           PT Long Term Goals - 06/28/16 1145      PT LONG TERM GOAL #1   Title Pt will improve  79mn walk to 500 feet with LRAD   Status On-going     PT LONG TERM GOAL #2   Title Pt will perform sit to stand x 5 in 45seconds   Status Partially Met     PT LONG TERM GOAL #3   Title pt will report the ability to walk up the stairs at home carrying an object   Status Partially Met     PT LONG TERM GOAL #4   Title pt will report return to work readiness   Status Partially Met               Plan - 06/30/16 1146    Clinical Impression Statement Pt expressed that she would like to return to work, but she could only return to work if she could ambulate with [place] Attempted SPC gait trial pt able to complete with increase time needed. Demos good postural stability with standing rows with black Tband. requires max encouragement to perform step up.   Rehab Potential Fair   PT Duration 8 weeks   PT Treatment/Interventions ETeacher, adult educationtraining;Gait training;Therapeutic  activities;Therapeutic exercise;Neuromuscular re-education;Patient/family education;Passive range of motion   PT Next Visit Plan gait, endurance, strengthening of the LEs      Patient will benefit from skilled therapeutic intervention in order to improve the following deficits and impairments:  Decreased activity tolerance, Decreased strength, Difficulty walking, Decreased balance, Decreased endurance, Abnormal gait  Visit Diagnosis: Decreased functional mobility and endurance  Cerebellar stroke, acute (HCC)  Muscle weakness (generalized)     Problem List Patient Active Problem List   Diagnosis Date Noted  . Acute blood loss anemia   . Elevated serum creatinine   . Slow transit constipation   . Benign essential HTN   . Generalized anxiety disorder   . Gait disturbance, post-stroke 03/13/2016  . Cerebellar stroke, acute ([place] 03/10/2016  . Super obese   . Prediabetes   . Ataxia, post-stroke   . Acute cystitis without hematuria   . CVA (cerebral vascular accident) ([place] 03/06/2016  . Morbid obesity ([place] 02/29/2016  . OSA (obstructive sleep apnea) 11/24/2014  . Dyspnea 10/02/2014  . Anemia 04/05/2011  . Urinary tract infection, site not specified 04/05/2011  . Diabetes mellitus ([place] 02/22/2011  . Hyperlipidemia 02/22/2011  . CAD (coronary artery disease) 01/07/2011  . Hypertension 01/07/2011    RScot Jun PTA 06/30/2016, 11:48 AM  [place] [address]Phone: [phone]  Fax:  [phone] Name: Megan DegnerMRN: [id number] of Birth: 421-Jan-1952

## 2016-07-05 ENCOUNTER — Ambulatory Visit: Payer: Medicare Other | Admitting: Physical Therapy

## 2016-07-05 ENCOUNTER — Encounter: Payer: Self-pay | Admitting: Physical Therapy

## 2016-07-05 DIAGNOSIS — M6281 Muscle weakness (generalized): Secondary | ICD-10-CM

## 2016-07-05 DIAGNOSIS — I639 Cerebral infarction, unspecified: Secondary | ICD-10-CM

## 2016-07-05 DIAGNOSIS — R2689 Other abnormalities of gait and mobility: Secondary | ICD-10-CM | POA: Diagnosis not present

## 2016-07-05 DIAGNOSIS — Z7409 Other reduced mobility: Secondary | ICD-10-CM

## 2016-07-05 NOTE — Addendum Note (Signed)
Addended by: Jearld LeschALBRIGHT, Kennett Symes W on: 07/05/2016 01:03 PM   Modules accepted: Orders

## 2016-07-05 NOTE — Therapy (Signed)
[place], [place], [address] Phone: [phone]   Fax:  [phone]  Physical Therapy Treatment  Patient Details  Name: Megan Atkinson MRN: [id number] Date of Birth: [birth year] Referring Provider: Alysia Penna  Encounter Date: 07/05/2016      PT End of Session - 07/05/16 1144    Visit Number 21   Date for PT Re-Evaluation 07/05/16   PT Start Time 1100   PT Stop Time 1145   PT Time Calculation (min) 45 min   Activity Tolerance Patient tolerated treatment well   Behavior During Therapy [facility] for tasks assessed/performed      Past Medical History:  Diagnosis Date  . CAD (coronary artery disease)    small NSTEMI 11/12: LHC 01/06/11:  LAD 90-95%, mD1 90%, pD2 50%,  pOM1 50%, RCA occluded,  EF > 65%;  CABG  12/12: L-LAD  . Diabetes mellitus   . Glucose intolerance (impaired glucose tolerance)    Hemoglobin A1c 6.4 in 12/2010  . HTN (hypertension)    Echo 01/06/11: EF 65%  . Hyperlipidemia   . Obesity   . Stroke [facility]) 03/06/2016    Past Surgical History:  Procedure Laterality Date  . ABDOMINAL HYSTERECTOMY    . APPENDECTOMY    . CORONARY ARTERY BYPASS GRAFT  01/12/2011   Procedure: CORONARY ARTERY BYPASS GRAFTING (CABG);  Surgeon: Gaye Pollack, MD;  Location: [place];  Service: Open Heart Surgery;  Laterality: N/A;  coronary artery bypass graft times one using left internal mammary artery . Attempted endoscopic saphenous vein harvest  . LEFT HEART CATHETERIZATION WITH CORONARY ANGIOGRAM N/A 01/06/2011   Procedure: LEFT HEART CATHETERIZATION WITH CORONARY ANGIOGRAM;  Surgeon: Minus Breeding, MD;  Location: [facility] C[facility] LAB;  Service: Cardiovascular;  Laterality: N/A;    There were no vitals filed for this visit.      Subjective Assessment - 07/05/16 1100    Subjective "it is going all right"   Currently in Pain? No/denies   Pain Score 0-No pain                         [facility] Adult PT Treatment/Exercise - 07/05/16 0001      Ambulation/Gait   Ambulation/Gait Yes   Ambulation/Gait Assistance 6: Modified independent (Device/Increase time);5: Supervision   Ambulation Distance (Feet) 40 Feet   Assistive device Straight cane   Gait Pattern Shuffle;Poor foot clearance - right;Decreased arm swing - left;Decreased step length - right;Decreased step length - left;Decreased hip/knee flexion - right;Decreased hip/knee flexion - left;Step-through pattern   Ambulation Surface Level;Indoor   Gait Comments 4 minutes     Lumbar Exercises: Aerobic   Stationary Bike NuStep L3 x7 min   UBE (Upper Arm Bike) L5 x  3 min standing 3 sitting     Lumbar Exercises: Standing   Row Both;Theraband;20 reps  x2   Theraband Level (Row) Other (comment)  Level 5 black    Other Standing Lumbar Exercises Standing march with RW 2x10     Lumbar Exercises: Supine   Other Supine Lumbar Exercises Seated OHP with yellow ball 2x10     Knee/Hip Exercises: Seated   Long Arc Quad Both;2 sets;15 reps   Long Arc Quad Weight 3 lbs.                  PT Short Term Goals - 07/05/16 1145      PT SHORT TERM GOAL #1  Title pt will perform Berg balance test for balance screen   Status On-going           PT Long Term Goals - 07/05/16 1145      PT LONG TERM GOAL #1   Title Pt will improve 41mn walk to 500 feet with LRAD   Status On-going     PT LONG TERM GOAL #2   Title Pt will perform sit to stand x 5 in 45seconds   Status Partially Met     PT LONG TERM GOAL #4   Title pt will report return to work readiness   Status Partially Met               Plan - 07/05/16 1145    Clinical Impression Statement Pt able to do all of today's interventions, fatigues quick and needs multiple rest breaks. Pt able to ambulate with SPC but increase energy required, pt needed 3 standing rest breaks to complete gait trial.    Rehab Potential Fair   PT Duration 8 weeks   PT  Treatment/Interventions Electrical Stimulation;Stair training;Gait training;Therapeutic activities;Therapeutic exercise;Neuromuscular re-education;Patient/family education;Passive range of motion   PT Next Visit Plan gait, endurance, strengthening of the LEs      Patient will benefit from skilled therapeutic intervention in order to improve the following deficits and impairments:  Decreased activity tolerance, Decreased strength, Difficulty walking, Decreased balance, Decreased endurance, Abnormal gait  Visit Diagnosis: Decreased functional mobility and endurance  Cerebellar stroke, acute (HCC)  Muscle weakness (generalized)     Problem List Patient Active Problem List   Diagnosis Date Noted  . Acute blood loss anemia   . Elevated serum creatinine   . Slow transit constipation   . Benign essential HTN   . Generalized anxiety disorder   . Gait disturbance, post-stroke 03/13/2016  . Cerebellar stroke, acute ([place] 03/10/2016  . Super obese   . Prediabetes   . Ataxia, post-stroke   . Acute cystitis without hematuria   . CVA (cerebral vascular accident) ([place] 03/06/2016  . Morbid obesity ([place] 02/29/2016  . OSA (obstructive sleep apnea) 11/24/2014  . Dyspnea 10/02/2014  . Anemia 04/05/2011  . Urinary tract infection, site not specified 04/05/2011  . Diabetes mellitus ([facility] 02/22/2011  . Hyperlipidemia 02/22/2011  . CAD (coronary artery disease) 01/07/2011  . Hypertension 01/07/2011    RScot Jun PTA 07/05/2016, 11:48 AM  [place] [address]Phone: [phone]  Fax:  [phone] Name: Megan HoltzclawMRN: [id number] of Birth: [birth year]

## 2016-07-07 ENCOUNTER — Encounter: Payer: Self-pay | Admitting: Physical Therapy

## 2016-07-07 ENCOUNTER — Ambulatory Visit: Payer: Medicare Other | Attending: Physical Medicine & Rehabilitation | Admitting: Physical Therapy

## 2016-07-07 DIAGNOSIS — M6281 Muscle weakness (generalized): Secondary | ICD-10-CM | POA: Diagnosis not present

## 2016-07-07 DIAGNOSIS — R2689 Other abnormalities of gait and mobility: Secondary | ICD-10-CM | POA: Diagnosis not present

## 2016-07-07 DIAGNOSIS — Z7409 Other reduced mobility: Secondary | ICD-10-CM | POA: Diagnosis not present

## 2016-07-07 DIAGNOSIS — I639 Cerebral infarction, unspecified: Secondary | ICD-10-CM | POA: Diagnosis not present

## 2016-07-07 NOTE — Therapy (Signed)
[place], [place], [address] Phone: [phone]   Fax:  [phone]  Physical Therapy Treatment  Patient Details  Name: Megan Atkinson MRN: [id number] Date of Birth: 12/14/1950 Referring Provider: Alysia Penna  Encounter Date: 07/07/2016      PT End of Session - 07/07/16 1137    Visit Number 22   Date for PT Re-Evaluation 08/05/16   PT Start Time 1050   PT Stop Time 1140   PT Time Calculation (min) 50 min      Past Medical History:  Diagnosis Date  . CAD (coronary artery disease)    small NSTEMI 11/12: LHC 01/06/11:  LAD 90-95%, mD1 90%, pD2 50%,  pOM1 50%, RCA occluded,  EF > 65%;  CABG  12/12: L-LAD  . Diabetes mellitus   . Glucose intolerance (impaired glucose tolerance)    Hemoglobin A1c 6.4 in 12/2010  . HTN (hypertension)    Echo 01/06/11: EF 65%  . Hyperlipidemia   . Obesity   . Stroke [facility]) 03/06/2016    Past Surgical History:  Procedure Laterality Date  . ABDOMINAL HYSTERECTOMY    . APPENDECTOMY    . CORONARY ARTERY BYPASS GRAFT  01/12/2011   Procedure: CORONARY ARTERY BYPASS GRAFTING (CABG);  Surgeon: Gaye Pollack, MD;  Location: [place];  Service: Open Heart Surgery;  Laterality: N/A;  coronary artery bypass graft times one using left internal mammary artery . Attempted endoscopic saphenous vein harvest  . LEFT HEART CATHETERIZATION WITH CORONARY ANGIOGRAM N/A 01/06/2011   Procedure: LEFT HEART CATHETERIZATION WITH CORONARY ANGIOGRAM;  Surgeon: Minus Breeding, MD;  Location: [facility] CATH LAB;  Service: Cardiovascular;  Laterality: N/A;    There were no vitals filed for this visit.      Subjective Assessment - 07/07/16 1104    Subjective ready to be donw but okay to come until sees MD 07/24/16   Currently in Pain? No/denies                         [facility] Adult PT Treatment/Exercise - 07/07/16 0001      Ambulation/Gait   Gait Comments HHA from car to  clinic HHA with encouragement to go faster and not rest. increased step thru and stride     Knee/Hip Exercises: Aerobic   Elliptical 4 min I 5 R 4     Knee/Hip Exercises: Standing   Walking with Sports Cord blue tband fwd and SW   Other Standing Knee Exercises HHA recipicol mvmt   Other Standing Knee Exercises HHA alt step touch     Knee/Hip Exercises: Seated   Sit to Sand 2 sets;10 reps;with UE support                  PT Short Term Goals - 07/05/16 1145      PT SHORT TERM GOAL #1   Title pt will perform Berg balance test for balance screen   Status On-going           PT Long Term Goals - 07/07/16 1137      PT LONG TERM GOAL #1   Title Pt will improve 59mn walk to 500 feet with LRAD   Status On-going     PT LONG TERM GOAL #2   Title Pt will perform sit to stand x 5 in 45seconds   Status Partially Met     PT LONG TERM GOAL #3   Title pt  will report the ability to walk up the stairs at home carrying an object   Status Partially Met     PT LONG TERM GOAL #4   Title pt will report return to work readiness   Status On-going               Plan - 07/07/16 1138    Clinical Impression Statement focus session today on recipricol LE mvmts. cuing and encouragement needed   PT Next Visit Plan BERG, func gait and mvmts      Patient will benefit from skilled therapeutic intervention in order to improve the following deficits and impairments:     Visit Diagnosis: Decreased functional mobility and endurance  Cerebellar stroke, acute (HCC)  Muscle weakness (generalized)  Other abnormalities of gait and mobility     Problem List Patient Active Problem List   Diagnosis Date Noted  . Acute blood loss anemia   . Elevated serum creatinine   . Slow transit constipation   . Benign essential HTN   . Generalized anxiety disorder   . Gait disturbance, post-stroke 03/13/2016  . Cerebellar stroke, acute ([place]) 03/10/2016  . Super obese   . Prediabetes    . Ataxia, post-stroke   . Acute cystitis without hematuria   . CVA (cerebral vascular accident) ([place]) 03/06/2016  . Morbid obesity ([place]) 02/29/2016  . OSA (obstructive sleep apnea) 11/24/2014  . Dyspnea 10/02/2014  . Anemia 04/05/2011  . Urinary tract infection, site not specified 04/05/2011  . Diabetes mellitus ([place]) 02/22/2011  . Hyperlipidemia 02/22/2011  . CAD (coronary artery disease) 01/07/2011  . Hypertension 01/07/2011    PAYSEUR,ANGIE  PTA 07/07/2016, 11:39 AM  [place], [place], [address] Phone: [phone]   Fax:  [phone]  Name: Megan Atkinson MRN: [id number] Date of Birth: [birth year]

## 2016-07-08 DIAGNOSIS — I259 Chronic ischemic heart disease, unspecified: Secondary | ICD-10-CM | POA: Diagnosis not present

## 2016-07-08 DIAGNOSIS — I10 Essential (primary) hypertension: Secondary | ICD-10-CM | POA: Diagnosis not present

## 2016-07-08 DIAGNOSIS — I639 Cerebral infarction, unspecified: Secondary | ICD-10-CM | POA: Diagnosis not present

## 2016-07-08 DIAGNOSIS — G4733 Obstructive sleep apnea (adult) (pediatric): Secondary | ICD-10-CM | POA: Diagnosis not present

## 2016-07-10 ENCOUNTER — Ambulatory Visit: Payer: Medicare Other | Admitting: Physical Therapy

## 2016-07-10 ENCOUNTER — Encounter: Payer: Self-pay | Admitting: Physical Therapy

## 2016-07-10 DIAGNOSIS — M6281 Muscle weakness (generalized): Secondary | ICD-10-CM

## 2016-07-10 DIAGNOSIS — I639 Cerebral infarction, unspecified: Secondary | ICD-10-CM

## 2016-07-10 DIAGNOSIS — Z7409 Other reduced mobility: Secondary | ICD-10-CM | POA: Diagnosis not present

## 2016-07-10 NOTE — Therapy (Signed)
[place], [place], [address] Phone: [phone]   Fax:  [phone]  Physical Therapy Treatment  Patient Details  Name: Megan Atkinson MRN: [id number] Date of Birth: Mar 12, 1950 Referring Provider: Alysia Penna  Encounter Date: 07/10/2016      PT End of Session - 07/10/16 1344    Visit Number 23   Date for PT Re-Evaluation 08/05/16   PT Start Time 1252   PT Stop Time 1338   PT Time Calculation (min) 46 min   Activity Tolerance Patient tolerated treatment well   Behavior During Therapy [facility] for tasks assessed/performed      Past Medical History:  Diagnosis Date  . CAD (coronary artery disease)    small NSTEMI 11/12: LHC 01/06/11:  LAD 90-95%, mD1 90%, pD2 50%,  pOM1 50%, RCA occluded,  EF > 65%;  CABG  12/12: L-LAD  . Diabetes mellitus   . Glucose intolerance (impaired glucose tolerance)    Hemoglobin A1c 6.4 in 12/2010  . HTN (hypertension)    Echo 01/06/11: EF 65%  . Hyperlipidemia   . Obesity   . Stroke [facility]) 03/06/2016    Past Surgical History:  Procedure Laterality Date  . ABDOMINAL HYSTERECTOMY    . APPENDECTOMY    . CORONARY ARTERY BYPASS GRAFT  01/12/2011   Procedure: CORONARY ARTERY BYPASS GRAFTING (CABG);  Surgeon: Gaye Pollack, MD;  Location: [place];  Service: Open Heart Surgery;  Laterality: N/A;  coronary artery bypass graft times one using left internal mammary artery . Attempted endoscopic saphenous vein harvest  . LEFT HEART CATHETERIZATION WITH CORONARY ANGIOGRAM N/A 01/06/2011   Procedure: LEFT HEART CATHETERIZATION WITH CORONARY ANGIOGRAM;  Surgeon: Minus Breeding, MD;  Location[facility] CATH LAB;  Service: Cardiovascular;  Laterality: N/A;    There were no vitals filed for this visit.      Subjective Assessment - 07/10/16 1310    Subjective Im all right   Currently in Pain? No/denies   Pain Score 0-No pain                         [facility]  Adult PT Treatment/Exercise - 07/10/16 0001      Ambulation/Gait   Gait Comments HHA from car to clinic HHA with encouragement to go faster and not rest. increased step thru and stride     Lumbar Exercises: Standing   Other Standing Lumbar Exercises Seated OHP 3lb 2x10      Knee/Hip Exercises: Aerobic   Elliptical 4 min I 5 R 4     Knee/Hip Exercises: Standing   Other Standing Knee Exercises HHA recipicol mvmt   Other Standing Knee Exercises HHA alt step touch     Knee/Hip Exercises: Seated   Sit to Sand 2 sets;10 reps;with UE support                  PT Short Term Goals - 07/10/16 1345      PT SHORT TERM GOAL #1   Title pt will perform Berg balance test for balance screen   Status On-going           PT Long Term Goals - 07/10/16 1345      PT LONG TERM GOAL #1   Title Pt will improve 80mn walk to 500 feet with LRAD   Status On-going     PT LONG TERM GOAL #2   Title Pt will perform sit to stand x  5 in 45seconds   Status Partially Met     PT LONG TERM GOAL #4   Title pt will report return to work readiness   Status On-going               Plan - 07/10/16 1345    Clinical Impression Statement Max encouragement needed to keep pt moving and not taking too many rest breaks throughout session. Some uncoordinated with reciprocal movements. Three standing rest breaks needed walking HHA from car to clinic.   Rehab Potential Fair   PT Duration 8 weeks   PT Treatment/Interventions Teacher, adult education training;Gait training;Therapeutic activities;Therapeutic exercise;Neuromuscular re-education;Patient/family education;Passive range of motion   PT Next Visit Plan BERG, func gait and mvmts      Patient will benefit from skilled therapeutic intervention in order to improve the following deficits and impairments:  Decreased activity tolerance, Decreased strength, Difficulty walking, Decreased balance, Decreased endurance, Abnormal gait  Visit  Diagnosis: Decreased functional mobility and endurance  Cerebellar stroke, acute (HCC)  Muscle weakness (generalized)     Problem List Patient Active Problem List   Diagnosis Date Noted  . Acute blood loss anemia   . Elevated serum creatinine   . Slow transit constipation   . Benign essential HTN   . Generalized anxiety disorder   . Gait disturbance, post-stroke 03/13/2016  . Cerebellar stroke, acute ([place]) 03/10/2016  . Super obese   . Prediabetes   . Ataxia, post-stroke   . Acute cystitis without hematuria   . CVA (cerebral vascular accident) ([place]) 03/06/2016  . Morbid obesity ([place]) 02/29/2016  . OSA (obstructive sleep apnea) 11/24/2014  . Dyspnea 10/02/2014  . Anemia 04/05/2011  . Urinary tract infection, site not specified 04/05/2011  . Diabetes mellitus ([place]) 02/22/2011  . Hyperlipidemia 02/22/2011  . CAD (coronary artery disease) 01/07/2011  . Hypertension 01/07/2011    Scot Jun, PTA 07/10/2016, 1:48 PM  [place], [place], [address] Phone: [phone]   Fax:  [phone]  Name: Oksana Deberry MRN: [id number] Date of Birth: Mar 17, 1950

## 2016-07-13 ENCOUNTER — Encounter: Payer: Self-pay | Admitting: Physical Therapy

## 2016-07-13 ENCOUNTER — Ambulatory Visit: Payer: Medicare Other | Admitting: Physical Therapy

## 2016-07-13 DIAGNOSIS — Z7409 Other reduced mobility: Secondary | ICD-10-CM | POA: Diagnosis not present

## 2016-07-13 DIAGNOSIS — I639 Cerebral infarction, unspecified: Secondary | ICD-10-CM

## 2016-07-13 DIAGNOSIS — R2689 Other abnormalities of gait and mobility: Secondary | ICD-10-CM

## 2016-07-13 DIAGNOSIS — M6281 Muscle weakness (generalized): Secondary | ICD-10-CM

## 2016-07-13 NOTE — Therapy (Signed)
[place], [place], [address] Phone: [phone]   Fax:  [phone]  Physical Therapy Treatment  Patient Details  Name: Megan Atkinson MRN: [id number] Date of Birth: 1950-08-12 Referring Provider: Alysia Penna  Encounter Date: 07/13/2016      PT End of Session - 07/13/16 1646    Visit Number 24   Date for PT Re-Evaluation 08/05/16   PT Start Time 1615   PT Stop Time 1705   PT Time Calculation (min) 50 min      Past Medical History:  Diagnosis Date  . CAD (coronary artery disease)    small NSTEMI 11/12: LHC 01/06/11:  LAD 90-95%, mD1 90%, pD2 50%,  pOM1 50%, RCA occluded,  EF > 65%;  CABG  12/12: L-LAD  . Diabetes mellitus   . Glucose intolerance (impaired glucose tolerance)    Hemoglobin A1c 6.4 in 12/2010  . HTN (hypertension)    Echo 01/06/11: EF 65%  . Hyperlipidemia   . Obesity   . Stroke [facility]) 03/06/2016    Past Surgical History:  Procedure Laterality Date  . ABDOMINAL HYSTERECTOMY    . APPENDECTOMY    . CORONARY ARTERY BYPASS GRAFT  01/12/2011   Procedure: CORONARY ARTERY BYPASS GRAFTING (CABG);  Surgeon: Gaye Pollack, MD;  Location: [place];  Service: Open Heart Surgery;  Laterality: N/A;  coronary artery bypass graft times one using left internal mammary artery . Attempted endoscopic saphenous vein harvest  . LEFT HEART CATHETERIZATION WITH CORONARY ANGIOGRAM N/A 01/06/2011   Procedure: LEFT HEART CATHETERIZATION WITH CORONARY ANGIOGRAM;  Surgeon: Minus Breeding, MD;  Location: [facility] CATH LAB;  Service: Cardiovascular;  Laterality: N/A;    There were no vitals filed for this visit.      Subjective Assessment - 07/13/16 1619    Subjective i get tired easy   Currently in Pain? No/denies                         [facility] Adult PT Treatment/Exercise - 07/13/16 0001      Ambulation/Gait   Gait Comments amb with SPC around room picking up obj.  amb with  recipricol UE mvmt with PTA assist- LOB 1 time     Berg Balance Test   Sit to Stand Able to stand without using hands and stabilize independently   Standing Unsupported Able to stand safely 2 minutes   Sitting with Back Unsupported but Feet Supported on Floor or Stool Able to sit safely and securely 2 minutes   Stand to Sit Sits safely with minimal use of hands   Transfers Able to transfer safely, minor use of hands   Standing Unsupported with Eyes Closed Able to stand 10 seconds with supervision   Standing Ubsupported with Feet Together Able to place feet together independently but unable to hold for 30 seconds   From Standing, Reach Forward with Outstretched Arm Can reach forward >12 cm safely (5")   From Standing Position, Pick up Object from Floor Able to pick up shoe safely and easily   From Standing Position, Turn to Look Behind Over each Shoulder Turn sideways only but maintains balance   Turn 360 Degrees Able to turn 360 degrees safely but slowly   Standing Unsupported, Alternately Place Feet on Step/Stool Able to complete >2 steps/needs minimal assist   Standing Unsupported, One Foot in [employer] balance while stepping or standing   Standing on One Leg Unable  to try or needs assist to prevent fall   Total Score 37     Lumbar Exercises: Standing   Other Standing Lumbar Exercises 1 foot on airex 1 on ground 10 times each ball toss     Lumbar Exercises: Seated   Sit to Stand 10 reps  no hands wt ball     Knee/Hip Exercises: Aerobic   Nustep L 4 6 min NO rest for endurance                  PT Short Term Goals - 07/13/16 1646      PT SHORT TERM GOAL #1   Title pt will perform Berg balance test for balance screen   Baseline 37/56   Status On-going           PT Long Term Goals - 07/10/16 1345      PT LONG TERM GOAL #1   Title Pt will improve 6mn walk to 500 feet with LRAD   Status On-going     PT LONG TERM GOAL #2   Title Pt will perform sit to stand x  5 in 45seconds   Status Partially Met     PT LONG TERM GOAL #4   Title pt will report return to work readiness   Status On-going               Plan - 07/13/16 1647    Clinical Impression Statement pt VERY fearful of falling and amb without walker. LOB 2 times 1 without AD and one with high level balance resulting in max A to prevent fall. BERG 37 today so pt is in high risk fall category. Working to [employer] endurance and balance.   PT Next Visit Plan func gait and balance      Patient will benefit from skilled therapeutic intervention in order to improve the following deficits and impairments:     Visit Diagnosis: Decreased functional mobility and endurance  Cerebellar stroke, acute (HCC)  Muscle weakness (generalized)  Other abnormalities of gait and mobility     Problem List Patient Active Problem List   Diagnosis Date Noted  . Acute blood loss anemia   . Elevated serum creatinine   . Slow transit constipation   . Benign essential HTN   . Generalized anxiety disorder   . Gait disturbance, post-stroke 03/13/2016  . Cerebellar stroke, acute ([place] 03/10/2016  . Super obese   . Prediabetes   . Ataxia, post-stroke   . Acute cystitis without hematuria   . CVA (cerebral vascular accident) ([place] 03/06/2016  . Morbid obesity ([place] 02/29/2016  . OSA (obstructive sleep apnea) 11/24/2014  . Dyspnea 10/02/2014  . Anemia 04/05/2011  . Urinary tract infection, site not specified 04/05/2011  . Diabetes mellitus ([place] 02/22/2011  . Hyperlipidemia 02/22/2011  . CAD (coronary artery disease) 01/07/2011  . Hypertension 01/07/2011    Tyrene Nader,ANGIE PTA 07/13/2016, 4:49 PM  [place] [address]Phone: [phone]  Fax:  [phone] Name: Megan PresswoodMRN: [id number] of Birth: 417-Aug-1952

## 2016-07-18 ENCOUNTER — Encounter: Payer: Self-pay | Admitting: Physical Therapy

## 2016-07-18 ENCOUNTER — Ambulatory Visit: Payer: Medicare Other | Admitting: Physical Therapy

## 2016-07-18 DIAGNOSIS — M6281 Muscle weakness (generalized): Secondary | ICD-10-CM

## 2016-07-18 DIAGNOSIS — Z7409 Other reduced mobility: Secondary | ICD-10-CM | POA: Diagnosis not present

## 2016-07-18 DIAGNOSIS — R2689 Other abnormalities of gait and mobility: Secondary | ICD-10-CM

## 2016-07-18 DIAGNOSIS — I639 Cerebral infarction, unspecified: Secondary | ICD-10-CM

## 2016-07-18 NOTE — Therapy (Signed)
[place], [place], [address] Phone: [phone]   Fax:  [phone]  Physical Therapy Treatment  Patient Details  Name: Megan Atkinson MRN: [id number] Date of Birth: 21-Oct-1950 Referring Provider: Alysia Penna  Encounter Date: 07/18/2016      PT End of Session - 07/18/16 1154    Visit Number 25   Date for PT Re-Evaluation 08/05/16   PT Start Time 1100   PT Stop Time 1145   PT Time Calculation (min) 45 min      Past Medical History:  Diagnosis Date  . CAD (coronary artery disease)    small NSTEMI 11/12: LHC 01/06/11:  LAD 90-95%, mD1 90%, pD2 50%,  pOM1 50%, RCA occluded,  EF > 65%;  CABG  12/12: L-LAD  . Diabetes mellitus   . Glucose intolerance (impaired glucose tolerance)    Hemoglobin A1c 6.4 in 12/2010  . HTN (hypertension)    Echo 01/06/11: EF 65%  . Hyperlipidemia   . Obesity   . Stroke [facility]) 03/06/2016    Past Surgical History:  Procedure Laterality Date  . ABDOMINAL HYSTERECTOMY    . APPENDECTOMY    . CORONARY ARTERY BYPASS GRAFT  01/12/2011   Procedure: CORONARY ARTERY BYPASS GRAFTING (CABG);  Surgeon: Gaye Pollack, MD;  Location: [place];  Service: Open Heart Surgery;  Laterality: N/A;  coronary artery bypass graft times one using left internal mammary artery . Attempted endoscopic saphenous vein harvest  . LEFT HEART CATHETERIZATION WITH CORONARY ANGIOGRAM N/A 01/06/2011   Procedure: LEFT HEART CATHETERIZATION WITH CORONARY ANGIOGRAM;  Surgeon: Minus Breeding, MD;  Location: [facility] CATH LAB;  Service: Cardiovascular;  Laterality: N/A;    There were no vitals filed for this visit.      Subjective Assessment - 07/18/16 1133    Subjective 45 min early- mixed up times. nothing new to report                         [facility] Adult PT Treatment/Exercise - 07/18/16 0001      Ambulation/Gait   Gait Comments amb with SPC at end of session 75 feet, very  fatigued and slow with shuffled gait min A     Lumbar Exercises: Aerobic   Stationary Bike Nustep L 4 8 min - no rest for endurance     Lumbar Exercises: Standing   Other Standing Lumbar Exercises on airex 5# 15 each leg- freq rest needed. heel raises, mini squats, hip 3 way, marhcing and HS curl     Lumbar Exercises: Seated   Long Arc Quad on Chair Strengthening;Right;Left;1 set;15 reps;Weights   LAQ on Chair Weights (lbs) 5   Sit to Stand 15 reps  on airex with UE                  PT Short Term Goals - 07/13/16 1646      PT SHORT TERM GOAL #1   Title pt will perform Berg balance test for balance screen   Baseline 37/56   Status On-going           PT Long Term Goals - 07/10/16 1345      PT LONG TERM GOAL #1   Title Pt will improve 40mn walk to 500 feet with LRAD   Status On-going     PT LONG TERM GOAL #2   Title Pt will perform sit to stand x 5 in 45seconds  Status Partially Met     PT LONG TERM GOAL #4   Title pt will report return to work readiness   Status On-going               Plan - 07/18/16 1154    Clinical Impression Statement pt still very fearful of falling and difficulty with balance ex and gait without RW. pt denies falls and able to get around with RW but wants to RTW and can not do so on walker.   PT Next Visit Plan func gait and balance- pt sees MD monday June 18      Patient will benefit from skilled therapeutic intervention in order to improve the following deficits and impairments:     Visit Diagnosis: Decreased functional mobility and endurance  Cerebellar stroke, acute (HCC)  Muscle weakness (generalized)  Other abnormalities of gait and mobility     Problem List Patient Active Problem List   Diagnosis Date Noted  . Acute blood loss anemia   . Elevated serum creatinine   . Slow transit constipation   . Benign essential HTN   . Generalized anxiety disorder   . Gait disturbance, post-stroke 03/13/2016  .  Cerebellar stroke, acute ([place]) 03/10/2016  . Super obese   . Prediabetes   . Ataxia, post-stroke   . Acute cystitis without hematuria   . CVA (cerebral vascular accident) ([place]) 03/06/2016  . Morbid obesity ([place]) 02/29/2016  . OSA (obstructive sleep apnea) 11/24/2014  . Dyspnea 10/02/2014  . Anemia 04/05/2011  . Urinary tract infection, site not specified 04/05/2011  . Diabetes mellitus ([place]) 02/22/2011  . Hyperlipidemia 02/22/2011  . CAD (coronary artery disease) 01/07/2011  . Hypertension 01/07/2011    PAYSEUR,ANGIE PTA 07/18/2016, 11:58 AM  [place], [place], [address] Phone: [phone]   Fax:  [phone]  Name: Megan Atkinson MRN: [id number] Date of Birth: 09-09-1950

## 2016-07-20 ENCOUNTER — Ambulatory Visit: Payer: Medicare Other | Admitting: Physical Therapy

## 2016-07-20 ENCOUNTER — Encounter: Payer: Self-pay | Admitting: Physical Therapy

## 2016-07-20 DIAGNOSIS — Z7409 Other reduced mobility: Secondary | ICD-10-CM | POA: Diagnosis not present

## 2016-07-20 DIAGNOSIS — I639 Cerebral infarction, unspecified: Secondary | ICD-10-CM

## 2016-07-20 DIAGNOSIS — M6281 Muscle weakness (generalized): Secondary | ICD-10-CM

## 2016-07-20 NOTE — Therapy (Signed)
[place], [place], [address] Phone: [phone]   Fax:  [phone]  Physical Therapy Treatment  Patient Details  Name: Megan Atkinson MRN: [id number] Date of Birth: 1950-03-11 Referring Provider: Alysia Penna  Encounter Date: 07/20/2016      PT End of Session - 07/20/16 1222    Visit Number 26   Date for PT Re-Evaluation 08/05/16   PT Start Time 1142   PT Stop Time 1222   PT Time Calculation (min) 40 min   Activity Tolerance Patient tolerated treatment well   Behavior During Therapy [facility] for tasks assessed/performed      Past Medical History:  Diagnosis Date  . CAD (coronary artery disease)    small NSTEMI 11/12: LHC 01/06/11:  LAD 90-95%, mD1 90%, pD2 50%,  pOM1 50%, RCA occluded,  EF > 65%;  CABG  12/12: L-LAD  . Diabetes mellitus   . Glucose intolerance (impaired glucose tolerance)    Hemoglobin A1c 6.4 in 12/2010  . HTN (hypertension)    Echo 01/06/11: EF 65%  . Hyperlipidemia   . Obesity   . Stroke [facility]) 03/06/2016    Past Surgical History:  Procedure Laterality Date  . ABDOMINAL HYSTERECTOMY    . APPENDECTOMY    . CORONARY ARTERY BYPASS GRAFT  01/12/2011   Procedure: CORONARY ARTERY BYPASS GRAFTING (CABG);  Surgeon: Gaye Pollack, MD;  Location: [place];  Service: Open Heart Surgery;  Laterality: N/A;  coronary artery bypass graft times one using left internal mammary artery . Attempted endoscopic saphenous vein harvest  . LEFT HEART CATHETERIZATION WITH CORONARY ANGIOGRAM N/A 01/06/2011   Procedure: LEFT HEART CATHETERIZATION WITH CORONARY ANGIOGRAM;  Surgeon: Minus Breeding, MD;  Location: [facility] CATH LAB;  Service: Cardiovascular;  Laterality: N/A;    There were no vitals filed for this visit.      Subjective Assessment - 07/20/16 1144    Subjective "Im tired"   Currently in Pain? No/denies   Pain Score 0-No pain                         [facility] Adult  PT Treatment/Exercise - 07/20/16 0001      Ambulation/Gait   Ambulation/Gait Yes   Ambulation/Gait Assistance 6: Modified independent (Device/Increase time);5: Supervision;4: Min guard   Assistive device Straight cane   Gait Pattern Shuffle;Poor foot clearance - right;Decreased arm swing - left;Decreased step length - right;Decreased step length - left;Decreased hip/knee flexion - right;Decreased hip/knee flexion - left;Step-through pattern   Ambulation Surface Level;Indoor   Gait Comments amb with SPC 75 feet, very fatigued and slow with shuffled gait min A     Lumbar Exercises: Aerobic   Stationary Bike Nustep L 5 6 min - no rest for endurance   Elliptical I5 R 5 4 min      Lumbar Exercises: Seated   Sit to Stand 10 reps  From mat table with UE use                   PT Short Term Goals - 07/13/16 1646      PT SHORT TERM GOAL #1   Title pt will perform Berg balance test for balance screen   Baseline 37/56   Status On-going           PT Long Term Goals - 07/20/16 1223      PT LONG TERM GOAL #1   Title Pt will improve  23mn walk to 500 feet with LRAD   Status On-going     PT LONG TERM GOAL #2   Title Pt will perform sit to stand x 5 in 45seconds   Status Partially Met     PT LONG TERM GOAL #4   Title pt will report return to work readiness   Status On-going               Plan - 07/20/16 1222    Clinical Impression Statement Pt continues to be very fearful of falling. Increase time need to complete today activities. Pt very fatigue with today's gait trial, requiring max encouragement to keep going.   Rehab Potential Fair   PT Duration 8 weeks   PT Treatment/Interventions ETeacher, adult educationtraining;Gait training;Therapeutic activities;Therapeutic exercise;Neuromuscular re-education;Patient/family education;Passive range of motion   PT Next Visit Plan func gait and balance- pt sees MD monday June 18      Patient will benefit from skilled  therapeutic intervention in order to improve the following deficits and impairments:  Decreased activity tolerance, Decreased strength, Difficulty walking, Decreased balance, Decreased endurance, Abnormal gait  Visit Diagnosis: Decreased functional mobility and endurance  Cerebellar stroke, acute (HCC)  Muscle weakness (generalized)     Problem List Patient Active Problem List   Diagnosis Date Noted  . Acute blood loss anemia   . Elevated serum creatinine   . Slow transit constipation   . Benign essential HTN   . Generalized anxiety disorder   . Gait disturbance, post-stroke 03/13/2016  . Cerebellar stroke, acute ([place] 03/10/2016  . Super obese   . Prediabetes   . Ataxia, post-stroke   . Acute cystitis without hematuria   . CVA (cerebral vascular accident) ([place] 03/06/2016  . Morbid obesity ([place] 02/29/2016  . OSA (obstructive sleep apnea) 11/24/2014  . Dyspnea 10/02/2014  . Anemia 04/05/2011  . Urinary tract infection, site not specified 04/05/2011  . Diabetes mellitus ([place] 02/22/2011  . Hyperlipidemia 02/22/2011  . CAD (coronary artery disease) 01/07/2011  . Hypertension 01/07/2011    RScot Jun6/14/2018, 12:24 PM  [place] [address]Phone: [phone]  Fax:  [phone] Name: BKatria BottsMRN: [id number] of Birth: 4June 25, 1952

## 2016-07-24 ENCOUNTER — Encounter: Payer: Medicare Other | Attending: Physical Medicine & Rehabilitation

## 2016-07-24 ENCOUNTER — Encounter: Payer: Self-pay | Admitting: Physical Medicine & Rehabilitation

## 2016-07-24 ENCOUNTER — Ambulatory Visit (HOSPITAL_BASED_OUTPATIENT_CLINIC_OR_DEPARTMENT_OTHER): Payer: Medicare Other | Admitting: Physical Medicine & Rehabilitation

## 2016-07-24 VITALS — BP 181/80 | HR 75

## 2016-07-24 DIAGNOSIS — Z8249 Family history of ischemic heart disease and other diseases of the circulatory system: Secondary | ICD-10-CM | POA: Insufficient documentation

## 2016-07-24 DIAGNOSIS — Z90710 Acquired absence of both cervix and uterus: Secondary | ICD-10-CM | POA: Diagnosis not present

## 2016-07-24 DIAGNOSIS — E669 Obesity, unspecified: Secondary | ICD-10-CM | POA: Insufficient documentation

## 2016-07-24 DIAGNOSIS — I69393 Ataxia following cerebral infarction: Secondary | ICD-10-CM | POA: Diagnosis not present

## 2016-07-24 DIAGNOSIS — R269 Unspecified abnormalities of gait and mobility: Secondary | ICD-10-CM

## 2016-07-24 DIAGNOSIS — I2510 Atherosclerotic heart disease of native coronary artery without angina pectoris: Secondary | ICD-10-CM | POA: Insufficient documentation

## 2016-07-24 DIAGNOSIS — E119 Type 2 diabetes mellitus without complications: Secondary | ICD-10-CM | POA: Diagnosis not present

## 2016-07-24 DIAGNOSIS — Z951 Presence of aortocoronary bypass graft: Secondary | ICD-10-CM | POA: Diagnosis not present

## 2016-07-24 DIAGNOSIS — E785 Hyperlipidemia, unspecified: Secondary | ICD-10-CM | POA: Insufficient documentation

## 2016-07-24 DIAGNOSIS — Z955 Presence of coronary angioplasty implant and graft: Secondary | ICD-10-CM | POA: Diagnosis not present

## 2016-07-24 DIAGNOSIS — I10 Essential (primary) hypertension: Secondary | ICD-10-CM | POA: Insufficient documentation

## 2016-07-24 DIAGNOSIS — I69398 Other sequelae of cerebral infarction: Secondary | ICD-10-CM | POA: Diagnosis not present

## 2016-07-24 NOTE — Progress Notes (Signed)
Subjective:    Patient ID: Megan Atkinson, female    DOB: 29-Jan-1951, 66 y.o.   MRN: [id number] 66 y.o.femalewith history of CAD, HTN, morbid obesity who was admitted on 03/06/16 with vertigo and left sided weakness with numbness. CT head reviewed, showing left cerebellar infarct,thought to be subacute. TPA not administered. CTA head/neck no significant stenosis in neck and moderate focal stenosis left VA. 2D echo with eF 60-65% with mild to moderately calcified aortic valve and grade I diastolic dysfunction. BLE dopplers negative for DVT. Dr. Roda Shutters felt that stroke due to atherosclerosis and multiple risk factors. He recommended ASA/Plavix for three months followed by plavix alone. MBS done 1/30 revealing mild pharyngeal dysphagia with penetration of thin therefore chin tuck with thin liquids recommended. Swallow re-eval done today showing improvement and no need for chin tuck HPI  Patient concerned about her blood pressure. Has followed up with primary care, prior to her stroke, patient had cardiology managing her blood pressure.  Patient has returned to driving, no issues reported. Works with developmentally disabled adults Denies heavy lifting Uses straight cane in physical therapy. Min assist. She is modified independent with rolling walker  Dressing and bathing at home independently.  Job does not require a lot of walking. She is state, sitting at a table, administering medication taking blood pressure. 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 Pain Inventory Average Pain 0 Pain Right Now 0 My pain is na  In the last 24 hours, has pain interfered with the following? General activity 0 Relation with others 0 Enjoyment of life 0 What TIME of day is your pain at its worst? na Sleep (in general) Fair  Pain is worse with: n Pain improves with: na Relief from Meds: na  Mobility walk with assistance use a walker ability to climb steps?  yes do you  drive?  yes  Function retired  Neuro/Psych bladder control problems trouble walking  Prior Studies Any changes since last visit?  no  Physicians involved in your care Any changes since last visit?  no   Family History  Problem Relation Age of Onset  . Coronary artery disease Unknown        No family history  . Heart disease Mother   . Heart disease Father   . Heart disease Maternal Grandmother    Social History   Social History  . Marital status: Single    Spouse name: N/A  . Number of children: 2  . Years of education: N/A   Occupational History  .  [employer]   Social History Main Topics  . Smoking status: Never Smoker  . Smokeless tobacco: Never Used  . Alcohol use Yes     Comment: Occasional  . Drug use: No  . Sexual activity: Not on file   Other Topics Concern  . Not on file   Social History Narrative  . No narrative on file   Past Surgical History:  Procedure Laterality Date  . ABDOMINAL HYSTERECTOMY    . APPENDECTOMY    . CORONARY ARTERY BYPASS GRAFT  01/12/2011   Procedure: CORONARY ARTERY BYPASS GRAFTING (CABG);  Surgeon: Alleen Borne, MD;  Location[facility];  Service: Open Heart Surgery;  Laterality: N/A;  coronary artery bypass graft times one using left internal mammary artery . Attempted endoscopic saphenous vein harvest  . LEFT HEART CATHETERIZATION WITH CORONARY ANGIOGRAM N/A 01/06/2011   Procedure: LEFT HEART CATHETERIZATION WITH CORONARY ANGIOGRAM;  Surgeon: Rollene Rotunda, MD;  [facility]: [facility] CATH  LAB;  Service: Cardiovascular;  Laterality: N/A;   Past Medical History:  Diagnosis Date  . CAD (coronary artery disease)    small NSTEMI 11/12: LHC 01/06/11:  LAD 90-95%, mD1 90%, pD2 50%,  pOM1 50%, RCA occluded,  EF > 65%;  CABG  12/12: L-LAD  . Diabetes mellitus   . Glucose intolerance (impaired glucose tolerance)    Hemoglobin A1c 6.4 in 12/2010  . HTN (hypertension)    Echo 01/06/11: EF 65%  . Hyperlipidemia   . Obesity   . Stroke (HCC)  03/06/2016   There were no vitals taken for this visit.  Opioid Risk Score:   Fall Risk Score:  `1  Depression screen PHQ 2/9  Depression screen PHQ 2/9 04/26/2016  Decreased Interest 0  Down, Depressed, Hopeless 0  PHQ - 2 Score 0     Review of Systems  Constitutional: Negative.   HENT: Negative.   Eyes: Negative.   Respiratory: Negative.   Cardiovascular: Negative.   Gastrointestinal: Negative.   Endocrine: Negative.   Genitourinary: Negative.   Musculoskeletal: Negative.   Skin: Negative.   Allergic/Immunologic: Negative.   Neurological: Negative.   Hematological: Negative.   Psychiatric/Behavioral: Negative.   All other systems reviewed and are negative.      Objective:   Physical Exam  Constitutional: She is oriented to person, place, and time. She appears well-developed and well-nourished.  HENT:  Head: Normocephalic and atraumatic.  Eyes: Conjunctivae and EOM are normal. Pupils are equal, round, and reactive to light.  Neck: Normal range of motion.  Neurological: She is alert and oriented to person, place, and time.  Psychiatric: She has a normal mood and affect. Her behavior is normal. Judgment and thought content normal.  Nursing note and vitals reviewed.   Mild dysmetria, left upper extremity Motor strength is 5/5 bilateral deltoid, biceps, triceps, grip 3 minus bilateral hip flexors 4 bilateral, knee extensors 4 bilateral ankle dorsiflexors.  Eyes have no evidence of nystagmus  Gait is with rolling walker. No evidence of toe drag or knee instability. She has a small step length and a wide base of support, although part of this is due to body habitus     Assessment & Plan:  1. History of left cerebellar infarct with residual ataxia of gait and left upper extremity dysmetria. She is made good improvements with outpatient therapy. Still requiring some min to supervision level with cane but is modified independent with all self-care and mobility at a  walker level. She has successfully returned to driving. She would like to return to work, which by her description sounds like a sedentary position. She has a Radio broadcast assistantcoworker as well.  I have written a letter clearing her to go back to work in a sedentary work environment. No lifting greater than 5 pounds. No carrying objects. She must use her walker to ambulate. She will bring this letter to her employer.  Continue outpatient therapy for now., Although I think she is starting to plateau. Patient is approximately 5 months post stroke  Follow-up with neurology Follow-up for blood pressure with PCP and if no further improvements may need to see cardiology as well  Physical medicine and rehabilitation follow-up in one month.

## 2016-07-25 ENCOUNTER — Ambulatory Visit: Payer: Medicare Other | Admitting: Physical Therapy

## 2016-07-25 ENCOUNTER — Encounter: Payer: Self-pay | Admitting: Physical Therapy

## 2016-07-25 DIAGNOSIS — Z7409 Other reduced mobility: Secondary | ICD-10-CM

## 2016-07-25 DIAGNOSIS — I639 Cerebral infarction, unspecified: Secondary | ICD-10-CM

## 2016-07-25 NOTE — Therapy (Signed)
[place], [place], [address] Phone: [phone]   Fax:  [phone]  Physical Therapy Treatment  Patient Details  Name: Megan Atkinson MRN: [id number] Date of Birth: 01-15-1951 Referring Provider: Alysia Penna  Encounter Date: 07/25/2016      PT End of Session - 07/25/16 1344    Visit Number 27   Date for PT Re-Evaluation 08/05/16   PT Start Time 1300   PT Stop Time 1344   PT Time Calculation (min) 44 min   Activity Tolerance Patient tolerated treatment well   Behavior During Therapy [facility] for tasks assessed/performed      Past Medical History:  Diagnosis Date  . CAD (coronary artery disease)    small NSTEMI 11/12: LHC 01/06/11:  LAD 90-95%, mD1 90%, pD2 50%,  pOM1 50%, RCA occluded,  EF > 65%;  CABG  12/12: L-LAD  . Diabetes mellitus   . Glucose intolerance (impaired glucose tolerance)    Hemoglobin A1c 6.4 in 12/2010  . HTN (hypertension)    Echo 01/06/11: EF 65%  . Hyperlipidemia   . Obesity   . Stroke [facility]) 03/06/2016    Past Surgical History:  Procedure Laterality Date  . ABDOMINAL HYSTERECTOMY    . APPENDECTOMY    . CORONARY ARTERY BYPASS GRAFT  01/12/2011   Procedure: CORONARY ARTERY BYPASS GRAFTING (CABG);  Surgeon: Gaye Pollack, MD;  Location: [place];  Service: Open Heart Surgery;  Laterality: N/A;  coronary artery bypass graft times one using left internal mammary artery . Attempted endoscopic saphenous vein harvest  . LEFT HEART CATHETERIZATION WITH CORONARY ANGIOGRAM N/A 01/06/2011   Procedure: LEFT HEART CATHETERIZATION WITH CORONARY ANGIOGRAM;  Surgeon: Minus Breeding, MD;  Location: [facility] CATH LAB;  Service: Cardiovascular;  Laterality: N/A;    There were no vitals filed for this visit.      Subjective Assessment - 07/25/16 1303    Subjective "Im ok"   Currently in Pain? No/denies   Pain Score 0-No pain                         [facility] Adult PT  Treatment/Exercise - 07/25/16 0001      Ambulation/Gait   Gait Comments amb with SPC 75 feet, very fatigued and slow with shuffled gait min A     High Level Balance   High Level Balance Activities Side stepping   High Level Balance Comments no UE support in fronyt of mat table      Lumbar Exercises: Aerobic   Stationary Bike Nustep L 5 6 min - no rest for endurance   Elliptical I5 R 5 5 min      Lumbar Exercises: Supine   Other Supine Lumbar Exercises Seated OHP 4lb 2x10      Knee/Hip Exercises: Standing   Other Standing Knee Exercises ALT toe taps 4in 2x10  HHA x1      Knee/Hip Exercises: Seated   Sit to Sand 2 sets;10 reps;with UE support                  PT Short Term Goals - 07/13/16 1646      PT SHORT TERM GOAL #1   Title pt will perform Berg balance test for balance screen   Baseline 37/56   Status On-going           PT Long Term Goals - 07/20/16 1223      PT LONG TERM  GOAL #1   Title Pt will improve 66mn walk to 500 feet with LRAD   Status On-going     PT LONG TERM GOAL #2   Title Pt will perform sit to stand x 5 in 45seconds   Status Partially Met     PT LONG TERM GOAL #4   Title pt will report return to work readiness   Status On-going               Plan - 07/25/16 1344    Clinical Impression Statement fearful of falling, requires man encouragement to perform standing interventions. Fatigues quick and requires frequent rest. Increase time needed to complete gait trial   Rehab Potential Fair   PT Duration 8 weeks   PT Treatment/Interventions Electrical Stimulation;Stair training;Gait training;Therapeutic activities;Therapeutic exercise;Neuromuscular re-education;Patient/family education;Passive range of motion   PT Next Visit Plan func gait and balance-      Patient will benefit from skilled therapeutic intervention in order to improve the following deficits and impairments:  Decreased activity tolerance, Decreased strength,  Difficulty walking, Decreased balance, Decreased endurance, Abnormal gait  Visit Diagnosis: Cerebellar stroke, acute (HCC)  Decreased functional mobility and endurance     Problem List Patient Active Problem List   Diagnosis Date Noted  . Acute blood loss anemia   . Elevated serum creatinine   . Slow transit constipation   . Benign essential HTN   . Generalized anxiety disorder   . Gait disturbance, post-stroke 03/13/2016  . Cerebellar stroke, acute ([place] 03/10/2016  . Super obese   . Prediabetes   . Ataxia, post-stroke   . Acute cystitis without hematuria   . CVA (cerebral vascular accident) ([place] 03/06/2016  . Morbid obesity ([place] 02/29/2016  . OSA (obstructive sleep apnea) 11/24/2014  . Dyspnea 10/02/2014  . Anemia 04/05/2011  . Urinary tract infection, site not specified 04/05/2011  . Diabetes mellitus ([place] 02/22/2011  . Hyperlipidemia 02/22/2011  . CAD (coronary artery disease) 01/07/2011  . Hypertension 01/07/2011    RScot Jun PTA 07/25/2016, 1:50 PM  [place] [address]Phone: [phone]  Fax:  [phone] Name: Megan McconicoMRN: [id number] of Birth: 405/29/1952

## 2016-07-27 ENCOUNTER — Encounter: Payer: Self-pay | Admitting: Physical Therapy

## 2016-07-27 ENCOUNTER — Ambulatory Visit: Payer: Medicare Other | Admitting: Physical Therapy

## 2016-07-27 DIAGNOSIS — Z7409 Other reduced mobility: Secondary | ICD-10-CM | POA: Diagnosis not present

## 2016-07-27 DIAGNOSIS — I639 Cerebral infarction, unspecified: Secondary | ICD-10-CM

## 2016-07-27 DIAGNOSIS — M6281 Muscle weakness (generalized): Secondary | ICD-10-CM

## 2016-07-27 NOTE — Therapy (Signed)
[facility] [place], [place], [address] Phone: [phone]   Fax:  [phone]  Physical Therapy Treatment  Patient Details  Name: Megan Atkinson MRN: [id number] Date of Birth: 12-31-1950 Referring Provider: Alysia Penna  Encounter Date: 07/27/2016      PT End of Session - 07/27/16 1229    Visit Number 28   Date for PT Re-Evaluation 08/05/16   PT Start Time 0102   PT Stop Time 1229   PT Time Calculation (min) 44 min      Past Medical History:  Diagnosis Date  . CAD (coronary artery disease)    small NSTEMI 11/12: LHC 01/06/11:  LAD 90-95%, mD1 90%, pD2 50%,  pOM1 50%, RCA occluded,  EF > 65%;  CABG  12/12: L-LAD  . Diabetes mellitus   . Glucose intolerance (impaired glucose tolerance)    Hemoglobin A1c 6.4 in 12/2010  . HTN (hypertension)    Echo 01/06/11: EF 65%  . Hyperlipidemia   . Obesity   . Stroke [facility]) 03/06/2016    Past Surgical History:  Procedure Laterality Date  . ABDOMINAL HYSTERECTOMY    . APPENDECTOMY    . CORONARY ARTERY BYPASS GRAFT  01/12/2011   Procedure: CORONARY ARTERY BYPASS GRAFTING (CABG);  Surgeon: Gaye Pollack, MD;  Location: [facility];  Service: Open Heart Surgery;  Laterality: N/A;  coronary artery bypass graft times one using left internal mammary artery . Attempted endoscopic saphenous vein harvest  . LEFT HEART CATHETERIZATION WITH CORONARY ANGIOGRAM N/A 01/06/2011   Procedure: LEFT HEART CATHETERIZATION WITH CORONARY ANGIOGRAM;  Surgeon: Minus Breeding, MD;  Location: [facility] CATH LAB;  Service: Cardiovascular;  Laterality: N/A;    There were no vitals filed for this visit.      Subjective Assessment - 07/27/16 1151    Subjective "doing good"   Currently in Pain? No/denies   Pain Score 0-No pain                         [facility] Adult PT Treatment/Exercise - 07/27/16 0001      Ambulation/Gait   Ambulation/Gait Yes   Ambulation/Gait  Assistance 6: Modified independent (Device/Increase time)   Ambulation Distance (Feet) 60 Feet   Assistive device Straight cane   Gait Pattern Shuffle;Poor foot clearance - right;Decreased arm swing - left;Decreased step length - right;Decreased step length - left;Decreased hip/knee flexion - right;Decreased hip/knee flexion - left;Step-through pattern   Ambulation Surface Level;Indoor   Stairs Yes   Stair Management Technique Two rails;Step to pattern;Alternating pattern   Number of Stairs 12   Height of Stairs 6   Door Management 6: Modified independent (Device/Increase time)   Gait Comments step to pattern with descents. Pt refuse to attempt stepping down with RLE.; amb SPC slow gate, fatigues quick     High Level Balance   High Level Balance Activities Side stepping   High Level Balance Comments no UE support in front of mat table      Lumbar Exercises: Aerobic   Stationary Bike Nustep L 5 6 min - no rest for endurance     Lumbar Exercises: Standing   Other Standing Lumbar Exercises Standing ball toss      Knee/Hip Exercises: Seated   Hamstring Curl 15 reps;2 sets   Hamstring Limitations black tband6                  PT Short Term Goals - 07/13/16 1646  PT SHORT TERM GOAL #1   Title pt will perform Berg balance test for balance screen   Baseline 37/56   Status On-going           PT Long Term Goals - 07/27/16 1232      PT LONG TERM GOAL #1   Title Pt will improve 59mn walk to 500 feet with LRAD   Status On-going     PT LONG TERM GOAL #3   Title pt will report the ability to walk up the stairs at home carrying an object   Status Partially Met     PT LONG TERM GOAL #4   Title pt will report return to work readiness   Status Partially Met  Pt reports that she may be able to return to work with [place]- 07/27/16 1229    Clinical Impression Statement Pt continues to be very fearful of falling and fatigues quick with most  activities. Pt progressed to stair negotiation requiring supervision assist. Pt refused to control descent with LLE out of fear, was able to use both LE when going up stairs.   Rehab Potential Fair   PT Duration 8 weeks   PT Treatment/Interventions ETeacher, adult educationtraining;Gait training;Therapeutic activities;Therapeutic exercise;Neuromuscular re-education;Patient/family education;Passive range of motion   PT Next Visit Plan D/C within next 2 visits       Patient will benefit from skilled therapeutic intervention in order to improve the following deficits and impairments:  Decreased activity tolerance, Decreased strength, Difficulty walking, Decreased balance, Decreased endurance, Abnormal gait  Visit Diagnosis: Cerebellar stroke, acute (HCC)  Decreased functional mobility and endurance  Muscle weakness (generalized)     Problem List Patient Active Problem List   Diagnosis Date Noted  . Acute blood loss anemia   . Elevated serum creatinine   . Slow transit constipation   . Benign essential HTN   . Generalized anxiety disorder   . Gait disturbance, post-stroke 03/13/2016  . Cerebellar stroke, acute ([place] 03/10/2016  . Super obese   . Prediabetes   . Ataxia, post-stroke   . Acute cystitis without hematuria   . CVA (cerebral vascular accident) ([place] 03/06/2016  . Morbid obesity ([place] 02/29/2016  . OSA (obstructive sleep apnea) 11/24/2014  . Dyspnea 10/02/2014  . Anemia 04/05/2011  . Urinary tract infection, site not specified 04/05/2011  . Diabetes mellitus ([place] 02/22/2011  . Hyperlipidemia 02/22/2011  . CAD (coronary artery disease) 01/07/2011  . Hypertension 01/07/2011    RScot Jun6/21/2018, 12:33 PM  [place] [address]Phone: [phone]  Fax:  [phone] Name: Megan FarringtonMRN: [id number] of Birth: 4Sep 12, 1952

## 2016-07-31 ENCOUNTER — Ambulatory Visit: Payer: Medicare Other | Admitting: Nurse Practitioner

## 2016-08-01 ENCOUNTER — Encounter: Payer: Self-pay | Admitting: Physical Therapy

## 2016-08-01 ENCOUNTER — Ambulatory Visit: Payer: Medicare Other | Admitting: Physical Therapy

## 2016-08-01 DIAGNOSIS — Z7409 Other reduced mobility: Secondary | ICD-10-CM | POA: Diagnosis not present

## 2016-08-01 DIAGNOSIS — M6281 Muscle weakness (generalized): Secondary | ICD-10-CM

## 2016-08-01 DIAGNOSIS — I639 Cerebral infarction, unspecified: Secondary | ICD-10-CM

## 2016-08-01 NOTE — Therapy (Signed)
[facility] [place], [place], [address] Phone: [phone]   Fax:  [phone]  Physical Therapy Treatment  Patient Details  Name: Megan Atkinson MRN: [id number] Date of Birth: Jul 10, 1950 Referring Provider: Alysia Penna  Encounter Date: 08/01/2016      PT End of Session - 08/01/16 1212    Visit Number 29   Date for PT Re-Evaluation 08/05/16   PT Start Time 1135   PT Stop Time 1215   PT Time Calculation (min) 40 min   Activity Tolerance Patient tolerated treatment well   Behavior During Therapy [facility] for tasks assessed/performed      Past Medical History:  Diagnosis Date  . CAD (coronary artery disease)    small NSTEMI 11/12: LHC 01/06/11:  LAD 90-95%, mD1 90%, pD2 50%,  pOM1 50%, RCA occluded,  EF > 65%;  CABG  12/12: L-LAD  . Diabetes mellitus   . Glucose intolerance (impaired glucose tolerance)    Hemoglobin A1c 6.4 in 12/2010  . HTN (hypertension)    Echo 01/06/11: EF 65%  . Hyperlipidemia   . Obesity   . Stroke [facility]) 03/06/2016    Past Surgical History:  Procedure Laterality Date  . ABDOMINAL HYSTERECTOMY    . APPENDECTOMY    . CORONARY ARTERY BYPASS GRAFT  01/12/2011   Procedure: CORONARY ARTERY BYPASS GRAFTING (CABG);  Surgeon: Gaye Pollack, MD;  Location: [place];  Service: Open Heart Surgery;  Laterality: N/A;  coronary artery bypass graft times one using left internal mammary artery . Attempted endoscopic saphenous vein harvest  . LEFT HEART CATHETERIZATION WITH CORONARY ANGIOGRAM N/A 01/06/2011   Procedure: LEFT HEART CATHETERIZATION WITH CORONARY ANGIOGRAM;  Surgeon: Minus Breeding, MD;  Location[facility] CATH LAB;  Service: Cardiovascular;  Laterality: N/A;    There were no vitals filed for this visit.      Subjective Assessment - 08/01/16 1139    Subjective "Im doing all righ"   Currently in Pain? No/denies   Pain Score 0-No pain                          [facility] Adult PT Treatment/Exercise - 08/01/16 0001      Ambulation/Gait   Ambulation/Gait Yes   Ambulation/Gait Assistance 5: Supervision;6: Modified independent (Device/Increase time)   Ambulation Distance (Feet) 60 Feet   Assistive device Straight cane   Gait Pattern Shuffle;Poor foot clearance - right;Decreased arm swing - left;Decreased step length - right;Decreased step length - left;Decreased hip/knee flexion - right;Decreased hip/knee flexion - left;Step-through pattern   Ambulation Surface Level;Indoor   Door Management --   Gait Comments x2 amb SPC slow gate, fatigues quick     High Level Balance   High Level Balance Activities Side stepping   High Level Balance Comments no UE support in front of mat table      Lumbar Exercises: Aerobic   Stationary Bike Nustep L 5 6 min - no rest for endurance     Lumbar Exercises: Standing   Other Standing Lumbar Exercises standing march 2x10 with SPC    Other Standing Lumbar Exercises Standiing froward reach with yellow ball 2x10      Lumbar Exercises: Supine   Other Supine Lumbar Exercises Seated OHP 3lb 2x10                   PT Short Term Goals - 07/13/16 1646      PT SHORT TERM  GOAL #1   Title pt will perform Berg balance test for balance screen   Baseline 37/56   Status On-going           PT Long Term Goals - 07/27/16 1232      PT LONG TERM GOAL #1   Title Pt will improve 72mn walk to 500 feet with LRAD   Status On-going     PT LONG TERM GOAL #3   Title pt will report the ability to walk up the stairs at home carrying an object   Status Partially Met     PT LONG TERM GOAL #4   Title pt will report return to work readiness   Status Partially Met  Pt reports that she may be able to return to work with [place]- 08/01/16 1213    Clinical Impression Statement Continues to be very fearful of falling. Requires max encouragement to keep going when she feels fatigue. She was able to  progress and attempt a second gait trial. Some instability with froward reaches and side stepping.   Rehab Potential Fair   PT Duration 8 weeks   PT Treatment/Interventions ETeacher, adult educationtraining;Gait training;Therapeutic activities;Therapeutic exercise;Neuromuscular re-education;Patient/family education;Passive range of motion   PT Next Visit Plan D/C next visit      Patient will benefit from skilled therapeutic intervention in order to improve the following deficits and impairments:  Decreased activity tolerance, Decreased strength, Difficulty walking, Decreased balance, Decreased endurance, Abnormal gait  Visit Diagnosis: Cerebellar stroke, acute (HCC)  Decreased functional mobility and endurance  Muscle weakness (generalized)     Problem List Patient Active Problem List   Diagnosis Date Noted  . Acute blood loss anemia   . Elevated serum creatinine   . Slow transit constipation   . Benign essential HTN   . Generalized anxiety disorder   . Gait disturbance, post-stroke 03/13/2016  . Cerebellar stroke, acute (HLinn[place] 03/10/2016  . Super obese   . Prediabetes   . Ataxia, post-stroke   . Acute cystitis without hematuria   . CVA (cerebral vascular accident) (HT[place] 03/06/2016  . Morbid obesity ([place] 02/29/2016  . OSA (obstructive sleep apnea) 11/24/2014  . Dyspnea 10/02/2014  . Anemia 04/05/2011  . Urinary tract infection, site not specified 04/05/2011  . Diabetes mellitus (HB[place] 02/22/2011  . Hyperlipidemia 02/22/2011  . CAD (coronary artery disease) 01/07/2011  . Hypertension 01/07/2011    RScot Jun PTA 08/01/2016, 12:15 PM  [place] [address]Phone: [phone]  Fax:  [phone] Name: Megan CapelleMRN: [id number] of Birth: [birth year]

## 2016-08-03 ENCOUNTER — Encounter: Payer: Self-pay | Admitting: Physical Therapy

## 2016-08-03 ENCOUNTER — Ambulatory Visit: Payer: Medicare Other | Admitting: Physical Therapy

## 2016-08-03 DIAGNOSIS — M6281 Muscle weakness (generalized): Secondary | ICD-10-CM

## 2016-08-03 DIAGNOSIS — I639 Cerebral infarction, unspecified: Secondary | ICD-10-CM

## 2016-08-03 DIAGNOSIS — Z7409 Other reduced mobility: Secondary | ICD-10-CM

## 2016-08-03 NOTE — Therapy (Signed)
[facility] [address] [place], [place], [address] Phone: [phone]   Fax:  [phone]  Physical Therapy Treatment  Patient Details  Name: Megan Atkinson MRN: [id number] Date of Birth: 09/28/1950 Referring Provider: Kirsteins, Andrew  Encounter Date: 08/03/2016      PT End of Session - 08/03/16 1148    Visit Number 30   Date for PT Re-Evaluation 08/05/16   PT Start Time 1135   PT Stop Time 1225   PT Time Calculation (min) 50 min      Past Medical History:  Diagnosis Date  . CAD (coronary artery disease)    small NSTEMI 11/12: LHC 01/06/11:  LAD 90-95%, mD1 90%, pD2 50%,  pOM1 50%, RCA occluded,  EF > 65%;  CABG  12/12: L-LAD  . Diabetes mellitus   . Glucose intolerance (impaired glucose tolerance)    Hemoglobin A1c 6.4 in 12/2010  . HTN (hypertension)    Echo 01/06/11: EF 65%  . Hyperlipidemia   . Obesity   . Stroke (HCC) 03/06/2016    Past Surgical History:  Procedure Laterality Date  . ABDOMINAL HYSTERECTOMY    . APPENDECTOMY    . CORONARY ARTERY BYPASS GRAFT  01/12/2011   Procedure: CORONARY ARTERY BYPASS GRAFTING (CABG);  Surgeon: [name] [name], MD;  [facility];  Service: Open Heart Surgery;  Laterality: N/A;  coronary artery bypass graft times one using left internal mammary artery . Attempted endoscopic saphenous vein harvest  . LEFT HEART CATHETERIZATION WITH CORONARY ANGIOGRAM N/A 01/06/2011   Procedure: LEFT HEART CATHETERIZATION WITH CORONARY ANGIOGRAM;  Surgeon: James Hochrein, MD;  Location: [facility] CATH LAB;  Service: Cardiovascular;  Laterality: N/A;    There were no vitals filed for this visit.      Subjective Assessment - 08/03/16 1143    Subjective graduating today , [facility] [employer]   Currently in Pain? No/denies                         [facility] Adult PT Treatment/Exercise - 08/03/16 0001      Ambulation/Gait   Gait Comments x2 amb SPC slow gate, fatigues  quick     Lumbar Exercises: Aerobic   Stationary Bike Nustep L 5 8 min - no rest for endurance   UBE (Upper Arm Bike) standing L 4 3 fwd/3back     Knee/Hip Exercises: Seated   Long Arc Quad Strengthening;Both;2 sets;10 reps;Weights   Long Arc Quad Weight 5 lbs.   Marching Limitations 2 sets 10   Marching Weights 5 lbs.   Hamstring Curl 15 reps;2 sets   Hamstring Limitations black tband   Sit to Sand 2 sets;10 reps;without UE support                PT Education - 08/03/16 1155    Education provided Yes   Education Details gym transition and pool   Person(s) Educated Patient   Methods Explanation   Comprehension Verbalized understanding          PT Short Term Goals - 08/03/16 1147      PT SHORT TERM GOAL #1   Title pt will perform Berg balance test for balance screen   Baseline 39/56   Status On-going           PT Long Term Goals - 08/03/16 1144      PT LONG TERM GOAL #1   Title Pt will improve 6min walk   to 500 feet with LRAD   Baseline pt still requiring standing rest breaks   Status Partially Met     PT LONG TERM GOAL #2   Title Pt will perform sit to stand x 5 in 45seconds   Status Achieved     PT LONG TERM GOAL #3   Title pt will report the ability to walk up the stairs at home carrying an object   Baseline needs to use bag on shld   Status Partially Met     PT LONG TERM GOAL #4   Title pt will report return to work readiness   Baseline pending   Status Partially Met               Plan - 08-19-16 1147    Clinical Impression Statement progressing with goals but goals NOT met. pt pleased with progress and requesting D/C to [facility].   PT Next Visit Plan D/C      Patient will benefit from skilled therapeutic intervention in order to improve the following deficits and impairments:     Visit Diagnosis: Cerebellar stroke, acute (HCC)  Decreased functional mobility and endurance  Muscle weakness (generalized)       G-Codes -  August 19, 2016 1157    Functional Assessment Tool Used (Outpatient Only) foto 55% limitation   Functional Limitation Mobility: Walking and moving around   Mobility: Walking and Moving Around Current Status [phone]) At least 40 percent but less than 60 percent impaired, limited or restricted   Mobility: Walking and Moving Around Goal Status [phone]) At least 20 percent but less than 40 percent impaired, limited or restricted   Mobility: Walking and Moving Around Discharge Status [phone]) At least 40 percent but less than 60 percent impaired, limited or restricted      Problem List Patient Active Problem List   Diagnosis Date Noted  . Acute blood loss anemia   . Elevated serum creatinine   . Slow transit constipation   . Benign essential HTN   . Generalized anxiety disorder   . Gait disturbance, post-stroke 03/13/2016  . Cerebellar stroke, acute ([place]) 03/10/2016  . Super obese   . Prediabetes   . Ataxia, post-stroke   . Acute cystitis without hematuria   . CVA (cerebral vascular accident) ([place]) 03/06/2016  . Morbid obesity ([place]) 02/29/2016  . OSA (obstructive sleep apnea) 11/24/2014  . Dyspnea 10/02/2014  . Anemia 04/05/2011  . Urinary tract infection, site not specified 04/05/2011  . Diabetes mellitus ([place]) 02/22/2011  . Hyperlipidemia 02/22/2011  . CAD (coronary artery disease) 01/07/2011  . Hypertension 01/07/2011   PHYSICAL THERAPY DISCHARGE SUMMARY     Plan: Patient agrees to discharge.  Patient goals were not met. Patient is being discharged due to meeting the stated rehab goals.  ?????      PAYSEUR,ANGIE PTA 08-19-2016, 12:09 PM  [place], [place], [address] Phone: [phone]   Fax:  [phone]  Name: Annistyn Depass MRN: [id number] Date of Birth: 11-17-1950

## 2016-08-07 DIAGNOSIS — I639 Cerebral infarction, unspecified: Secondary | ICD-10-CM | POA: Diagnosis not present

## 2016-08-07 DIAGNOSIS — I259 Chronic ischemic heart disease, unspecified: Secondary | ICD-10-CM | POA: Diagnosis not present

## 2016-08-07 DIAGNOSIS — G4733 Obstructive sleep apnea (adult) (pediatric): Secondary | ICD-10-CM | POA: Diagnosis not present

## 2016-08-07 DIAGNOSIS — I10 Essential (primary) hypertension: Secondary | ICD-10-CM | POA: Diagnosis not present

## 2016-09-04 ENCOUNTER — Ambulatory Visit: Payer: Medicare Other | Admitting: Physical Medicine & Rehabilitation

## 2016-09-07 DIAGNOSIS — I10 Essential (primary) hypertension: Secondary | ICD-10-CM | POA: Diagnosis not present

## 2016-09-07 DIAGNOSIS — I259 Chronic ischemic heart disease, unspecified: Secondary | ICD-10-CM | POA: Diagnosis not present

## 2016-09-07 DIAGNOSIS — G4733 Obstructive sleep apnea (adult) (pediatric): Secondary | ICD-10-CM | POA: Diagnosis not present

## 2016-09-07 DIAGNOSIS — I639 Cerebral infarction, unspecified: Secondary | ICD-10-CM | POA: Diagnosis not present

## 2016-09-11 ENCOUNTER — Other Ambulatory Visit: Payer: Self-pay | Admitting: Nurse Practitioner

## 2016-09-11 DIAGNOSIS — I639 Cerebral infarction, unspecified: Secondary | ICD-10-CM

## 2016-09-11 DIAGNOSIS — I10 Essential (primary) hypertension: Secondary | ICD-10-CM

## 2016-09-11 NOTE — Telephone Encounter (Signed)
Sent in 3 mo supply for pt, she need to see PCP for more refills.   Please help call pt and offer an appt.

## 2016-09-12 NOTE — Telephone Encounter (Signed)
LM for pt to call back to schedule °

## 2016-10-08 DIAGNOSIS — I259 Chronic ischemic heart disease, unspecified: Secondary | ICD-10-CM | POA: Diagnosis not present

## 2016-10-08 DIAGNOSIS — I10 Essential (primary) hypertension: Secondary | ICD-10-CM | POA: Diagnosis not present

## 2016-10-08 DIAGNOSIS — G4733 Obstructive sleep apnea (adult) (pediatric): Secondary | ICD-10-CM | POA: Diagnosis not present

## 2016-10-08 DIAGNOSIS — I639 Cerebral infarction, unspecified: Secondary | ICD-10-CM | POA: Diagnosis not present

## 2016-10-11 ENCOUNTER — Other Ambulatory Visit: Payer: Self-pay | Admitting: Nurse Practitioner

## 2016-10-11 DIAGNOSIS — I639 Cerebral infarction, unspecified: Secondary | ICD-10-CM

## 2016-10-11 DIAGNOSIS — I10 Essential (primary) hypertension: Secondary | ICD-10-CM

## 2016-10-12 NOTE — Telephone Encounter (Signed)
Please advise 

## 2016-11-07 DIAGNOSIS — G4733 Obstructive sleep apnea (adult) (pediatric): Secondary | ICD-10-CM | POA: Diagnosis not present

## 2016-11-07 DIAGNOSIS — I639 Cerebral infarction, unspecified: Secondary | ICD-10-CM | POA: Diagnosis not present

## 2016-11-07 DIAGNOSIS — I259 Chronic ischemic heart disease, unspecified: Secondary | ICD-10-CM | POA: Diagnosis not present

## 2016-11-07 DIAGNOSIS — I10 Essential (primary) hypertension: Secondary | ICD-10-CM | POA: Diagnosis not present

## 2016-12-06 ENCOUNTER — Ambulatory Visit (INDEPENDENT_AMBULATORY_CARE_PROVIDER_SITE_OTHER): Payer: Medicare Other | Admitting: Cardiology

## 2016-12-06 ENCOUNTER — Encounter: Payer: Self-pay | Admitting: Cardiology

## 2016-12-06 VITALS — BP 138/76 | HR 79 | Wt 358.0 lb

## 2016-12-06 DIAGNOSIS — Z23 Encounter for immunization: Secondary | ICD-10-CM | POA: Diagnosis not present

## 2016-12-06 DIAGNOSIS — I10 Essential (primary) hypertension: Secondary | ICD-10-CM | POA: Diagnosis not present

## 2016-12-06 DIAGNOSIS — I2510 Atherosclerotic heart disease of native coronary artery without angina pectoris: Secondary | ICD-10-CM

## 2016-12-06 DIAGNOSIS — E7800 Pure hypercholesterolemia, unspecified: Secondary | ICD-10-CM | POA: Diagnosis not present

## 2016-12-06 NOTE — Progress Notes (Signed)
HPI: FU CAD. She was admitted 11/12 with an NSTEMI. LHC 01/06/11: LAD 90-95%, mid D1 90%, proximal D2 50%, proximal OM 50%, RCA occluded, EF greater than 65%. She was referred for bypass. This was done with Dr. Laneta SimmersBartle 01/12/11: LIMA-LAD. The distal RCA was diffusely diseased and not thought to be graftable. Saphenous vein was fairly small and not felt suitable as a conduit. PreCABG Dopplers: No ICA stenosis. Patient had cerebellar CVA January 2018. Echocardiogram January 2018 showed normal LV systolic function, grade 1 diastolic dysfunction. Lower extremity venous Dopplers January 2018 showed no DVT. CTA January 2018 showed no hemodynamically significant stenosis in the neck. Since last seen, she denies dyspnea, chest pain, palpitations or syncope. She has chronic pedal edema.  Current Outpatient Prescriptions  Medication Sig Dispense Refill  . amLODipine (NORVASC) 2.5 MG tablet TAKE 1 TABLET BY MOUTH ONCE DAILY 90 tablet 1  . aspirin 325 MG tablet Take 1 tablet (325 mg total) by mouth daily. 100 tablet 0  . atorvastatin (LIPITOR) 80 MG tablet TAKE 1 TABLET BY MOUTH ONCE DAILY AT  6  PM 90 tablet 1  . Calcium Carbonate-Vitamin D (CALCIUM-VITAMIN D) 500-200 MG-UNIT tablet Take 1 tablet by mouth daily.    . clopidogrel (PLAVIX) 75 MG tablet TAKE 1 TABLET BY MOUTH ONCE DAILY 90 tablet 1  . Cyanocobalamin (VITAMIN B-12 IJ) weekly    . fluticasone (FLONASE) 50 MCG/ACT nasal spray Place 1 spray into both nostrils daily. 9.9 g 0  . metoprolol tartrate (LOPRESSOR) 25 MG tablet TAKE 1/2 (ONE-HALF) TABLET BY MOUTH TWICE DAILY 45 tablet 1  . senna-docusate (SENOKOT-S) 8.6-50 MG tablet Take 2 tablets by mouth 2 (two) times daily. 100 tablet 0   No current facility-administered medications for this visit.      Past Medical History:  Diagnosis Date  . CAD (coronary artery disease)    small NSTEMI 11/12: LHC 01/06/11:  LAD 90-95%, mD1 90%, pD2 50%,  pOM1 50%, RCA occluded,  EF > 65%;  CABG  12/12:  L-LAD  . Diabetes mellitus   . Glucose intolerance (impaired glucose tolerance)    Hemoglobin A1c 6.4 in 12/2010  . HTN (hypertension)    Echo 01/06/11: EF 65%  . Hyperlipidemia   . Obesity   . Stroke [facility]) 03/06/2016    Past Surgical History:  Procedure Laterality Date  . ABDOMINAL HYSTERECTOMY    . APPENDECTOMY    . CORONARY ARTERY BYPASS GRAFT  01/12/2011   Procedure: CORONARY ARTERY BYPASS GRAFTING (CABG);  Surgeon: Alleen BorneBryan K [name], MD;  [facility];  Service: Open Heart Surgery;  Laterality: N/A;  coronary artery bypass graft times one using left internal mammary artery . Attempted endoscopic saphenous vein harvest  . LEFT HEART CATHETERIZATION WITH CORONARY ANGIOGRAM N/A 01/06/2011   Procedure: LEFT HEART CATHETERIZATION WITH CORONARY ANGIOGRAM;  Surgeon: Rollene RotundaJames Hochrein, MD;  [facility];  Service: Cardiovascular;  Laterality: N/A;    Social History   Social History  . Marital status: Single    Spouse name: N/A  . Number of children: 2  . Years of education: N/A   Occupational History  .  [employer]   Social History Main Topics  . Smoking status: Never Smoker  . Smokeless tobacco: Never Used  . Alcohol use Yes     Comment: Occasional  . Drug use: No  . Sexual activity: Not on file   Other Topics Concern  . Not on file   Social History Narrative  .  No narrative on file    Family History  Problem Relation Age of Onset  . Coronary artery disease Unknown        No family history  . Heart disease Mother   . Heart disease Father   . Heart disease Maternal Grandmother     ROS: Some ataxia and imbalance related to prior cerebellar CVA but no fevers or chills, productive cough, hemoptysis, dysphasia, odynophagia, melena, hematochezia, dysuria, hematuria, rash, seizure activity, orthopnea, PND, pedal edema, claudication. Remaining systems are negative.  Physical Exam: Well-developed obese in no acute distress.  Skin is warm and dry.  HEENT is normal.    Neck is supple.  Chest is clear to auscultation with normal expansion.  Cardiovascular exam is regular rate and rhythm.  Abdominal exam nontender or distended. No masses palpated. Extremities show no edema. neuro grossly intact  ECG- normal sinus rhythm at a rate of 75. Left ventricular hypertrophy. Cannot rule out prior anterior infarct. Nonspecific T-wave changes. personally reviewed  A/P  1 Coronary artery disease-patient is without chest pain. Continue aspirin and statin.  2 hypertension-blood pressure is controlled. Continue present medications.  3 hyperlipidemia-continue statin. Check lipids and liver.   4 prior CVA-management per primary care. On ASA and plavix.  5 obesity-we discussed the importance of weight loss.  Olga Millers, MD

## 2016-12-06 NOTE — Patient Instructions (Signed)
Medication Instructions:   NO CHANGE  Labwork:  Your physician recommends that you return for lab work PRIOR TO EATING  Follow-Up:  Your physician wants you to follow-up in: ONE YEAR WITH DR CRENSHAW You will receive a reminder letter in the mail two months in advance. If you don't receive a letter, please call our office to schedule the follow-up appointment.   If you need a refill on your cardiac medications before your next appointment, please call your pharmacy.    

## 2016-12-07 ENCOUNTER — Other Ambulatory Visit: Payer: Self-pay | Admitting: *Deleted

## 2016-12-07 ENCOUNTER — Telehealth: Payer: Self-pay | Admitting: *Deleted

## 2016-12-07 DIAGNOSIS — E7800 Pure hypercholesterolemia, unspecified: Secondary | ICD-10-CM

## 2016-12-07 DIAGNOSIS — I10 Essential (primary) hypertension: Secondary | ICD-10-CM

## 2016-12-07 DIAGNOSIS — I639 Cerebral infarction, unspecified: Secondary | ICD-10-CM

## 2016-12-07 LAB — LIPID PANEL
CHOL/HDL RATIO: 3.6 ratio (ref 0.0–4.4)
Cholesterol, Total: 178 mg/dL (ref 100–199)
HDL: 49 mg/dL (ref >39)
LDL Calculated: 113 mg/dL — ABNORMAL HIGH (ref 0–99)
Triglycerides: 80 mg/dL (ref 0–149)
VLDL Cholesterol Cal: 16 mg/dL (ref 5–40)

## 2016-12-07 LAB — HEPATIC FUNCTION PANEL
ALBUMIN: 4.1 g/dL (ref 3.6–4.8)
ALK PHOS: 116 IU/L (ref 39–117)
ALT: 6 IU/L (ref 0–32)
AST: 11 IU/L (ref 0–40)
BILIRUBIN, DIRECT: 0.1 mg/dL (ref 0.00–0.40)
Bilirubin Total: 0.4 mg/dL (ref 0.0–1.2)
TOTAL PROTEIN: 7.3 g/dL (ref 6.0–8.5)

## 2016-12-07 MED ORDER — METOPROLOL TARTRATE 25 MG PO TABS: ORAL_TABLET | ORAL | 3 refills | Status: DC

## 2016-12-07 MED ORDER — EZETIMIBE 10 MG PO TABS: 10.0000 mg | ORAL_TABLET | Freq: Every day | ORAL | 3 refills | Status: DC

## 2016-12-07 MED ORDER — ATORVASTATIN CALCIUM 80 MG PO TABS: ORAL_TABLET | ORAL | 3 refills | Status: AC

## 2016-12-07 MED ORDER — AMLODIPINE BESYLATE 2.5 MG PO TABS: 2.5000 mg | ORAL_TABLET | Freq: Every day | ORAL | 3 refills | Status: DC

## 2016-12-07 MED ORDER — CLOPIDOGREL BISULFATE 75 MG PO TABS: 75.0000 mg | ORAL_TABLET | Freq: Every day | ORAL | 3 refills | Status: AC

## 2016-12-07 NOTE — Telephone Encounter (Signed)
Spoke with pt, aware of results. New script sent to the pharmacy and Lab orders mailed to the pt  

## 2016-12-07 NOTE — Addendum Note (Signed)
Addended by: Freddi StarrMATHIS, DEBRA W on: 12/07/2016 01:46 PM   Modules accepted: Orders

## 2016-12-07 NOTE — Telephone Encounter (Addendum)
-----   Message from Lewayne BuntingBrian S Crenshaw, MD sent at 12/07/2016  7:47 AM EDT ----- Add zetia 10 mg daily; lipids and liver 4 weeks Olga MillersBrian Crenshaw   Left message for pt to call

## 2016-12-07 NOTE — Telephone Encounter (Signed)
F/u Message ° °Pt returning RN call .please call back to discuss  °

## 2016-12-08 DIAGNOSIS — G4733 Obstructive sleep apnea (adult) (pediatric): Secondary | ICD-10-CM | POA: Diagnosis not present

## 2016-12-08 DIAGNOSIS — I639 Cerebral infarction, unspecified: Secondary | ICD-10-CM | POA: Diagnosis not present

## 2016-12-08 DIAGNOSIS — I259 Chronic ischemic heart disease, unspecified: Secondary | ICD-10-CM | POA: Diagnosis not present

## 2016-12-08 DIAGNOSIS — I10 Essential (primary) hypertension: Secondary | ICD-10-CM | POA: Diagnosis not present

## 2016-12-25 ENCOUNTER — Ambulatory Visit: Payer: Medicare Other | Admitting: Nurse Practitioner

## 2017-01-07 DIAGNOSIS — G4733 Obstructive sleep apnea (adult) (pediatric): Secondary | ICD-10-CM | POA: Diagnosis not present

## 2017-01-07 DIAGNOSIS — I639 Cerebral infarction, unspecified: Secondary | ICD-10-CM | POA: Diagnosis not present

## 2017-01-07 DIAGNOSIS — I10 Essential (primary) hypertension: Secondary | ICD-10-CM | POA: Diagnosis not present

## 2017-01-07 DIAGNOSIS — I259 Chronic ischemic heart disease, unspecified: Secondary | ICD-10-CM | POA: Diagnosis not present

## 2017-02-01 ENCOUNTER — Encounter: Payer: Self-pay | Admitting: *Deleted

## 2017-02-14 LAB — HEPATIC FUNCTION PANEL
ALT: 21 IU/L (ref 0–32)
AST: 14 IU/L (ref 0–40)
Albumin: 3.8 g/dL (ref 3.6–4.8)
Alkaline Phosphatase: 134 IU/L — ABNORMAL HIGH (ref 39–117)
BILIRUBIN, DIRECT: 0.07 mg/dL (ref 0.00–0.40)
Bilirubin Total: <0.2 mg/dL (ref 0.0–1.2)
Total Protein: 6.5 g/dL (ref 6.0–8.5)

## 2017-02-14 LAB — LIPID PANEL
CHOLESTEROL TOTAL: 143 mg/dL (ref 100–199)
Chol/HDL Ratio: 3 ratio (ref 0.0–4.4)
HDL: 47 mg/dL (ref >39)
LDL Calculated: 78 mg/dL (ref 0–99)
TRIGLYCERIDES: 90 mg/dL (ref 0–149)
VLDL Cholesterol Cal: 18 mg/dL (ref 5–40)

## 2017-02-15 ENCOUNTER — Telehealth: Payer: Self-pay | Admitting: *Deleted

## 2017-02-15 NOTE — Telephone Encounter (Signed)
-----   Message from Megan BuntingBrian S Crenshaw, MD sent at 02/14/2017  4:46 PM EST ----- No change in meds Olga MillersBrian Crenshaw

## 2017-02-15 NOTE — Telephone Encounter (Signed)
The patient was made aware of her results and verbalized her understanding. She did state that her insurance will not pay for the medication and therefore she cannot afford it. Message routed to the provider and his nurse for their knowledge.

## 2017-02-19 NOTE — Telephone Encounter (Signed)
Left message for pt to call.

## 2017-02-23 NOTE — Telephone Encounter (Signed)
Left message for pt to call.

## 2017-03-08 ENCOUNTER — Telehealth: Payer: Self-pay | Admitting: Cardiology

## 2017-03-08 NOTE — Telephone Encounter (Signed)
Spoke with pt, aware there is nothing to replace zetia.

## 2017-03-08 NOTE — Telephone Encounter (Signed)
New Message   Pt c/o medication issue:  1. Name of Medication: Zetia  2. How are you currently taking this medication (dosage and times per day)? 10mg   3. Are you having a reaction (difficulty breathing--STAT)? no  4. What is your medication issue? Patient states that the medication is to expensive. She is wanting to know is there anything else less expensive.

## 2017-04-09 DIAGNOSIS — H5213 Myopia, bilateral: Secondary | ICD-10-CM | POA: Diagnosis not present

## 2017-05-12 ENCOUNTER — Inpatient Hospital Stay (HOSPITAL_COMMUNITY)
Admission: EM | Admit: 2017-05-12 | Discharge: 2017-05-18 | DRG: 871 | Disposition: A | Discharge status: Skilled Nursing Facility | Payer: Medicare Other | Attending: Internal Medicine | Admitting: Internal Medicine

## 2017-05-12 ENCOUNTER — Emergency Department (HOSPITAL_COMMUNITY): Payer: Medicare Other

## 2017-05-12 ENCOUNTER — Inpatient Hospital Stay (HOSPITAL_COMMUNITY): Payer: Medicare Other

## 2017-05-12 DIAGNOSIS — Z6841 Body Mass Index (BMI) 40.0 and over, adult: Secondary | ICD-10-CM

## 2017-05-12 DIAGNOSIS — I636 Cerebral infarction due to cerebral venous thrombosis, nonpyogenic: Secondary | ICD-10-CM | POA: Diagnosis not present

## 2017-05-12 DIAGNOSIS — J9601 Acute respiratory failure with hypoxia: Secondary | ICD-10-CM | POA: Diagnosis present

## 2017-05-12 DIAGNOSIS — G9341 Metabolic encephalopathy: Secondary | ICD-10-CM | POA: Diagnosis present

## 2017-05-12 DIAGNOSIS — E7800 Pure hypercholesterolemia, unspecified: Secondary | ICD-10-CM

## 2017-05-12 DIAGNOSIS — E1165 Type 2 diabetes mellitus with hyperglycemia: Secondary | ICD-10-CM | POA: Diagnosis present

## 2017-05-12 DIAGNOSIS — Z7982 Long term (current) use of aspirin: Secondary | ICD-10-CM

## 2017-05-12 DIAGNOSIS — E860 Dehydration: Secondary | ICD-10-CM | POA: Diagnosis not present

## 2017-05-12 DIAGNOSIS — R0902 Hypoxemia: Secondary | ICD-10-CM

## 2017-05-12 DIAGNOSIS — I6330 Cerebral infarction due to thrombosis of unspecified cerebral artery: Secondary | ICD-10-CM

## 2017-05-12 DIAGNOSIS — E785 Hyperlipidemia, unspecified: Secondary | ICD-10-CM | POA: Diagnosis not present

## 2017-05-12 DIAGNOSIS — R9431 Abnormal electrocardiogram [ECG] [EKG]: Secondary | ICD-10-CM

## 2017-05-12 DIAGNOSIS — I110 Hypertensive heart disease with heart failure: Secondary | ICD-10-CM | POA: Diagnosis present

## 2017-05-12 DIAGNOSIS — E1159 Type 2 diabetes mellitus with other circulatory complications: Secondary | ICD-10-CM

## 2017-05-12 DIAGNOSIS — N390 Urinary tract infection, site not specified: Secondary | ICD-10-CM | POA: Diagnosis not present

## 2017-05-12 DIAGNOSIS — R6510 Systemic inflammatory response syndrome (SIRS) of non-infectious origin without acute organ dysfunction: Secondary | ICD-10-CM | POA: Diagnosis present

## 2017-05-12 DIAGNOSIS — E119 Type 2 diabetes mellitus without complications: Secondary | ICD-10-CM | POA: Diagnosis not present

## 2017-05-12 DIAGNOSIS — Z9119 Patient's noncompliance with other medical treatment and regimen: Secondary | ICD-10-CM

## 2017-05-12 DIAGNOSIS — G4733 Obstructive sleep apnea (adult) (pediatric): Secondary | ICD-10-CM

## 2017-05-12 DIAGNOSIS — Z993 Dependence on wheelchair: Secondary | ICD-10-CM

## 2017-05-12 DIAGNOSIS — N19 Unspecified kidney failure: Secondary | ICD-10-CM

## 2017-05-12 DIAGNOSIS — G464 Cerebellar stroke syndrome: Secondary | ICD-10-CM | POA: Diagnosis not present

## 2017-05-12 DIAGNOSIS — I2510 Atherosclerotic heart disease of native coronary artery without angina pectoris: Secondary | ICD-10-CM | POA: Diagnosis not present

## 2017-05-12 DIAGNOSIS — I69398 Other sequelae of cerebral infarction: Secondary | ICD-10-CM

## 2017-05-12 DIAGNOSIS — I69954 Hemiplegia and hemiparesis following unspecified cerebrovascular disease affecting left non-dominant side: Secondary | ICD-10-CM | POA: Diagnosis not present

## 2017-05-12 DIAGNOSIS — I5033 Acute on chronic diastolic (congestive) heart failure: Secondary | ICD-10-CM | POA: Diagnosis present

## 2017-05-12 DIAGNOSIS — R748 Abnormal levels of other serum enzymes: Secondary | ICD-10-CM

## 2017-05-12 DIAGNOSIS — Z7902 Long term (current) use of antithrombotics/antiplatelets: Secondary | ICD-10-CM

## 2017-05-12 DIAGNOSIS — I10 Essential (primary) hypertension: Secondary | ICD-10-CM

## 2017-05-12 DIAGNOSIS — J9811 Atelectasis: Secondary | ICD-10-CM | POA: Diagnosis not present

## 2017-05-12 DIAGNOSIS — I69393 Ataxia following cerebral infarction: Secondary | ICD-10-CM

## 2017-05-12 DIAGNOSIS — E870 Hyperosmolality and hypernatremia: Secondary | ICD-10-CM | POA: Diagnosis not present

## 2017-05-12 DIAGNOSIS — R531 Weakness: Secondary | ICD-10-CM | POA: Diagnosis not present

## 2017-05-12 DIAGNOSIS — Z79899 Other long term (current) drug therapy: Secondary | ICD-10-CM

## 2017-05-12 DIAGNOSIS — I4581 Long QT syndrome: Secondary | ICD-10-CM | POA: Diagnosis not present

## 2017-05-12 DIAGNOSIS — N289 Disorder of kidney and ureter, unspecified: Secondary | ICD-10-CM | POA: Diagnosis not present

## 2017-05-12 DIAGNOSIS — R0602 Shortness of breath: Secondary | ICD-10-CM | POA: Diagnosis not present

## 2017-05-12 DIAGNOSIS — A4151 Sepsis due to Escherichia coli [E. coli]: Principal | ICD-10-CM | POA: Diagnosis present

## 2017-05-12 DIAGNOSIS — D72825 Bandemia: Secondary | ICD-10-CM | POA: Diagnosis not present

## 2017-05-12 DIAGNOSIS — I5030 Unspecified diastolic (congestive) heart failure: Secondary | ICD-10-CM | POA: Diagnosis not present

## 2017-05-12 DIAGNOSIS — I252 Old myocardial infarction: Secondary | ICD-10-CM

## 2017-05-12 DIAGNOSIS — I272 Pulmonary hypertension, unspecified: Secondary | ICD-10-CM | POA: Diagnosis present

## 2017-05-12 DIAGNOSIS — I63442 Cerebral infarction due to embolism of left cerebellar artery: Secondary | ICD-10-CM | POA: Diagnosis not present

## 2017-05-12 DIAGNOSIS — Z951 Presence of aortocoronary bypass graft: Secondary | ICD-10-CM

## 2017-05-12 DIAGNOSIS — M6281 Muscle weakness (generalized): Secondary | ICD-10-CM | POA: Diagnosis not present

## 2017-05-12 DIAGNOSIS — R69 Illness, unspecified: Secondary | ICD-10-CM | POA: Diagnosis not present

## 2017-05-12 DIAGNOSIS — N179 Acute kidney failure, unspecified: Secondary | ICD-10-CM

## 2017-05-12 DIAGNOSIS — I639 Cerebral infarction, unspecified: Secondary | ICD-10-CM | POA: Diagnosis not present

## 2017-05-12 DIAGNOSIS — R262 Difficulty in walking, not elsewhere classified: Secondary | ICD-10-CM | POA: Diagnosis not present

## 2017-05-12 DIAGNOSIS — I63012 Cerebral infarction due to thrombosis of left vertebral artery: Secondary | ICD-10-CM

## 2017-05-12 DIAGNOSIS — R0989 Other specified symptoms and signs involving the circulatory and respiratory systems: Secondary | ICD-10-CM | POA: Diagnosis not present

## 2017-05-12 DIAGNOSIS — D72829 Elevated white blood cell count, unspecified: Secondary | ICD-10-CM | POA: Diagnosis present

## 2017-05-12 DIAGNOSIS — R7989 Other specified abnormal findings of blood chemistry: Secondary | ICD-10-CM | POA: Diagnosis present

## 2017-05-12 DIAGNOSIS — R269 Unspecified abnormalities of gait and mobility: Secondary | ICD-10-CM

## 2017-05-12 DIAGNOSIS — R778 Other specified abnormalities of plasma proteins: Secondary | ICD-10-CM | POA: Diagnosis present

## 2017-05-12 LAB — CBC WITH DIFFERENTIAL/PLATELET
BASOS ABS: 0 10*3/uL (ref 0.0–0.1)
BASOS PCT: 0 %
Band Neutrophils: 2 %
EOS ABS: 0 10*3/uL (ref 0.0–0.7)
EOS PCT: 0 %
HCT: 40.3 % (ref 36.0–46.0)
HEMOGLOBIN: 13.5 g/dL (ref 12.0–15.0)
Lymphocytes Relative: 4 %
Lymphs Abs: 0.8 10*3/uL (ref 0.7–4.0)
MCH: 30.6 pg (ref 26.0–34.0)
MCHC: 33.5 g/dL (ref 30.0–36.0)
MCV: 91.4 fL (ref 78.0–100.0)
MONO ABS: 0.2 10*3/uL (ref 0.1–1.0)
MYELOCYTES: 2 %
Metamyelocytes Relative: 2 %
Monocytes Relative: 1 %
Neutro Abs: 19.6 10*3/uL — ABNORMAL HIGH (ref 1.7–7.7)
Neutrophils Relative %: 89 %
Platelets: 225 10*3/uL (ref 150–400)
RBC: 4.41 MIL/uL (ref 3.87–5.11)
RDW: 15.6 % — ABNORMAL HIGH (ref 11.5–15.5)
WBC: 20.6 10*3/uL — ABNORMAL HIGH (ref 4.0–10.5)

## 2017-05-12 LAB — COMPREHENSIVE METABOLIC PANEL
ALBUMIN: 3.1 g/dL — AB (ref 3.5–5.0)
ALK PHOS: 107 U/L (ref 38–126)
ALT: 36 U/L (ref 14–54)
AST: 32 U/L (ref 15–41)
Anion gap: 18 — ABNORMAL HIGH (ref 5–15)
BILIRUBIN TOTAL: 0.7 mg/dL (ref 0.3–1.2)
BUN: 212 mg/dL — AB (ref 6–20)
CO2: 22 mmol/L (ref 22–32)
Calcium: 9.6 mg/dL (ref 8.9–10.3)
Chloride: 110 mmol/L (ref 101–111)
Creatinine, Ser: 4.39 mg/dL — ABNORMAL HIGH (ref 0.44–1.00)
GFR calc Af Amer: 11 mL/min — ABNORMAL LOW (ref >60)
GFR calc non Af Amer: 9 mL/min — ABNORMAL LOW (ref >60)
GLUCOSE: 179 mg/dL — AB (ref 65–99)
POTASSIUM: 4.6 mmol/L (ref 3.5–5.1)
Sodium: 150 mmol/L — ABNORMAL HIGH (ref 135–145)
TOTAL PROTEIN: 7.8 g/dL (ref 6.5–8.1)

## 2017-05-12 LAB — BRAIN NATRIURETIC PEPTIDE: B Natriuretic Peptide: 59.1 pg/mL (ref 0.0–100.0)

## 2017-05-12 LAB — CBG MONITORING, ED: GLUCOSE-CAPILLARY: 162 mg/dL — AB (ref 65–99)

## 2017-05-12 LAB — CK: CK TOTAL: 505 U/L — AB (ref 38–234)

## 2017-05-12 LAB — TROPONIN I: Troponin I: 0.05 ng/mL (ref <0.03)

## 2017-05-12 LAB — LACTIC ACID, PLASMA: LACTIC ACID, VENOUS: 1.1 mmol/L (ref 0.5–1.9)

## 2017-05-12 LAB — SODIUM, URINE, RANDOM: SODIUM UR: 19 mmol/L

## 2017-05-12 LAB — CREATININE, URINE, RANDOM: Creatinine, Urine: 83.12 mg/dL

## 2017-05-12 MED ORDER — SODIUM CHLORIDE 0.9 % IV BOLUS
1000.0000 mL | Freq: Once | INTRAVENOUS | Status: AC
  Administered 2017-05-12: 1000 mL via INTRAVENOUS

## 2017-05-12 MED ORDER — ASPIRIN 81 MG PO CHEW
324.0000 mg | CHEWABLE_TABLET | Freq: Once | ORAL | Status: AC
Start: 2017-05-12 — End: 2017-05-12
  Administered 2017-05-12: 324 mg via ORAL
  Filled 2017-05-12: qty 4

## 2017-05-12 NOTE — ED Notes (Signed)
Bed: WA08 Expected date: 05/12/17 Expected time: 6:56 PM Means of arrival: Ambulance Comments: Weakness, unable to walk x 4 days

## 2017-05-12 NOTE — ED Provider Notes (Signed)
[facility]-EMERGENCY DEPT Provider Note   CSN: [id number] Arrival date & time: 05/12/17  1854     History   Chief Complaint Chief Complaint  Patient presents with  . Weakness    HPI Megan Atkinson is a 67 y.o. female.  HPI  67 year old female with a history of coronary disease, diabetes, and stroke last year presents with weakness.  She feels diffusely weak, and cannot stand on her legs because they are so weak.  This has been progressive for over 1 week.  She usually walks with a walker but is unable to do so.  She has been having shortness of breath on exertion as well.  She has had decreased p.o. intake because she cannot go get food and drink.  She did drink a shake and some water today.  She denies any pain including no headache, chest pain, abdominal pain.  She has chronic leg swelling and is not sure of her legs are more swollen than typical.  She denies any urinary symptoms.  No cough.  She denies back pain. Has been using her wheelchair.   Past Medical History:  Diagnosis Date  . CAD (coronary artery disease)    small NSTEMI 11/12: LHC 01/06/11:  LAD 90-95%, mD1 90%, pD2 50%,  pOM1 50%, RCA occluded,  EF > 65%;  CABG  12/12: L-LAD  . Diabetes mellitus   . Glucose intolerance (impaired glucose tolerance)    Hemoglobin A1c 6.4 in 12/2010  . HTN (hypertension)    Echo 01/06/11: EF 65%  . Hyperlipidemia   . Obesity   . Stroke [facility]) 03/06/2016    Patient Active Problem List   Diagnosis Date Noted  . AKI (acute kidney injury) (HCC) 05/12/2017  . Elevated troponin 05/12/2017  . Dehydration 05/12/2017  . Hypernatremia 05/12/2017  . Elevated CK 05/12/2017  . Prolonged QT interval 05/12/2017  . Acute blood loss anemia   . Elevated serum creatinine   . Slow transit constipation   . Benign essential HTN   . Generalized anxiety disorder   . Gait disturbance, post-stroke 03/13/2016  . Cerebellar stroke, acute (HCC) 03/10/2016  . Super obese   .  Prediabetes   . Ataxia, post-stroke   . Acute cystitis without hematuria   . CVA (cerebral vascular accident) (HCC) 03/06/2016  . Morbid obesity (HCC) 02/29/2016  . OSA (obstructive sleep apnea) 11/24/2014  . Dyspnea 10/02/2014  . Anemia 04/05/2011  . Urinary tract infection, site not specified 04/05/2011  . Diabetes mellitus (HCC) 02/22/2011  . Hyperlipidemia 02/22/2011  . CAD (coronary artery disease) 01/07/2011  . Hypertension 01/07/2011    Past Surgical History:  Procedure Laterality Date  . ABDOMINAL HYSTERECTOMY    . APPENDECTOMY    . CORONARY ARTERY BYPASS GRAFT  01/12/2011   Procedure: CORONARY ARTERY BYPASS GRAFTING (CABG);  Surgeon: Alleen Borne, MD;  Location: [facility] OR;  Service: Open Heart Surgery;  Laterality: N/A;  coronary artery bypass graft times one using left internal mammary artery . Attempted endoscopic saphenous vein harvest  . LEFT HEART CATHETERIZATION WITH CORONARY ANGIOGRAM N/A 01/06/2011   Procedure: LEFT HEART CATHETERIZATION WITH CORONARY ANGIOGRAM;  Surgeon: Rollene Rotunda, MD;  Location: [facility] CATH LAB;  Service: Cardiovascular;  Laterality: N/A;     OB History   None      Home Medications    Prior to Admission medications   Medication Sig Start Date End Date Taking? Authorizing Provider  amLODipine (NORVASC) 2.5 MG tablet Take 1 tablet (2.5 mg total)  by mouth daily. 12/07/16   Lewayne Bunting, MD  aspirin 325 MG tablet Take 1 tablet (325 mg total) by mouth daily. 03/31/16   Love, Evlyn Kanner, PA-C  atorvastatin (LIPITOR) 80 MG tablet TAKE 1 TABLET BY MOUTH ONCE DAILY AT  6  PM 12/07/16   Lewayne Bunting, MD  Calcium Carbonate-Vitamin D (CALCIUM-VITAMIN D) 500-200 MG-UNIT tablet Take 1 tablet by mouth daily.    [provider]  clopidogrel (PLAVIX) 75 MG tablet Take 1 tablet (75 mg total) by mouth daily. 12/07/16   Lewayne Bunting, MD  Cyanocobalamin (VITAMIN B-12 IJ) weekly    [provider]  fluticasone (FLONASE) 50 MCG/ACT nasal  spray Place 1 spray into both nostrils daily. 04/01/16   Love, Evlyn Kanner, PA-C  metoprolol tartrate (LOPRESSOR) 25 MG tablet TAKE 1/2 (ONE-HALF) TABLET BY MOUTH TWICE DAILY 12/07/16   Lewayne Bunting, MD  senna-docusate (SENOKOT-S) 8.6-50 MG tablet Take 2 tablets by mouth 2 (two) times daily. 03/31/16   Jacquelynn Cree, PA-C    Family History Family History  Problem Relation Age of Onset  . Coronary artery disease Unknown        No family history  . Heart disease Mother   . Heart disease Father   . Heart disease Maternal Grandmother     Social History Social History   Tobacco Use  . Smoking status: Never Smoker  . Smokeless tobacco: Never Used  Substance Use Topics  . Alcohol use: Yes    Comment: Occasional  . Drug use: No     Allergies   Lisinopril and Metformin and related   Review of Systems Review of Systems  Constitutional: Negative for fever.  Respiratory: Positive for shortness of breath. Negative for cough.   Cardiovascular: Positive for leg swelling. Negative for chest pain.  Gastrointestinal: Negative for abdominal pain, diarrhea and vomiting.  Genitourinary: Negative for dysuria.  Neurological: Positive for weakness. Negative for headaches.  All other systems reviewed and are negative.    Physical Exam Updated Vital Signs BP (!) 145/59 (BP Location: Left Arm)   Pulse 72   Temp (!) 96 F (35.6 C) (Rectal)   Resp 17   Ht 5\' 7"  (1.702 m)   Wt (!) 162.4 kg (358 lb)   SpO2 98%   BMI 56.07 kg/m   Physical Exam  Constitutional: She is oriented to person, place, and time. She appears well-developed and well-nourished. No distress.  Morbidly obese  HENT:  Head: Normocephalic and atraumatic.  Right Ear: External ear normal.  Left Ear: External ear normal.  Nose: Nose normal.  Eyes: Right eye exhibits no discharge. Left eye exhibits no discharge.  Cardiovascular: Normal rate, regular rhythm and normal heart sounds.  Pulmonary/Chest: Effort normal. No  accessory muscle usage. No tachypnea. She has decreased breath sounds in the right lower field.  Abdominal: Soft. There is no tenderness.  Musculoskeletal:  No obvious pitting edema in extremities. It is difficult to appreciate if there is swelling at all given how large her legs are  Neurological: She is alert and oriented to person, place, and time.  No facial droop or slurred speech. 5/5 strength in BUE. Unable to lift legs off stretcher but can move side to side  Skin: Skin is warm and dry. She is not diaphoretic.  Nursing note and vitals reviewed.    ED Treatments / Results  Labs (all labs ordered are listed, but only abnormal results are displayed) Labs Reviewed  COMPREHENSIVE METABOLIC PANEL -  Abnormal; Notable for the following components:      Result Value   Sodium 150 (*)    Glucose, Bld 179 (*)    BUN 212 (*)    Creatinine, Ser 4.39 (*)    Albumin 3.1 (*)    GFR calc non Af Amer 9 (*)    GFR calc Af Amer 11 (*)    Anion gap 18 (*)    All other components within normal limits  TROPONIN I - Abnormal; Notable for the following components:   Troponin I 0.05 (*)    All other components within normal limits  CBC WITH DIFFERENTIAL/PLATELET - Abnormal; Notable for the following components:   WBC 20.6 (*)    RDW 15.6 (*)    Neutro Abs 19.6 (*)    All other components within normal limits  CK - Abnormal; Notable for the following components:   Total CK 505 (*)    All other components within normal limits  CBG MONITORING, ED - Abnormal; Notable for the following components:   Glucose-Capillary 162 (*)    All other components within normal limits  BRAIN NATRIURETIC PEPTIDE  URINALYSIS, ROUTINE W REFLEX MICROSCOPIC  LACTIC ACID, PLASMA  SODIUM, URINE, RANDOM  CREATININE, URINE, RANDOM  OSMOLALITY, URINE  OSMOLALITY  MAGNESIUM  PHOSPHORUS    EKG EKG Interpretation  Date/Time:  Saturday May 12 2017 19:57:36 EDT Ventricular Rate:  80 PR Interval:    QRS  Duration: 109 QT Interval:  456 QTC Calculation: 527 R Axis:   -50 Text Interpretation:  Sinus rhythm Probable left ventricular hypertrophy Inferior infarct, old Prolonged QT interval Baseline wander in lead(s) V6 artifact limits interpretation Confirmed by Pricilla LovelessGoldston, Jeree Delcid [phone]([id number]) on 05/12/2017 8:02:21 PM   Radiology Dg Chest 2 View  Result Date: 05/12/2017 CLINICAL DATA:  Weakness and fatigue for 4 days. History of stroke, hypertension, diabetes, coronary artery disease. Nonsmoker. EXAM: CHEST - 2 VIEW COMPARISON:  02/06/2011 FINDINGS: Postoperative changes in the mediastinum. Mild cardiac enlargement. No vascular congestion or edema. Shallow inspiration with elevation of the right hemidiaphragm and linear atelectasis in the right lung base. No blunting of costophrenic angles. No pneumothorax. Mediastinal contours appear intact. Calcification of the aorta. IMPRESSION: Cardiac enlargement. Elevated right hemidiaphragm with atelectasis in the right lung base. No focal consolidation. Aortic atherosclerosis. Electronically Signed   By: Burman NievesWilliam  Stevens M.D.   On: 05/12/2017 21:03    Procedures Procedures (including critical care time)  Medications Ordered in ED Medications  sodium chloride 0.9 % bolus 1,000 mL (1,000 mLs Intravenous New Bag/Given 05/12/17 2202)  aspirin chewable tablet 324 mg (has no administration in time range)     Initial Impression / Assessment and Plan / ED Course  I have reviewed the triage vital signs and the nursing notes.  Pertinent labs & imaging results that were available during my care of the patient were reviewed by me and considered in my medical decision making (see chart for details).     Patient's lab work is pertinent for significant acute renal failure with a new creatinine of 4.4.  BUN is significantly elevated at 212.  Clinically she does not appear uremic and is awake and alert and appropriate.  Low level troponin elevation probably not MI given no CP  along with renal failure. Vital signs are otherwise unremarkable besides mild hypothermia and moderate hypertension.  She will be given IV fluids.  Urinalysis currently pending.  Unclear where her white blood cell count is coming from unless it is from a  UTI.  She does not have any cough or obvious pneumonia.  Discussed with Dr. Adela Glimpse, will admit to the hospitalist service.  Final Clinical Impressions(s) / ED Diagnoses   Final diagnoses:  Acute kidney injury [facility])  Acute uremia    ED Discharge Orders    None       Pricilla Loveless, MD 05/12/17 2236

## 2017-05-12 NOTE — ED Triage Notes (Addendum)
Pt to ED via [facility] with c/o Weakness/fatigue for the last 4 days. Pt was found in wheelchair downstairs and could not get up and walk upstairs as she usually can per family. Pt is 300 lbs and A&O x4 and hx HTN. Family per EMS says she hasn't taken her BP medicines for past 4 days, patient lives alone  EMS Vitals  130 blood pressure palpated HR 66 RR 16 95% RA

## 2017-05-12 NOTE — H&P (Addendum)
Megan Atkinson ZOX:[id number] DOB: 10/13/1950 DOA: 05/12/2017     PCP: Anne Ng, NP   Outpatient Specialists:   CARDS:   Dr. Jens Som  [facility] [place]   Patient arrived to ER on 05/12/17 at 1854  Patient coming from:  home Lives alone,     Chief Complaint:  Chief Complaint  Patient presents with  . Weakness    HPI: Megan Atkinson is a 67 y.o. female with medical history significant of HTN, morbid obesity, CAD, cerebellar CVA 2018, diastolic CHF, DM 2,     Presented with 4-day history of progressive weakness unable to ambulate patient has been using a walker at baseline but was found sitting in the wheelchair downstairs at her baseline she is usually able to walk up the stairs but for some reason was unable to. Patient reports she felt unwell and then developed significant fatigue all over.  Patient lives at home by herself   She did endorse some exertional shortness of breath have not eaten anything for the past 4 days, urinated into her brief.  She was too weak to get up or to push her wheelchair and was afraid to fall on the floor. Until today when her friend found her and gave her fluids and a shake  today.  No headache or chest pain no abdominal pain no urinary complaints no back pain. No fever.  Regarding pertinent Chronic problems: History of coronary artery disease history of MI in 2012 status post CABG on Plavix aspirin and statin  history of CVA cerebellar 2018   due to atherosclerosis and multiple risk factors   resulting in ataxia chronic has aspirin and Plavix long-term  in 2018 2D echo with eF 60-65% with mild to moderately calcified aortic valve and grade I diastolic dysfunction.   While in ER:   Patient noted to be profoundly dehydrated  Following Medications were ordered in ER: Medications  sodium chloride 0.9 % bolus 1,000 mL (1,000 mLs Intravenous New Bag/Given 05/12/17 2202)  aspirin chewable tablet 324 mg (has no administration in time range)    Feeling a bit better once was able to drink and had fluids.  Significant initial  Findings: Abnormal Labs Reviewed  COMPREHENSIVE METABOLIC PANEL - Abnormal; Notable for the following components:      Result Value   Sodium 150 (*)    Glucose, Bld 179 (*)    BUN 212 (*)    Creatinine, Ser 4.39 (*)    Albumin 3.1 (*)    GFR calc non Af Amer 9 (*)    GFR calc Af Amer 11 (*)    Anion gap 18 (*)    All other components within normal limits  TROPONIN I - Abnormal; Notable for the following components:   Troponin I 0.05 (*)    All other components within normal limits  CBC WITH DIFFERENTIAL/PLATELET - Abnormal; Notable for the following components:   WBC 20.6 (*)    RDW 15.6 (*)    Neutro Abs 19.6 (*)    All other components within normal limits  CK - Abnormal; Notable for the following components:   Total CK 505 (*)    All other components within normal limits  CBG MONITORING, ED - Abnormal; Notable for the following components:   Glucose-Capillary 162 (*)    All other components within normal limits      K 4.6  Cr  Up from baseline see below Lab Results  Component Value Date   CREATININE 4.39 (H)  05/12/2017   CREATININE 0.85 03/31/2016   CREATININE 0.83 03/24/2016      HG/HCT  Up from baseline see below    Component Value Date/Time   HGB 13.5 05/12/2017 2023   HCT 40.3 05/12/2017 2023     Troponin 0.05  BNP (last 3 results) Recent Labs    05/12/17 2023  BNP 59.1     Lactic Acid, Venous No results found for: LATICACIDVEN    UA   ordered  CT HEAD ordered  CXR -  NON acute, cardiomegaly , atelectasis    ECG:  Personally reviewed by me showing: HR : 80 Rhythm: NSR,     no evidence of ischemic changes QTC 527    ED Triage Vitals  Enc Vitals Atkinson     BP 05/12/17 1931 (!) 145/59     Pulse Rate 05/12/17 1931 79     Resp 05/12/17 2134 17     Temp 05/12/17 2014 (!) 96 F (35.6 C)     Temp Source 05/12/17 2014 Rectal     SpO2 05/12/17 1931 95 %      Weight 05/12/17 1929 (!) 358 lb (162.4 kg)     Height 05/12/17 1929 5\' 7"  (1.702 m)     Head Circumference --      Peak Flow --      Pain Score 05/12/17 1929 3     Pain Loc --      Pain Edu? --      Excl. in GC? --   TMAX(24)@       Latest  Blood pressure (!) 145/59, pulse 72, temperature (!) 96 F (35.6 C), temperature source Rectal, resp. rate 17, height 5\' 7"  (1.702 m), weight (!) 162.4 kg (358 lb), SpO2 98 %.    Hospitalist was called for admission for dehydration resulting in acute renal failure   Review of Systems:    Pertinent positives include:  Fatigue,  shortness of breath at rest.   Constitutional:  No weight loss, night sweats, Fevers, chills, weight loss  HEENT:  No headaches, Difficulty swallowing,Tooth/dental problems,Sore throat,  No sneezing, itching, ear ache, nasal congestion, post nasal drip,  Cardio-vascular:  No chest pain, Orthopnea, PND, anasarca, dizziness, palpitations.no Bilateral lower extremity swelling  GI:  No heartburn, indigestion, abdominal pain, nausea, vomiting, diarrhea, change in bowel habits, loss of appetite, melena, blood in stool, hematemesis Resp:  no No dyspnea on exertion, No excess mucus, no productive cough, No non-productive cough, No coughing up of blood.No change in color of mucus.No wheezing. Skin:  no rash or lesions. No jaundice GU:  no dysuria, change in color of urine, no urgency or frequency. No straining to urinate.  No flank pain.  Musculoskeletal:  No joint pain or no joint swelling. No decreased range of motion. No back pain.  Psych:  No change in mood or affect. No depression or anxiety. No memory loss.  Neuro: no localizing neurological complaints, no tingling, no weakness, no double vision, no gait abnormality, no slurred speech, no confusion  As per HPI otherwise 10 point review of systems negative.   Past Medical History:   Past Medical History:  Diagnosis Date  . CAD (coronary artery disease)     small NSTEMI 11/12: LHC 01/06/11:  LAD 90-95%, mD1 90%, pD2 50%,  pOM1 50%, RCA occluded,  EF > 65%;  CABG  12/12: L-LAD  . Diabetes mellitus   . Glucose intolerance (impaired glucose tolerance)    Hemoglobin A1c 6.4 in 12/2010  .  HTN (hypertension)    Echo 01/06/11: EF 65%  . Hyperlipidemia   . Obesity   . Stroke [facility](HCC) 03/06/2016      Past Surgical History:  Procedure Laterality Date  . ABDOMINAL HYSTERECTOMY    . APPENDECTOMY    . CORONARY ARTERY BYPASS GRAFT  01/12/2011   Procedure: CORONARY ARTERY BYPASS GRAFTING (CABG);  Surgeon: Alleen BorneBryan K [name], MD;  [facility];  Service: Open Heart Surgery;  Laterality: N/A;  coronary artery bypass graft times one using left internal mammary artery . Attempted endoscopic saphenous vein harvest  . LEFT HEART CATHETERIZATION WITH CORONARY ANGIOGRAM N/A 01/06/2011   Procedure: LEFT HEART CATHETERIZATION WITH CORONARY ANGIOGRAM;  Surgeon: Rollene RotundaJames Hochrein, MD;  [facility];  Service: Cardiovascular;  Laterality: N/A;    Social History:  Ambulatory   walker or  wheelchair bound,       reports that she has never smoked. She has never used smokeless tobacco. She reports that she drinks alcohol. She reports that she does not use drugs.    Family History:   Family History  Problem Relation Age of Onset  . Coronary artery disease Unknown        No family history  . Heart disease Mother   . Heart disease Father   . Heart disease Maternal Grandmother     Allergies: Allergies  Allergen Reactions  . Lisinopril     cough  . Metformin And Related Diarrhea    A lot of fluid loss from bowel and diarrhea     Prior to Admission medications   Medication Sig Start Date End Date Taking? Authorizing Provider  amLODipine (NORVASC) 2.5 MG tablet Take 1 tablet (2.5 mg total) by mouth daily. 12/07/16   Lewayne Buntingrenshaw, Brian S, MD  aspirin 325 MG tablet Take 1 tablet (325 mg total) by mouth daily. 03/31/16   Love, Evlyn KannerPamela S, PA-C  atorvastatin  (LIPITOR) 80 MG tablet TAKE 1 TABLET BY MOUTH ONCE DAILY AT  6  PM 12/07/16   Lewayne Buntingrenshaw, Brian S, MD  Calcium Carbonate-Vitamin D (CALCIUM-VITAMIN D) 500-200 MG-UNIT tablet Take 1 tablet by mouth daily.    [provider]  clopidogrel (PLAVIX) 75 MG tablet Take 1 tablet (75 mg total) by mouth daily. 12/07/16   Lewayne Buntingrenshaw, Brian S, MD  Cyanocobalamin (VITAMIN B-12 IJ) weekly    [provider]  fluticasone (FLONASE) 50 MCG/ACT nasal spray Place 1 spray into both nostrils daily. 04/01/16   Love, Evlyn KannerPamela S, PA-C  metoprolol tartrate (LOPRESSOR) 25 MG tablet TAKE 1/2 (ONE-HALF) TABLET BY MOUTH TWICE DAILY 12/07/16   Lewayne Buntingrenshaw, Brian S, MD  senna-docusate (SENOKOT-S) 8.6-50 MG tablet Take 2 tablets by mouth 2 (two) times daily. 03/31/16   Jacquelynn CreeLove, Pamela S, PA-C   Physical Exam: Blood pressure (!) 145/59, pulse 72, temperature (!) 96 F (35.6 C), temperature source Rectal, resp. rate 17, height 5\' 7"  (1.702 m), weight (!) 162.4 kg (358 lb), SpO2 98 %. 1. General:  in No Acute distress  Chronically ill  -appearing 2. Psychological: Alert and   Oriented 3. Head/ENT:    Dry Mucous Membranes                          Head Non traumatic, neck supple                           Poor Dentition 4. SKIN:  decreased Skin turgor,  Skin clean  Dry and intact no rash 5. Heart: Regular rate and rhythm no Murmur, no Rub or gallop 6. Lungs:  no wheezes or crackles   7. Abdomen: Soft,  non-tender, Non distended   obese  bowel sounds present 8. Lower extremities: no clubbing, cyanosis, or edema 9. Neurologically   strength 5 out of 5 in upper extremities cranial nerves II through XII intact, Lower extremities diffusely weak equally bilaterally, morbidly obese 10. MSK: Normal range of motion   LABS:     Recent Labs  Lab 05/12/17 2023  WBC 20.6*  NEUTROABS 19.6*  HGB 13.5  HCT 40.3  MCV 91.4  PLT 225   Basic Metabolic Panel: Recent Labs  Lab 05/12/17 2023  NA 150*  K 4.6  CL 110  CO2 22    GLUCOSE 179*  BUN 212*  CREATININE 4.39*  CALCIUM 9.6      Recent Labs  Lab 05/12/17 2023  AST 32  ALT 36  ALKPHOS 107  BILITOT 0.7  PROT 7.8  ALBUMIN 3.1*   No results for input(s): LIPASE, AMYLASE in the last 168 hours. No results for input(s): AMMONIA in the last 168 hours.    HbA1C: No results for input(s): HGBA1C in the last 72 hours. CBG: Recent Labs  Lab 05/12/17 2035  GLUCAP 162*         Cultures:    Component Value Date/Time   SDES URINE, CLEAN CATCH 03/10/2016 2037   SPECREQUEST NONE 03/10/2016 2037   CULT >=100,000 COLONIES/mL ESCHERICHIA COLI (A) 03/10/2016 2037   REPTSTATUS 03/14/2016 FINAL 03/10/2016 2037     Radiological Exams on Admission: Dg Chest 2 View  Result Date: 05/12/2017 CLINICAL DATA:  Weakness and fatigue for 4 days. History of stroke, hypertension, diabetes, coronary artery disease. Nonsmoker. EXAM: CHEST - 2 VIEW COMPARISON:  02/06/2011 FINDINGS: Postoperative changes in the mediastinum. Mild cardiac enlargement. No vascular congestion or edema. Shallow inspiration with elevation of the right hemidiaphragm and linear atelectasis in the right lung base. No blunting of costophrenic angles. No pneumothorax. Mediastinal contours appear intact. Calcification of the aorta. IMPRESSION: Cardiac enlargement. Elevated right hemidiaphragm with atelectasis in the right lung base. No focal consolidation. Aortic atherosclerosis. Electronically Signed   By: Burman Nieves M.D.   On: 05/12/2017 21:03    Chart has been reviewed    Assessment/Plan  67 y.o. female with medical history significant of HTN, morbid obesity, CAD, cerebellar CVA 2018, diastolic CHF, DM 2,   Admitted for dehydration resulting in acute renal failure and hypernatremia  Present on Admission: . AKI (acute kidney injury) (HCC) -- likely secondary to dehydration, check FeNA and if not improved with IVF would obtain renal US and consider renal consult.   . OSA (obstructive  sleep apnea) she states she does not tolerate CPAP . Morbid obesity (HCC) -BMI 56 would benefit from long-term nutritional assessment and follow-up as an outpatient . Hypertension  -currently stable continue metoprolol and Norvasc . Hyperlipidemia -continue statin . CAD (coronary artery disease) -  - chronic, continue aspirin   Statin and beta blocker   . Elevated troponin no EKG changes or associated chest pain, suspect demand ischemia.  We will continue to cycle cardiac enzymes obtain echogram if upgoing trend will need cardiology consult . Dehydration secondary to no p.o. intake in the past 4 days we will rehydrate check electrolytes . Hypernatremia in the setting of dehydration and decreased access to water.  We will rehydrate and monitor if does not improve with rehydration and drinking  will need hypotonic fluids . Elevated CK -in the setting of decreased p.o. intake for the past 4 days will recheck . Prolonged QT interval- - will monitor on tele avoid QT prolonging medications, rehydrate correct electrolytes  . Leukocytosis -at this point no evidence of infectious process, no fevers or chills obtain UA to rule out UTI most likely hemoconcentration   Diffuse weakness -unsure what started the process but likely as patient became dehydrated she was no longer able to ambulate resulting in inability to access fluids and progressed to further   deterioration and kidney failure Inciting event could be infectious process UA pending given history of CVA resulting in ataxia will obtain CT of the head will have PT OT evaluate mobility issues  DM 2-baseline diet controlled, order Sensitive  SSI     -  check TSH and HgA1C   SIRS physiology with hypothermia and elevated white blood cell count unclear etiology at this time.  Monitor in stepdown. Lactic acid reassuring.  Await blood cultures and urine. For right now initiate empiric antibiotics and continue if blood cultures unremarkable and no evidence  of infection Other plan as per orders.  DVT prophylaxis:  SCD   Code Status:  FULL CODE  as per patient   I had personally discussed CODE STATUS with patient and   Family Communication:   Family not at  Bedside    Disposition Plan:     likely will need placement for rehabilitation                                               Would benefit from PT/OT eval prior to DC   Ordered                          Consults called: none Admission status:   Inpatient    Level of care    stepdwon          Armon Orvis 05/12/2017, 11:26 PM    [facility]  Pager [phone]   after 2 AM please page floor coverage PA If 7AM-7PM, please contact the day team taking care of the patient  [email or web address]  Password [email or web address]

## 2017-05-13 ENCOUNTER — Inpatient Hospital Stay (HOSPITAL_COMMUNITY): Payer: Medicare Other

## 2017-05-13 ENCOUNTER — Encounter (HOSPITAL_COMMUNITY): Payer: Self-pay | Admitting: *Deleted

## 2017-05-13 ENCOUNTER — Other Ambulatory Visit: Payer: Self-pay

## 2017-05-13 DIAGNOSIS — R6510 Systemic inflammatory response syndrome (SIRS) of non-infectious origin without acute organ dysfunction: Secondary | ICD-10-CM | POA: Diagnosis present

## 2017-05-13 DIAGNOSIS — N390 Urinary tract infection, site not specified: Secondary | ICD-10-CM | POA: Diagnosis present

## 2017-05-13 DIAGNOSIS — N19 Unspecified kidney failure: Secondary | ICD-10-CM

## 2017-05-13 DIAGNOSIS — I5030 Unspecified diastolic (congestive) heart failure: Secondary | ICD-10-CM

## 2017-05-13 LAB — COMPREHENSIVE METABOLIC PANEL
ALBUMIN: 2.5 g/dL — AB (ref 3.5–5.0)
ALT: 28 U/L (ref 14–54)
ANION GAP: 13 (ref 5–15)
AST: 25 U/L (ref 15–41)
Alkaline Phosphatase: 85 U/L (ref 38–126)
BUN: 206 mg/dL — ABNORMAL HIGH (ref 6–20)
CALCIUM: 8.9 mg/dL (ref 8.9–10.3)
CO2: 23 mmol/L (ref 22–32)
Chloride: 114 mmol/L — ABNORMAL HIGH (ref 101–111)
Creatinine, Ser: 3.79 mg/dL — ABNORMAL HIGH (ref 0.44–1.00)
GFR calc Af Amer: 13 mL/min — ABNORMAL LOW (ref >60)
GFR calc non Af Amer: 11 mL/min — ABNORMAL LOW (ref >60)
GLUCOSE: 155 mg/dL — AB (ref 65–99)
POTASSIUM: 4.3 mmol/L (ref 3.5–5.1)
SODIUM: 150 mmol/L — AB (ref 135–145)
Total Bilirubin: 0.9 mg/dL (ref 0.3–1.2)
Total Protein: 6.3 g/dL — ABNORMAL LOW (ref 6.5–8.1)

## 2017-05-13 LAB — PHOSPHORUS
Phosphorus: 8.7 mg/dL — ABNORMAL HIGH (ref 2.5–4.6)
Phosphorus: 8.5 mg/dL — ABNORMAL HIGH (ref 2.5–4.6)

## 2017-05-13 LAB — GLUCOSE, CAPILLARY
GLUCOSE-CAPILLARY: 112 mg/dL — AB (ref 65–99)
GLUCOSE-CAPILLARY: 120 mg/dL — AB (ref 65–99)
Glucose-Capillary: 373 mg/dL — ABNORMAL HIGH (ref 65–99)
Glucose-Capillary: 187 mg/dL — ABNORMAL HIGH (ref 65–99)

## 2017-05-13 LAB — BASIC METABOLIC PANEL
ANION GAP: 16 — AB (ref 5–15)
BUN: 217 mg/dL — ABNORMAL HIGH (ref 6–20)
CALCIUM: 8.8 mg/dL — AB (ref 8.9–10.3)
CO2: 19 mmol/L — ABNORMAL LOW (ref 22–32)
Chloride: 110 mmol/L (ref 101–111)
Creatinine, Ser: 3.94 mg/dL — ABNORMAL HIGH (ref 0.44–1.00)
GFR, EST AFRICAN AMERICAN: 12 mL/min — AB (ref >60)
GFR, EST NON AFRICAN AMERICAN: 11 mL/min — AB (ref >60)
Glucose, Bld: 214 mg/dL — ABNORMAL HIGH (ref 65–99)
POTASSIUM: 4.3 mmol/L (ref 3.5–5.1)
Sodium: 145 mmol/L (ref 135–145)

## 2017-05-13 LAB — URINALYSIS, ROUTINE W REFLEX MICROSCOPIC
BILIRUBIN URINE: NEGATIVE
Glucose, UA: NEGATIVE mg/dL
KETONES UR: NEGATIVE mg/dL
Nitrite: NEGATIVE
PH: 8 (ref 5.0–8.0)
Protein, ur: 100 mg/dL — AB
Specific Gravity, Urine: 1.014 (ref 1.005–1.030)

## 2017-05-13 LAB — TSH: TSH: 2.966 u[IU]/mL (ref 0.350–4.500)

## 2017-05-13 LAB — MAGNESIUM
MAGNESIUM: 2.9 mg/dL — AB (ref 1.7–2.4)
MAGNESIUM: 2.8 mg/dL — AB (ref 1.7–2.4)

## 2017-05-13 LAB — ECHOCARDIOGRAM COMPLETE
HEIGHTINCHES: 67 in
WEIGHTICAEL: 5952 [oz_av]

## 2017-05-13 LAB — CBC
HEMATOCRIT: 34.9 % — AB (ref 36.0–46.0)
Hemoglobin: 11.3 g/dL — ABNORMAL LOW (ref 12.0–15.0)
MCH: 29.7 pg (ref 26.0–34.0)
MCHC: 32.4 g/dL (ref 30.0–36.0)
MCV: 91.8 fL (ref 78.0–100.0)
Platelets: 208 10*3/uL (ref 150–400)
RBC: 3.8 MIL/uL — ABNORMAL LOW (ref 3.87–5.11)
RDW: 15.7 % — ABNORMAL HIGH (ref 11.5–15.5)
WBC: 17.4 10*3/uL — ABNORMAL HIGH (ref 4.0–10.5)

## 2017-05-13 LAB — OSMOLALITY, URINE: OSMOLALITY UR: 513 mosm/kg (ref 300–900)

## 2017-05-13 LAB — TROPONIN I
TROPONIN I: 0.03 ng/mL — AB (ref <0.03)
TROPONIN I: 0.04 ng/mL — AB (ref <0.03)
Troponin I: 0.03 ng/mL (ref <0.03)

## 2017-05-13 LAB — PROCALCITONIN: PROCALCITONIN: 7.72 ng/mL

## 2017-05-13 LAB — HEMOGLOBIN A1C
Hgb A1c MFr Bld: 8 % — ABNORMAL HIGH (ref 4.8–5.6)
Mean Plasma Glucose: 182.9 mg/dL

## 2017-05-13 LAB — OSMOLALITY: OSMOLALITY: 387 mosm/kg — AB (ref 275–295)

## 2017-05-13 LAB — MRSA PCR SCREENING: MRSA by PCR: NEGATIVE

## 2017-05-13 LAB — CK: Total CK: 331 U/L — ABNORMAL HIGH (ref 38–234)

## 2017-05-13 MED ORDER — ATORVASTATIN CALCIUM 80 MG PO TABS
80.0000 mg | ORAL_TABLET | Freq: Every day | ORAL | Status: DC
  Administered 2017-05-13 – 2017-05-18 (×6): 80 mg via ORAL
  Filled 2017-05-13: qty 2
  Filled 2017-05-13: qty 1
  Filled 2017-05-13: qty 2
  Filled 2017-05-13 (×2): qty 1
  Filled 2017-05-13: qty 2

## 2017-05-13 MED ORDER — ACETAMINOPHEN 650 MG RE SUPP: 650.0000 mg | Freq: Four times a day (QID) | RECTAL | Status: DC | PRN

## 2017-05-13 MED ORDER — ORAL CARE MOUTH RINSE
15.0000 mL | Freq: Two times a day (BID) | OROMUCOSAL | Status: DC
  Administered 2017-05-13 – 2017-05-18 (×10): 15 mL via OROMUCOSAL

## 2017-05-13 MED ORDER — AMLODIPINE BESYLATE 2.5 MG PO TABS
2.5000 mg | ORAL_TABLET | Freq: Every day | ORAL | Status: DC
  Administered 2017-05-13 – 2017-05-15 (×3): 2.5 mg via ORAL
  Filled 2017-05-13 (×3): qty 1

## 2017-05-13 MED ORDER — SODIUM CHLORIDE 0.9 % IV SOLN
INTRAVENOUS | Status: DC
  Administered 2017-05-13: 06:00:00 via INTRAVENOUS

## 2017-05-13 MED ORDER — METOPROLOL TARTRATE 12.5 MG HALF TABLET
12.5000 mg | ORAL_TABLET | Freq: Two times a day (BID) | ORAL | Status: DC
  Administered 2017-05-13 – 2017-05-16 (×6): 12.5 mg via ORAL
  Filled 2017-05-13 (×6): qty 1

## 2017-05-13 MED ORDER — HEPARIN SODIUM (PORCINE) 5000 UNIT/ML IJ SOLN
5000.0000 [IU] | Freq: Three times a day (TID) | INTRAMUSCULAR | Status: DC
  Administered 2017-05-13 – 2017-05-18 (×15): 5000 [IU] via SUBCUTANEOUS
  Filled 2017-05-13 (×14): qty 1

## 2017-05-13 MED ORDER — HYDROCODONE-ACETAMINOPHEN 5-325 MG PO TABS
1.0000 | ORAL_TABLET | ORAL | Status: DC | PRN
  Administered 2017-05-14 – 2017-05-18 (×5): 2 via ORAL
  Filled 2017-05-13 (×5): qty 2

## 2017-05-13 MED ORDER — VANCOMYCIN HCL IN DEXTROSE 1-5 GM/200ML-% IV SOLN: 1000.0000 mg | INTRAVENOUS | Status: DC

## 2017-05-13 MED ORDER — ASPIRIN EC 81 MG PO TBEC
81.0000 mg | DELAYED_RELEASE_TABLET | Freq: Every day | ORAL | Status: DC
  Administered 2017-05-13 – 2017-05-18 (×6): 81 mg via ORAL
  Filled 2017-05-13 (×6): qty 1

## 2017-05-13 MED ORDER — SODIUM CHLORIDE 0.45 % IV SOLN
INTRAVENOUS | Status: DC
  Administered 2017-05-13 – 2017-05-15 (×3): via INTRAVENOUS

## 2017-05-13 MED ORDER — ACETAMINOPHEN 325 MG PO TABS: 650.0000 mg | ORAL_TABLET | Freq: Four times a day (QID) | ORAL | Status: DC | PRN

## 2017-05-13 MED ORDER — CHLORHEXIDINE GLUCONATE 0.12 % MT SOLN
15.0000 mL | Freq: Two times a day (BID) | OROMUCOSAL | Status: DC
  Administered 2017-05-13 – 2017-05-18 (×9): 15 mL via OROMUCOSAL
  Filled 2017-05-13 (×9): qty 15

## 2017-05-13 MED ORDER — PIPERACILLIN-TAZOBACTAM 3.375 G IVPB 30 MIN
3.3750 g | Freq: Once | INTRAVENOUS | Status: AC
  Administered 2017-05-13: 3.375 g via INTRAVENOUS
  Filled 2017-05-13: qty 50

## 2017-05-13 MED ORDER — PERFLUTREN LIPID MICROSPHERE
1.0000 mL | INTRAVENOUS | Status: AC | PRN
  Filled 2017-05-13: qty 10

## 2017-05-13 MED ORDER — VANCOMYCIN HCL 10 G IV SOLR
2500.0000 mg | Freq: Once | INTRAVENOUS | Status: AC
  Administered 2017-05-13: 2500 mg via INTRAVENOUS
  Filled 2017-05-13: qty 2000

## 2017-05-13 MED ORDER — CLOPIDOGREL BISULFATE 75 MG PO TABS
75.0000 mg | ORAL_TABLET | Freq: Every day | ORAL | Status: DC
  Administered 2017-05-13 – 2017-05-18 (×6): 75 mg via ORAL
  Filled 2017-05-13 (×6): qty 1

## 2017-05-13 MED ORDER — PIPERACILLIN-TAZOBACTAM IN DEX 2-0.25 GM/50ML IV SOLN
2.2500 g | Freq: Four times a day (QID) | INTRAVENOUS | Status: DC
  Administered 2017-05-13 – 2017-05-14 (×4): 2.25 g via INTRAVENOUS
  Filled 2017-05-13 (×6): qty 50

## 2017-05-13 MED ORDER — VANCOMYCIN HCL IN DEXTROSE 1-5 GM/200ML-% IV SOLN: 1000.0000 mg | Freq: Once | INTRAVENOUS | Status: DC

## 2017-05-13 MED ORDER — INSULIN ASPART 100 UNIT/ML ~~LOC~~ SOLN
0.0000 [IU] | SUBCUTANEOUS | Status: DC
  Administered 2017-05-13: 2 [IU] via SUBCUTANEOUS
  Administered 2017-05-13: 9 [IU] via SUBCUTANEOUS
  Administered 2017-05-14 – 2017-05-15 (×6): 1 [IU] via SUBCUTANEOUS
  Administered 2017-05-15 (×2): 2 [IU] via SUBCUTANEOUS
  Administered 2017-05-16 (×2): 1 [IU] via SUBCUTANEOUS
  Administered 2017-05-16: 2 [IU] via SUBCUTANEOUS

## 2017-05-13 NOTE — ED Notes (Signed)
ED TO INPATIENT HANDOFF REPORT  Name/Age/Gender Megan Atkinson 67 y.o. female  Code Status Code Status History    Date Active Date Inactive Code Status Order ID Comments User Context   03/10/2016 1503 03/31/2016 1701 Full Code [id number]  Flora Lipps Inpatient   03/10/2016 1503 03/10/2016 1503 Full Code [id number]  Flora Lipps Inpatient   03/06/2016 1747 03/10/2016 1454 Full Code [id number]  [name], MD ED      Home/SNF/Other Home  Chief Complaint fall  Level of Care/Admitting Diagnosis ED Disposition    ED Disposition Condition [facility] Area: [place] [[id number]]  Level of Care: Stepdown [14]  Admit to SDU based on following criteria: Hemodynamic compromise or significant risk of instability:  Patient requiring short term acute titration and management of vasoactive drips, and invasive monitoring (i.e., CVP and Arterial line).  Diagnosis: Acute renal failure (ARF) ([place]) [419622]  Admitting Physician: [name] [[id number]]  Attending Physician: [name] [[id number]]  Estimated length of stay: 3 - 4 days  Certification:: I certify this patient will need inpatient services for at least 2 midnights  PT Class (Do Not Modify): Inpatient [101]  PT Acc Code (Do Not Modify): Private [1]       Medical History Past Medical History:  Diagnosis Date  . CAD (coronary artery disease)    small NSTEMI 11/12: LHC 01/06/11:  LAD 90-95%, mD1 90%, pD2 50%,  pOM1 50%, RCA occluded,  EF > 65%;  CABG  12/12: L-LAD  . Diabetes mellitus   . Glucose intolerance (impaired glucose tolerance)    Hemoglobin A1c 6.4 in 12/2010  . HTN (hypertension)    Echo 01/06/11: EF 65%  . Hyperlipidemia   . Obesity   . Stroke ([place]) 03/06/2016    Allergies Allergies  Allergen Reactions  . Lisinopril     cough  . Metformin And Related Diarrhea    A lot of fluid loss from bowel and diarrhea    IV Location/Drains/Wounds Patient  Lines/Drains/Airways Status   Active Line/Drains/Airways    Name:   Placement date:   Placement time:   Site:   Days:   Peripheral IV 05/12/17 Left Antecubital   05/12/17    2151    Antecubital   1   External Urinary Catheter   05/12/17    1932    -   1   AIRWAYS 8 mm   01/12/11    0757     2313   Incision Wrist Right   -    -        Incision 01/12/11 Chest Other (Comment)   01/12/11    1113     2313   Incision 01/12/11 Leg Right   01/12/11    1131     2313          Labs/Imaging Results for orders placed or performed during the hospital encounter of 05/12/17 (from the past 48 hour(s))  Comprehensive metabolic panel     Status: Abnormal   Collection Time: 05/12/17  8:23 PM  Result Value Ref Range   Sodium 150 (H) 135 - 145 mmol/L   Potassium 4.6 3.5 - 5.1 mmol/L   Chloride 110 101 - 111 mmol/L   CO2 22 22 - 32 mmol/L   Glucose, Bld 179 (H) 65 - 99 mg/dL   BUN 212 (H) 6 - 20 mg/dL    Comment: RESULTS CONFIRMED BY MANUAL DILUTION   Creatinine, Ser 4.39 (  H) 0.44 - 1.00 mg/dL   Calcium 9.6 8.9 - 10.3 mg/dL   Total Protein 7.8 6.5 - 8.1 g/dL   Albumin 3.1 (L) 3.5 - 5.0 g/dL   AST 32 15 - 41 U/L   ALT 36 14 - 54 U/L   Alkaline Phosphatase 107 38 - 126 U/L   Total Bilirubin 0.7 0.3 - 1.2 mg/dL   GFR calc non Af Amer 9 (L) >60 mL/min   GFR calc Af Amer 11 (L) >60 mL/min    Comment: (NOTE) The eGFR has been calculated using the CKD EPI equation. This calculation has not been validated in all clinical situations. eGFR's persistently <60 mL/min signify possible Chronic Kidney Disease.    Anion gap 18 (H) 5 - 15    Comment: Performed at [facility], [place] [address], [place], [place] [address]  Brain natriuretic peptide     Status: None   Collection Time: 05/12/17  8:23 PM  Result Value Ref Range   B Natriuretic Peptide 59.1 0.0 - 100.0 pg/mL    Comment: Performed at [facility], [place] [address], [place], [place] [address]  Troponin I      Status: Abnormal   Collection Time: 05/12/17  8:23 PM  Result Value Ref Range   Troponin I 0.05 (HH) <0.03 ng/mL    Comment: CRITICAL RESULT CALLED TO, READ BACK BY AND VERIFIED WITH: Lenise Herald AT 2109 ON 05/12/2017 BY MOSLEY,J Performed at [facility], [place] [address] [place], [place] [address]   CBC with Differential     Status: Abnormal   Collection Time: 05/12/17  8:23 PM  Result Value Ref Range   WBC 20.6 (H) 4.0 - 10.5 K/uL   RBC 4.41 3.87 - 5.11 MIL/uL   Hemoglobin 13.5 12.0 - 15.0 g/dL   HCT 40.3 36.0 - 46.0 %   MCV 91.4 78.0 - 100.0 fL   MCH 30.6 26.0 - 34.0 pg   MCHC 33.5 30.0 - 36.0 g/dL   RDW 15.6 (H) 11.5 - 15.5 %   Platelets 225 150 - 400 K/uL   Neutrophils Relative % 89 %   Lymphocytes Relative 4 %   Monocytes Relative 1 %   Eosinophils Relative 0 %   Basophils Relative 0 %   Band Neutrophils 2 %   Metamyelocytes Relative 2 %   Myelocytes 2 %   Neutro Abs 19.6 (H) 1.7 - 7.7 K/uL   Lymphs Abs 0.8 0.7 - 4.0 K/uL   Monocytes Absolute 0.2 0.1 - 1.0 K/uL   Eosinophils Absolute 0.0 0.0 - 0.7 K/uL   Basophils Absolute 0.0 0.0 - 0.1 K/uL   WBC Morphology TOXIC GRANULATION     Comment: DOHLE BODIES MILD LEFT SHIFT (1-5% METAS, OCC MYELO, OCC BANDS) Performed at [facility], [place] [address] [place], [place] [address]   CK     Status: Abnormal   Collection Time: 05/12/17  8:23 PM  Result Value Ref Range   Total CK 505 (H) 38 - 234 U/L    Comment: Performed at [facility], [place] [address], [place], [place] [address]  CBG monitoring, ED     Status: Abnormal   Collection Time: 05/12/17  8:35 PM  Result Value Ref Range   Glucose-Capillary 162 (H) 65 - 99 mg/dL  Lactic acid, plasma     Status: None   Collection Time: 05/12/17 10:32 PM  Result Value Ref Range   Lactic Acid, Venous 1.1 0.5 -  1.9 mmol/L    Comment: Performed at [facility], [place] [address], [place], [place] [address]  Sodium,  urine, random     Status: None   Collection Time: 05/12/17 10:33 PM  Result Value Ref Range   Sodium, Ur 19 mmol/L    Comment: Performed at [facility], [place] [address] [place], [place] [address]  Creatinine, urine, random     Status: None   Collection Time: 05/12/17 10:33 PM  Result Value Ref Range   Creatinine, Urine 83.12 mg/dL    Comment: Performed at [facility], [place] [address] [place], [place] [address]   Dg Chest 2 View  Result Date: 05/12/2017 CLINICAL DATA:  Weakness and fatigue for 4 days. History of stroke, hypertension, diabetes, coronary artery disease. Nonsmoker. EXAM: CHEST - 2 VIEW COMPARISON:  02/06/2011 FINDINGS: Postoperative changes in the mediastinum. Mild cardiac enlargement. No vascular congestion or edema. Shallow inspiration with elevation of the right hemidiaphragm and linear atelectasis in the right lung base. No blunting of costophrenic angles. No pneumothorax. Mediastinal contours appear intact. Calcification of the aorta. IMPRESSION: Cardiac enlargement. Elevated right hemidiaphragm with atelectasis in the right lung base. No focal consolidation. Aortic atherosclerosis. Electronically Signed   By: Lucienne Capers M.D.   On: 05/12/2017 21:03   Ct Head Wo Contrast  Result Date: 05/12/2017 CLINICAL DATA:  Diffuse weakness. Unable to stand on legs. Progressing for over 1 week. History of coronary disease, diabetes, and stroke. EXAM: CT HEAD WITHOUT CONTRAST TECHNIQUE: Contiguous axial images were obtained from the base of the skull through the vertex without intravenous contrast. COMPARISON:  MRI brain 03/07/2016. CT angio head 03/07/2016. CT head 03/06/2016 FINDINGS: Brain: Heterogeneous low-attenuation changes in the left cerebellum, similar to prior study, and consistent with known left posterior inferior cerebral artery infarct. There is possibly some progression in the area of involvement although this could be normal  evolutionary change. Consider MR for further evaluation if clinically indicated. No mass effect or midline shift. No abnormal extra-axial fluid collections. Gray-white matter junctions are distinct. Basal cisterns are not effaced. No ventricular dilatation. No acute intracranial hemorrhage. Vascular: Intracranial arterial vascular calcifications are present. Skull: Calvarium appears intact. No acute depressed skull fractures. Sinuses/Orbits: Mucosal thickening in the ethmoid air cells. No acute air-fluid levels. Mastoid air cells are clear. Other: None. IMPRESSION: Old infarct in the left cerebellum. Possible progression of the area of involvement although this could represent normal evolutionary change. MRI correlation may be useful if clinically indicated. No acute intracranial hemorrhage and no significant mass effect. Vascular calcifications. Electronically Signed   By: Lucienne Capers M.D.   On: 05/12/2017 23:59    Pending Labs Unresulted Labs (From admission, onward)   Start     Ordered   05/13/17 0100  Troponin I (q 6hr x 3)  Now then every 6 hours,   R     05/12/17 2302   05/13/17 1103  Basic metabolic panel  Once,   R     05/12/17 2302   05/13/17 0051  Culture, blood (x 2)  BLOOD CULTURE X 2,   STAT    Comments:  INITIATE ANTIBIOTICS WITHIN 1 HOUR AFTER BLOOD CULTURES DRAWN.  If unable to obtain blood cultures, call MD immediately regarding antibiotic instructions.    05/13/17 0050   05/13/17 0051  Procalcitonin  STAT,   R     05/13/17 0050   05/12/17 2234  Magnesium  Add-on,   R  05/12/17 2233   05/12/17 2234  Phosphorus  Add-on,   R     05/12/17 2233   05/12/17 2233  Osmolality, urine  Once,   R     05/12/17 2233   05/12/17 2233  Osmolality  Add-on,   R     05/12/17 2233   05/12/17 1946  Urinalysis, Routine w reflex microscopic  STAT,   STAT     05/12/17 1945   Signed and Held  Hemoglobin A1c  Tomorrow morning,   R    Comments:  To assess prior glycemic control    Signed  and Held   Signed and Held  Magnesium  Tomorrow morning,   R    Comments:  Call MD if <1.5    Signed and Held   Signed and Held  Phosphorus  Tomorrow morning,   R     Signed and Held   Signed and Held  TSH  Once,   R    Comments:  Cancel if already done within 1 month and notify MD    Signed and Held   Signed and Held  Comprehensive metabolic panel  Once,   R    Comments:  Cal MD for K<3.5 or >5.0    Signed and Held   Signed and Held  CBC  Once,   R    Comments:  Call for hg <8.0    Signed and Held      Vitals/Pain Today's Vitals   05/12/17 2014 05/12/17 2134 05/12/17 2300 05/13/17 0005  BP:   132/78 136/82  Pulse:  72 (!) 54 77  Resp:  '17 18 18  ' Temp: (!) 96 F (35.6 C)   (!) 96.3 F (35.7 C)  TempSrc: Rectal   Rectal  SpO2:  98% 90% 97%  Weight:      Height:      PainSc:   0-No pain     Isolation Precautions No active isolations  Medications Medications  piperacillin-tazobactam (ZOSYN) IVPB 3.375 g (has no administration in time range)  vancomycin (VANCOCIN) IVPB 1000 mg/200 mL premix (has no administration in time range)  sodium chloride 0.9 % bolus 1,000 mL (0 mLs Intravenous Stopped 05/12/17 2303)  aspirin chewable tablet 324 mg (324 mg Oral Given 05/12/17 2238)    Mobility non-ambulatory

## 2017-05-13 NOTE — Progress Notes (Signed)
Patient Demographics:    Megan Atkinson, is a 67 y.o. female, DOB - 20-Nov-1950, ZOX:[id number]  Admit date - 05/12/2017   Admitting Physician Therisa Doyne, MD  Outpatient Primary MD for the patient is Nche, Bonna Gains, NP  LOS - 1  Chief Complaint  Patient presents with  . Weakness       Subjective:    Megan Atkinson today has no fevers, no emesis,  No chest pain, more awake more talkative temperature is normalized  Assessment  & Plan :    Active Problems:   CAD (coronary artery disease)   Hypertension   Diabetes mellitus (HCC)   Hyperlipidemia   OSA (obstructive sleep apnea)   Morbid obesity (HCC)   Gait disturbance, post-stroke   Benign essential HTN   AKI (acute kidney injury) (HCC)   Elevated troponin   Dehydration   Hypernatremia   Elevated CK   Prolonged QT interval   Leukocytosis   Acute renal failure (ARF) (HCC)   SIRS (systemic inflammatory response syndrome) (HCC)   Acute lower UTI  Brief summary:- Megan Atkinson is a 67 y.o. female with medical history significant of HTN, morbid obesity, CAD, cerebellar CVA 2018, diastolic CHF, DM 2 admitted on 05/12/2017 with hypothermia, leukocytosis and altered mentation with concerns about possible sepsis and acute kidney injury, CT head was suggestive of possible progression of old left cerebellar stroke, brain MRI pending    Plan-  1)AKI----acute kidney injury      creatinine on admission= 4.39 ,   baseline creatinine = 0.8    , creatinine is now= 3.79     ,  Avoid nephrotoxic agents/dehydration/hypotension, prior to admission patient had normal renal function without underlying CKD,   2)Possible sepsis-patient was admitted with hypothermia and leukocytosis (WBC 20.6 on admission, down to 17.4 now), continue IV vancomycin and Zosyn pending blood and urine cultures  TSH is 2.9, pro calcitonin is elevated at 7.7, chest x-ray without  pneumonia  3)Possible Acute CVA-on admission patient had altered mentation and CT head on admission suggested possible progression of old left cerebellar stroke, MRI brain to be done when feasible, apparently patient is too large for the MRI machine at [facility], patient will have to be transferred to [facility] for MRI brain to be completed.  Continue aspirin, Plavix and Lipitor  3)Dehydration/Hypernatremia-sodium is 150, patient hydrated with IV normal saline, okay to switch to half-normal saline  4)Generalized weakness/debility -get physical therapy evaluation when patient is more stable  5)Morbid obesity/OSA-patient has been noncompliant with CPAP, compliance advised  6)HTN/H/o CAD-stable at this time, continue amlodipine 2.5 mg daily, metoprolol 12.5 mg twice daily, continue aspirin, Plavix and Lipitor for CAD.  No chest pains or ACS type symptoms  7)DM2-A1c is 8.0, Allow some permissive Hyperglycemia rather than risk life-threatening hypoglycemia in a patient with unreliable oral intake. Use Novolog/Humalog Sliding scale insulin with Accu-Cheks/Fingersticks as ordered , may get more aggressive with glucose control once mental status is much improved and patient is eating better  8)Social/Ethics--- Pt is a Full Code,   Disposition Plan  : Home with HH Vs SNF  Consults  :  PT/Pharmacy  DVT Prophylaxis  :  Heparin   Lab Results  Component Value Date  PLT 208 05/13/2017    Inpatient Medications  Scheduled Meds: . amLODipine  2.5 mg Oral Daily  . atorvastatin  80 mg Oral q1800  . clopidogrel  75 mg Oral Daily  . insulin aspart  0-9 Units Subcutaneous Q4H  . metoprolol tartrate  12.5 mg Oral BID   Continuous Infusions: . sodium chloride 100 mL/hr at 05/13/17 0620  . piperacillin-tazobactam (ZOSYN)  IV Stopped (05/13/17 1025)  . [START ON 05/15/2017] vancomycin     PRN Meds:.acetaminophen **OR** acetaminophen, HYDROcodone-acetaminophen    Anti-infectives  (From admission, onward)   Start     Dose/Rate Route Frequency Ordered Stop   05/15/17 0600  vancomycin (VANCOCIN) IVPB 1000 mg/200 mL premix     1,000 mg 200 mL/hr over 60 Minutes Intravenous Every 48 hours 05/13/17 0143     05/13/17 0800  piperacillin-tazobactam (ZOSYN) IVPB 2.25 g     2.25 g 100 mL/hr over 30 Minutes Intravenous Every 6 hours 05/13/17 0142     05/13/17 0130  vancomycin (VANCOCIN) 2,500 mg in sodium chloride 0.9 % 500 mL IVPB     2,500 mg 250 mL/hr over 120 Minutes Intravenous  Once 05/13/17 0123 05/13/17 0410   05/13/17 0100  piperacillin-tazobactam (ZOSYN) IVPB 3.375 g     3.375 g 100 mL/hr over 30 Minutes Intravenous  Once 05/13/17 0050 05/13/17 0131   05/13/17 0100  vancomycin (VANCOCIN) IVPB 1000 mg/200 mL premix  Status:  Discontinued     1,000 mg 200 mL/hr over 60 Minutes Intravenous  Once 05/13/17 0050 05/13/17 0123        Objective:   Vitals:   05/13/17 0530 05/13/17 0600 05/13/17 0800 05/13/17 1200  BP: (!) 128/38 (!) 145/36    Pulse:      Resp: 12 11    Temp:   97.7 F (36.5 C) 98 F (36.7 C)  TempSrc:   Oral Oral  SpO2: 92% 92%    Weight:      Height:        Wt Readings from Last 3 Encounters:  05/13/17 (!) 168.7 kg (372 lb)  12/06/16 (!) 162.4 kg (358 lb)  04/26/16 (!) 156.5 kg (345 lb)     Intake/Output Summary (Last 24 hours) at 05/13/2017 1447 Last data filed at 05/13/2017 1226 Gross per 24 hour  Intake 2004 ml  Output 1500 ml  Net 504 ml     Physical Exam  Gen:- Awake Alert, morbidly obese, in no acute distress HEENT:- [place].AT, No sclera icterus Neck-Supple Neck,No JVD,.  Lungs-  CTAB , fair symmetrical air movement CV- S1, S2 normal, regular Abd-  +ve B.Sounds, Abd Soft, No tenderness,    Extremity/Skin:- now warm, neg homan's  Psych-affect is appropriate, more awake more coherent Neuro- no tremors, generalized weakness without new focal deficits at this time patient is more awake and more interactive   Data Review:    Micro Results Recent Results (from the past 240 hour(s))  MRSA PCR Screening     Status: None   Collection Time: 05/13/17  2:04 AM  Result Value Ref Range Status   MRSA by PCR NEGATIVE NEGATIVE Final    Comment:        The GeneXpert MRSA Assay (FDA approved for NASAL specimens only), is one component of a comprehensive MRSA colonization surveillance program. It is not intended to diagnose MRSA infection nor to guide or monitor treatment for MRSA infections. Performed at [facility], [address] [place], [place] [id number]   Culture, blood (  x 2)     Status: None (Preliminary result)   Collection Time: 05/13/17  2:14 AM  Result Value Ref Range Status   Specimen Description   Final    BLOOD RIGHT ANTECUBITAL Performed at [facility], [address], [place], [place] [address]    Special Requests   Final    BOTTLES DRAWN AEROBIC AND ANAEROBIC Blood Culture adequate volume Performed at [facility], [address], [place], [place] [address]    Culture PENDING  Incomplete   Report Status PENDING  Incomplete    Radiology Reports Dg Chest 2 View  Result Date: 05/12/2017 CLINICAL DATA:  Weakness and fatigue for 4 days. History of stroke, hypertension, diabetes, coronary artery disease. Nonsmoker. EXAM: CHEST - 2 VIEW COMPARISON:  02/06/2011 FINDINGS: Postoperative changes in the mediastinum. Mild cardiac enlargement. No vascular congestion or edema. Shallow inspiration with elevation of the right hemidiaphragm and linear atelectasis in the right lung base. No blunting of costophrenic angles. No pneumothorax. Mediastinal contours appear intact. Calcification of the aorta. IMPRESSION: Cardiac enlargement. Elevated right hemidiaphragm with atelectasis in the right lung base. No focal consolidation. Aortic atherosclerosis. Electronically Signed   By: Burman Nieves M.D.   On: 05/12/2017 21:03   Ct Head Wo Contrast  Result Date:  05/12/2017 CLINICAL DATA:  Diffuse weakness. Unable to stand on legs. Progressing for over 1 week. History of coronary disease, diabetes, and stroke. EXAM: CT HEAD WITHOUT CONTRAST TECHNIQUE: Contiguous axial images were obtained from the base of the skull through the vertex without intravenous contrast. COMPARISON:  MRI brain 03/07/2016. CT angio head 03/07/2016. CT head 03/06/2016 FINDINGS: Brain: Heterogeneous low-attenuation changes in the left cerebellum, similar to prior study, and consistent with known left posterior inferior cerebral artery infarct. There is possibly some progression in the area of involvement although this could be normal evolutionary change. Consider MR for further evaluation if clinically indicated. No mass effect or midline shift. No abnormal extra-axial fluid collections. Gray-white matter junctions are distinct. Basal cisterns are not effaced. No ventricular dilatation. No acute intracranial hemorrhage. Vascular: Intracranial arterial vascular calcifications are present. Skull: Calvarium appears intact. No acute depressed skull fractures. Sinuses/Orbits: Mucosal thickening in the ethmoid air cells. No acute air-fluid levels. Mastoid air cells are clear. Other: None. IMPRESSION: Old infarct in the left cerebellum. Possible progression of the area of involvement although this could represent normal evolutionary change. MRI correlation may be useful if clinically indicated. No acute intracranial hemorrhage and no significant mass effect. Vascular calcifications. Electronically Signed   By: Burman Nieves M.D.   On: 05/12/2017 23:59     CBC Recent Labs  Lab 05/12/17 2023 05/13/17 0759  WBC 20.6* 17.4*  HGB 13.5 11.3*  HCT 40.3 34.9*  PLT 225 208  MCV 91.4 91.8  MCH 30.6 29.7  MCHC 33.5 32.4  RDW 15.6* 15.7*  LYMPHSABS 0.8  --   MONOABS 0.2  --   EOSABS 0.0  --   BASOSABS 0.0  --     Chemistries  Recent Labs  Lab 05/12/17 2023 05/13/17 0214 05/13/17 0759  NA  150* 145 150*  K 4.6 4.3 4.3  CL 110 110 114*  CO2 22 19* 23  GLUCOSE 179* 214* 155*  BUN 212* 217* 206*  CREATININE 4.39* 3.94* 3.79*  CALCIUM 9.6 8.8* 8.9  MG  --  2.9* 2.8*  AST 32  --  25  ALT 36  --  28  ALKPHOS 107  --  85  BILITOT 0.7  --  0.9   ------------------------------------------------------------------------------------------------------------------ No results for input(s): CHOL, HDL, LDLCALC, TRIG, CHOLHDL, LDLDIRECT in the last 72 hours.  Lab Results  Component Value Date   HGBA1C 8.0 (H) 05/13/2017   ------------------------------------------------------------------------------------------------------------------ Recent Labs    05/13/17 0759  TSH 2.966   ------------------------------------------------------------------------------------------------------------------ No results for input(s): VITAMINB12, FOLATE, FERRITIN, TIBC, IRON, RETICCTPCT in the last 72 hours.  Coagulation profile No results for input(s): INR, PROTIME in the last 168 hours.  No results for input(s): DDIMER in the last 72 hours.  Cardiac Enzymes Recent Labs  Lab 05/12/17 2023 05/13/17 0214 05/13/17 0759  TROPONINI 0.05* 0.03* 0.04*   ------------------------------------------------------------------------------------------------------------------    Component Value Date/Time   BNP 59.1 05/12/2017 2023     Shon Hale M.D on 05/13/2017 at 2:47 PM  Between 7am to 7pm - Pager - [phone]  After 7pm go to [email or web address] - password [email or web address]  [facility] -  Office  [phone]   Voice Recognition Reubin Milan dictation system was used to create this note, attempts have been made to correct errors. Please contact the author with questions and/or clarifications.

## 2017-05-13 NOTE — Evaluation (Signed)
Physical Therapy Evaluation Patient Details Name: Megan Atkinson MRN: [id number] DOB: 1950/08/08 Today's Date: 05/13/2017   History of Present Illness  67 yo female admitted with AKI. Hx of morbid obesity, CAD, cerebellar CVA, CHF, DM, vertigo.   Clinical Impression  On eval, pt required Total assist +2 for bed mobility and Mod assist +2 for static standing with a RW. Pt presents with general weakness, decreased activity tolerance, impaired gait and balance, and significant risk for falls. At this time, recommendations is for SNF for continued rehab prior to returning home. Will follow and progress activity as tolerated.     Follow Up Recommendations SNF    Equipment Recommendations  None recommended by PT    Recommendations for Other Services       Precautions / Restrictions Precautions Precautions: Fall Precaution Comments: incontinent Restrictions Weight Bearing Restrictions: No      Mobility  Bed Mobility Overal bed mobility: Needs Assistance Bed Mobility: Rolling;Supine to Sit;Sit to Supine Rolling: Min assist;+2 for safety/equipment;+2 for physical assistance   Supine to sit: Max assist;+2 for physical assistance;+2 for safety/equipment;HOB elevated Sit to supine: Total assist;+2 for physical assistance   General bed mobility comments: +3 assist for supine<>sit bed mobility. Assist needed for trunk and bil LEs. Multimodal cues for safety, technique. Increased time required for all bed mobility tasks.   Transfers Overall transfer level: Needs assistance Equipment used: Rolling walker (2 wheeled) Transfers: Sit to/from Stand Sit to Stand: Mod assist;+2 physical assistance;+2 safety/equipment         General transfer comment: Assist to block knees, rise, stabilize, control descent. 2 attempts to get to full standing with bari walker. Pt was able to stand for ~30 seconds. Unable to take any pivotal or ambulatory steps. Cues for safety, technique.   Ambulation/Gait             General Gait Details: NT  Stairs            Wheelchair Mobility    Modified Rankin (Stroke Patients Only)       Balance Overall balance assessment: Needs assistance   Sitting balance-Leahy Scale: Fair     Standing balance support: Bilateral upper extremity supported Standing balance-Leahy Scale: Poor                               Pertinent Vitals/Pain Pain Assessment: No/denies pain    Home Living Family/patient expects to be discharged to:: Unsure Living Arrangements: Spouse/significant other Available Help at Discharge: Family;Available 24 hours/day Type of Home: House Home Access: Level entry     Home Layout: Multi-level Home Equipment: Walker - 2 wheels;Wheelchair - manual      Prior Function Level of Independence: Independent with assistive device(s)         Comments: pt stated she was ambulatory with a RW. Sometimes uses wheelchair. Mod Ind with ADLs.      Hand Dominance        Extremity/Trunk Assessment   Upper Extremity Assessment Upper Extremity Assessment: Generalized weakness    Lower Extremity Assessment Lower Extremity Assessment: Generalized weakness       Communication   Communication: No difficulties  Cognition Arousal/Alertness: Awake/alert Behavior During Therapy: WFL for tasks assessed/performed Overall Cognitive Status: Within Functional Limits for tasks assessed  General Comments      Exercises     Assessment/Plan    PT Assessment Patient needs continued PT services  PT Problem List Decreased strength;Obesity;Decreased mobility;Decreased balance;Decreased activity tolerance;Decreased knowledge of use of DME;Decreased range of motion;Decreased skin integrity       PT Treatment Interventions DME instruction;Gait training;Functional mobility training;Therapeutic activities;Balance training;Patient/family education;Therapeutic  exercise    PT Goals (Current goals can be found in the Care Plan section)  Acute Rehab PT Goals Patient Stated Goal: to be able to walk again PT Goal Formulation: With patient Time For Goal Achievement: 05/27/17 Potential to Achieve Goals: Good    Frequency Min 3X/week   Barriers to discharge        Co-evaluation               AM-PAC PT "6 Clicks" Daily Activity  Outcome Measure Difficulty turning over in bed (including adjusting bedclothes, sheets and blankets)?: Unable Difficulty moving from lying on back to sitting on the side of the bed? : Unable Difficulty sitting down on and standing up from a chair with arms (e.g., wheelchair, bedside commode, etc,.)?: Unable Help needed moving to and from a bed to chair (including a wheelchair)?: Total Help needed walking in hospital room?: Total Help needed climbing 3-5 steps with a railing? : Total 6 Click Score: 6    End of Session   Activity Tolerance: Patient limited by fatigue Patient left: in bed;with call bell/phone within reach   PT Visit Diagnosis: Muscle weakness (generalized) (M62.81);Difficulty in walking, not elsewhere classified (R26.2);Other abnormalities of gait and mobility (R26.89)    Time: [id number] PT Time Calculation (min) (ACUTE ONLY): 38 min   Charges:   PT Evaluation $PT Eval Moderate Complexity: 1 Mod PT Treatments $Therapeutic Activity: 23-37 mins   PT G Codes:          Rebeca AlertJannie Gordon Carlson, MPT Pager: [phone]

## 2017-05-13 NOTE — Progress Notes (Signed)
Assumed care of patient at . Agree with previous Nurse assessment. Patient A&O x 4 , no acute distress, denies pain. Telemetry applied and stable.  MSAD noticed in perineum, patient refuses to turn in bed to allow RN to assess buttocks.  Earnest ConroyBrooke M. Clelia CroftShaw, RN

## 2017-05-13 NOTE — Progress Notes (Signed)
  Echocardiogram 2D Echocardiogram has been performed.  Yuki Brunsman G Blimie Vaness 05/13/2017, 9:21 AM

## 2017-05-13 NOTE — Progress Notes (Signed)
Pharmacy Antibiotic Note  Megan NielsenBeverley Atkinson is a 67 y.o. female admitted on 05/12/2017 with sepsis.  Pharmacy has been consulted for zosyn and vancomycin dosing.  Plan: Zosyn 3.375 Gm x1 then 2.25 Gm IV q6h Vancomycin 2500 mg x1 then 1 Gm IV q48h for est AUC=529 Goal AUC = 400-500 F/u scr/cultures/levels  Height: 5\' 7"  (170.2 cm) Weight: (!) 358 lb (162.4 kg) IBW/kg (Calculated) : 61.6  Temp (24hrs), Avg:96.2 F (35.7 C), Min:96 F (35.6 C), Max:96.3 F (35.7 C)  Recent Labs  Lab 05/12/17 2023 05/12/17 2232  WBC 20.6*  --   CREATININE 4.39*  --   LATICACIDVEN  --  1.1    Estimated Creatinine Clearance: 19.7 mL/min (A) (by C-G formula based on SCr of 4.39 mg/dL (H)).    Allergies  Allergen Reactions  . Lisinopril     cough  . Metformin And Related Diarrhea    A lot of fluid loss from bowel and diarrhea    Antimicrobials this admission: 4/7 zosyn >>  4/7 vancomycin >>   Dose adjustments this admission:   Microbiology results:  BCx:   UCx:    Sputum:    MRSA PCR:   Thank you for allowing pharmacy to be a part of this patient's care.  Lorenza EvangelistGreen, [place] Zaino R 05/13/2017 1:34 AM

## 2017-05-13 NOTE — Progress Notes (Signed)
Pt transferred to 1403. Report called to [facility], Charity fundraiserN. No question or concerns for RN at this time. Pt transferred safely to room.

## 2017-05-14 ENCOUNTER — Ambulatory Visit (HOSPITAL_COMMUNITY): Admit: 2017-05-14 | Payer: Medicare Other

## 2017-05-14 ENCOUNTER — Encounter (HOSPITAL_COMMUNITY): Payer: Self-pay | Admitting: *Deleted

## 2017-05-14 ENCOUNTER — Ambulatory Visit (HOSPITAL_COMMUNITY)
Admit: 2017-05-14 | Discharge: 2017-05-14 | Disposition: A | Discharge status: Home / Self Care | Payer: Medicare Other | Attending: Family Medicine | Admitting: Family Medicine

## 2017-05-14 LAB — BASIC METABOLIC PANEL
ANION GAP: 13 (ref 5–15)
BUN: 118 mg/dL — AB (ref 6–20)
CALCIUM: 7.8 mg/dL — AB (ref 8.9–10.3)
CO2: 20 mmol/L — ABNORMAL LOW (ref 22–32)
CREATININE: 3.04 mg/dL — AB (ref 0.44–1.00)
Chloride: 118 mmol/L — ABNORMAL HIGH (ref 101–111)
GFR calc Af Amer: 17 mL/min — ABNORMAL LOW (ref >60)
GFR, EST NON AFRICAN AMERICAN: 15 mL/min — AB (ref >60)
GLUCOSE: 148 mg/dL — AB (ref 65–99)
Potassium: 4 mmol/L (ref 3.5–5.1)
Sodium: 151 mmol/L — ABNORMAL HIGH (ref 135–145)

## 2017-05-14 LAB — CBC
HCT: 34.7 % — ABNORMAL LOW (ref 36.0–46.0)
Hemoglobin: 10.9 g/dL — ABNORMAL LOW (ref 12.0–15.0)
MCH: 29.5 pg (ref 26.0–34.0)
MCHC: 31.4 g/dL (ref 30.0–36.0)
MCV: 94 fL (ref 78.0–100.0)
PLATELETS: 228 10*3/uL (ref 150–400)
RBC: 3.69 MIL/uL — ABNORMAL LOW (ref 3.87–5.11)
RDW: 15.9 % — AB (ref 11.5–15.5)
WBC: 13.9 10*3/uL — AB (ref 4.0–10.5)

## 2017-05-14 LAB — GLUCOSE, CAPILLARY
GLUCOSE-CAPILLARY: 122 mg/dL — AB (ref 65–99)
GLUCOSE-CAPILLARY: 133 mg/dL — AB (ref 65–99)
GLUCOSE-CAPILLARY: 135 mg/dL — AB (ref 65–99)
Glucose-Capillary: 115 mg/dL — ABNORMAL HIGH (ref 65–99)
Glucose-Capillary: 128 mg/dL — ABNORMAL HIGH (ref 65–99)
Glucose-Capillary: 130 mg/dL — ABNORMAL HIGH (ref 65–99)

## 2017-05-14 MED ORDER — SODIUM CHLORIDE 0.9 % IV SOLN
2.0000 g | INTRAVENOUS | Status: DC
  Administered 2017-05-14: 2 g via INTRAVENOUS
  Filled 2017-05-14: qty 2

## 2017-05-14 MED ORDER — PIPERACILLIN-TAZOBACTAM 3.375 G IVPB
3.3750 g | Freq: Three times a day (TID) | INTRAVENOUS | Status: DC
  Administered 2017-05-14: 3.375 g via INTRAVENOUS
  Filled 2017-05-14: qty 50

## 2017-05-14 NOTE — Progress Notes (Signed)
PROGRESS NOTE    Megan Atkinson  ZOX:[id number] DOB: 08/01/1950 DOA: 05/12/2017 PCP: Anne Ng, NP  Outpatient Specialists:   Brief Narrative:  67 year old female with history of obesity, CAD, Hypertension, Diabetes Mellitus type 2, CVA 2018, MI 2012 status post CABG on Plavix, diastolic CHF, hyperlipidemia. Patient was found at home in wheelchair by friend, unable to get up or move due to weakness. Presented to ED with progressive weakness over past week and was grossly dehydrated. Admitted with hypothermia, leukocytosis, and altered mental status on working diagnosis of dehydration resulting in acute renal failure.    Assessment & Plan:   1. UTI/possible sepsis - admitted with hypothermia and leukocytosis, procalcitonin 7.7 on admission, lactic acid 1.1 on admission - WBC at 13.9, decreased from 20.6 at admission - Urinalysis showed red urine with many bacteria and large leukocytes. Urine cultures showed gram negative rods  - blood cultures pending - Discontinued IV vancomycin and Zosyn, started IV Rocephin  2. Acute Kidney Injury - baseline Cr 0.8, Cr today 3.04, decreased from 4.39 at admission - Continue IV fluids, avoid nephrotoxic agents  3. Possible CVA -altered mental status at admission, CT head suggested possible progression of old left cerebellar stroke - MRI brain ordered, patient to be transported to [facility] for [facility] today, patient is too large for MRI machine at [facility] - Continue Plavix, Asprin, Lipitor  4. Hypernatremia - Na is 150, continue IV fluids  5. Generalized weakness/decreased activity tolerance - PT evaluation performed today  6. Morbid obesity/Obstructive Sleep Apnea - patient noncompliant with CPAP at home  7. HTN/CAD - BP is stable - Continue amlodipine, metoprolol  8. DM Type II - last A1C is 8.0 - continue Novolog/humalog  DVT prophylaxis: Heparin Code Status: Full Family Communication: Family not at  bedside Disposition Plan: Discharged when clinically stable   Consultants:   None  Procedures:   Echocardiogram 4/7  Antimicrobials:   Vancomycin 4/7-4/8  Zosyn 4/7-4/8  Rocephin 4/8-   Subjective: Patient is alert and pleasant, oriented to person, place, and time. Patient complains of general weakness but reports no pain.   Objective: Vitals:   05/13/17 1639 05/13/17 1710 05/13/17 2104 05/14/17 0453  BP: (!) 136/42 (!) 125/44 (!) 150/50 (!) 120/56  Pulse:  75 76 74  Resp:  18 16 20   Temp:  98.3 F (36.8 C) 98.1 F (36.7 C) 98 F (36.7 C)  TempSrc:  Oral Oral Oral  SpO2:  96% 92% 95%  Weight:      Height:        Intake/Output Summary (Last 24 hours) at 05/14/2017 1156 Last data filed at 05/14/2017 0600 Gross per 24 hour  Intake 4480.82 ml  Output 2000 ml  Net 2480.82 ml   Filed Weights   05/12/17 1929 05/13/17 0202 05/13/17 0343  Weight: (!) 162.4 kg (358 lb) (!) 168.7 kg (372 lb) (!) 168.7 kg (372 lb)    Examination:  General exam: Appears calm and comfortable  Respiratory system: Clear to auscultation. Respiratory effort normal. Cardiovascular system: S1 & S2 heard, RRR. No JVD. No peripheral edema. Gastrointestinal system: Abdomen is nondistended, soft and nontender. Central nervous system: Alert and oriented.  Skin: No rashes, lesions or ulcers Psychiatry: Judgement and insight appear normal. Mood & affect appropriate.     Data Reviewed: I have personally reviewed following labs and imaging studies  CBC: Recent Labs  Lab 05/12/17 2023 05/13/17 0759 05/14/17 0459  WBC 20.6* 17.4* 13.9*  NEUTROABS  19.6*  --   --   HGB 13.5 11.3* 10.9*  HCT 40.3 34.9* 34.7*  MCV 91.4 91.8 94.0  PLT 225 208 228   Basic Metabolic Panel: Recent Labs  Lab 05/12/17 2023 05/13/17 0214 05/13/17 0759 05/14/17 0459  NA 150* 145 150* 151*  K 4.6 4.3 4.3 4.0  CL 110 110 114* 118*  CO2 22 19* 23 20*  GLUCOSE 179* 214* 155* 148*  BUN 212* 217* 206* 118*    CREATININE 4.39* 3.94* 3.79* 3.04*  CALCIUM 9.6 8.8* 8.9 7.8*  MG  --  2.9* 2.8*  --   PHOS  --  8.7* 8.5*  --    GFR: Estimated Creatinine Clearance: 29.2 mL/min (A) (by C-G formula based on SCr of 3.04 mg/dL (H)). Liver Function Tests: Recent Labs  Lab 05/12/17 2023 05/13/17 0759  AST 32 25  ALT 36 28  ALKPHOS 107 85  BILITOT 0.7 0.9  PROT 7.8 6.3*  ALBUMIN 3.1* 2.5*   No results for input(s): LIPASE, AMYLASE in the last 168 hours. No results for input(s): AMMONIA in the last 168 hours. Coagulation Profile: No results for input(s): INR, PROTIME in the last 168 hours. Cardiac Enzymes: Recent Labs  Lab 05/12/17 2023 05/13/17 0214 05/13/17 0759 05/13/17 1435  CKTOTAL 505*  --  331*  --   TROPONINI 0.05* 0.03* 0.04* 0.03*   BNP (last 3 results) No results for input(s): PROBNP in the last 8760 hours. HbA1C: Recent Labs    05/13/17 0759  HGBA1C 8.0*   CBG: Recent Labs  Lab 05/13/17 1621 05/13/17 2101 05/14/17 0001 05/14/17 0443 05/14/17 0735  GLUCAP 112* 120* 133* 135* 122*   Lipid Profile: No results for input(s): CHOL, HDL, LDLCALC, TRIG, CHOLHDL, LDLDIRECT in the last 72 hours. Thyroid Function Tests: Recent Labs    05/13/17 0759  TSH 2.966   Anemia Panel: No results for input(s): VITAMINB12, FOLATE, FERRITIN, TIBC, IRON, RETICCTPCT in the last 72 hours. Urine analysis:    Component Value Date/Time   COLORURINE RED (A) 05/12/2017 2214   APPEARANCEUR CLOUDY (A) 05/12/2017 2214   LABSPEC 1.014 05/12/2017 2214   PHURINE 8.0 05/12/2017 2214   GLUCOSEU NEGATIVE 05/12/2017 2214   GLUCOSEU NEGATIVE 04/05/2011 1129   HGBUR LARGE (A) 05/12/2017 2214   BILIRUBINUR NEGATIVE 05/12/2017 2214   KETONESUR NEGATIVE 05/12/2017 2214   PROTEINUR 100 (A) 05/12/2017 2214   UROBILINOGEN 0.2 04/05/2011 1129   NITRITE NEGATIVE 05/12/2017 2214   LEUKOCYTESUR LARGE (A) 05/12/2017 2214   Sepsis Labs: @LABRCNTIP (procalcitonin:4,lacticidven:4)  ) Recent Results  (from the past 240 hour(s))  Urine Culture     Status: Abnormal (Preliminary result)   Collection Time: 05/13/17  1:14 AM  Result Value Ref Range Status   Specimen Description   Final    URINE, CLEAN CATCH Performed at [facility], [address], [place], [place] [address]    Special Requests   Final    NONE Performed at [facility], [address] [place], [place] [address]    Culture (A)  Final    >=100,000 COLONIES/mL GRAM NEGATIVE RODS IDENTIFICATION AND SUSCEPTIBILITIES TO FOLLOW Performed at [facility], [address] [place], [place] [address]    Report Status PENDING  Incomplete  MRSA PCR Screening     Status: None   Collection Time: 05/13/17  2:04 AM  Result Value Ref Range Status   MRSA by PCR NEGATIVE NEGATIVE Final    Comment:  The GeneXpert MRSA Assay (FDA approved for NASAL specimens only), is one component of a comprehensive MRSA colonization surveillance program. It is not intended to diagnose MRSA infection nor to guide or monitor treatment for MRSA infections. Performed at [facility], [address], [place], [place] [address]   Culture, blood (x 2)     Status: None (Preliminary result)   Collection Time: 05/13/17  2:14 AM  Result Value Ref Range Status   Specimen Description   Final    BLOOD RIGHT ANTECUBITAL Performed at [facility], [address], [place], [place] [address]    Special Requests   Final    BOTTLES DRAWN AEROBIC AND ANAEROBIC Blood Culture adequate volume Performed at [facility], [address], [place], [place] [address]    Culture PENDING  Incomplete   Report Status PENDING  Incomplete         Radiology Studies: Dg Chest 2 View  Result Date: 05/12/2017 CLINICAL DATA:  Weakness and fatigue for 4 days. History of stroke, hypertension, diabetes, coronary artery disease. Nonsmoker. EXAM: CHEST - 2 VIEW COMPARISON:   02/06/2011 FINDINGS: Postoperative changes in the mediastinum. Mild cardiac enlargement. No vascular congestion or edema. Shallow inspiration with elevation of the right hemidiaphragm and linear atelectasis in the right lung base. No blunting of costophrenic angles. No pneumothorax. Mediastinal contours appear intact. Calcification of the aorta. IMPRESSION: Cardiac enlargement. Elevated right hemidiaphragm with atelectasis in the right lung base. No focal consolidation. Aortic atherosclerosis. Electronically Signed   By: Burman Nieves M.D.   On: 05/12/2017 21:03   Ct Head Wo Contrast  Result Date: 05/12/2017 CLINICAL DATA:  Diffuse weakness. Unable to stand on legs. Progressing for over 1 week. History of coronary disease, diabetes, and stroke. EXAM: CT HEAD WITHOUT CONTRAST TECHNIQUE: Contiguous axial images were obtained from the base of the skull through the vertex without intravenous contrast. COMPARISON:  MRI brain 03/07/2016. CT angio head 03/07/2016. CT head 03/06/2016 FINDINGS: Brain: Heterogeneous low-attenuation changes in the left cerebellum, similar to prior study, and consistent with known left posterior inferior cerebral artery infarct. There is possibly some progression in the area of involvement although this could be normal evolutionary change. Consider MR for further evaluation if clinically indicated. No mass effect or midline shift. No abnormal extra-axial fluid collections. Gray-white matter junctions are distinct. Basal cisterns are not effaced. No ventricular dilatation. No acute intracranial hemorrhage. Vascular: Intracranial arterial vascular calcifications are present. Skull: Calvarium appears intact. No acute depressed skull fractures. Sinuses/Orbits: Mucosal thickening in the ethmoid air cells. No acute air-fluid levels. Mastoid air cells are clear. Other: None. IMPRESSION: Old infarct in the left cerebellum. Possible progression of the area of involvement although this could  represent normal evolutionary change. MRI correlation may be useful if clinically indicated. No acute intracranial hemorrhage and no significant mass effect. Vascular calcifications. Electronically Signed   By: Burman Nieves M.D.   On: 05/12/2017 23:59        Scheduled Meds: . amLODipine  2.5 mg Oral Daily  . aspirin EC  81 mg Oral Daily  . atorvastatin  80 mg Oral q1800  . chlorhexidine  15 mL Mouth Rinse BID  . clopidogrel  75 mg Oral Daily  . heparin injection (subcutaneous)  5,000 Units Subcutaneous Q8H  . insulin aspart  0-9 Units Subcutaneous Q4H  . mouth rinse  15 mL Mouth Rinse q12n4p  . metoprolol tartrate  12.5 mg Oral BID   Continuous Infusions: . sodium  chloride 125 mL/hr at 05/13/17 1719  . cefTRIAXone (ROCEPHIN)  IV       LOS: 2 days    Time spent: 25 min    Janine OresBlaine Saje Gallop, PA-S  [facility] If 7PM-7AM, please contact night-coverage [email or web address] Password [email or web address] 05/14/2017, 11:56 AM

## 2017-05-14 NOTE — Evaluation (Signed)
Occupational Therapy Evaluation Patient Details Name: Megan NielsenBeverley Atkinson MRN: [id number] DOB: [birth year] Today's Date: 05/14/2017    History of Present Illness 67 yo female admitted with AKI. Hx of morbid obesity, CAD, cerebellar CVA, CHF, DM, vertigo.    Clinical Impression   Pt admitted with AKI. Pt currently with functional limitations due to the deficits listed below (see OT Problem List).  Pt will benefit from skilled OT to increase their safety and independence with ADL and functional mobility for ADL to facilitate discharge to venue listed below.     Follow Up Recommendations  SNF    Equipment Recommendations  None recommended by OT    Recommendations for Other Services       Precautions / Restrictions Precautions Precautions: Fall Precaution Comments: incontinent Restrictions Weight Bearing Restrictions: No      Mobility Bed Mobility Overal bed mobility: Needs Assistance Bed Mobility: Rolling Rolling: Total assist for bed pan placement            Transfers          NT            Balance Overall balance assessment: Needs assistance   Sitting balance-Leahy Scale: Fair     Standing balance support: Bilateral upper extremity supported Standing balance-Leahy Scale: Poor                             ADL either performed or assessed with clinical judgement   ADL Overall ADL's : Needs assistance/impaired Eating/Feeding: Minimal assistance;Bed level   Grooming: Minimal assistance;Bed level                   Toilet Transfer: Total assistance;Maximal assistance Toilet Transfer Details (indicate cue type and reason): max A- total A in bed using bed rail Toileting- Clothing Manipulation and Hygiene: Total assistance;Bed level         General ADL Comments: pt needed to have a BM.  Bedpan placed and CNA notified     Vision Patient Visual Report: No change from baseline              Pertinent Vitals/Pain Pain Assessment:  No/denies pain     Hand Dominance     Extremity/Trunk Assessment Upper Extremity Assessment Upper Extremity Assessment: Generalized weakness           Communication Communication Communication: No difficulties   Cognition Arousal/Alertness: Awake/alert Behavior During Therapy: WFL for tasks assessed/performed Overall Cognitive Status: Within Functional Limits for tasks assessed                                                Home Living Family/patient expects to be discharged to:: Skilled nursing facility Living Arrangements: Spouse/significant other Available Help at Discharge: Family;Available 24 hours/day Type of Home: House Home Access: Level entry     Home Layout: Multi-level Alternate Level Stairs-Number of Steps: 3+8 Alternate Level Stairs-Rails: Right           Home Equipment: Walker - 2 wheels;Wheelchair - manual          Prior Functioning/Environment Level of Independence: Independent with assistive device(s)        Comments: pt stated she was ambulatory with a RW. Sometimes uses wheelchair. Mod Ind with ADLs.         OT Problem List: Decreased strength;Decreased activity  tolerance;Decreased knowledge of use of DME or AE;Impaired balance (sitting and/or standing);Decreased safety awareness;Decreased knowledge of precautions      OT Treatment/Interventions: Self-care/ADL training;DME and/or AE instruction;Therapeutic activities;Balance training;Therapeutic exercise;Patient/family education    OT Goals(Current goals can be found in the care plan section) Acute Rehab OT Goals Patient Stated Goal: to be able to walk again OT Goal Formulation: With patient Time For Goal Achievement: 05/28/17 Potential to Achieve Goals: Fair  OT Frequency: Min 2X/week   Barriers to D/C: Decreased caregiver support             AM-PAC PT "6 Clicks" Daily Activity     Outcome Measure Help from another person eating meals?: A Little Help from  another person taking care of personal grooming?: A Little Help from another person toileting, which includes using toliet, bedpan, or urinal?: Total Help from another person bathing (including washing, rinsing, drying)?: Total Help from another person to put on and taking off regular upper body clothing?: A Lot Help from another person to put on and taking off regular lower body clothing?: Total 6 Click Score: 11   End of Session Nurse Communication: Mobility status;Other (comment)(pt in bedpan)  Activity Tolerance: Patient limited by fatigue Patient left: in bed;with call bell/phone within reach;with bed alarm set  OT Visit Diagnosis: Muscle weakness (generalized) (M62.81);History of falling (Z91.81);Unsteadiness on feet (R26.81)                Time: 1610-9604 OT Time Calculation (min): 13 min Charges:  OT General Charges $OT Visit: 1 Visit OT Evaluation $OT Eval Moderate Complexity: 1 Mod G-Codes:     Lise Auer, OT [phone]  Einar Crow D 05/14/2017, 2:58 PM

## 2017-05-15 ENCOUNTER — Encounter (HOSPITAL_COMMUNITY): Payer: Self-pay

## 2017-05-15 ENCOUNTER — Inpatient Hospital Stay (HOSPITAL_COMMUNITY): Payer: Medicare Other

## 2017-05-15 DIAGNOSIS — I639 Cerebral infarction, unspecified: Secondary | ICD-10-CM

## 2017-05-15 LAB — COMPREHENSIVE METABOLIC PANEL
ALBUMIN: 2.3 g/dL — AB (ref 3.5–5.0)
ALK PHOS: 71 U/L (ref 38–126)
ALT: 26 U/L (ref 14–54)
ANION GAP: 11 (ref 5–15)
AST: 19 U/L (ref 15–41)
BUN: 112 mg/dL — ABNORMAL HIGH (ref 6–20)
CALCIUM: 7.9 mg/dL — AB (ref 8.9–10.3)
CO2: 21 mmol/L — AB (ref 22–32)
Chloride: 120 mmol/L — ABNORMAL HIGH (ref 101–111)
Creatinine, Ser: 2.27 mg/dL — ABNORMAL HIGH (ref 0.44–1.00)
GFR calc Af Amer: 24 mL/min — ABNORMAL LOW (ref >60)
GFR calc non Af Amer: 21 mL/min — ABNORMAL LOW (ref >60)
Glucose, Bld: 108 mg/dL — ABNORMAL HIGH (ref 65–99)
POTASSIUM: 4.3 mmol/L (ref 3.5–5.1)
SODIUM: 152 mmol/L — AB (ref 135–145)
Total Bilirubin: 0.4 mg/dL (ref 0.3–1.2)
Total Protein: 6.1 g/dL — ABNORMAL LOW (ref 6.5–8.1)

## 2017-05-15 LAB — URINE CULTURE

## 2017-05-15 LAB — CBC
HEMATOCRIT: 34.8 % — AB (ref 36.0–46.0)
Hemoglobin: 10.6 g/dL — ABNORMAL LOW (ref 12.0–15.0)
MCH: 29.4 pg (ref 26.0–34.0)
MCHC: 30.5 g/dL (ref 30.0–36.0)
MCV: 96.4 fL (ref 78.0–100.0)
Platelets: 243 10*3/uL (ref 150–400)
RBC: 3.61 MIL/uL — AB (ref 3.87–5.11)
RDW: 16.3 % — ABNORMAL HIGH (ref 11.5–15.5)
WBC: 11.9 10*3/uL — AB (ref 4.0–10.5)

## 2017-05-15 LAB — MAGNESIUM: Magnesium: 2.4 mg/dL (ref 1.7–2.4)

## 2017-05-15 LAB — GLUCOSE, CAPILLARY
GLUCOSE-CAPILLARY: 132 mg/dL — AB (ref 65–99)
GLUCOSE-CAPILLARY: 186 mg/dL — AB (ref 65–99)
Glucose-Capillary: 92 mg/dL (ref 65–99)
Glucose-Capillary: 95 mg/dL (ref 65–99)
Glucose-Capillary: 162 mg/dL — ABNORMAL HIGH (ref 65–99)

## 2017-05-15 LAB — CK: Total CK: 120 U/L (ref 38–234)

## 2017-05-15 MED ORDER — FUROSEMIDE 10 MG/ML IJ SOLN
40.0000 mg | Freq: Once | INTRAMUSCULAR | Status: AC
  Administered 2017-05-15: 40 mg via INTRAVENOUS
  Filled 2017-05-15: qty 4

## 2017-05-15 MED ORDER — CEPHALEXIN 500 MG PO CAPS
500.0000 mg | ORAL_CAPSULE | Freq: Two times a day (BID) | ORAL | Status: DC
  Administered 2017-05-15 – 2017-05-18 (×7): 500 mg via ORAL
  Filled 2017-05-15 (×7): qty 1

## 2017-05-15 NOTE — Progress Notes (Signed)
PROGRESS NOTE    Megan Atkinson  GUY:[id number] DOB: 07-23-1950 DOA: 05/12/2017 PCP: Anne Ng, NP  Outpatient Specialists:   Brief Narrative:  67 year old female with history of obesity, CAD, Hypertension, Diabetes Mellitus type 2, CVA 2018, MI 2012 status post CABG on Plavix, diastolic CHF, hyperlipidemia. Patient was found at home in wheelchair by friend, unable to get up or move due to weakness. Presented to ED with progressive weakness over past week and was grossly dehydrated. Admitted with hypothermia, leukocytosis, and altered mental status on working diagnosis of dehydration resulting in acute renal failure. Patient was diagnosed with UTI and treated empirically. MRI performed 4/8 showed acute infarction within left cerebellum, indicating possible repeat CVA.  Assessment & Plan:   1. Possible CVA -altered mental status at admission, CT head suggested possible progression of old left cerebellar stroke - MRI brain showed areas of acute infarction within the left cerebellum, adjacent to the areas of infarction that were in acute in Jan 2018. No apparent mass effect or hemorrhage - Transfer patient to [facility] for Neurology consult and admit to telemetry to monitor ischemic changes - Pt received Plavix, lipitor, and aspirin this morning - lipid panel ordered  2. UTI/possible sepsis - admitted with hypothermia and leukocytosis, procalcitonin 7.7 on admission, lactic acid 1.1 on admission - WBC at 11.9, decreased from 20.6 at admission - blood cultures pending negative - Urine culture showed E. Coli, discontinued IV Rocephin, started po Keflex  3. Acute Kidney Injury - baseline Cr 0.8, Cr today 2.27, trending down - Continue IV fluids, avoid nephrotoxic agents  4. Hypernatremia - Na is 152, continue IV fluids  5. Generalized weakness/decreased activity tolerance - Most likely due to new infarction within left cerebellum  6. Morbid obesity/Obstructive  Sleep Apnea - patient noncompliant with CPAP at home  7. HTN/CAD - Continue amlodipine, metoprolol  8. DM Type II - last A1C is 8.0 - continue Novolog/humalog   DVT prophylaxis: Heparin Code Status: Full Family Communication: Family not at bedside Disposition Plan: Discharged when clinically stable   Consultants:   Neurology  Procedures:   Echocardiogram 4/7  Antimicrobials:   Vancomycin 4/7-4/8  Zosyn 4/7-4/8  Rocephin 4/8-   Subjective: Patient is alert and awake, oriented to person, place, and time. She has no complaints at this time.   Objective: Vitals:   05/14/17 1323 05/14/17 1324 05/14/17 2005 05/15/17 0506  BP:  (!) 147/40 (!) 139/58 (!) 112/38  Pulse:  70 77 64  Resp:  20 15 17   Temp:  98.5 F (36.9 C) 97.6 F (36.4 C) 97.6 F (36.4 C)  TempSrc:  Oral Oral Oral  SpO2: 92% 94% 100% 100%  Weight:      Height:        Intake/Output Summary (Last 24 hours) at 05/15/2017 1217 Last data filed at 05/15/2017 1000 Gross per 24 hour  Intake 3010 ml  Output 1200 ml  Net 1810 ml   Filed Weights   05/12/17 1929 05/13/17 0202 05/13/17 0343  Weight: (!) 162.4 kg (358 lb) (!) 168.7 kg (372 lb) (!) 168.7 kg (372 lb)    Examination:  General exam: Appears calm and comfortable, in no acute distress Respiratory system: Clear to auscultation. Respiratory effort normal on 2L nasal canula.  Cardiovascular system: S1 & S2 heard, RRR. No JVD, murmurs, rubs, gallops or clicks.  Gastrointestinal system: Abdomen is nondistended, soft and nontender.  Central nervous system: Alert and oriented. No focal neurological deficits. Skin: No rashes,  lesions or ulcers Psychiatry: Judgement and insight appear normal. Mood & affect appropriate.     Data Reviewed: I have personally reviewed following labs and imaging studies  CBC: Recent Labs  Lab 05/12/17 2023 05/13/17 0759 05/14/17 0459 05/15/17 0536  WBC 20.6* 17.4* 13.9* 11.9*  NEUTROABS 19.6*  --   --   --    HGB 13.5 11.3* 10.9* 10.6*  HCT 40.3 34.9* 34.7* 34.8*  MCV 91.4 91.8 94.0 96.4  PLT 225 208 228 243   Basic Metabolic Panel: Recent Labs  Lab 05/12/17 2023 05/13/17 0214 05/13/17 0759 05/14/17 0459 05/15/17 0536  NA 150* 145 150* 151* 152*  K 4.6 4.3 4.3 4.0 4.3  CL 110 110 114* 118* 120*  CO2 22 19* 23 20* 21*  GLUCOSE 179* 214* 155* 148* 108*  BUN 212* 217* 206* 118* 112*  CREATININE 4.39* 3.94* 3.79* 3.04* 2.27*  CALCIUM 9.6 8.8* 8.9 7.8* 7.9*  MG  --  2.9* 2.8*  --  2.4  PHOS  --  8.7* 8.5*  --   --    GFR: Estimated Creatinine Clearance: 39.1 mL/min (A) (by C-G formula based on SCr of 2.27 mg/dL (H)). Liver Function Tests: Recent Labs  Lab 05/12/17 2023 05/13/17 0759 05/15/17 0536  AST 32 25 19  ALT 36 28 26  ALKPHOS 107 85 71  BILITOT 0.7 0.9 0.4  PROT 7.8 6.3* 6.1*  ALBUMIN 3.1* 2.5* 2.3*   No results for input(s): LIPASE, AMYLASE in the last 168 hours. No results for input(s): AMMONIA in the last 168 hours. Coagulation Profile: No results for input(s): INR, PROTIME in the last 168 hours. Cardiac Enzymes: Recent Labs  Lab 05/12/17 2023 05/13/17 0214 05/13/17 0759 05/13/17 1435 05/15/17 0536  CKTOTAL 505*  --  331*  --  120  TROPONINI 0.05* 0.03* 0.04* 0.03*  --    BNP (last 3 results) No results for input(s): PROBNP in the last 8760 hours. HbA1C: Recent Labs    05/13/17 0759  HGBA1C 8.0*   CBG: Recent Labs  Lab 05/14/17 2006 05/14/17 2340 05/15/17 0443 05/15/17 0754 05/15/17 1149  GLUCAP 128* 115* 92 95 132*   Lipid Profile: No results for input(s): CHOL, HDL, LDLCALC, TRIG, CHOLHDL, LDLDIRECT in the last 72 hours. Thyroid Function Tests: Recent Labs    05/13/17 0759  TSH 2.966   Anemia Panel: No results for input(s): VITAMINB12, FOLATE, FERRITIN, TIBC, IRON, RETICCTPCT in the last 72 hours. Urine analysis:    Component Value Date/Time   COLORURINE RED (A) 05/12/2017 2214   APPEARANCEUR CLOUDY (A) 05/12/2017 2214    LABSPEC 1.014 05/12/2017 2214   PHURINE 8.0 05/12/2017 2214   GLUCOSEU NEGATIVE 05/12/2017 2214   GLUCOSEU NEGATIVE 04/05/2011 1129   HGBUR LARGE (A) 05/12/2017 2214   BILIRUBINUR NEGATIVE 05/12/2017 2214   KETONESUR NEGATIVE 05/12/2017 2214   PROTEINUR 100 (A) 05/12/2017 2214   UROBILINOGEN 0.2 04/05/2011 1129   NITRITE NEGATIVE 05/12/2017 2214   LEUKOCYTESUR LARGE (A) 05/12/2017 2214   Sepsis Labs: @LABRCNTIP (procalcitonin:4,lacticidven:4)  ) Recent Results (from the past 240 hour(s))  Urine Culture     Status: Abnormal   Collection Time: 05/13/17  1:14 AM  Result Value Ref Range Status   Specimen Description   Final    URINE, CLEAN CATCH Performed at [facility], [address], [place], [place] [address]    Special Requests   Final    NONE Performed at [facility], [address] [place], [place] [address]  Culture >=100,000 COLONIES/mL ESCHERICHIA COLI (A)  Final   Report Status 05/15/2017 FINAL  Final   Organism ID, Bacteria ESCHERICHIA COLI (A)  Final      Susceptibility   Escherichia coli - MIC*    AMPICILLIN <=2 SENSITIVE Sensitive     CEFAZOLIN <=4 SENSITIVE Sensitive     CEFTRIAXONE <=1 SENSITIVE Sensitive     CIPROFLOXACIN <=0.25 SENSITIVE Sensitive     GENTAMICIN >=16 RESISTANT Resistant     IMIPENEM <=0.25 SENSITIVE Sensitive     NITROFURANTOIN <=16 SENSITIVE Sensitive     TRIMETH/SULFA <=20 SENSITIVE Sensitive     AMPICILLIN/SULBACTAM <=2 SENSITIVE Sensitive     PIP/TAZO <=4 SENSITIVE Sensitive     Extended ESBL NEGATIVE Sensitive     * >=100,000 COLONIES/mL ESCHERICHIA COLI  MRSA PCR Screening     Status: None   Collection Time: 05/13/17  2:04 AM  Result Value Ref Range Status   MRSA by PCR NEGATIVE NEGATIVE Final    Comment:        The GeneXpert MRSA Assay (FDA approved for NASAL specimens only), is one component of a comprehensive MRSA colonization surveillance program. It is not intended to diagnose  MRSA infection nor to guide or monitor treatment for MRSA infections. Performed at [facility], [address] [place], [place] [address]   Culture, blood (x 2)     Status: None (Preliminary result)   Collection Time: 05/13/17  2:14 AM  Result Value Ref Range Status   Specimen Description   Final    BLOOD RIGHT ANTECUBITAL Performed at [facility], [address] [place], [place] [address]    Special Requests   Final    BOTTLES DRAWN AEROBIC AND ANAEROBIC Blood Culture adequate volume Performed at [facility], [address] [place], [place] [address]    Culture   Final    NO GROWTH 2 DAYS Performed at [facility], [address] [place], [place] [address]    Report Status PENDING  Incomplete  Culture, blood (x 2)     Status: None (Preliminary result)   Collection Time: 05/13/17  2:14 AM  Result Value Ref Range Status   Specimen Description   Final    BLOOD RIGHT HAND Performed at [facility], [address] [place], [place] [address]    Special Requests   Final    BOTTLES DRAWN AEROBIC ONLY Blood Culture adequate volume Performed at [facility], [address], [place], [place] [address]    Culture   Final    NO GROWTH 2 DAYS Performed at [facility], [address] [place], [place] [address]    Report Status PENDING  Incomplete         Radiology Studies: Mr Brain Wo Contrast  Result Date: 05/15/2017 CLINICAL DATA:  Acute presentation with weakness and altered mental status. EXAM: MRI HEAD WITHOUT CONTRAST TECHNIQUE: Multiplanar, multiecho pulse sequences of the brain and surrounding structures were obtained without intravenous contrast. COMPARISON:  CT 05/12/2017.  MRI 03/07/2016. FINDINGS: Brain: There is been extension of acute infarction in the left cerebellum, affecting cerebellar tissue adjacent to the areas of previously seen infarction. No supratentorial  acute infarction. There chronic small-vessel ischemic changes of the hemispheric white matter. Study is abbreviated and motion degraded. No evidence of mass lesion, hydrocephalus or extra-axial collection. Vascular: No vascular information available. Skull and upper cervical spine: No abnormality seen. Sinuses/Orbits: Negative as seen. Other: None  IMPRESSION: Abbreviated and motion degraded exam. Areas of acute infarction within the left cerebellum, adjacent to the areas of infarction that were acute in January of 2018. No apparent mass effect or hemorrhage. Electronically Signed   By: Paulina FusiMark  Shogry M.D.   On: 05/15/2017 08:36   Dg Chest Port 1 View  Result Date: 05/15/2017 CLINICAL DATA:  Hypoxia. History of coronary artery disease and CABG, diabetes, obesity. EXAM: PORTABLE CHEST 1 VIEW COMPARISON:  PA and lateral chest x-ray of May 12, 2017 FINDINGS: There is chronic elevation of the right hemidiaphragm. The left lung is well-expanded. The cardiac silhouette is enlarged. The pulmonary vascularity is mildly engorged and indistinct. The sternal wires are intact. There is no definite pleural effusion. IMPRESSION: Marked elevation of the right hemidiaphragm resulting in loss of approximately 1/2 of the right pleural space volume. Cardiomegaly with mild pulmonary vascular congestion suggests low-grade CHF slightly more conspicuous than on the previous study. No acute pneumonia. Electronically Signed   By: David  SwazilandJordan M.D.   On: 05/15/2017 11:26        Scheduled Meds: . amLODipine  2.5 mg Oral Daily  . aspirin EC  81 mg Oral Daily  . atorvastatin  80 mg Oral q1800  . cephALEXin  500 mg Oral Q12H  . chlorhexidine  15 mL Mouth Rinse BID  . clopidogrel  75 mg Oral Daily  . heparin injection (subcutaneous)  5,000 Units Subcutaneous Q8H  . insulin aspart  0-9 Units Subcutaneous Q4H  . mouth rinse  15 mL Mouth Rinse q12n4p  . metoprolol tartrate  12.5 mg Oral BID   Continuous Infusions:   LOS: 3  days    Time spent: 25 mins    Janine OresBlaine Isaiahs Chancy, MD [facility]  If 7PM-7AM, please contact night-coverage [email or web address] Password [email or web address] 05/15/2017, 12:17 PM

## 2017-05-15 NOTE — Progress Notes (Signed)
Report called to receiving RN and given to carelink. [facility]. Son was notified of patient transfer.

## 2017-05-15 NOTE — Progress Notes (Signed)
Physical Therapy Treatment Patient Details Name: Megan Atkinson MRN: [id number] DOB: 11-27-1950 Today's Date: 05/15/2017    History of Present Illness 67 yo female admitted with AKI, extension of cerebellar infarct. Hx of morbid obesity, CAD, cerebellar CVA January 2018, CHF, DM, vertigo.     PT Comments    Pt somewhat lethargic during PT session, required verbal/tactile cues to keep eyes open and encouragement to participate. +2 total assistance for supine to sit, pt sat at edge of bed x 12 minutes. Instructed pt in BLE/UE exercises for strengthening. SNF recommended.    Follow Up Recommendations  SNF     Equipment Recommendations  None recommended by PT    Recommendations for Other Services       Precautions / Restrictions Precautions Precautions: Fall Precaution Comments: incontinent Restrictions Weight Bearing Restrictions: No    Mobility  Bed Mobility Overal bed mobility: Needs Assistance Bed Mobility: Rolling Rolling: Total assist;+2 for physical assistance   Supine to sit: +2 for physical assistance;+2 for safety/equipment;HOB elevated;Total assist Sit to supine: Total assist;+2 for physical assistance   General bed mobility comments: +2 supine to sit and sit to supine, pt 25%, pt assisted with advancing BLEs towards EOB  Transfers                 General transfer comment: pt refused to attempt sit to stand, pt somewhat lethargic in sitting, VCs required to keep her alert and eyes open, pt reported feeling very tired, max encouragement to participate, sat on edge of bed x 12 minutes  Ambulation/Gait             General Gait Details: unable   Stairs            Wheelchair Mobility    Modified Rankin (Stroke Patients Only)       Balance Overall balance assessment: Needs assistance Sitting-balance support: Feet supported;Single extremity supported Sitting balance-Leahy Scale: Fair                                       Cognition Arousal/Alertness: Lethargic Behavior During Therapy: WFL for tasks assessed/performed Overall Cognitive Status: Within Functional Limits for tasks assessed                                        Exercises General Exercises - Lower Extremity Ankle Circles/Pumps: AROM;Both;20 reps;Supine Long Arc Quad: AAROM;Both;5 reps;Seated Shoulder Exercises Shoulder Flexion: AAROM;Both;5 reps;Supine Other Exercises Other Exercises: shoulder rolls in sitting AROM both x 10    General Comments        Pertinent Vitals/Pain Pain Assessment: No/denies pain    Home Living                      Prior Function            PT Goals (current goals can now be found in the care plan section) Acute Rehab PT Goals Patient Stated Goal: to be able to walk again PT Goal Formulation: With patient Time For Goal Achievement: 05/27/17 Potential to Achieve Goals: Good Progress towards PT goals: Progressing toward goals    Frequency    Min 3X/week      PT Plan Current plan remains appropriate    Co-evaluation  AM-PAC PT "6 Clicks" Daily Activity  Outcome Measure  Difficulty turning over in bed (including adjusting bedclothes, sheets and blankets)?: Unable Difficulty moving from lying on back to sitting on the side of the bed? : Unable Difficulty sitting down on and standing up from a chair with arms (e.g., wheelchair, bedside commode, etc,.)?: Unable Help needed moving to and from a bed to chair (including a wheelchair)?: Total Help needed walking in hospital room?: Total Help needed climbing 3-5 steps with a railing? : Total 6 Click Score: 6    End of Session   Activity Tolerance: Patient limited by fatigue Patient left: in bed;with call bell/phone within reach Nurse Communication: Mobility status;Other (comment)(pt is soiled and needs to be cleaned up) PT Visit Diagnosis: Muscle weakness (generalized) (M62.81);Difficulty in walking,  not elsewhere classified (R26.2);Other abnormalities of gait and mobility (R26.89)     Time: [id number] PT Time Calculation (min) (ACUTE ONLY): 42 min  Charges:  $Therapeutic Exercise: 8-22 mins $Therapeutic Activity: 23-37 mins                    G Codes:          Tamala SerUhlenberg, Ticara Waner Kistler 05/15/2017, 11:34 AM [phone]

## 2017-05-15 NOTE — Progress Notes (Signed)
Spoke with Dr. Edward JollySilva; holding ABG at this time.

## 2017-05-15 NOTE — Progress Notes (Addendum)
[facility]  Patient was not given stroke swallow eval and MD placed patient diet and patient was allowed to eat.  Patient will be kept NPO on arrival  until a complete swallow screen evaluation can be completed.

## 2017-05-15 NOTE — Care Management Important Message (Signed)
Important Message  Patient Details  Name: Megan NielsenBeverley Atkinson MRN: [id number] Date of Birth: [birth year]   Medicare Important Message Given:  Yes    Caren MacadamFuller, Laketta Soderberg 05/15/2017, 12:23 PMImportant Message  Patient Details  Name: Megan NielsenBeverley Atkinson MRN: [id number] Date of Birth: [birth year]   Medicare Important Message Given:  Yes    Caren MacadamFuller, Neidy Guerrieri 05/15/2017, 12:23 PM

## 2017-05-16 ENCOUNTER — Inpatient Hospital Stay (HOSPITAL_COMMUNITY): Payer: Medicare Other

## 2017-05-16 DIAGNOSIS — I6330 Cerebral infarction due to thrombosis of unspecified cerebral artery: Secondary | ICD-10-CM

## 2017-05-16 DIAGNOSIS — I63442 Cerebral infarction due to embolism of left cerebellar artery: Secondary | ICD-10-CM

## 2017-05-16 LAB — COMPREHENSIVE METABOLIC PANEL
ALBUMIN: 2.2 g/dL — AB (ref 3.5–5.0)
ALT: 27 U/L (ref 14–54)
ANION GAP: 13 (ref 5–15)
AST: 22 U/L (ref 15–41)
Alkaline Phosphatase: 72 U/L (ref 38–126)
BUN: 77 mg/dL — AB (ref 6–20)
CHLORIDE: 113 mmol/L — AB (ref 101–111)
CO2: 26 mmol/L (ref 22–32)
Calcium: 8.2 mg/dL — ABNORMAL LOW (ref 8.9–10.3)
Creatinine, Ser: 1.94 mg/dL — ABNORMAL HIGH (ref 0.44–1.00)
GFR calc Af Amer: 29 mL/min — ABNORMAL LOW (ref >60)
GFR calc non Af Amer: 25 mL/min — ABNORMAL LOW (ref >60)
GLUCOSE: 108 mg/dL — AB (ref 65–99)
POTASSIUM: 4.2 mmol/L (ref 3.5–5.1)
SODIUM: 152 mmol/L — AB (ref 135–145)
Total Bilirubin: 0.4 mg/dL (ref 0.3–1.2)
Total Protein: 6.2 g/dL — ABNORMAL LOW (ref 6.5–8.1)

## 2017-05-16 LAB — BASIC METABOLIC PANEL
ANION GAP: 10 (ref 5–15)
ANION GAP: 10 (ref 5–15)
ANION GAP: 7 (ref 5–15)
ANION GAP: 7 (ref 5–15)
BUN: 73 mg/dL — AB (ref 6–20)
BUN: 71 mg/dL — AB (ref 6–20)
BUN: 59 mg/dL — ABNORMAL HIGH (ref 6–20)
BUN: 57 mg/dL — AB (ref 6–20)
CALCIUM: 8 mg/dL — AB (ref 8.9–10.3)
CALCIUM: 8.2 mg/dL — AB (ref 8.9–10.3)
CALCIUM: 8.2 mg/dL — AB (ref 8.9–10.3)
CALCIUM: 8.1 mg/dL — AB (ref 8.9–10.3)
CO2: 25 mmol/L (ref 22–32)
CO2: 25 mmol/L (ref 22–32)
CO2: 27 mmol/L (ref 22–32)
CO2: 26 mmol/L (ref 22–32)
CREATININE: 1.82 mg/dL — AB (ref 0.44–1.00)
Chloride: 114 mmol/L — ABNORMAL HIGH (ref 101–111)
Chloride: 115 mmol/L — ABNORMAL HIGH (ref 101–111)
Chloride: 114 mmol/L — ABNORMAL HIGH (ref 101–111)
Chloride: 114 mmol/L — ABNORMAL HIGH (ref 101–111)
Creatinine, Ser: 1.88 mg/dL — ABNORMAL HIGH (ref 0.44–1.00)
Creatinine, Ser: 1.97 mg/dL — ABNORMAL HIGH (ref 0.44–1.00)
Creatinine, Ser: 1.73 mg/dL — ABNORMAL HIGH (ref 0.44–1.00)
GFR calc Af Amer: 31 mL/min — ABNORMAL LOW (ref >60)
GFR calc Af Amer: 29 mL/min — ABNORMAL LOW (ref >60)
GFR calc Af Amer: 34 mL/min — ABNORMAL LOW (ref >60)
GFR calc non Af Amer: 25 mL/min — ABNORMAL LOW (ref >60)
GFR calc non Af Amer: 29 mL/min — ABNORMAL LOW (ref >60)
GFR, EST AFRICAN AMERICAN: 32 mL/min — AB (ref >60)
GFR, EST NON AFRICAN AMERICAN: 26 mL/min — AB (ref >60)
GFR, EST NON AFRICAN AMERICAN: 27 mL/min — AB (ref >60)
GLUCOSE: 197 mg/dL — AB (ref 65–99)
GLUCOSE: 102 mg/dL — AB (ref 65–99)
GLUCOSE: 158 mg/dL — AB (ref 65–99)
Glucose, Bld: 167 mg/dL — ABNORMAL HIGH (ref 65–99)
POTASSIUM: 4.4 mmol/L (ref 3.5–5.1)
Potassium: 4.3 mmol/L (ref 3.5–5.1)
Potassium: 4.5 mmol/L (ref 3.5–5.1)
Potassium: 4.4 mmol/L (ref 3.5–5.1)
SODIUM: 148 mmol/L — AB (ref 135–145)
Sodium: 149 mmol/L — ABNORMAL HIGH (ref 135–145)
Sodium: 150 mmol/L — ABNORMAL HIGH (ref 135–145)
Sodium: 147 mmol/L — ABNORMAL HIGH (ref 135–145)

## 2017-05-16 LAB — CBC WITH DIFFERENTIAL/PLATELET
BASOS ABS: 0 10*3/uL (ref 0.0–0.1)
BASOS PCT: 0 %
Eosinophils Absolute: 0.3 10*3/uL (ref 0.0–0.7)
Eosinophils Relative: 3 %
HEMATOCRIT: 37 % (ref 36.0–46.0)
Hemoglobin: 10.9 g/dL — ABNORMAL LOW (ref 12.0–15.0)
Lymphocytes Relative: 13 %
Lymphs Abs: 1.4 10*3/uL (ref 0.7–4.0)
MCH: 28.9 pg (ref 26.0–34.0)
MCHC: 29.5 g/dL — AB (ref 30.0–36.0)
MCV: 98.1 fL (ref 78.0–100.0)
MONO ABS: 0.6 10*3/uL (ref 0.1–1.0)
MONOS PCT: 5 %
NEUTROS ABS: 8.9 10*3/uL — AB (ref 1.7–7.7)
NEUTROS PCT: 79 %
Platelets: 249 10*3/uL (ref 150–400)
RBC: 3.77 MIL/uL — ABNORMAL LOW (ref 3.87–5.11)
RDW: 16.5 % — AB (ref 11.5–15.5)
WBC: 11.3 10*3/uL — ABNORMAL HIGH (ref 4.0–10.5)

## 2017-05-16 LAB — GLUCOSE, CAPILLARY
GLUCOSE-CAPILLARY: 126 mg/dL — AB (ref 65–99)
GLUCOSE-CAPILLARY: 181 mg/dL — AB (ref 65–99)
GLUCOSE-CAPILLARY: 101 mg/dL — AB (ref 65–99)
GLUCOSE-CAPILLARY: 188 mg/dL — AB (ref 65–99)
Glucose-Capillary: 139 mg/dL — ABNORMAL HIGH (ref 65–99)

## 2017-05-16 LAB — LIPASE, BLOOD: Lipase: 43 U/L (ref 11–51)

## 2017-05-16 LAB — MAGNESIUM: Magnesium: 2 mg/dL (ref 1.7–2.4)

## 2017-05-16 MED ORDER — SODIUM CHLORIDE 0.45 % IV SOLN
INTRAVENOUS | Status: AC
  Administered 2017-05-16: 50 mL/h via INTRAVENOUS

## 2017-05-16 MED ORDER — INSULIN ASPART 100 UNIT/ML ~~LOC~~ SOLN: 0.0000 [IU] | Freq: Every day | SUBCUTANEOUS | Status: DC

## 2017-05-16 MED ORDER — INSULIN ASPART 100 UNIT/ML ~~LOC~~ SOLN
0.0000 [IU] | Freq: Three times a day (TID) | SUBCUTANEOUS | Status: DC
  Administered 2017-05-17 – 2017-05-18 (×2): 2 [IU] via SUBCUTANEOUS
  Administered 2017-05-18: 1 [IU] via SUBCUTANEOUS

## 2017-05-16 MED ORDER — PREMIER PROTEIN SHAKE
11.0000 [oz_av] | Freq: Two times a day (BID) | ORAL | Status: DC
  Administered 2017-05-17 – 2017-05-18 (×4): 11 [oz_av] via ORAL
  Filled 2017-05-16 (×7): qty 325.31

## 2017-05-16 MED ORDER — SODIUM CHLORIDE 0.45 % IV SOLN: INTRAVENOUS | Status: DC

## 2017-05-16 NOTE — Progress Notes (Signed)
[facility] Progress Note  Patient: Megan Atkinson UXL:[id number]   PCP: Anne Ng, NP DOB: [birth year]   DOA: 05/12/2017   DOS: 05/16/2017   Date of Service: the patient was seen and examined on 05/16/2017  Subjective: Feeling better, breathing better.  There was initially some confusion on admission which currently appears to have resolved.  Denies any chest pain or abdominal pain.  Has some constipation.  Brief hospital course: Pt. with PMH of CAD, hypertension, diabetes mellitus type 2, CVA, MI status post CABG, diastolic CHF, hyperlipidemia; admitted on 05/12/2017, presented with complaint of confusion and fatigue, was found to have UTI with acute kidney injury as well as progression of chronic stroke. Currently further plan is continue treating hyponatremia.  Assessment and Plan: 1.  Acute metabolic encephalopathy. Multifactorial secondary to combination of AKI, hypernatremia, There is also a question of acute on chronic progression of the stroke versus recrudescence from metabolic derangements. Currently getting better patient appears to be back to baseline mentally.  Continue close monitoring.  2.  Sepsis secondary to E. coli UTI. Sepsis physiology resolved, urine culture E. coli Patient initially treated with Zosyn and vancomycin, subsequently switched to Rocephin.  Urine culture shows E. coli pansensitive, on Keflex to complete total of 7 days Continue supportive treatment  Acute kidney injury/hypernatremia Baseline creatinine 0.8 Creatinine on admission 4.39 Likely from infectious process, dehydration Creatinine continues to improve FENa consistent with prerenal azotemia  Patchy left PICA acute ischemic infarct adjacent to previous known infarct with occlusion and left PICA.  MRI shows area of acute infarction within the left cerebellum adjacent to areas of infarction from 2018.  No mass effect or hemorrhage. Echocardiogram was done LVEF 65-70%, grade 1  diastolic dysfunction no wall motion abnormalities. Case discussed with neurology and recommended transfer to [facility] for stroke workup.   Appreciate neurology consultation.  No further workup at present from neurology recommended. On 325 mg aspirin and 75 mg Plavix in the past. Neurology recommends 81 mg aspirin and 75 mg Plavix daily for 3 weeks followed by Plavix alone. Also recommend to allow permissive hypertension.  Acute on chronic diastolic CHF Acute hypoxic respiratory failure,?  Atelectasis S/P IV Lasix x1.  Monitor. Use incentive spirometry.  Generalized weakness PT evaluated recommending SNF, patient was refusing  Hypertension/CAD status post CABG BP stable during hospital stay  holding blood pressure medication for now.    Type 2 diabetes mellitus, uncontrolled. Last A1c 8 4/7 CBGs stable Continue sliding scale  Hypernatremia. Sodium 150, likely from dehydration and poor p.o. intake.  Receiving half normal saline, BMP every 4 hours.  Diet: Cardiac and carb modified diet DVT Prophylaxis: subcutaneous Heparin  Advance goals of care discussion: full code  Family Communication: no family was present at bedside, at the time of interview.  Disposition:  Discharge to SNF friday.  Consultants: neurolgy Procedures: Echocardiogram   Antibiotics: Anti-infectives (From admission, onward)   Start     Dose/Rate Route Frequency Ordered Stop   05/15/17 1200  cephALEXin (KEFLEX) capsule 500 mg     500 mg Oral Every 12 hours 05/15/17 1036 05/20/17 0959   05/15/17 0600  vancomycin (VANCOCIN) IVPB 1000 mg/200 mL premix  Status:  Discontinued     1,000 mg 200 mL/hr over 60 Minutes Intravenous Every 48 hours 05/13/17 0143 05/14/17 1034   05/14/17 1600  cefTRIAXone (ROCEPHIN) 2 g in sodium chloride 0.9 % 100 mL IVPB  Status:  Discontinued     2 g 200 mL/hr over  30 Minutes Intravenous Every 24 hours 05/14/17 1034 05/15/17 1036   05/14/17 0815   piperacillin-tazobactam (ZOSYN) IVPB 3.375 g  Status:  Discontinued     3.375 g 12.5 mL/hr over 240 Minutes Intravenous Every 8 hours 05/14/17 0805 05/14/17 1034   05/13/17 0800  piperacillin-tazobactam (ZOSYN) IVPB 2.25 g  Status:  Discontinued     2.25 g 100 mL/hr over 30 Minutes Intravenous Every 6 hours 05/13/17 0142 05/14/17 0805   05/13/17 0130  vancomycin (VANCOCIN) 2,500 mg in sodium chloride 0.9 % 500 mL IVPB     2,500 mg 250 mL/hr over 120 Minutes Intravenous  Once 05/13/17 0123 05/13/17 0410   05/13/17 0100  piperacillin-tazobactam (ZOSYN) IVPB 3.375 g     3.375 g 100 mL/hr over 30 Minutes Intravenous  Once 05/13/17 0050 05/13/17 0131   05/13/17 0100  vancomycin (VANCOCIN) IVPB 1000 mg/200 mL premix  Status:  Discontinued     1,000 mg 200 mL/hr over 60 Minutes Intravenous  Once 05/13/17 0050 05/13/17 0123       Objective: Physical Exam: Vitals:   05/15/17 2053 05/15/17 2319 05/16/17 0437 05/16/17 0829  BP: (!) 137/57 (!) 171/52 (!) 127/51 (!) 155/60  Pulse: 75 80 69 70  Resp: 20 (!) 22 20 20   Temp: 98.7 F (37.1 C) 98.1 F (36.7 C) 98.3 F (36.8 C) 98.6 F (37 C)  TempSrc:  Oral Oral Oral  SpO2: 100% 100% 100% 100%  Weight:      Height:        Intake/Output Summary (Last 24 hours) at 05/16/2017 1643 Last data filed at 05/15/2017 1900 Gross per 24 hour  Intake 240 ml  Output 1000 ml  Net -760 ml   Filed Weights   05/12/17 1929 05/13/17 0202 05/13/17 0343  Weight: (!) 162.4 kg (358 lb) (!) 168.7 kg (372 lb) (!) 168.7 kg (372 lb)   General: Alert, Awake and Oriented to Time, Place and Person. Appear in mild distress, affect appropriate Eyes: PERRL, Conjunctiva normal ENT: Oral Mucosa clear moist. Neck: difficult to assess JVD, no Abnormal Mass Or lumps Cardiovascular: S1 and S2 Present, no Murmur, Peripheral Pulses Present Respiratory: normal respiratory effort, Bilateral Air entry equal and Decreased, no use of accessory muscle, Clear to Auscultation, no  Crackles, no wheezes Abdomen: Bowel Sound present, Soft and no tenderness, no hernia Skin: no redness, no Rash, no induration Extremities: no Pedal edema, no calf tenderness Neurologic: Grossly no focal neuro deficit. Chronic right sided weakness Data Reviewed: CBC: Recent Labs  Lab 05/12/17 2023 05/13/17 0759 05/14/17 0459 05/15/17 0536 05/16/17 0421  WBC 20.6* 17.4* 13.9* 11.9* 11.3*  NEUTROABS 19.6*  --   --   --  8.9*  HGB 13.5 11.3* 10.9* 10.6* 10.9*  HCT 40.3 34.9* 34.7* 34.8* 37.0  MCV 91.4 91.8 94.0 96.4 98.1  PLT 225 208 228 243 249   Basic Metabolic Panel: Recent Labs  Lab 05/13/17 0214 05/13/17 0759 05/14/17 0459 05/15/17 0536 05/16/17 0421 05/16/17 0847 05/16/17 1158  NA 145 150* 151* 152* 152* 149* 150*  K 4.3 4.3 4.0 4.3 4.2 4.3 4.5  CL 110 114* 118* 120* 113* 114* 115*  CO2 19* 23 20* 21* 26 25 25   GLUCOSE 214* 155* 148* 108* 108* 197* 102*  BUN 217* 206* 118* 112* 77* 73* 71*  CREATININE 3.94* 3.79* 3.04* 2.27* 1.94* 1.88* 1.97*  CALCIUM 8.8* 8.9 7.8* 7.9* 8.2* 8.0* 8.2*  MG 2.9* 2.8*  --  2.4 2.0  --   --  PHOS 8.7* 8.5*  --   --   --   --   --     Liver Function Tests: Recent Labs  Lab 05/12/17 2023 05/13/17 0759 05/15/17 0536 05/16/17 0421  AST 32 25 19 22   ALT 36 28 26 27   ALKPHOS 107 85 71 72  BILITOT 0.7 0.9 0.4 0.4  PROT 7.8 6.3* 6.1* 6.2*  ALBUMIN 3.1* 2.5* 2.3* 2.2*   Recent Labs  Lab 05/16/17 0421  LIPASE 43   No results for input(s): AMMONIA in the last 168 hours. Coagulation Profile: No results for input(s): INR, PROTIME in the last 168 hours. Cardiac Enzymes: Recent Labs  Lab 05/12/17 2023 05/13/17 0214 05/13/17 0759 05/13/17 1435 05/15/17 0536  CKTOTAL 505*  --  331*  --  120  TROPONINI 0.05* 0.03* 0.04* 0.03*  --    BNP (last 3 results) No results for input(s): PROBNP in the last 8760 hours. CBG: Recent Labs  Lab 05/15/17 2040 05/16/17 0016 05/16/17 0353 05/16/17 0744 05/16/17 1140  GLUCAP 162* 139*  126* 181* 101*   Studies: No results found.  Scheduled Meds: . aspirin EC  81 mg Oral Daily  . atorvastatin  80 mg Oral q1800  . cephALEXin  500 mg Oral Q12H  . chlorhexidine  15 mL Mouth Rinse BID  . clopidogrel  75 mg Oral Daily  . heparin injection (subcutaneous)  5,000 Units Subcutaneous Q8H  . insulin aspart  0-5 Units Subcutaneous QHS  . insulin aspart  0-9 Units Subcutaneous TID WC  . mouth rinse  15 mL Mouth Rinse q12n4p  . protein supplement shake  11 oz Oral BID BM   Continuous Infusions: . sodium chloride 50 mL/hr (05/16/17 0906)   PRN Meds: acetaminophen **OR** acetaminophen, HYDROcodone-acetaminophen  Time spent: 35 minutes  Author: Lynden Oxford, MD [facility] Pager: [phone] 05/16/2017 4:43 PM  If 7PM-7AM, please contact night-coverage at [email or web address], password [facility]

## 2017-05-16 NOTE — Consult Note (Addendum)
Requesting Physician: Dr. Allena Katz     Chief Complaint:   History obtained from: Patient and Chart   HPI:                                                                                                                                       Megan Atkinson is an 67 y.o. female with past medical history significant for hypertension, morbid obesity,cerebellar stroke in 2018, diastolic heart failure, diabetes admitted at [facility] after presenting with 4 days history of progressive weakness. She was noted to be severely dehydrated in the emergency room and noted to have acute kidney injury, UTI.  CT head showed progression of old cerebellar stroke and patient ended up obtaining MRI brain which showed extension of prior cerebellar infarct.       Past Medical History:  Diagnosis Date  . CAD (coronary artery disease)    small NSTEMI 11/12: LHC 01/06/11:  LAD 90-95%, mD1 90%, pD2 50%,  pOM1 50%, RCA occluded,  EF > 65%;  CABG  12/12: L-LAD  . Diabetes mellitus   . Glucose intolerance (impaired glucose tolerance)    Hemoglobin A1c 6.4 in 12/2010  . HTN (hypertension)    Echo 01/06/11: EF 65%  . Hyperlipidemia   . Obesity   . Stroke [facility]) 03/06/2016    Past Surgical History:  Procedure Laterality Date  . ABDOMINAL HYSTERECTOMY    . APPENDECTOMY    . CORONARY ARTERY BYPASS GRAFT  01/12/2011   Procedure: CORONARY ARTERY BYPASS GRAFTING (CABG);  Surgeon: Alleen Borne, MD;  Location: [facility] OR;  Service: Open Heart Surgery;  Laterality: N/A;  coronary artery bypass graft times one using left internal mammary artery . Attempted endoscopic saphenous vein harvest  . LEFT HEART CATHETERIZATION WITH CORONARY ANGIOGRAM N/A 01/06/2011   Procedure: LEFT HEART CATHETERIZATION WITH CORONARY ANGIOGRAM;  Surgeon: Rollene Rotunda, MD;  Location: [facility] CATH LAB;  Service: Cardiovascular;  Laterality: N/A;    Family History  Problem Relation Age of Onset  . Coronary artery disease Unknown        No family  history  . Heart disease Mother   . Heart disease Father   . Heart disease Maternal Grandmother    Social History:  reports that she has never smoked. She has never used smokeless tobacco. She reports that she drinks alcohol. She reports that she does not use drugs.  Allergies:  Allergies  Allergen Reactions  . Lisinopril     cough  . Metformin And Related Diarrhea    A lot of fluid loss from bowel and diarrhea    Medications:  I reviewed home medications   ROS:                                                                                                                                     14 systems reviewed and negative except above    Examination:                                                                                                      General: Appears well-developed and well-nourished.  Psych: Affect appropriate to situation Eyes: No scleral injection HENT: No OP obstrucion Head: Normocephalic.  Cardiovascular: Normal rate and regular rhythm.  Respiratory: Effort normal and breath sounds normal to anterior ascultation GI: Soft.  No distension. There is no tenderness.  Skin: WDI   Neurological Examination Mental Status: Alert, oriented, thought content appropriate.  Speech fluent without evidence of aphasia. Able to follow 3 step commands without difficulty. Cranial Nerves: II: Visual fields grossly normal,  III,IV, VI: ptosis not present, extra-ocular motions intact bilaterally, pupils equal, round, reactive to light and accommodation V,VII: smile symmetric, facial light touch sensation normal bilaterally VIII: hearing normal bilaterally IX,X: uvula rises symmetrically XI: bilateral shoulder shrug XII: midline tongue extension Motor: Right : Upper extremity   5/5    Left:     Upper extremity   5/5  Lower extremity   4/5     Lower  extremity   4/5 Tone and bulk:normal tone throughout; no atrophy noted Sensory: Pinprick and light touch intact throughout, bilaterally Deep Tendon Reflexes: 1+ and symmetric throughout Plantars: Right: downgoing   Left: downgoing Cerebellar: normal finger-to-nose, normal rapid alternating movements and normal heel-to-shin test Gait: normal gait and station     Lab Results: Basic Metabolic Panel: Recent Labs  Lab 05/13/17 0214 05/13/17 0759 05/14/17 0459 05/15/17 0536 05/16/17 0421  NA 145 150* 151* 152* 152*  K 4.3 4.3 4.0 4.3 4.2  CL 110 114* 118* 120* 113*  CO2 19* 23 20* 21* 26  GLUCOSE 214* 155* 148* 108* 108*  BUN 217* 206* 118* 112* 77*  CREATININE 3.94* 3.79* 3.04* 2.27* 1.94*  CALCIUM 8.8* 8.9 7.8* 7.9* 8.2*  MG 2.9* 2.8*  --  2.4 2.0  PHOS 8.7* 8.5*  --   --   --     CBC: Recent Labs  Lab 05/12/17 2023 05/13/17 0759 05/14/17 0459 05/15/17 0536 05/16/17 0421  WBC 20.6* 17.4* 13.9* 11.9* 11.3*  NEUTROABS 19.6*  --   --   --  8.9*  HGB 13.5 11.3* 10.9* 10.6* 10.9*  HCT 40.3 34.9* 34.7* 34.8* 37.0  MCV 91.4 91.8 94.0 96.4 98.1  PLT 225 208 228 243 249    Coagulation Studies: No results for input(s): LABPROT, INR in the last 72 hours.  Imaging: Mr Brain Wo Contrast  Result Date: 05/15/2017 CLINICAL DATA:  Acute presentation with weakness and altered mental status. EXAM: MRI HEAD WITHOUT CONTRAST TECHNIQUE: Multiplanar, multiecho pulse sequences of the brain and surrounding structures were obtained without intravenous contrast. COMPARISON:  CT 05/12/2017.  MRI 03/07/2016. FINDINGS: Brain: There is been extension of acute infarction in the left cerebellum, affecting cerebellar tissue adjacent to the areas of previously seen infarction. No supratentorial acute infarction. There chronic small-vessel ischemic changes of the hemispheric white matter. Study is abbreviated and motion degraded. No evidence of mass lesion, hydrocephalus or extra-axial collection.  Vascular: No vascular information available. Skull and upper cervical spine: No abnormality seen. Sinuses/Orbits: Negative as seen. Other: None IMPRESSION: Abbreviated and motion degraded exam. Areas of acute infarction within the left cerebellum, adjacent to the areas of infarction that were acute in January of 2018. No apparent mass effect or hemorrhage. Electronically Signed   By: Paulina FusiMark  Shogry M.D.   On: 05/15/2017 08:36   Dg Chest Port 1 View  Result Date: 05/15/2017 CLINICAL DATA:  Hypoxia. History of coronary artery disease and CABG, diabetes, obesity. EXAM: PORTABLE CHEST 1 VIEW COMPARISON:  PA and lateral chest x-ray of May 12, 2017 FINDINGS: There is chronic elevation of the right hemidiaphragm. The left lung is well-expanded. The cardiac silhouette is enlarged. The pulmonary vascularity is mildly engorged and indistinct. The sternal wires are intact. There is no definite pleural effusion. IMPRESSION: Marked elevation of the right hemidiaphragm resulting in loss of approximately 1/2 of the right pleural space volume. Cardiomegaly with mild pulmonary vascular congestion suggests low-grade CHF slightly more conspicuous than on the previous study. No acute pneumonia. Electronically Signed   By: David  SwazilandJordan M.D.   On: 05/15/2017 11:26     ASSESSMENT AND PLAN  67 y.o. female with past medical history significant for hypertension, morbid obesity,cerebellar stroke in 2018, diastolic heart failure, diabetes presents with progressive weakness for 4 days. Noted to be dehydrated with AKI. Also noted to be hypotensive. MRI brain shows extension of old cerebellar infarct.   Extension of old cerebellar infarct from PICA occlusion- likely from hypotension  Decline and inability to walk - multifactorial including dehydration, morbid obesity, , deconditioning and metabolic encephalopathy.  Recommendations Echo- 65 to 70 pc EF, no dilated LA Consider loop monitor and TEE PT/OT   Megan Atkinson [facility] Pager Number [phone]

## 2017-05-16 NOTE — Progress Notes (Addendum)
Patient has several stage 2 pressure ulcers: right and left buttocks medial and left and right thighs inner.  Patient was kept NPO until patient successfully passed swallow screen evaluation.

## 2017-05-16 NOTE — Progress Notes (Signed)
Initial Nutrition Assessment  DOCUMENTATION CODES:   Obesity unspecified  INTERVENTION:  Premier Protein BID, each supplement provides 160 calories and 30 grams of protein  MVI w/ Minerals  NUTRITION DIAGNOSIS:   Inadequate oral intake related to decreased appetite as evidenced by per patient/family report.  GOAL:   Patient will meet greater than or equal to 90% of their needs  MONITOR:   PO intake, I & O's, Supplement acceptance, Skin  REASON FOR ASSESSMENT:   Low Braden    ASSESSMENT:   Megan Atkinson is an 67 y.o. female with past medical history significant for hypertension, morbid obesity,cerebellar stroke in 2018, diastolic heart failure, diabetes admitted at [facility] after presenting with 4 days history of progressive weakness. She was noted to be severely dehydrated in the emergency room and noted to have acute kidney injury, UTI. Found to have an extension of previous cerebellar infarct  Spoke with patient at bedside. She reports good PO intake PTA eating 3 meals a day, denies weight loss, UBW of 345-350 pounds or so. She has multiple wounds that she says cause her a lot of pain when cleaned and therapy is done. This causes her not to want to eat, but she is open to drinking. Denies any issues with nausea/vomiting or chewing/swallowing problems. Meal completion 0-75%; seems to vary.  Labs reviewed:  Na 150, BUN/Creatinine 71/1.97 CBGs 101, 181, 126  Medications reviewed and include:  Insulin 1/2 NS at 4650mL/hr  NUTRITION - FOCUSED PHYSICAL EXAM:    Most Recent Value  Orbital Region  No depletion  Upper Arm Region  No depletion  Thoracic and Lumbar Region  No depletion  Buccal Region  No depletion  Temple Region  No depletion  Clavicle Bone Region  No depletion  Clavicle and Acromion Bone Region  No depletion  Scapular Bone Region  No depletion  Dorsal Hand  No depletion  Patellar Region  No depletion  Anterior Thigh Region  No depletion  Posterior  Calf Region  No depletion       Diet Order:  Diet Carb Modified Fluid consistency: Thin; Room service appropriate? Yes  EDUCATION NEEDS:   Not appropriate for education at this time  Skin:  Skin Assessment: Skin Integrity Issues: Skin Integrity Issues:: Other (Comment) Other: MSAD to L Buttocks  Last BM:  05/15/2017  Height:   Ht Readings from Last 1 Encounters:  05/13/17 5\' 7"  (1.702 m)    Weight:   Wt Readings from Last 1 Encounters:  05/13/17 (!) 372 lb (168.7 kg)    Ideal Body Weight:  61.36 kg  BMI:  Body mass index is 58.26 kg/m.  Estimated Nutritional Needs:   Kcal:  1800-2000 calories  Protein:  100-120 grams  Fluid:  1.8-2.1L (30-35 ml/kg IBW)  Dionne AnoWilliam M. Ramon Zanders, MS, RD LDN Inpatient Clinical Dietitian Pager [phone]

## 2017-05-16 NOTE — Progress Notes (Signed)
Carotid duplex prelim: 1-39% ICA stenosis.  Trayon Krantz Eunice, RDMS, RVT   

## 2017-05-16 NOTE — [place] FL2 (Signed)
[place] MEDICAID FL2 LEVEL OF CARE SCREENING TOOL     IDENTIFICATION  Patient Name: Megan Atkinson Birthdate: [birth year] Sex: female Admission Date (Current Location): 05/12/2017  [facility] and [place] Number:  Producer, television/film/video and Address:  [facility] [place], [address], [place], [place] [address]      Provider Number: [id number]  Attending Physician Name and Address:  Rolly Salter, MD  Relative Name and Phone Number:       Current Level of Care: Hospital Recommended Level of Care: Skilled Nursing Facility Prior Approval Number:    Date Approved/Denied:   PASRR Number: [id number]  Discharge Plan: SNF    Current Diagnoses: Patient Active Problem List   Diagnosis Date Noted  . Cerebral thrombosis with cerebral infarction 05/16/2017  . SIRS (systemic inflammatory response syndrome) (HCC) 05/13/2017  . Acute lower UTI 05/13/2017  . Acute kidney injury (HCC) 05/12/2017  . Elevated troponin 05/12/2017  . Dehydration 05/12/2017  . Hypernatremia 05/12/2017  . Elevated CK 05/12/2017  . Prolonged QT interval 05/12/2017  . Leukocytosis 05/12/2017  . Acute renal failure (ARF) (HCC) 05/12/2017  . Acute blood loss anemia   . Elevated serum creatinine   . Slow transit constipation   . Benign essential HTN   . Generalized anxiety disorder   . Gait disturbance, post-stroke 03/13/2016  . Cerebellar stroke, acute (HCC) 03/10/2016  . Super obese   . Prediabetes   . Ataxia, post-stroke   . Acute cystitis without hematuria   . CVA (cerebral vascular accident) (HCC) 03/06/2016  . Morbid obesity (HCC) 02/29/2016  . OSA (obstructive sleep apnea) 11/24/2014  . Dyspnea 10/02/2014  . Anemia 04/05/2011  . Urinary tract infection, site not specified 04/05/2011  . Diabetes mellitus (HCC) 02/22/2011  . Hyperlipidemia 02/22/2011  . CAD (coronary artery disease) 01/07/2011  . Hypertension 01/07/2011    Orientation RESPIRATION BLADDER Height & Weight     Self, Time, Situation, Place  O2([place] 3L) Incontinent, External catheter(catheter placed 05/12/17) Weight: (!) 372 lb (168.7 kg) Height:  5\' 7"  (170.2 cm)  BEHAVIORAL SYMPTOMS/MOOD NEUROLOGICAL BOWEL NUTRITION STATUS      Incontinent Diet(carb modified)  AMBULATORY STATUS COMMUNICATION OF NEEDS Skin   Extensive Assist Verbally Other (Comment)(open wound buttocks, left and right, related to MASD; foam dressing changed PRN)                       Personal Care Assistance Level of Assistance  Bathing, Feeding, Dressing Bathing Assistance: Maximum assistance Feeding assistance: Limited assistance Dressing Assistance: Maximum assistance     Functional Limitations Info  Sight, Hearing, Speech Sight Info: Adequate Hearing Info: Adequate Speech Info: Adequate    SPECIAL CARE FACTORS FREQUENCY  PT (By licensed PT), OT (By licensed OT)     PT Frequency: 5x/wk OT Frequency: 5x/wk            Contractures Contractures Info: Not present    Additional Factors Info  Code Status, Allergies, Insulin Sliding Scale Code Status Info: Full Allergies Info: Lisinopril, Metformin And Related   Insulin Sliding Scale Info: 0-9 units 3x/day with meals, 0-5 units daily at bed       Current Medications (05/16/2017):  This is the current hospital active medication list Current Facility-Administered Medications  Medication Dose Route Frequency Provider Last Rate Last Dose  . 0.45 % sodium chloride infusion   Intravenous Continuous Rolly Salter, MD 50 mL/hr at 05/16/17 0906 50 mL/hr at 05/16/17 0906  . acetaminophen (  TYLENOL) tablet 650 mg  650 mg Oral Q6H PRN Randel PiggSilva Zapata, Dorma RussellEdwin, MD       Or  . acetaminophen (TYLENOL) suppository 650 mg  650 mg Rectal Q6H PRN Randel PiggSilva Zapata, Dorma RussellEdwin, MD      . aspirin EC tablet 81 mg  81 mg Oral Daily Randel PiggSilva Zapata, Dorma RussellEdwin, MD   81 mg at 05/16/17 0858  . atorvastatin (LIPITOR) tablet 80 mg  80 mg Oral q1800 Randel PiggSilva Zapata, Dorma RussellEdwin, MD   80 mg at 05/15/17 1819  .  cephALEXin (KEFLEX) capsule 500 mg  500 mg Oral Q12H Randel PiggSilva Zapata, Dorma RussellEdwin, MD   500 mg at 05/16/17 0859  . chlorhexidine (PERIDEX) 0.12 % solution 15 mL  15 mL Mouth Rinse BID Randel PiggSilva Zapata, Dorma RussellEdwin, MD   15 mL at 05/16/17 0859  . clopidogrel (PLAVIX) tablet 75 mg  75 mg Oral Daily Randel PiggSilva Zapata, Dorma RussellEdwin, MD   75 mg at 05/16/17 0858  . heparin injection 5,000 Units  5,000 Units Subcutaneous Q8H Randel PiggSilva Zapata, Dorma RussellEdwin, MD   5,000 Units at 05/16/17 [phone]  . HYDROcodone-acetaminophen (NORCO/VICODIN) 5-325 MG per tablet 1-2 tablet  1-2 tablet Oral Q4H PRN Randel PiggSilva Zapata, Dorma RussellEdwin, MD   2 tablet at 05/16/17 0049  . insulin aspart (novoLOG) injection 0-5 Units  0-5 Units Subcutaneous QHS Lynden OxfordPatel, Pranav M, MD      . insulin aspart (novoLOG) injection 0-9 Units  0-9 Units Subcutaneous TID WC Rolly SalterPatel, Pranav M, MD      . MEDLINE mouth rinse  15 mL Mouth Rinse q12n4p Randel PiggSilva Zapata, Dorma RussellEdwin, MD   15 mL at 05/16/17 1200  . protein supplement (PREMIER PROTEIN) liquid  11 oz Oral BID BM Rolly SalterPatel, Pranav M, MD         Discharge Medications: Please see discharge summary for a list of discharge medications.  Relevant Imaging Results:  Relevant Lab Results:   Additional Information SS#: [id number]  Baldemar LenisElizabeth M Buckley Bradly, LCSW

## 2017-05-16 NOTE — Progress Notes (Addendum)
STROKE TEAM PROGRESS NOTE   SUBJECTIVE (INTERVAL HISTORY) No family is at the bedside.  States she was doing ok at home until she got the UTI. Now, feels she is deconditioned. She lives is anxious to be able to take care of herself again.  Stable overnight. No new complaints.    OBJECTIVE Vitals:   05/15/17 2053 05/15/17 2319 05/16/17 0437 05/16/17 0829  BP: (!) 137/57 (!) 171/52 (!) 127/51 (!) 155/60  Pulse: 75 80 69 70  Resp: 20 (!) 22 20 20   Temp: 98.7 F (37.1 C) 98.1 F (36.7 C) 98.3 F (36.8 C) 98.6 F (37 C)  TempSrc:  Oral Oral Oral  SpO2: 100% 100% 100% 100%  Weight:      Height:        CBC:  Recent Labs  Lab 05/12/17 2023  05/15/17 0536 05/16/17 0421  WBC 20.6*   < > 11.9* 11.3*  NEUTROABS 19.6*  --   --  8.9*  HGB 13.5   < > 10.6* 10.9*  HCT 40.3   < > 34.8* 37.0  MCV 91.4   < > 96.4 98.1  PLT 225   < > 243 249   < > = values in this interval not displayed.    Basic Metabolic Panel:  Recent Labs  Lab 05/13/17 0214 05/13/17 0759  05/15/17 0536 05/16/17 0421 05/16/17 0847 05/16/17 1158  NA 145 150*   < > 152* 152* 149* 150*  K 4.3 4.3   < > 4.3 4.2 4.3 4.5  CL 110 114*   < > 120* 113* 114* 115*  CO2 19* 23   < > 21* 26 25 25   GLUCOSE 214* 155*   < > 108* 108* 197* 102*  BUN 217* 206*   < > 112* 77* 73* 71*  CREATININE 3.94* 3.79*   < > 2.27* 1.94* 1.88* 1.97*  CALCIUM 8.8* 8.9   < > 7.9* 8.2* 8.0* 8.2*  MG 2.9* 2.8*  --  2.4 2.0  --   --   PHOS 8.7* 8.5*  --   --   --   --   --    < > = values in this interval not displayed.    Lipid Panel:     Component Value Date/Time   CHOL 143 02/14/2017 1135   TRIG 90 02/14/2017 1135   HDL 47 02/14/2017 1135   CHOLHDL 3.0 02/14/2017 1135   CHOLHDL 4.2 03/07/2016 0307   VLDL 12 03/07/2016 0307   LDLCALC 78 02/14/2017 1135   HgbA1c:  Lab Results  Component Value Date   HGBA1C 8.0 (H) 05/13/2017   Urine Drug Screen:     Component Value Date/Time   LABOPIA NONE DETECTED 03/07/2016 2312    COCAINSCRNUR NONE DETECTED 03/07/2016 2312   LABBENZ NONE DETECTED 03/07/2016 2312   AMPHETMU NONE DETECTED 03/07/2016 2312   THCU NONE DETECTED 03/07/2016 2312   LABBARB NONE DETECTED 03/07/2016 2312    Alcohol Level     Component Value Date/Time   ETH <5 03/06/2016 1217    IMAGING  Mr Brain Wo Contrast  Result Date: 05/15/2017 CLINICAL DATA:  Acute presentation with weakness and altered mental status. EXAM: MRI HEAD WITHOUT CONTRAST TECHNIQUE: Multiplanar, multiecho pulse sequences of the brain and surrounding structures were obtained without intravenous contrast. COMPARISON:  CT 05/12/2017.  MRI 03/07/2016. FINDINGS: Brain: There is been extension of acute infarction in the left cerebellum, affecting cerebellar tissue adjacent to the areas of previously seen infarction. No supratentorial  acute infarction. There chronic small-vessel ischemic changes of the hemispheric white matter. Study is abbreviated and motion degraded. No evidence of mass lesion, hydrocephalus or extra-axial collection. Vascular: No vascular information available. Skull and upper cervical spine: No abnormality seen. Sinuses/Orbits: Negative as seen. Other: None IMPRESSION: Abbreviated and motion degraded exam. Areas of acute infarction within the left cerebellum, adjacent to the areas of infarction that were acute in January of 2018. No apparent mass effect or hemorrhage. Electronically Signed   By: Paulina Fusi M.D.   On: 05/15/2017 08:36   Dg Chest Port 1 View  Result Date: 05/15/2017 CLINICAL DATA:  Hypoxia. History of coronary artery disease and CABG, diabetes, obesity. EXAM: PORTABLE CHEST 1 VIEW COMPARISON:  PA and lateral chest x-ray of May 12, 2017 FINDINGS: There is chronic elevation of the right hemidiaphragm. The left lung is well-expanded. The cardiac silhouette is enlarged. The pulmonary vascularity is mildly engorged and indistinct. The sternal wires are intact. There is no definite pleural effusion.  IMPRESSION: Marked elevation of the right hemidiaphragm resulting in loss of approximately 1/2 of the right pleural space volume. Cardiomegaly with mild pulmonary vascular congestion suggests low-grade CHF slightly more conspicuous than on the previous study. No acute pneumonia. Electronically Signed   By: David  [place] M.D.   On: 05/15/2017 11:26   Carotid Doppler   There is 1-39% bilateral ICA stenosis. Vertebral artery flow is antegrade.   2D Echocardiogram  - Left ventricle: The cavity size was normal. Wall thickness was normal. Systolic function was vigorous. The estimated ejection fraction was in the range of 65% to 70%. Wall motion was normal; there were no regional wall motion abnormalities. Doppler parameters are consistent with abnormal left ventricular relaxation (grade 1 diastolic dysfunction). - Mitral valve: Calcified annulus. - Pulmonary arteries: Systolic pressure was mildly increased. PA peak pressure: 45 mm Hg (S). Impressions:   Hyperdynamic LV systolic function; intracavitary gradient of approximately 3.5 m/s; mild diastolic dysfunction; mild TR with mild pulmonary hypertension.   PHYSICAL EXAM General: Appears well-developed and well-nourished.  Psych: Affect appropriate to situation Eyes: No scleral injection HENT: No OP obstrucion Head: Normocephalic.  Cardiovascular: Normal rate and regular rhythm.  Respiratory: Effort normal and breath sounds normal to anterior ascultation Skin: WDI  Neurological Examination Mental Status: Alert, oriented, thought content appropriate.  Speech fluent without evidence of aphasia. Able to follow 3 step commands without difficulty. Cranial Nerves: II: Visual fields grossly normal,  III,IV, VI: ptosis not present, extra-ocular motions intact bilaterally, pupils equal, round, reactive to light and accommodation V,VII: smile symmetric, facial light touch sensation normal bilaterally VIII: hearing normal bilaterally IX,X: uvula rises  symmetrically XI: bilateral shoulder shrug XII: midline tongue extension Motor: Right :  Upper extremity   5/5                                      Left:     Upper extremity   4/5             Lower extremity   4/5                                                  Lower extremity   4/5 Tone and bulk:normal tone throughout; no atrophy noted Sensory: Pinprick and  light touch intact throughout, bilaterally Deep Tendon Reflexes: 1+ and symmetric throughout Plantars: Right: downgoing                                Left: downgoing Cerebellar: normal finger-to-nose, normal rapid alternating movements and normal heel-to-shin test Gait: deferred   ASSESSMENT/PLAN Ms. Megan NielsenBeverley Atkinson is a 67 y.o. female with history of hypertension, morbid obesity,cerebellar stroke in 2018, diastolic heart failure, diabetes [facility] after presenting with 4 days history of progressive weakness. She was noted to be severely dehydrated in the emergency room and noted to have acute kidney injury, UTI. CT head showed progression of old cerebellar stroke and patient ended up obtaining MRI brain which showed extension of prior cerebellar infarct.   Stroke:   Patchy L  PICA infarct adjacent to previous L PICA infarct with known L PICA occlusion in Jan 2018. Infarct secondary to dehydration and hypotension in setting of UTI  Resultant  General deconditioning and worsening of previous hemiparesis  CT head old L cerebellar infarct. Possible progression  MRI head acute L cerebellar infarct adjacent to previous acute infarcts from Jan 2018  Carotid Doppler  B ICA 1-39% stenosis, VAs antegrade   2D Echo  EF 65-70%. No source of embolus   LDL 78 in Jan  HgbA1c 8.0  Heparin 5000 units sq tid for VTE prophylaxis  Diet Carb Modified Fluid consistency: Thin; Room service appropriate? Yes   Had been on aspirin 325 mg daily and clopidogrel 75 mg daily with previous stroke but had stopped taking plavix after 3  months. On aspirin alone prior to admission, now on aspirin 81 mg daily and clopidogrel 75 mg daily. Given mild stroke, recommend aspirin 81 mg and plavix 75 mg daily x 3 weeks, then plavix alone.   Patient counseled to be compliant with her antithrombotic medications  Ongoing aggressive stroke risk factor management  Therapy recommendations:  Home vs SNF  Disposition:  pending  Nothing further to add from stroke standpoint. Follow up in the office in 4 weeks. Orders placed.  Hypertension  Stable . Permissive hypertension (OK if < 220/120) but gradually normalize in 5-7 days . Long-term BP goal normotensive  Hyperlipidemia  Home meds:  lipitor 80, resumed in hospital  LDL 78, goal < 70  Continue statin at discharge  Diabetes type II  HgbA1c 8.0, goal < 7.0  Uncontrolled  CBGs  SSI  Other Stroke Risk Factors  Advanced age  ETOH use  Morbid Obesity, Body mass index is 58.26 kg/m., recommend weight loss, diet and exercise as appropriate   Hx stroke/TIA  02/2016 Left PICA territory infarcts embolic secondary to left PICA occlusion. Likely due to atherosclerosis with multiple stroke risk factors  Coronary artery disease  Obstructive sleep apnea, on CPAP at home  Other Active Problems  Chronic B LE edema  AKI  Possible sepsis - Leukocytosis  Dehydration/Hypernatremia  Diffuse weakness  Hospital day # 4  Annie MainSharon Biby, MSN, APRN, ANVP-BC, AGPCNP-BC Advanced Practice Stroke Nurse [facility] See Amion for Schedule & Pager information 05/16/2017 2:52 PM   ATTENDING NOTE: I reviewed above note and agree with the assessment and plan. I have made any additions or clarifications directly to the above note. Pt was seen and examined.   67 year old female with history of CAD, diabetes, hypertension, hyperlipidemia, obesity, OSA admitted for generalized weakness, functional decline, AKI, dehydration.  Patient had stroke in 02/2016  with vertigo,  left hand numbness and ataxia.  MRI showed left PICA infarct including left cerebellum and left medulla.  CTA left PICA occlusion and left VA stenosis.  EF 60-65%, no DVT, LDL 137 and A1c 6.3.  Put on DA PT for 3 months and Lipitor 80.  On this admission, patient had functional decline, generalized weakness, dehydration, shortness of breath, AK I, hyponatremia, UTI with positive E. coli culture.  Currently on IV fluid and antibiotics.  AKI and dehydration improving.  MRI showed left cerebellar small patchy infarct adjacent to previous left cerebellum infarct, likely due to hypoperfusion, hypotension, dehydration in the setting of previous left PICA occlusion.  A1c 8.0 LDL 78, EF 65-70%.  Needs better control stroke risk factors including diabetes control and weight loss, continue to manage dehydration and AK I, avoid hypotension.  Recommend aspirin 81 and Plavix for 3 weeks and then Plavix alone.  No further stroke workup necessary.  Neurology will sign off. Please call with questions. Pt will follow up with stroke clinic NP at [facility] in about 4 weeks. Thanks for the consult.   Marvel Plan, MD PhD Stroke Neurology 05/16/2017 10:13 PM   To contact Stroke Continuity provider, please refer to [email or web address]. After hours, contact General Neurology

## 2017-05-16 NOTE — Care Management Note (Signed)
Case Management Note  Patient Details  Name: Sunnie NielsenBeverley Testerman MRN: [id number] Date of Birth: [birth year]  Subjective/Objective:   Pt admitted with CVA.  She is from home with spouse.               Action/Plan: Recommendation are for SNF. CM following for d/c disposition.   Expected Discharge Date:  05/16/17               Expected Discharge Plan:  Skilled Nursing Facility  In-House Referral:  Clinical Social Work  Discharge planning Services     Post Acute Care Choice:    Choice offered to:     DME Arranged:    DME Agency:     HH Arranged:    HH Agency:     Status of Service:  In process, will continue to follow  If discussed at Long Length of Stay Meetings, dates discussed:    Additional Comments:  Kermit BaloKelli F Chiron Campione, RN 05/16/2017, 1:27 PM

## 2017-05-16 NOTE — Progress Notes (Signed)
Paged [facility] and neuro (Dr. Laurence SlateAroor) to let them know patient has arrived [facility].

## 2017-05-17 DIAGNOSIS — R6510 Systemic inflammatory response syndrome (SIRS) of non-infectious origin without acute organ dysfunction: Secondary | ICD-10-CM

## 2017-05-17 LAB — COMPREHENSIVE METABOLIC PANEL
ALT: 36 U/L (ref 14–54)
AST: 33 U/L (ref 15–41)
Albumin: 2.2 g/dL — ABNORMAL LOW (ref 3.5–5.0)
Alkaline Phosphatase: 69 U/L (ref 38–126)
Anion gap: 6 (ref 5–15)
BILIRUBIN TOTAL: 0.4 mg/dL (ref 0.3–1.2)
BUN: 48 mg/dL — AB (ref 6–20)
CO2: 27 mmol/L (ref 22–32)
CREATININE: 1.66 mg/dL — AB (ref 0.44–1.00)
Calcium: 8.1 mg/dL — ABNORMAL LOW (ref 8.9–10.3)
Chloride: 114 mmol/L — ABNORMAL HIGH (ref 101–111)
GFR, EST AFRICAN AMERICAN: 36 mL/min — AB (ref >60)
GFR, EST NON AFRICAN AMERICAN: 31 mL/min — AB (ref >60)
Glucose, Bld: 114 mg/dL — ABNORMAL HIGH (ref 65–99)
Potassium: 4.3 mmol/L (ref 3.5–5.1)
Sodium: 147 mmol/L — ABNORMAL HIGH (ref 135–145)
TOTAL PROTEIN: 5.9 g/dL — AB (ref 6.5–8.1)

## 2017-05-17 LAB — GLUCOSE, CAPILLARY
GLUCOSE-CAPILLARY: 105 mg/dL — AB (ref 65–99)
GLUCOSE-CAPILLARY: 158 mg/dL — AB (ref 65–99)
Glucose-Capillary: 169 mg/dL — ABNORMAL HIGH (ref 65–99)
Glucose-Capillary: 113 mg/dL — ABNORMAL HIGH (ref 65–99)

## 2017-05-17 LAB — CBC WITH DIFFERENTIAL/PLATELET
BASOS ABS: 0 10*3/uL (ref 0.0–0.1)
BLASTS: 0 %
Band Neutrophils: 0 %
Basophils Relative: 0 %
Eosinophils Absolute: 0.6 10*3/uL (ref 0.0–0.7)
Eosinophils Relative: 5 %
HEMATOCRIT: 35.2 % — AB (ref 36.0–46.0)
Hemoglobin: 10.4 g/dL — ABNORMAL LOW (ref 12.0–15.0)
Lymphocytes Relative: 12 %
Lymphs Abs: 1.4 10*3/uL (ref 0.7–4.0)
MCH: 29.6 pg (ref 26.0–34.0)
MCHC: 29.5 g/dL — ABNORMAL LOW (ref 30.0–36.0)
MCV: 100.3 fL — AB (ref 78.0–100.0)
METAMYELOCYTES PCT: 0 %
MYELOCYTES: 0 %
Monocytes Absolute: 0.7 10*3/uL (ref 0.1–1.0)
Monocytes Relative: 6 %
NEUTROS PCT: 77 %
NRBC: 0 /100{WBCs}
Neutro Abs: 8.6 10*3/uL — ABNORMAL HIGH (ref 1.7–7.7)
Other: 0 %
Platelets: 228 10*3/uL (ref 150–400)
Promyelocytes Relative: 0 %
RBC: 3.51 MIL/uL — AB (ref 3.87–5.11)
RDW: 16.6 % — ABNORMAL HIGH (ref 11.5–15.5)
WBC: 11.3 10*3/uL — AB (ref 4.0–10.5)

## 2017-05-17 LAB — MAGNESIUM: Magnesium: 1.7 mg/dL (ref 1.7–2.4)

## 2017-05-17 NOTE — Progress Notes (Signed)
Patient refused q2h repositioning all 12h shift

## 2017-05-17 NOTE — Progress Notes (Signed)
[facility] Progress Note  Patient: Megan Atkinson NWG:[id number]   PCP: Anne Ng, NP DOB: [birth year]   DOA: 05/12/2017   DOS: 05/17/2017   Date of Service: the patient was seen and examined on 05/17/2017  Subjective: No acute complaint.  No nausea no vomiting.  Tolerating oral diet.  Breathing is better as well.  Brief hospital course: Pt. with PMH of CAD, hypertension, diabetes mellitus type 2, CVA, MI status post CABG, diastolic CHF, hyperlipidemia; admitted on 05/12/2017, presented with complaint of confusion and fatigue, was found to have UTI with acute kidney injury as well as progression of chronic stroke. Currently further plan is continue treating hypernatremia.  Assessment and Plan: 1.  Acute metabolic encephalopathy. Multifactorial secondary to combination of AKI, hypernatremia, There is also a question of acute on chronic progression of the stroke versus recrudescence from metabolic derangements. Currently getting better patient appears to be back to baseline mentally.  Continue close monitoring.  2.  Sepsis secondary to E. coli UTI. Sepsis physiology resolved, urine culture E. coli Patient initially treated with Zosyn and vancomycin, subsequently switched to Rocephin.  Urine culture shows E. coli pansensitive, on Keflex to complete total of 7 days, last day 4/13 2019. Continue supportive treatment  Acute kidney failure hypernatremia Baseline creatinine 0.8 Creatinine on admission 4.39 Likely from infectious process, dehydration Creatinine continues to improve FENa consistent with prerenal azotemia Given half normal saline yesterday with improvement in sodium.  Encourage p.o. fluid.  Patchy left PICA acute ischemic infarct adjacent to previous known infarct with occlusion and left PICA. MRI shows area of acute infarction within the left cerebellum adjacent to areas of infarction from 2018.  No mass effect or hemorrhage. Echocardiogram was done LVEF 65-70%,  grade 1 diastolic dysfunction no wall motion abnormalities. Case discussed with neurology and recommended transfer to [facility] for stroke workup.   Appreciate neurology consultation.  No further workup at present from neurology recommended. On 325 mg aspirin and 75 mg Plavix in the past. Neurology recommends 81 mg aspirin and 75 mg Plavix daily for 3 weeks followed by Plavix alone. Also recommend to allow permissive hypertension.  Acute on chronic diastolic CHF Acute hypoxic respiratory failure,?  Atelectasis S/P IV Lasix x1.  Monitor. Use incentive spirometry. Wean O2.  Generalized weakness PT evaluated recommending SNF, patient was refusing  Hypertension/CAD status post CABG BP stable during hospital stay  holding blood pressure medication for now.  Type 2 diabetes mellitus, uncontrolled. Last A1c 8 4/7 CBGs stable Continue sliding scale  Hypernatremia. Sodium 150, likely from dehydration and poor p.o. intake.   147 after receiving half normal saline.  Encourage p.o. fluid.  Diet: Cardiac and carb modified diet DVT Prophylaxis: subcutaneous Heparin  Advance goals of care discussion: full code  Family Communication: no family was present at bedside, at the time of interview.  Disposition:  Discharge to SNF friday.  Consultants: neurolgy Procedures: Echocardiogram   Antibiotics: Anti-infectives (From admission, onward)   Start     Dose/Rate Route Frequency Ordered Stop   05/15/17 1200  cephALEXin (KEFLEX) capsule 500 mg     500 mg Oral Every 12 hours 05/15/17 1036 05/20/17 0959   05/15/17 0600  vancomycin (VANCOCIN) IVPB 1000 mg/200 mL premix  Status:  Discontinued     1,000 mg 200 mL/hr over 60 Minutes Intravenous Every 48 hours 05/13/17 0143 05/14/17 1034   05/14/17 1600  cefTRIAXone (ROCEPHIN) 2 g in sodium chloride 0.9 % 100 mL IVPB  Status:  Discontinued  2 g 200 mL/hr over 30 Minutes Intravenous Every 24 hours 05/14/17 1034 05/15/17 1036    05/14/17 0815  piperacillin-tazobactam (ZOSYN) IVPB 3.375 g  Status:  Discontinued     3.375 g 12.5 mL/hr over 240 Minutes Intravenous Every 8 hours 05/14/17 0805 05/14/17 1034   05/13/17 0800  piperacillin-tazobactam (ZOSYN) IVPB 2.25 g  Status:  Discontinued     2.25 g 100 mL/hr over 30 Minutes Intravenous Every 6 hours 05/13/17 0142 05/14/17 0805   05/13/17 0130  vancomycin (VANCOCIN) 2,500 mg in sodium chloride 0.9 % 500 mL IVPB     2,500 mg 250 mL/hr over 120 Minutes Intravenous  Once 05/13/17 0123 05/13/17 0410   05/13/17 0100  piperacillin-tazobactam (ZOSYN) IVPB 3.375 g     3.375 g 100 mL/hr over 30 Minutes Intravenous  Once 05/13/17 0050 05/13/17 0131   05/13/17 0100  vancomycin (VANCOCIN) IVPB 1000 mg/200 mL premix  Status:  Discontinued     1,000 mg 200 mL/hr over 60 Minutes Intravenous  Once 05/13/17 0050 05/13/17 0123       Objective: Physical Exam: Vitals:   05/17/17 0006 05/17/17 0406 05/17/17 0719 05/17/17 1219  BP: 123/81 (!) 137/58 (!) 119/49 (!) 149/60  Pulse: (!) 56 75 77 76  Resp: 18 20 18 18   Temp: 98 F (36.7 C) 98.9 F (37.2 C) 98.9 F (37.2 C) 99.7 F (37.6 C)  TempSrc: Oral Oral Oral Oral  SpO2: 100% 100% 97% 100%  Weight:      Height:        Intake/Output Summary (Last 24 hours) at 05/17/2017 1631 Last data filed at 05/17/2017 [id number] Gross per 24 hour  Intake 443.33 ml  Output 2800 ml  Net -2356.67 ml   Filed Weights   05/12/17 1929 05/13/17 0202 05/13/17 0343  Weight: (!) 162.4 kg (358 lb) (!) 168.7 kg (372 lb) (!) 168.7 kg (372 lb)   General: Alert, Awake and Oriented to Time, Place and Person. Appear in mild distress, affect appropriate Eyes: PERRL, Conjunctiva normal ENT: Oral Mucosa clear moist. Neck: difficult to assess JVD, no Abnormal Mass Or lumps Cardiovascular: S1 and S2 Present, no Murmur, Peripheral Pulses Present Respiratory: normal respiratory effort, Bilateral Air entry equal and Decreased, no use of accessory muscle, Clear to  Auscultation, no Crackles, no wheezes Abdomen: Bowel Sound present, Soft and no tenderness, no hernia Skin: no redness, no Rash, no induration Extremities: no Pedal edema, no calf tenderness Neurologic: Grossly no focal neuro deficit. Chronic right sided weakness Data Reviewed: CBC: Recent Labs  Lab 05/12/17 2023 05/13/17 0759 05/14/17 0459 05/15/17 0536 05/16/17 0421 05/17/17 0256  WBC 20.6* 17.4* 13.9* 11.9* 11.3* 11.3*  NEUTROABS 19.6*  --   --   --  8.9* 8.6*  HGB 13.5 11.3* 10.9* 10.6* 10.9* 10.4*  HCT 40.3 34.9* 34.7* 34.8* 37.0 35.2*  MCV 91.4 91.8 94.0 96.4 98.1 100.3*  PLT 225 208 228 243 249 228   Basic Metabolic Panel: Recent Labs  Lab 05/13/17 0214 05/13/17 0759  05/15/17 0536 05/16/17 0421 05/16/17 0847 05/16/17 1158 05/16/17 1803 05/16/17 2239 05/17/17 0256  NA 145 150*   < > 152* 152* 149* 150* 148* 147* 147*  K 4.3 4.3   < > 4.3 4.2 4.3 4.5 4.4 4.4 4.3  CL 110 114*   < > 120* 113* 114* 115* 114* 114* 114*  CO2 19* 23   < > 21* 26 25 25 27 26 27   GLUCOSE 214* 155*   < > 108* 108*  197* 102* 167* 158* 114*  BUN 217* 206*   < > 112* 77* 73* 71* 59* 57* 48*  CREATININE 3.94* 3.79*   < > 2.27* 1.94* 1.88* 1.97* 1.82* 1.73* 1.66*  CALCIUM 8.8* 8.9   < > 7.9* 8.2* 8.0* 8.2* 8.2* 8.1* 8.1*  MG 2.9* 2.8*  --  2.4 2.0  --   --   --   --  1.7  PHOS 8.7* 8.5*  --   --   --   --   --   --   --   --    < > = values in this interval not displayed.    Liver Function Tests: Recent Labs  Lab 05/12/17 2023 05/13/17 0759 05/15/17 0536 05/16/17 0421 05/17/17 0256  AST 32 25 19 22  33  ALT 36 28 26 27  36  ALKPHOS 107 85 71 72 69  BILITOT 0.7 0.9 0.4 0.4 0.4  PROT 7.8 6.3* 6.1* 6.2* 5.9*  ALBUMIN 3.1* 2.5* 2.3* 2.2* 2.2*   Recent Labs  Lab 05/16/17 0421  LIPASE 43   No results for input(s): AMMONIA in the last 168 hours. Coagulation Profile: No results for input(s): INR, PROTIME in the last 168 hours. Cardiac Enzymes: Recent Labs  Lab 05/12/17 2023  05/13/17 0214 05/13/17 0759 05/13/17 1435 05/15/17 0536  CKTOTAL 505*  --  331*  --  120  TROPONINI 0.05* 0.03* 0.04* 0.03*  --    BNP (last 3 results) No results for input(s): PROBNP in the last 8760 hours. CBG: Recent Labs  Lab 05/16/17 0744 05/16/17 1140 05/16/17 2115 05/17/17 0616 05/17/17 1130  GLUCAP 181* 101* 188* 105* 169*   Studies: No results found.  Scheduled Meds: . aspirin EC  81 mg Oral Daily  . atorvastatin  80 mg Oral q1800  . cephALEXin  500 mg Oral Q12H  . chlorhexidine  15 mL Mouth Rinse BID  . clopidogrel  75 mg Oral Daily  . heparin injection (subcutaneous)  5,000 Units Subcutaneous Q8H  . insulin aspart  0-5 Units Subcutaneous QHS  . insulin aspart  0-9 Units Subcutaneous TID WC  . mouth rinse  15 mL Mouth Rinse q12n4p  . protein supplement shake  11 oz Oral BID BM   Continuous Infusions:  PRN Meds: acetaminophen **OR** acetaminophen, HYDROcodone-acetaminophen  Time spent: 35 minutes  Author: Lynden Oxford, MD [facility] Pager: [phone] 05/17/2017 4:31 PM  If 7PM-7AM, please contact night-coverage at [email or web address], password [facility]

## 2017-05-17 NOTE — Plan of Care (Signed)
  Problem: Education: Goal: Knowledge of General Education information will improve Outcome: Progressing Note:  POC reviewed with pt.   

## 2017-05-17 NOTE — Progress Notes (Signed)
Physical Therapy Treatment Patient Details Name: Megan NielsenBeverley Atkinson MRN: [id number] DOB: 08-09-1950 Today's Date: 05/17/2017    History of Present Illness 67 yo female admitted with AKI, extension of cerebellar infarct. Hx of morbid obesity, CAD, cerebellar CVA January 2018, CHF, DM, vertigo.     PT Comments    Patient demonstrating mild improvements with bed mobility toady, now max A x2 for supine<>sit. Tolerating EOB sitting for >10 minutes. Performed therex bedside unable to attempt standing today due to L knee pain. Pt eager to work with therapy,  SNF recs still appropriate.     Follow Up Recommendations  SNF     Equipment Recommendations  None recommended by PT    Recommendations for Other Services       Precautions / Restrictions Precautions Precautions: Fall Precaution Comments: incontinent Restrictions Weight Bearing Restrictions: No    Mobility  Bed Mobility Overal bed mobility: Needs Assistance Bed Mobility: Sidelying to Sit;Supine to Sit     Supine to sit: +2 for physical assistance;+2 for safety/equipment;HOB elevated;Total assist Sit to supine: Max assist;+2 for safety/equipment;+2 for physical assistance   General bed mobility comments: max A x2 today for supine<>sit, assisting with BLE and powering up trunk. patient with increased effort from prior PT visit  Transfers Overall transfer level: Needs assistance               General transfer comment: pt refused to attempt standing to to L Knee pain.   Ambulation/Gait                 Stairs            Wheelchair Mobility    Modified Rankin (Stroke Patients Only)       Balance Overall balance assessment: Needs assistance Sitting-balance support: Feet supported;Single extremity supported Sitting balance-Leahy Scale: Fair     Standing balance support: Bilateral upper extremity supported Standing balance-Leahy Scale: Poor                              Cognition  Arousal/Alertness: Lethargic Behavior During Therapy: WFL for tasks assessed/performed Overall Cognitive Status: Within Functional Limits for tasks assessed                                        Exercises General Exercises - Lower Extremity Ankle Circles/Pumps: AROM;Both;20 reps;Supine Long Arc Quad: Seated;AROM;10 reps Hip ABduction/ADduction: AROM;10 reps Shoulder Exercises Shoulder Flexion: AAROM;Both;5 reps;Supine Other Exercises Other Exercises: shoulder rolls in sitting AROM both x 10    General Comments        Pertinent Vitals/Pain Pain Assessment: Faces Faces Pain Scale: Hurts little more Pain Location: L knee Pain Descriptors / Indicators: Discomfort;Grimacing Pain Intervention(s): Limited activity within patient's tolerance;Monitored during session;Premedicated before session;Repositioned    Home Living                      Prior Function            PT Goals (current goals can now be found in the care plan section) Acute Rehab PT Goals Patient Stated Goal: to be able to walk again PT Goal Formulation: With patient Time For Goal Achievement: 05/27/17 Potential to Achieve Goals: Good Progress towards PT goals: Progressing toward goals    Frequency    Min 3X/week      PT Plan Current plan  remains appropriate    Co-evaluation              AM-PAC PT "6 Clicks" Daily Activity  Outcome Measure  Difficulty turning over in bed (including adjusting bedclothes, sheets and blankets)?: Unable Difficulty moving from lying on back to sitting on the side of the bed? : Unable Difficulty sitting down on and standing up from a chair with arms (e.g., wheelchair, bedside commode, etc,.)?: Unable Help needed moving to and from a bed to chair (including a wheelchair)?: Total Help needed walking in hospital room?: Total Help needed climbing 3-5 steps with a railing? : Total 6 Click Score: 6    End of Session Equipment Utilized During  Treatment: Gait belt Activity Tolerance: Patient limited by fatigue Patient left: in bed;with call bell/phone within reach Nurse Communication: Mobility status;Other (comment) PT Visit Diagnosis: Muscle weakness (generalized) (M62.81);Difficulty in walking, not elsewhere classified (R26.2);Other abnormalities of gait and mobility (R26.89)     Time: 1610-9604 PT Time Calculation (min) (ACUTE ONLY): 18 min  Charges:  $Therapeutic Activity: 8-22 mins                    G Codes:      Etta Grandchild, PT, DPT Acute Rehab Services Pager: [phone]     Etta Grandchild 05/17/2017, 11:27 AM

## 2017-05-17 NOTE — Progress Notes (Signed)
  Patient refused to get turned all 12 hours, education provided on the side effects of not turning and pressure/moisture associated wounds. At end of shift, pt had a BM and agreed to get turned and cleaned. Old  Stage 2 pressure ulcer is worse. Pt cleaned, foam dressing applied, pt repositioned on her side.

## 2017-05-18 DIAGNOSIS — M6281 Muscle weakness (generalized): Secondary | ICD-10-CM | POA: Diagnosis not present

## 2017-05-18 DIAGNOSIS — I636 Cerebral infarction due to cerebral venous thrombosis, nonpyogenic: Secondary | ICD-10-CM | POA: Diagnosis not present

## 2017-05-18 DIAGNOSIS — N179 Acute kidney failure, unspecified: Secondary | ICD-10-CM | POA: Diagnosis not present

## 2017-05-18 DIAGNOSIS — G4733 Obstructive sleep apnea (adult) (pediatric): Secondary | ICD-10-CM | POA: Diagnosis not present

## 2017-05-18 DIAGNOSIS — R6510 Systemic inflammatory response syndrome (SIRS) of non-infectious origin without acute organ dysfunction: Secondary | ICD-10-CM | POA: Diagnosis not present

## 2017-05-18 DIAGNOSIS — I6330 Cerebral infarction due to thrombosis of unspecified cerebral artery: Secondary | ICD-10-CM | POA: Diagnosis not present

## 2017-05-18 DIAGNOSIS — R269 Unspecified abnormalities of gait and mobility: Secondary | ICD-10-CM | POA: Diagnosis not present

## 2017-05-18 DIAGNOSIS — D649 Anemia, unspecified: Secondary | ICD-10-CM | POA: Diagnosis not present

## 2017-05-18 DIAGNOSIS — N289 Disorder of kidney and ureter, unspecified: Secondary | ICD-10-CM | POA: Diagnosis not present

## 2017-05-18 DIAGNOSIS — G9341 Metabolic encephalopathy: Secondary | ICD-10-CM | POA: Diagnosis not present

## 2017-05-18 DIAGNOSIS — I10 Essential (primary) hypertension: Secondary | ICD-10-CM | POA: Diagnosis not present

## 2017-05-18 DIAGNOSIS — N390 Urinary tract infection, site not specified: Secondary | ICD-10-CM | POA: Diagnosis not present

## 2017-05-18 DIAGNOSIS — E119 Type 2 diabetes mellitus without complications: Secondary | ICD-10-CM | POA: Diagnosis not present

## 2017-05-18 DIAGNOSIS — I2510 Atherosclerotic heart disease of native coronary artery without angina pectoris: Secondary | ICD-10-CM | POA: Diagnosis not present

## 2017-05-18 DIAGNOSIS — G464 Cerebellar stroke syndrome: Secondary | ICD-10-CM | POA: Diagnosis not present

## 2017-05-18 DIAGNOSIS — R262 Difficulty in walking, not elsewhere classified: Secondary | ICD-10-CM | POA: Diagnosis not present

## 2017-05-18 DIAGNOSIS — E785 Hyperlipidemia, unspecified: Secondary | ICD-10-CM | POA: Diagnosis not present

## 2017-05-18 DIAGNOSIS — E870 Hyperosmolality and hypernatremia: Secondary | ICD-10-CM | POA: Diagnosis not present

## 2017-05-18 LAB — BASIC METABOLIC PANEL
Anion gap: 9 (ref 5–15)
BUN: 38 mg/dL — AB (ref 6–20)
CALCIUM: 8.3 mg/dL — AB (ref 8.9–10.3)
CO2: 27 mmol/L (ref 22–32)
Chloride: 110 mmol/L (ref 101–111)
Creatinine, Ser: 1.54 mg/dL — ABNORMAL HIGH (ref 0.44–1.00)
GFR calc Af Amer: 39 mL/min — ABNORMAL LOW (ref >60)
GFR, EST NON AFRICAN AMERICAN: 34 mL/min — AB (ref >60)
GLUCOSE: 110 mg/dL — AB (ref 65–99)
Potassium: 4.3 mmol/L (ref 3.5–5.1)
SODIUM: 146 mmol/L — AB (ref 135–145)

## 2017-05-18 LAB — CULTURE, BLOOD (ROUTINE X 2)
CULTURE: NO GROWTH
CULTURE: NO GROWTH
SPECIAL REQUESTS: ADEQUATE
Special Requests: ADEQUATE

## 2017-05-18 LAB — GLUCOSE, CAPILLARY
GLUCOSE-CAPILLARY: 166 mg/dL — AB (ref 65–99)
Glucose-Capillary: 109 mg/dL — ABNORMAL HIGH (ref 65–99)
Glucose-Capillary: 140 mg/dL — ABNORMAL HIGH (ref 65–99)

## 2017-05-18 LAB — CREATININE, SERUM
Creatinine, Ser: 1.57 mg/dL — ABNORMAL HIGH (ref 0.44–1.00)
GFR, EST AFRICAN AMERICAN: 38 mL/min — AB (ref >60)
GFR, EST NON AFRICAN AMERICAN: 33 mL/min — AB (ref >60)

## 2017-05-18 MED ORDER — PREMIER PROTEIN SHAKE: 11.0000 [oz_av] | Freq: Two times a day (BID) | ORAL | 0 refills | Status: DC

## 2017-05-18 MED ORDER — ASPIRIN 81 MG PO TBEC: 81.0000 mg | DELAYED_RELEASE_TABLET | Freq: Every day | ORAL | 0 refills | Status: AC

## 2017-05-18 MED ORDER — CEPHALEXIN 500 MG PO CAPS: 500.0000 mg | ORAL_CAPSULE | Freq: Two times a day (BID) | ORAL | 0 refills | Status: AC

## 2017-05-18 NOTE — Progress Notes (Signed)
[phone].

## 2017-05-18 NOTE — Discharge Summary (Signed)
[facility] Discharge Summary   Patient: Megan Atkinson ZOX:[id number]   PCP: Anne Ng, NP DOB: [birth year]   Date of admission: 05/12/2017   Date of discharge:  05/18/2017    Discharge Diagnoses:  Active Problems:   CAD (coronary artery disease)   Hypertension   Diabetes mellitus (HCC)   Hyperlipidemia   OSA (obstructive sleep apnea)   Morbid obesity (HCC)   Gait disturbance, post-stroke   Benign essential HTN   Acute kidney injury (HCC)   Elevated troponin   Dehydration   Hypernatremia   Elevated CK   Prolonged QT interval   Leukocytosis   Acute renal failure (ARF) (HCC)   SIRS (systemic inflammatory response syndrome) (HCC)   Acute lower UTI   Cerebral thrombosis with cerebral infarction   Admitted From: home Disposition:  SNF  Recommendations for Outpatient Follow-up:  1. Please follow up with PCP and neurology as recommended    Contact information for follow-up providers    [facility] Follow up in 4 week(s).   Specialty:  Radiology Why:  stroke clinic. office will call wtih follow up appt date and time Contact information: [address] [place] [address] [phone]       Nche, Bonna Gains, NP. Schedule an appointment as soon as possible for a visit in 1 week(s).   Specialty:  Internal Medicine Contact information: [address] [place] [address] [phone]            Contact information for after-discharge care    Destination    [facility] .   Service:  Skilled Nursing Contact information: [address] [place] [address] [phone]                 Diet recommendation: cardiac diet  Activity: The patient is advised to gradually reintroduce usual activities.  Discharge Condition: good  Code Status: full code  History of present illness: As per the H and P dictated on admission, "Megan Atkinson is a 67 y.o. female  with medical history significant of HTN, morbid obesity, CAD, cerebellar CVA 2018, diastolic CHF, DM 2,     Presented with 4-day history of progressive weakness unable to ambulate patient has been using a walker at baseline but was found sitting in the wheelchair downstairs at her baseline she is usually able to walk up the stairs but for some reason was unable to. Patient reports she felt unwell and then developed significant fatigue all over.  Patient lives at home by herself   She did endorse some exertional shortness of breath have not eaten anything for the past 4 days, urinated into her brief.  She was too weak to get up or to push her wheelchair and was afraid to fall on the floor. Until today when her friend found her and gave her fluids and a shake  today.  No headache or chest pain no abdominal pain no urinary complaints no back pain. No fever."  Hospital Course:  Summary of her active problems in the hospital is as following. 1.  Acute metabolic encephalopathy. Multifactorial secondary to combination of AKI, hypernatremia, There is also a question of acute on chronic progression of the stroke versus recrudescence from metabolic derangements. Currently getting better patient appears to be back to baseline mentally.  Continue close monitoring.  2.  Sepsis secondary to E. coli UTI. Sepsis physiology resolved, urine culture E. coli Patient initially treated with Zosyn and vancomycin, subsequently switched  to Rocephin. Urine culture shows E. coli pansensitive, on Keflex to complete total of 7 days, last day 05/20/2017. Continue supportive treatment  Acute kidney failure hypernatremia Baseline creatinine 0.8 Creatinine on admission 4.39, on discharge 1.54.  BUN also getting better. Likely from infectious process, dehydration FENa consistent with prerenal azotemia Encourage p.o. fluid.  Patchy left PICA acute ischemic infarct adjacent to previous known infarct with occlusion and  left PICA. MRI shows area of acute infarction within the left cerebellum adjacent to areas of infarction from 2018. No mass effect or hemorrhage. Echocardiogram was done LVEF 65-70%, grade 1 diastolic dysfunction no wall motion abnormalities. Case discussed with neurology and recommended transfer to [facility] for stroke workup.  Appreciate neurology consultation.  No further workup at present from neurology recommended. On 325 mg aspirin and 75 mg Plavix in the past. Neurology recommends 81 mg aspirin and 75 mg Plavix daily for 3 weeks followed by Plavix alone. Also recommend to allow permissive hypertension.  Acute on chronic diastolic CHF Acute hypoxic respiratory failure,?  Atelectasis S/P IV Lasix x1.  Monitor. Use incentive spirometry. Wean O2.  Generalized weakness PT evaluated recommending SNF.  Hypertension/CAD status post CABG BP stable during hospital stay holding blood pressure medication for now, the blood pressure is stable without them.  Type 2 diabetes mellitus, uncontrolled. Last A1c 8 4/7 CBGs stable  Hypernatremia. Sodium 150, likely from dehydration and poor p.o. intake.   146 today.  Encourage p.o. fluid.  All other chronic medical condition were stable during the hospitalization.  Patient was seen by physical therapy, who recommended SNF, which was arranged by Child psychotherapist and case Production designer, theatre/television/film. On the day of the discharge the patient's vitals were stable , and no other acute medical condition were reported by patient. the patient was felt safe to be discharge at SNF with therapy.  Procedures and Results:  echo   Consultations:  Neurology  DISCHARGE MEDICATION: Allergies as of 05/18/2017      Reactions   Lisinopril    cough   Metformin And Related Diarrhea   A lot of fluid loss from bowel and diarrhea      Medication List    STOP taking these medications   amLODipine 2.5 MG tablet Commonly known as:  NORVASC   aspirin 325 MG  tablet Replaced by:  aspirin 81 MG EC tablet   metoprolol tartrate 25 MG tablet Commonly known as:  LOPRESSOR     TAKE these medications   aspirin 81 MG EC tablet Take 1 tablet (81 mg total) by mouth daily. Start taking on:  05/19/2017 Replaces:  aspirin 325 MG tablet   atorvastatin 80 MG tablet Commonly known as:  LIPITOR TAKE 1 TABLET BY MOUTH ONCE DAILY AT  6  PM   cephALEXin 500 MG capsule Commonly known as:  KEFLEX Take 1 capsule (500 mg total) by mouth every 12 (twelve) hours for 2 days.   clopidogrel 75 MG tablet Commonly known as:  PLAVIX Take 1 tablet (75 mg total) by mouth daily.   fluticasone 50 MCG/ACT nasal spray Commonly known as:  FLONASE Place 1 spray into both nostrils daily. What changed:    when to take this  reasons to take this   protein supplement shake Liqd Commonly known as:  PREMIER PROTEIN Take 325 mLs (11 oz total) by mouth 2 (two) times daily between meals.   pyridOXINE 100 MG tablet Commonly known as:  VITAMIN B-6 Take 100 mg by mouth daily.   senna-docusate  8.6-50 MG tablet Commonly known as:  Senokot-S Take 2 tablets by mouth 2 (two) times daily. What changed:    how much to take  when to take this  reasons to take this      Allergies  Allergen Reactions  . Lisinopril     cough  . Metformin And Related Diarrhea    A lot of fluid loss from bowel and diarrhea   Discharge Instructions    Ambulatory referral to Neurology   Complete by:  As directed    Follow up with stroke clinic NP (Jessica Vanschaick or Darrol Angel, if both not available, consider Dr. Delia Heady, Dr. Jamelle Rushing, or Dr. Naomie Dean) at [facility] in about 4 weeks.   Diet - low sodium heart healthy   Complete by:  As directed    Discharge instructions   Complete by:  As directed    It is important that you read following instructions as well as go over your medication list with RN to help you understand your care after this  hospitalization.  Discharge Instructions: Please follow-up with PCP in one week  Please request your primary care physician to go over all Hospital Tests and Procedure/Radiological results at the follow up,  Please get all Hospital records sent to your PCP by signing hospital release before you go home.   Do not take more than prescribed Pain, Sleep and Anxiety Medications. You were cared for by a hospitalist during your hospital stay. If you have any questions about your discharge medications or the care you received while you were in the hospital after you are discharged, you can call the unit and ask to speak with the hospitalist on call if the hospitalist that took care of you is not available.  Once you are discharged, your primary care physician will handle any further medical issues. Please note that NO REFILLS for any discharge medications will be authorized once you are discharged, as it is imperative that you return to your primary care physician (or establish a relationship with a primary care physician if you do not have one) for your aftercare needs so that they can reassess your need for medications and monitor your lab values. You Must read complete instructions/literature along with all the possible adverse reactions/side effects for all the Medicines you take and that have been prescribed to you. Take any new Medicines after you have completely understood and accept all the possible adverse reactions/side effects. Wear Seat belts while driving. If you have smoked or chewed Tobacco in the last 2 yrs please stop smoking and/or stop any Recreational drug use.   Increase activity slowly   Complete by:  As directed      Discharge Exam: Filed Weights   05/12/17 1929 05/13/17 0202 05/13/17 0343  Weight: (!) 162.4 kg (358 lb) (!) 168.7 kg (372 lb) (!) 168.7 kg (372 lb)   Vitals:   05/18/17 0749 05/18/17 1147  BP: (!) 166/61 (!) 147/77  Pulse: 85 81  Resp: 18 18  Temp: 97.7 F  (36.5 C) 97.7 F (36.5 C)  SpO2: 98% 98%   General: Appear in no distress, no Rash; Oral Mucosa moist. Cardiovascular: S1 and S2 Present, no Murmur, no JVD Respiratory: Bilateral Air entry present and basal Crackles, no wheezes Abdomen: Bowel Sound present, Soft and no tenderness Extremities: no Pedal edema, no calf tenderness Neurology: Grossly no focal neuro deficit.  The results of significant diagnostics from this hospitalization (including imaging, microbiology, ancillary and  laboratory) are listed below for reference.    Significant Diagnostic Studies: Dg Chest 2 View  Result Date: 05/12/2017 CLINICAL DATA:  Weakness and fatigue for 4 days. History of stroke, hypertension, diabetes, coronary artery disease. Nonsmoker. EXAM: CHEST - 2 VIEW COMPARISON:  02/06/2011 FINDINGS: Postoperative changes in the mediastinum. Mild cardiac enlargement. No vascular congestion or edema. Shallow inspiration with elevation of the right hemidiaphragm and linear atelectasis in the right lung base. No blunting of costophrenic angles. No pneumothorax. Mediastinal contours appear intact. Calcification of the aorta. IMPRESSION: Cardiac enlargement. Elevated right hemidiaphragm with atelectasis in the right lung base. No focal consolidation. Aortic atherosclerosis. Electronically Signed   By: Burman NievesWilliam  Stevens M.D.   On: 05/12/2017 21:03   Ct Head Wo Contrast  Result Date: 05/12/2017 CLINICAL DATA:  Diffuse weakness. Unable to stand on legs. Progressing for over 1 week. History of coronary disease, diabetes, and stroke. EXAM: CT HEAD WITHOUT CONTRAST TECHNIQUE: Contiguous axial images were obtained from the base of the skull through the vertex without intravenous contrast. COMPARISON:  MRI brain 03/07/2016. CT angio head 03/07/2016. CT head 03/06/2016 FINDINGS: Brain: Heterogeneous low-attenuation changes in the left cerebellum, similar to prior study, and consistent with known left posterior inferior cerebral artery  infarct. There is possibly some progression in the area of involvement although this could be normal evolutionary change. Consider MR for further evaluation if clinically indicated. No mass effect or midline shift. No abnormal extra-axial fluid collections. Gray-white matter junctions are distinct. Basal cisterns are not effaced. No ventricular dilatation. No acute intracranial hemorrhage. Vascular: Intracranial arterial vascular calcifications are present. Skull: Calvarium appears intact. No acute depressed skull fractures. Sinuses/Orbits: Mucosal thickening in the ethmoid air cells. No acute air-fluid levels. Mastoid air cells are clear. Other: None. IMPRESSION: Old infarct in the left cerebellum. Possible progression of the area of involvement although this could represent normal evolutionary change. MRI correlation may be useful if clinically indicated. No acute intracranial hemorrhage and no significant mass effect. Vascular calcifications. Electronically Signed   By: Burman NievesWilliam  Stevens M.D.   On: 05/12/2017 23:59   Mr Brain Wo Contrast  Result Date: 05/15/2017 CLINICAL DATA:  Acute presentation with weakness and altered mental status. EXAM: MRI HEAD WITHOUT CONTRAST TECHNIQUE: Multiplanar, multiecho pulse sequences of the brain and surrounding structures were obtained without intravenous contrast. COMPARISON:  CT 05/12/2017.  MRI 03/07/2016. FINDINGS: Brain: There is been extension of acute infarction in the left cerebellum, affecting cerebellar tissue adjacent to the areas of previously seen infarction. No supratentorial acute infarction. There chronic small-vessel ischemic changes of the hemispheric white matter. Study is abbreviated and motion degraded. No evidence of mass lesion, hydrocephalus or extra-axial collection. Vascular: No vascular information available. Skull and upper cervical spine: No abnormality seen. Sinuses/Orbits: Negative as seen. Other: None IMPRESSION: Abbreviated and motion degraded  exam. Areas of acute infarction within the left cerebellum, adjacent to the areas of infarction that were acute in January of 2018. No apparent mass effect or hemorrhage. Electronically Signed   By: Paulina FusiMark  Shogry M.D.   On: 05/15/2017 08:36   Dg Chest Port 1 View  Result Date: 05/15/2017 CLINICAL DATA:  Hypoxia. History of coronary artery disease and CABG, diabetes, obesity. EXAM: PORTABLE CHEST 1 VIEW COMPARISON:  PA and lateral chest x-ray of May 12, 2017 FINDINGS: There is chronic elevation of the right hemidiaphragm. The left lung is well-expanded. The cardiac silhouette is enlarged. The pulmonary vascularity is mildly engorged and indistinct. The sternal wires are intact. There is no  definite pleural effusion. IMPRESSION: Marked elevation of the right hemidiaphragm resulting in loss of approximately 1/2 of the right pleural space volume. Cardiomegaly with mild pulmonary vascular congestion suggests low-grade CHF slightly more conspicuous than on the previous study. No acute pneumonia. Electronically Signed   By: David  Swaziland M.D.   On: 05/15/2017 11:26    Microbiology: Recent Results (from the past 240 hour(s))  Urine Culture     Status: Abnormal   Collection Time: 05/13/17  1:14 AM  Result Value Ref Range Status   Specimen Description   Final    URINE, CLEAN CATCH Performed at [facility], [address] [place], [place] [address]    Special Requests   Final    NONE Performed at [facility], [address] [place], [place] [address]    Culture >=100,000 COLONIES/mL ESCHERICHIA COLI (A)  Final   Report Status 05/15/2017 FINAL  Final   Organism ID, Bacteria ESCHERICHIA COLI (A)  Final      Susceptibility   Escherichia coli - MIC*    AMPICILLIN <=2 SENSITIVE Sensitive     CEFAZOLIN <=4 SENSITIVE Sensitive     CEFTRIAXONE <=1 SENSITIVE Sensitive     CIPROFLOXACIN <=0.25 SENSITIVE Sensitive     GENTAMICIN >=16 RESISTANT Resistant     IMIPENEM  <=0.25 SENSITIVE Sensitive     NITROFURANTOIN <=16 SENSITIVE Sensitive     TRIMETH/SULFA <=20 SENSITIVE Sensitive     AMPICILLIN/SULBACTAM <=2 SENSITIVE Sensitive     PIP/TAZO <=4 SENSITIVE Sensitive     Extended ESBL NEGATIVE Sensitive     * >=100,000 COLONIES/mL ESCHERICHIA COLI  MRSA PCR Screening     Status: None   Collection Time: 05/13/17  2:04 AM  Result Value Ref Range Status   MRSA by PCR NEGATIVE NEGATIVE Final    Comment:        The GeneXpert MRSA Assay (FDA approved for NASAL specimens only), is one component of a comprehensive MRSA colonization surveillance program. It is not intended to diagnose MRSA infection nor to guide or monitor treatment for MRSA infections. Performed at [facility], [address] [place], [place] [address]   Culture, blood (x 2)     Status: None   Collection Time: 05/13/17  2:14 AM  Result Value Ref Range Status   Specimen Description   Final    BLOOD RIGHT ANTECUBITAL Performed at [facility], [address] [place], [place] [address]    Special Requests   Final    BOTTLES DRAWN AEROBIC AND ANAEROBIC Blood Culture adequate volume Performed at [facility], [address] [place], [place] [address]    Culture   Final    NO GROWTH 5 DAYS Performed at [facility], [address], [place], [place] [address]    Report Status 05/18/2017 FINAL  Final  Culture, blood (x 2)     Status: None   Collection Time: 05/13/17  2:14 AM  Result Value Ref Range Status   Specimen Description   Final    BLOOD RIGHT HAND Performed at [facility], [address] [place], [place] [address]    Special Requests   Final    BOTTLES DRAWN AEROBIC ONLY Blood Culture adequate volume Performed at [facility], [address] [place], [place] [address]    Culture   Final    NO GROWTH 5 DAYS Performed at [facility], [address]  [place], [place]  [address]    Report Status 05/18/2017 FINAL  Final     Labs: CBC: Recent Labs  Lab 05/12/17 2023 05/13/17 0759 05/14/17 0459 05/15/17 0536 05/16/17 0421 05/17/17 0256  WBC 20.6* 17.4* 13.9* 11.9* 11.3* 11.3*  NEUTROABS 19.6*  --   --   --  8.9* 8.6*  HGB 13.5 11.3* 10.9* 10.6* 10.9* 10.4*  HCT 40.3 34.9* 34.7* 34.8* 37.0 35.2*  MCV 91.4 91.8 94.0 96.4 98.1 100.3*  PLT 225 208 228 243 249 228   Basic Metabolic Panel: Recent Labs  Lab 05/13/17 0214 05/13/17 0759  05/15/17 0536 05/16/17 0421  05/16/17 1158 05/16/17 1803 05/16/17 2239 05/17/17 0256 05/18/17 0423  NA 145 150*   < > 152* 152*   < > 150* 148* 147* 147* 146*  K 4.3 4.3   < > 4.3 4.2   < > 4.5 4.4 4.4 4.3 4.3  CL 110 114*   < > 120* 113*   < > 115* 114* 114* 114* 110  CO2 19* 23   < > 21* 26   < > 25 27 26 27 27   GLUCOSE 214* 155*   < > 108* 108*   < > 102* 167* 158* 114* 110*  BUN 217* 206*   < > 112* 77*   < > 71* 59* 57* 48* 38*  CREATININE 3.94* 3.79*   < > 2.27* 1.94*   < > 1.97* 1.82* 1.73* 1.66* 1.54*  1.57*  CALCIUM 8.8* 8.9   < > 7.9* 8.2*   < > 8.2* 8.2* 8.1* 8.1* 8.3*  MG 2.9* 2.8*  --  2.4 2.0  --   --   --   --  1.7  --   PHOS 8.7* 8.5*  --   --   --   --   --   --   --   --   --    < > = values in this interval not displayed.   Liver Function Tests: Recent Labs  Lab 05/12/17 2023 05/13/17 0759 05/15/17 0536 05/16/17 0421 05/17/17 0256  AST 32 25 19 22  33  ALT 36 28 26 27  36  ALKPHOS 107 85 71 72 69  BILITOT 0.7 0.9 0.4 0.4 0.4  PROT 7.8 6.3* 6.1* 6.2* 5.9*  ALBUMIN 3.1* 2.5* 2.3* 2.2* 2.2*   Recent Labs  Lab 05/16/17 0421  LIPASE 43   No results for input(s): AMMONIA in the last 168 hours. Cardiac Enzymes: Recent Labs  Lab 05/12/17 2023 05/13/17 0214 05/13/17 0759 05/13/17 1435 05/15/17 0536  CKTOTAL 505*  --  331*  --  120  TROPONINI 0.05* 0.03* 0.04* 0.03*  --    BNP (last 3 results) Recent Labs    05/12/17 2023  BNP 59.1   CBG: Recent Labs  Lab 05/17/17 1130  05/17/17 1648 05/17/17 2155 05/18/17 0656 05/18/17 1146  GLUCAP 169* 113* 158* 109* 166*   Time spent: 35 minutes  Signed:  Lynden Oxford  [facility]  05/18/2017  , 3:27 PM

## 2017-05-18 NOTE — Progress Notes (Signed)
Occupational Therapy Treatment Patient Details Name: Megan NielsenBeverley Atkinson MRN: [id number] DOB: 04-08-1950 Today's Date: 05/18/2017    History of present illness 67 yo female admitted with AKI, extension of cerebellar infarct. Hx of morbid obesity, CAD, cerebellar CVA January 2018, CHF, DM, vertigo.    OT comments  Pt presents supine in bed, agreeable to working with OT. Focus of session on bed mobility for completion of peri-care after incontinent BM. Pt requiring max-total assist+2 (at times+3) for bed mobility and to maintain sidelying position for peri-care, with total assist provided for peri-care. Educated pt regarding importance of continued mobility as well as pressure relief. Feel SNF recommendation remains appropriate at this time. Will continue to follow acutely to progress pt towards established OT goals.   Follow Up Recommendations  SNF    Equipment Recommendations  (TBD in next venue )          Precautions / Restrictions Precautions Precautions: Fall Precaution Comments: incontinent Restrictions Weight Bearing Restrictions: No       Mobility Bed Mobility Overal bed mobility: Needs Assistance Bed Mobility: Rolling Rolling: Total assist;+2 for physical assistance;Max assist         General bed mobility comments: Max-totalA +2/3 to roll to L/R with verbal cues for use of UEs to assist                                                                   ADL either performed or assessed with clinical judgement   ADL Overall ADL's : Needs assistance/impaired                             Toileting- Clothing Manipulation and Hygiene: Total assistance;Bed level Toileting - Clothing Manipulation Details (indicate cue type and reason): pt requiring +3 assist for bed mobility to roll to L/R for pericare and to apply new dressings to wounds (assist from RN and NT)     Functional mobility during ADLs: +2 for physical assistance;+2 for  safety/equipment;Total assistance(for bed mobility only ) General ADL Comments: pt with totalassist (+3) for bed mobility for pericare and to change linens; increased pain with mobility; educated on need for positioning in sidelying for pressure relief and importance of moving                        Cognition Arousal/Alertness: Awake/alert Behavior During Therapy: WFL for tasks assessed/performed Overall Cognitive Status: Within Functional Limits for tasks assessed                                                            Pertinent Vitals/ Pain       Pain Assessment: Faces Faces Pain Scale: Hurts whole lot Pain Location: generalized, with mobility  Pain Descriptors / Indicators: Discomfort;Grimacing;Crying Pain Intervention(s): Limited activity within patient's tolerance;Monitored during session  Frequency  Min 2X/week        Progress Toward Goals  OT Goals(current goals can now be found in the care plan section)  Progress towards OT goals: Progressing toward goals  Acute Rehab OT Goals Patient Stated Goal: to be able to walk again OT Goal Formulation: With patient Time For Goal Achievement: 05/28/17 Potential to Achieve Goals: Fair  Plan Discharge plan remains appropriate                   AM-PAC PT "6 Clicks" Daily Activity     Outcome Measure   Help from another person eating meals?: A Little Help from another person taking care of personal grooming?: A Little Help from another person toileting, which includes using toliet, bedpan, or urinal?: Total Help from another person bathing (including washing, rinsing, drying)?: Total Help from another person to put on and taking off regular upper body clothing?: A Lot Help from another person to put on and taking off regular lower body clothing?: Total 6 Click Score: 11    End of Session    OT  Visit Diagnosis: Muscle weakness (generalized) (M62.81);History of falling (Z91.81);Unsteadiness on feet (R26.81)   Activity Tolerance Patient tolerated treatment well;Patient limited by pain   Patient Left in bed;with call bell/phone within reach;with bed alarm set   Nurse Communication Mobility status        Time: 1610-9604 OT Time Calculation (min): 23 min  Charges: OT General Charges $OT Visit: 1 Visit OT Treatments $Self Care/Home Management : 8-22 mins  Marcy Siren, OT Pager [phone] 05/18/2017    Orlando Penner 05/18/2017, 3:35 PM

## 2017-05-18 NOTE — Progress Notes (Signed)
Patient transferred to [facility] via [facility].  Pt had no complaints and vitals were stable upon transport. AVS forms and prescriptions sent with patient.

## 2017-05-22 ENCOUNTER — Telehealth: Payer: Self-pay | Admitting: Behavioral Health

## 2017-05-22 NOTE — Telephone Encounter (Signed)
RN confirmed with Karena AddisonIrene at [facility] (skilled nursing facility) that the patient has been admitted to their facility post hospital discharge.

## 2017-05-27 DIAGNOSIS — I10 Essential (primary) hypertension: Secondary | ICD-10-CM | POA: Diagnosis not present

## 2017-05-27 DIAGNOSIS — N390 Urinary tract infection, site not specified: Secondary | ICD-10-CM | POA: Diagnosis not present

## 2017-05-27 DIAGNOSIS — I2510 Atherosclerotic heart disease of native coronary artery without angina pectoris: Secondary | ICD-10-CM | POA: Diagnosis not present

## 2017-05-27 DIAGNOSIS — R269 Unspecified abnormalities of gait and mobility: Secondary | ICD-10-CM | POA: Diagnosis not present

## 2017-05-29 DIAGNOSIS — I2510 Atherosclerotic heart disease of native coronary artery without angina pectoris: Secondary | ICD-10-CM | POA: Diagnosis not present

## 2017-05-29 DIAGNOSIS — I636 Cerebral infarction due to cerebral venous thrombosis, nonpyogenic: Secondary | ICD-10-CM | POA: Diagnosis not present

## 2017-05-29 DIAGNOSIS — E119 Type 2 diabetes mellitus without complications: Secondary | ICD-10-CM | POA: Diagnosis not present

## 2017-05-29 DIAGNOSIS — G464 Cerebellar stroke syndrome: Secondary | ICD-10-CM | POA: Diagnosis not present

## 2017-05-31 DIAGNOSIS — N179 Acute kidney failure, unspecified: Secondary | ICD-10-CM | POA: Diagnosis not present

## 2017-05-31 DIAGNOSIS — D649 Anemia, unspecified: Secondary | ICD-10-CM | POA: Diagnosis not present

## 2017-05-31 DIAGNOSIS — M6281 Muscle weakness (generalized): Secondary | ICD-10-CM | POA: Diagnosis not present

## 2017-05-31 DIAGNOSIS — E119 Type 2 diabetes mellitus without complications: Secondary | ICD-10-CM | POA: Diagnosis not present

## 2017-06-05 DIAGNOSIS — I10 Essential (primary) hypertension: Secondary | ICD-10-CM | POA: Diagnosis not present

## 2017-06-05 DIAGNOSIS — I2510 Atherosclerotic heart disease of native coronary artery without angina pectoris: Secondary | ICD-10-CM | POA: Diagnosis not present

## 2017-06-07 DIAGNOSIS — I10 Essential (primary) hypertension: Secondary | ICD-10-CM | POA: Diagnosis not present

## 2017-06-07 DIAGNOSIS — M6281 Muscle weakness (generalized): Secondary | ICD-10-CM | POA: Diagnosis not present

## 2017-06-07 DIAGNOSIS — I2510 Atherosclerotic heart disease of native coronary artery without angina pectoris: Secondary | ICD-10-CM | POA: Diagnosis not present

## 2017-06-07 DIAGNOSIS — R262 Difficulty in walking, not elsewhere classified: Secondary | ICD-10-CM | POA: Diagnosis not present

## 2017-06-10 DIAGNOSIS — G4733 Obstructive sleep apnea (adult) (pediatric): Secondary | ICD-10-CM | POA: Diagnosis not present

## 2017-06-10 DIAGNOSIS — I5033 Acute on chronic diastolic (congestive) heart failure: Secondary | ICD-10-CM | POA: Diagnosis not present

## 2017-06-10 DIAGNOSIS — Z7982 Long term (current) use of aspirin: Secondary | ICD-10-CM | POA: Diagnosis not present

## 2017-06-10 DIAGNOSIS — I69398 Other sequelae of cerebral infarction: Secondary | ICD-10-CM | POA: Diagnosis not present

## 2017-06-10 DIAGNOSIS — Z87440 Personal history of urinary (tract) infections: Secondary | ICD-10-CM | POA: Diagnosis not present

## 2017-06-10 DIAGNOSIS — L89313 Pressure ulcer of right buttock, stage 3: Secondary | ICD-10-CM | POA: Diagnosis not present

## 2017-06-10 DIAGNOSIS — Z7902 Long term (current) use of antithrombotics/antiplatelets: Secondary | ICD-10-CM | POA: Diagnosis not present

## 2017-06-10 DIAGNOSIS — E785 Hyperlipidemia, unspecified: Secondary | ICD-10-CM | POA: Diagnosis not present

## 2017-06-10 DIAGNOSIS — R2689 Other abnormalities of gait and mobility: Secondary | ICD-10-CM | POA: Diagnosis not present

## 2017-06-10 DIAGNOSIS — I2510 Atherosclerotic heart disease of native coronary artery without angina pectoris: Secondary | ICD-10-CM | POA: Diagnosis not present

## 2017-06-10 DIAGNOSIS — E1165 Type 2 diabetes mellitus with hyperglycemia: Secondary | ICD-10-CM | POA: Diagnosis not present

## 2017-06-10 DIAGNOSIS — I110 Hypertensive heart disease with heart failure: Secondary | ICD-10-CM | POA: Diagnosis not present

## 2017-06-11 ENCOUNTER — Telehealth: Payer: Self-pay | Admitting: Nurse Practitioner

## 2017-06-11 NOTE — Telephone Encounter (Signed)
Ok to order 

## 2017-06-11 NOTE — Telephone Encounter (Signed)
She has been admitted at skilled nursing facility. Why do they need home health orders?

## 2017-06-11 NOTE — Telephone Encounter (Signed)
Pt is back him now so they need orders for PT and social worker to help. Please advise.

## 2017-06-11 NOTE — Telephone Encounter (Signed)
Copied from CRM [id number]. Topic: Quick Communication - See Telephone Encounter >> Jun 11, 2017  2:44 PM Raquel Sarna wrote: Antonieta Loveless- [facility] [phone]  Verbal Orders: 2 times a week for 4 weeks 1 times a week for 4 weeks  Medical Social Worker Consultation

## 2017-06-11 NOTE — Telephone Encounter (Signed)
Pecola Leisure is aware.

## 2017-06-13 ENCOUNTER — Telehealth: Payer: Self-pay | Admitting: Nurse Practitioner

## 2017-06-13 NOTE — Telephone Encounter (Signed)
ok 

## 2017-06-13 NOTE — Telephone Encounter (Signed)
Copied from CRM [id number]. Topic: Quick Communication - See Telephone Encounter >> Jun 13, 2017  3:33 PM Oneal Grout wrote: CRM for notification. See Telephone encounter for: 06/13/17. Requesting OT verbal orders 1x a week for 1 week 2x a week for 4 weeks

## 2017-06-14 NOTE — Telephone Encounter (Signed)
Left vm for Megan Atkinson to call back, need to inform of message below.

## 2017-06-15 NOTE — Telephone Encounter (Signed)
Left another vm for Megan Atkinson to call back.

## 2017-06-21 ENCOUNTER — Telehealth: Payer: Self-pay | Admitting: Nurse Practitioner

## 2017-06-21 NOTE — Telephone Encounter (Signed)
Need to assess wound in order to make additional recommendations

## 2017-06-21 NOTE — Telephone Encounter (Signed)
Megan Atkinson is aware. Pt has an appt with [facility] 06/22/17.

## 2017-06-21 NOTE — Telephone Encounter (Signed)
Copied from CRM [id number]. Topic: Quick Communication - See Telephone Encounter >> Jun 21, 2017 11:44 AM Jolayne Haines L wrote: CRM for notification. See Telephone encounter for: 06/21/17.  Noreene Larsson RN, from [facility] called and said she has a stage 2 wound in the crack of her butt. The patient is currently using Desitin that she has at home. She needs orders to use something stronger or if the Desitin is ok she can continue to use that but needs the ok to apply it for her.If so, please let her know so she can add to her chart.    Call back is [phone]

## 2017-06-22 ENCOUNTER — Inpatient Hospital Stay: Payer: Medicare Other | Admitting: Nurse Practitioner

## 2017-06-22 NOTE — Telephone Encounter (Signed)
Pt calling back to advise the transport person could not get her out of her house.  His was afraid he wound hurt his back. Pt has to reschedule, and request 2 people come to get her out. Pt has rescheduled for next available 5/23 at 11 am.

## 2017-06-27 ENCOUNTER — Telehealth: Payer: Self-pay | Admitting: Nurse Practitioner

## 2017-06-27 NOTE — Telephone Encounter (Signed)
Pt has an appt with you on 06/28/17. FYI

## 2017-06-27 NOTE — Telephone Encounter (Signed)
Copied from CRM [id number]. Topic: Quick Communication - See Telephone Encounter >> Jun 27, 2017 11:48 AM Windy Kalata, NT wrote: CRM for notification. See Telephone encounter for: 06/27/17.  Noreene Larsson, RN with [facility] states she seen patient 06/26/17 and states she has a pressure sore on her heel that is a stage 2 due to the patient being wheel chair at home. She would like orders to care for this wound, she will be contacting the wound care as well.   Noreene Larsson Cb# [phone]

## 2017-06-28 ENCOUNTER — Encounter: Payer: Self-pay | Admitting: Nurse Practitioner

## 2017-06-28 ENCOUNTER — Ambulatory Visit: Payer: Medicare Other | Admitting: Nurse Practitioner

## 2017-06-28 VITALS — BP 132/84 | HR 72 | Temp 97.8°F | Ht 67.0 in

## 2017-06-28 DIAGNOSIS — R7303 Prediabetes: Secondary | ICD-10-CM | POA: Diagnosis not present

## 2017-06-28 DIAGNOSIS — I10 Essential (primary) hypertension: Secondary | ICD-10-CM

## 2017-06-28 DIAGNOSIS — D649 Anemia, unspecified: Secondary | ICD-10-CM

## 2017-06-28 DIAGNOSIS — R601 Generalized edema: Secondary | ICD-10-CM | POA: Diagnosis not present

## 2017-06-28 DIAGNOSIS — L03119 Cellulitis of unspecified part of limb: Secondary | ICD-10-CM | POA: Diagnosis not present

## 2017-06-28 DIAGNOSIS — R3981 Functional urinary incontinence: Secondary | ICD-10-CM | POA: Diagnosis not present

## 2017-06-28 DIAGNOSIS — N179 Acute kidney failure, unspecified: Secondary | ICD-10-CM | POA: Diagnosis not present

## 2017-06-28 DIAGNOSIS — S91332A Puncture wound without foreign body, left foot, initial encounter: Secondary | ICD-10-CM

## 2017-06-28 DIAGNOSIS — B351 Tinea unguium: Secondary | ICD-10-CM | POA: Diagnosis not present

## 2017-06-28 DIAGNOSIS — R600 Localized edema: Secondary | ICD-10-CM | POA: Insufficient documentation

## 2017-06-28 DIAGNOSIS — L304 Erythema intertrigo: Secondary | ICD-10-CM

## 2017-06-28 LAB — CBC WITH DIFFERENTIAL/PLATELET
BASOS ABS: 0 10*3/uL (ref 0.0–0.1)
Basophils Relative: 0.6 % (ref 0.0–3.0)
EOS ABS: 0.5 10*3/uL (ref 0.0–0.7)
Eosinophils Relative: 7.1 % — ABNORMAL HIGH (ref 0.0–5.0)
HCT: 33.5 % — ABNORMAL LOW (ref 36.0–46.0)
Hemoglobin: 10.6 g/dL — ABNORMAL LOW (ref 12.0–15.0)
LYMPHS ABS: 0.8 10*3/uL (ref 0.7–4.0)
Lymphocytes Relative: 10.5 % — ABNORMAL LOW (ref 12.0–46.0)
MCHC: 31.8 g/dL (ref 30.0–36.0)
MCV: 95.7 fl (ref 78.0–100.0)
MONO ABS: 0.7 10*3/uL (ref 0.1–1.0)
Monocytes Relative: 9.1 % (ref 3.0–12.0)
NEUTROS PCT: 72.7 % (ref 43.0–77.0)
Neutro Abs: 5.4 10*3/uL (ref 1.4–7.7)
PLATELETS: 248 10*3/uL (ref 150.0–400.0)
RBC: 3.5 Mil/uL — AB (ref 3.87–5.11)
RDW: 18 % — ABNORMAL HIGH (ref 11.5–15.5)
WBC: 7.4 10*3/uL (ref 4.0–10.5)

## 2017-06-28 LAB — BASIC METABOLIC PANEL
BUN: 33 mg/dL — AB (ref 6–23)
CALCIUM: 9.3 mg/dL (ref 8.4–10.5)
CO2: 31 mEq/L (ref 19–32)
CREATININE: 1.25 mg/dL — AB (ref 0.40–1.20)
Chloride: 107 mEq/L (ref 96–112)
GFR: 54.63 mL/min — AB (ref >60.00)
GLUCOSE: 106 mg/dL — AB (ref 70–99)
Potassium: 4.9 mEq/L (ref 3.5–5.1)
Sodium: 145 mEq/L (ref 135–145)

## 2017-06-28 LAB — BRAIN NATRIURETIC PEPTIDE: PRO B NATRI PEPTIDE: 39 pg/mL (ref 0.0–100.0)

## 2017-06-28 MED ORDER — DOXYCYCLINE HYCLATE 100 MG PO TABS: 100.0000 mg | ORAL_TABLET | Freq: Two times a day (BID) | ORAL | 0 refills | Status: DC

## 2017-06-28 MED ORDER — FLUCONAZOLE 150 MG PO TABS: 150.0000 mg | ORAL_TABLET | Freq: Once | ORAL | 0 refills | Status: AC

## 2017-06-28 MED ORDER — MICONAZOLE NITRATE 2 % EX POWD: Freq: Two times a day (BID) | CUTANEOUS | 2 refills | Status: DC

## 2017-06-28 MED ORDER — FUROSEMIDE 20 MG PO TABS: 20.0000 mg | ORAL_TABLET | Freq: Every day | ORAL | 0 refills | Status: DC

## 2017-06-28 NOTE — Progress Notes (Signed)
Subjective:  Patient ID: Megan Atkinson, female    DOB: December 06, 1950  Age: 67 y.o. MRN: [id number]  CC: Wound Check (wound in crack of buttocks, uses desitin on it.) and Follow-up (UTI and sepsis, legs feel weak but everything else feels fine)  Unaccompanied. Use of [facility] bus for transporation.  at home alone.  Hospitalization: 04/06 to 04/12. Diagnosed with sepsis secondary to E. Coli UTI. Treated with IV abx. Hospital stay was complicated by AKI, acute on chronic CHF, and acute ischemic infarct of left PICA. She was discharged to SNF for rehab, then home.  Has F/up appt with Neurology 07/17/2017.  Current home assistance: [facility] help 3x/week (private pay): helps with housekeeping, grocery, and laundry. Home Nurse, nurse aide, OT and PT 2x/week through [facility].  Wound Check  She was originally treated more than 14 days ago (between buttocks). Previous treatment included wound cleansing or irrigation (abd use od destin cream). Her temperature was unmeasured prior to arrival. There has been no drainage from the wound. The pain has not changed.   Report difficulty with bathing due to limited mobility and generalized weakness. Report urinary incontinence due to limited mobility. Has bedside commode. Reports worsening LE edema, pain and redness. Meals consist of frozen precooked meals which she can easily microwave. Declined use of shower chair due to limited space in her bathroom. Denies any fall since discharge from hospital 9month ago normal BM and no stool incontinence.  Persistent LE edema and left heel pain. Has LE pain due to severe edema Reports she has heel wound as result of stepping on a sharp object, ut unsure what it is.  AKI with hypernatremia: Baseline creatinine 0.8, admitted with 4.39 then improved to 1.54 at discharge. FENa consistent with prerenal azotemia. Oral hydration recommended but she has sever LE edema, which poses a challenge.  HTN: Stable No  medication use at this time.  Reviewed lab results, echocardiogram and radiology report from hospital stay.  Outpatient Medications Prior to Visit  Medication Sig Dispense Refill  . atorvastatin (LIPITOR) 80 MG tablet TAKE 1 TABLET BY MOUTH ONCE DAILY AT  6  PM 90 tablet 3  . clopidogrel (PLAVIX) 75 MG tablet Take 1 tablet (75 mg total) by mouth daily. 90 tablet 3  . fluticasone (FLONASE) 50 MCG/ACT nasal spray Place 1 spray into both nostrils daily. (Patient taking differently: Place 1 spray into both nostrils daily as needed for allergies. ) 9.9 g 0  . protein supplement shake (PREMIER PROTEIN) LIQD Take 325 mLs (11 oz total) by mouth 2 (two) times daily between meals. 30 Can 0  . pyridOXINE (VITAMIN B-6) 100 MG tablet Take 100 mg by mouth daily.    Marland Kitchen senna-docusate (SENOKOT-S) 8.6-50 MG tablet Take 2 tablets by mouth 2 (two) times daily. (Patient taking differently: Take 1 tablet by mouth at bedtime as needed for mild constipation or moderate constipation. ) 100 tablet 0   No facility-administered medications prior to visit.     ROS Review of Systems  Constitutional: Positive for malaise/fatigue. Negative for chills and fever.  Respiratory: Negative for shortness of breath.   Cardiovascular: Positive for leg swelling. Negative for chest pain.  Gastrointestinal: Negative for abdominal pain, blood in stool, constipation, diarrhea and melena.  Genitourinary: Negative.   Musculoskeletal: Negative for falls.  Skin:       Left heel wound  Psychiatric/Behavioral: Negative for suicidal ideas. The patient does not have insomnia.    Objective:  BP 132/84 (BP Location: Right  Arm, Patient Position: Sitting, Cuff Size: Normal)   Pulse 72   Temp 97.8 F (36.6 C) (Oral)   SpO2 (!) 89%   BP Readings from Last 3 Encounters:  06/28/17 132/84  05/18/17 (!) 187/68  12/06/16 138/76    Wt Readings from Last 3 Encounters:  05/13/17 (!) 372 lb (168.7 kg)  12/06/16 (!) 358 lb (162.4 kg)    04/26/16 (!) 345 lb (156.5 kg)   Physical Exam  Constitutional: She is oriented to person, place, and time. No distress.  Neck: Normal range of motion. Neck supple.  Cardiovascular: Normal rate.  Pulmonary/Chest: Effort normal. She has no wheezes. She has no rales.  Diminished lung sounds due to body habitus  Abdominal: There is no tenderness.  Musculoskeletal: She exhibits edema and tenderness.  Neurological: She is alert and oriented to person, place, and time.  Skin: Rash noted. There is erythema.     Erythematous macerated intergluteal skin. No pressure ulcer. Left heel wound with surrounding erythema, no drainage.  Psychiatric: She has a normal mood and affect. Her behavior is normal.  Vitals reviewed.  Lab Results  Component Value Date   WBC 7.4 06/28/2017   HGB 10.6 (L) 06/28/2017   HCT 33.5 (L) 06/28/2017   PLT 248.0 06/28/2017   GLUCOSE 106 (H) 06/28/2017   CHOL 143 02/14/2017   TRIG 90 02/14/2017   HDL 47 02/14/2017   LDLDIRECT 148.7 06/01/2011   LDLCALC 78 02/14/2017   ALT 36 05/17/2017   AST 33 05/17/2017   NA 145 06/28/2017   K 4.9 06/28/2017   CL 107 06/28/2017   CREATININE 1.25 (H) 06/28/2017   BUN 33 (H) 06/28/2017   CO2 31 06/28/2017   TSH 2.966 05/13/2017   INR 0.97 03/06/2016   HGBA1C 8.0 (H) 05/13/2017   MICROALBUR 3.2 (H) 04/05/2011    Mr Brain Wo Contrast  Result Date: 05/15/2017 CLINICAL DATA:  Acute presentation with weakness and altered mental status. EXAM: MRI HEAD WITHOUT CONTRAST TECHNIQUE: Multiplanar, multiecho pulse sequences of the brain and surrounding structures were obtained without intravenous contrast. COMPARISON:  CT 05/12/2017.  MRI 03/07/2016. FINDINGS: Brain: There is been extension of acute infarction in the left cerebellum, affecting cerebellar tissue adjacent to the areas of previously seen infarction. No supratentorial acute infarction. There chronic small-vessel ischemic changes of the hemispheric white matter. Study is  abbreviated and motion degraded. No evidence of mass lesion, hydrocephalus or extra-axial collection. Vascular: No vascular information available. Skull and upper cervical spine: No abnormality seen. Sinuses/Orbits: Negative as seen. Other: None IMPRESSION: Abbreviated and motion degraded exam. Areas of acute infarction within the left cerebellum, adjacent to the areas of infarction that were acute in January of 2018. No apparent mass effect or hemorrhage. Electronically Signed   By: Paulina Fusi M.D.   On: 05/15/2017 08:36   Dg Chest Port 1 View  Result Date: 05/15/2017 CLINICAL DATA:  Hypoxia. History of coronary artery disease and CABG, diabetes, obesity. EXAM: PORTABLE CHEST 1 VIEW COMPARISON:  PA and lateral chest x-ray of May 12, 2017 FINDINGS: There is chronic elevation of the right hemidiaphragm. The left lung is well-expanded. The cardiac silhouette is enlarged. The pulmonary vascularity is mildly engorged and indistinct. The sternal wires are intact. There is no definite pleural effusion. IMPRESSION: Marked elevation of the right hemidiaphragm resulting in loss of approximately 1/2 of the right pleural space volume. Cardiomegaly with mild pulmonary vascular congestion suggests low-grade CHF slightly more conspicuous than on the previous study. No acute pneumonia.  Electronically Signed   By: David  [place] M.D.   On: 05/15/2017 11:26    Assessment & Plan:   Allani was seen today for wound check and follow-up.  Diagnoses and all orders for this visit:  Benign essential HTN  Anemia, unspecified type -     CBC w/Diff  Cellulitis of lower extremity, unspecified laterality -     CBC w/Diff -     doxycycline (VIBRA-TABS) 100 MG tablet; Take 1 tablet (100 mg total) by mouth 2 (two) times daily for 7 days. -     DG Foot Complete Left; Future  Prediabetes -     Basic metabolic panel  Prerenal acute renal failure (HCC) -     Basic metabolic panel -     furosemide (LASIX) 20 MG tablet;  Take 1 tablet (20 mg total) by mouth daily. -     Ambulatory referral to Nephrology  Intertrigo -     doxycycline (VIBRA-TABS) 100 MG tablet; Take 1 tablet (100 mg total) by mouth 2 (two) times daily for 7 days. -     miconazole (MICOTIN) 2 % powder; Apply topically 2 times daily at 12 noon and 4 pm. -     fluconazole (DIFLUCAN) 150 MG tablet; Take 1 tablet (150 mg total) by mouth once for 1 dose.  Mycotic toenails -     Ambulatory referral to Podiatry  Anasarca -     Brain natriuretic peptide -     furosemide (LASIX) 20 MG tablet; Take 1 tablet (20 mg total) by mouth daily. -     Ambulatory referral to Nephrology  Functional urinary incontinence  Puncture wound of left heel, initial encounter -     DG Foot Complete Left; Future   I am having Briseidy Fedie start on doxycycline, miconazole, fluconazole, and furosemide. I am also having her maintain her fluticasone, senna-docusate, clopidogrel, atorvastatin, pyridOXINE, and protein supplement shake.  Meds ordered this encounter  Medications  . doxycycline (VIBRA-TABS) 100 MG tablet    Sig: Take 1 tablet (100 mg total) by mouth 2 (two) times daily for 7 days.    Dispense:  14 tablet    Refill:  0    Order Specific Question:   Supervising Provider    Answer:   Dianne Dun [[id number]]  . miconazole (MICOTIN) 2 % powder    Sig: Apply topically 2 times daily at 12 noon and 4 pm.    Dispense:  70 g    Refill:  2    Order Specific Question:   Supervising Provider    Answer:   Dianne Dun [[id number]]  . fluconazole (DIFLUCAN) 150 MG tablet    Sig: Take 1 tablet (150 mg total) by mouth once for 1 dose.    Dispense:  1 tablet    Refill:  0    Order Specific Question:   Supervising Provider    Answer:   Dianne Dun [[id number]]  . furosemide (LASIX) 20 MG tablet    Sig: Take 1 tablet (20 mg total) by mouth daily.    Dispense:  3 tablet    Refill:  0    Order Specific Question:   Supervising Provider    Answer:   Dianne Dun [[id number]]    Follow-up: Return in about 2 weeks (around 07/12/2017) for re eval of gluteal wound and edema.Alysia Penna, NP

## 2017-06-28 NOTE — Patient Instructions (Addendum)
[facility] Nurse and/or nurse aide: Help with bath at least 3x/week (adequate perineal care). Change depends frequently to minimize moisture in perineal region.  Apply prescribed powder 2x/day between buttocks and groin region Cover gluteal ulcer with dry guaze and change daily.  Start doxycycline for cellulitis.  You will be contacted to schedule appt with podiatry.  Improved renal function, but still decreased. Normal BNP. Stable CBC. Entered referral to nephrology. Furosemide x 3days sent to decrease some LE edema.  During office visit I advised about importance of low sodium diet.  DASH Eating Plan DASH stands for "Dietary Approaches to Stop Hypertension." The DASH eating plan is a healthy eating plan that has been shown to reduce high blood pressure (hypertension). It may also reduce your risk for type 2 diabetes, heart disease, and stroke. The DASH eating plan may also help with weight loss. What are tips for following this plan? General guidelines  Avoid eating more than 2,300 mg (milligrams) of salt (sodium) a day. If you have hypertension, you may need to reduce your sodium intake to 1,500 mg a day.  Limit alcohol intake to no more than 1 drink a day for nonpregnant women and 2 drinks a day for men. One drink equals 12 oz of beer, 5 oz of wine, or 1 oz of hard liquor.  Work with your health care provider to maintain a healthy body weight or to lose weight. Ask what an ideal weight is for you.  Get at least 30 minutes of exercise that causes your heart to beat faster (aerobic exercise) most days of the week. Activities may include walking, swimming, or biking.  Work with your health care provider or diet and nutrition specialist (dietitian) to adjust your eating plan to your individual calorie needs. Reading food labels  Check food labels for the amount of sodium per serving. Choose foods with less than 5 percent of the Daily Value of sodium. Generally, foods  with less than 300 mg of sodium per serving fit into this eating plan.  To find whole grains, look for the word "whole" as the first word in the ingredient list. Shopping  Buy products labeled as "low-sodium" or "no salt added."  Buy fresh foods. Avoid canned foods and premade or frozen meals. Cooking  Avoid adding salt when cooking. Use salt-free seasonings or herbs instead of table salt or sea salt. Check with your health care provider or pharmacist before using salt substitutes.  Do not fry foods. Cook foods using healthy methods such as baking, boiling, grilling, and broiling instead.  Cook with heart-healthy oils, such as olive, canola, soybean, or sunflower oil. Meal planning   Eat a balanced diet that includes: ? 5 or more servings of fruits and vegetables each day. At each meal, try to fill half of your plate with fruits and vegetables. ? Up to 6-8 servings of whole grains each day. ? Less than 6 oz of lean meat, poultry, or fish each day. A 3-oz serving of meat is about the same size as a deck of cards. One egg equals 1 oz. ? 2 servings of low-fat dairy each day. ? A serving of nuts, seeds, or beans 5 times each week. ? Heart-healthy fats. Healthy fats called Omega-3 fatty acids are found in foods such as flaxseeds and coldwater fish, like sardines, salmon, and mackerel.  Limit how much you eat of the following: ? Canned or prepackaged foods. ? Food that is high in trans fat, such as fried foods. ?  Food that is high in saturated fat, such as fatty meat. ? Sweets, desserts, sugary drinks, and other foods with added sugar. ? Full-fat dairy products.  Do not salt foods before eating.  Try to eat at least 2 vegetarian meals each week.  Eat more home-cooked food and less restaurant, buffet, and fast food.  When eating at a restaurant, ask that your food be prepared with less salt or no salt, if possible. What foods are recommended? The items listed may not be a complete  list. Talk with your dietitian about what dietary choices are best for you. Grains Whole-grain or whole-wheat bread. Whole-grain or whole-wheat pasta. Brown rice. Orpah Cobb. Bulgur. Whole-grain and low-sodium cereals. Pita bread. Low-fat, low-sodium crackers. Whole-wheat flour tortillas. Vegetables Fresh or frozen vegetables (raw, steamed, roasted, or grilled). Low-sodium or reduced-sodium tomato and vegetable juice. Low-sodium or reduced-sodium tomato sauce and tomato paste. Low-sodium or reduced-sodium canned vegetables. Fruits All fresh, dried, or frozen fruit. Canned fruit in natural juice (without added sugar). Meat and other protein foods Skinless chicken or [place]. Ground chicken or [place]. Pork with fat trimmed off. Fish and seafood. Egg whites. Dried beans, peas, or lentils. Unsalted nuts, nut butters, and seeds. Unsalted canned beans. Lean cuts of beef with fat trimmed off. Low-sodium, lean deli meat. Dairy Low-fat (1%) or fat-free (skim) milk. Fat-free, low-fat, or reduced-fat cheeses. Nonfat, low-sodium ricotta or cottage cheese. Low-fat or nonfat yogurt. Low-fat, low-sodium cheese. Fats and oils Soft margarine without trans fats. Vegetable oil. Low-fat, reduced-fat, or light mayonnaise and salad dressings (reduced-sodium). Canola, safflower, olive, soybean, and sunflower oils. Avocado. Seasoning and other foods Herbs. Spices. Seasoning mixes without salt. Unsalted popcorn and pretzels. Fat-free sweets. What foods are not recommended? The items listed may not be a complete list. Talk with your dietitian about what dietary choices are best for you. Grains Baked goods made with fat, such as croissants, muffins, or some breads. Dry pasta or rice meal packs. Vegetables Creamed or fried vegetables. Vegetables in a cheese sauce. Regular canned vegetables (not low-sodium or reduced-sodium). Regular canned tomato sauce and paste (not low-sodium or reduced-sodium). Regular tomato and  vegetable juice (not low-sodium or reduced-sodium). Rosita Fire. Olives. Fruits Canned fruit in a light or heavy syrup. Fried fruit. Fruit in cream or butter sauce. Meat and other protein foods Fatty cuts of meat. Ribs. Fried meat. Tomasa Blase. Sausage. Bologna and other processed lunch meats. Salami. Fatback. Hotdogs. Bratwurst. Salted nuts and seeds. Canned beans with added salt. Canned or smoked fish. Whole eggs or egg yolks. Chicken or [place] with skin. Dairy Whole or 2% milk, cream, and half-and-half. Whole or full-fat cream cheese. Whole-fat or sweetened yogurt. Full-fat cheese. Nondairy creamers. Whipped toppings. Processed cheese and cheese spreads. Fats and oils Butter. Stick margarine. Lard. Shortening. Ghee. Bacon fat. Tropical oils, such as coconut, palm kernel, or palm oil. Seasoning and other foods Salted popcorn and pretzels. Onion salt, garlic salt, seasoned salt, table salt, and sea salt. Worcestershire sauce. Tartar sauce. Barbecue sauce. Teriyaki sauce. Soy sauce, including reduced-sodium. Steak sauce. Canned and packaged gravies. Fish sauce. Oyster sauce. Cocktail sauce. Horseradish that you find on the shelf. Ketchup. Mustard. Meat flavorings and tenderizers. Bouillon cubes. Hot sauce and Tabasco sauce. Premade or packaged marinades. Premade or packaged taco seasonings. Relishes. Regular salad dressings. Where to find more information:  National Heart, Lung, and Blood Institute: PopSteam.is  American Heart Association: www.heart.org Summary  The DASH eating plan is a healthy eating plan that has been shown to reduce high blood  pressure (hypertension). It may also reduce your risk for type 2 diabetes, heart disease, and stroke.  With the DASH eating plan, you should limit salt (sodium) intake to 2,300 mg a day. If you have hypertension, you may need to reduce your sodium intake to 1,500 mg a day.  When on the DASH eating plan, aim to eat more fresh fruits and vegetables, whole  grains, lean proteins, low-fat dairy, and heart-healthy fats.  Work with your health care provider or diet and nutrition specialist (dietitian) to adjust your eating plan to your individual calorie needs. This information is not intended to replace advice given to you by your health care provider. Make sure you discuss any questions you have with your health care provider. Document Released: 01/12/2011 Document Revised: 01/17/2016 Document Reviewed: 01/17/2016 Elsevier Interactive Patient Education  [employer].

## 2017-06-28 NOTE — Telephone Encounter (Signed)
Spoke with Noreene Larsson, gave [facility] instruction from [facility] of wound care.   Noreene Larsson gave fax # to fax the order to [facility]: [phone]

## 2017-06-29 ENCOUNTER — Encounter: Payer: Self-pay | Admitting: Nurse Practitioner

## 2017-06-29 ENCOUNTER — Telehealth: Payer: Self-pay | Admitting: Nurse Practitioner

## 2017-06-29 NOTE — Telephone Encounter (Signed)
Pt stated she will come in on her appt date ob 07/12/2017.

## 2017-07-04 ENCOUNTER — Encounter (HOSPITAL_COMMUNITY): Payer: Self-pay | Admitting: Emergency Medicine

## 2017-07-04 ENCOUNTER — Emergency Department (HOSPITAL_COMMUNITY): Payer: Medicare Other

## 2017-07-04 ENCOUNTER — Other Ambulatory Visit: Payer: Self-pay

## 2017-07-04 ENCOUNTER — Inpatient Hospital Stay (HOSPITAL_COMMUNITY): Payer: Medicare Other

## 2017-07-04 ENCOUNTER — Inpatient Hospital Stay (HOSPITAL_COMMUNITY)
Admission: EM | Admit: 2017-07-04 | Discharge: 2017-07-08 | DRG: 291 | Disposition: A | Discharge status: Home Health | Payer: Medicare Other | Attending: Internal Medicine | Admitting: Internal Medicine

## 2017-07-04 DIAGNOSIS — I2510 Atherosclerotic heart disease of native coronary artery without angina pectoris: Secondary | ICD-10-CM | POA: Diagnosis not present

## 2017-07-04 DIAGNOSIS — Z6841 Body Mass Index (BMI) 40.0 and over, adult: Secondary | ICD-10-CM | POA: Diagnosis not present

## 2017-07-04 DIAGNOSIS — L03119 Cellulitis of unspecified part of limb: Secondary | ICD-10-CM

## 2017-07-04 DIAGNOSIS — R402441 Other coma, without documented Glasgow coma scale score, or with partial score reported, in the field [EMT or ambulance]: Secondary | ICD-10-CM | POA: Diagnosis not present

## 2017-07-04 DIAGNOSIS — I10 Essential (primary) hypertension: Secondary | ICD-10-CM | POA: Diagnosis present

## 2017-07-04 DIAGNOSIS — E119 Type 2 diabetes mellitus without complications: Secondary | ICD-10-CM | POA: Insufficient documentation

## 2017-07-04 DIAGNOSIS — L89620 Pressure ulcer of left heel, unstageable: Secondary | ICD-10-CM | POA: Diagnosis not present

## 2017-07-04 DIAGNOSIS — R319 Hematuria, unspecified: Secondary | ICD-10-CM | POA: Diagnosis not present

## 2017-07-04 DIAGNOSIS — Z8673 Personal history of transient ischemic attack (TIA), and cerebral infarction without residual deficits: Secondary | ICD-10-CM

## 2017-07-04 DIAGNOSIS — N200 Calculus of kidney: Secondary | ICD-10-CM | POA: Diagnosis present

## 2017-07-04 DIAGNOSIS — G4733 Obstructive sleep apnea (adult) (pediatric): Secondary | ICD-10-CM | POA: Diagnosis present

## 2017-07-04 DIAGNOSIS — Z7902 Long term (current) use of antithrombotics/antiplatelets: Secondary | ICD-10-CM | POA: Diagnosis not present

## 2017-07-04 DIAGNOSIS — I110 Hypertensive heart disease with heart failure: Secondary | ICD-10-CM | POA: Diagnosis not present

## 2017-07-04 DIAGNOSIS — I5033 Acute on chronic diastolic (congestive) heart failure: Secondary | ICD-10-CM | POA: Diagnosis not present

## 2017-07-04 DIAGNOSIS — R310 Gross hematuria: Secondary | ICD-10-CM | POA: Diagnosis present

## 2017-07-04 DIAGNOSIS — E1165 Type 2 diabetes mellitus with hyperglycemia: Secondary | ICD-10-CM

## 2017-07-04 DIAGNOSIS — J9811 Atelectasis: Secondary | ICD-10-CM | POA: Diagnosis present

## 2017-07-04 DIAGNOSIS — L03116 Cellulitis of left lower limb: Secondary | ICD-10-CM | POA: Diagnosis not present

## 2017-07-04 DIAGNOSIS — N12 Tubulo-interstitial nephritis, not specified as acute or chronic: Secondary | ICD-10-CM

## 2017-07-04 DIAGNOSIS — J9601 Acute respiratory failure with hypoxia: Secondary | ICD-10-CM | POA: Diagnosis not present

## 2017-07-04 DIAGNOSIS — L304 Erythema intertrigo: Secondary | ICD-10-CM

## 2017-07-04 DIAGNOSIS — I5023 Acute on chronic systolic (congestive) heart failure: Secondary | ICD-10-CM

## 2017-07-04 DIAGNOSIS — G9341 Metabolic encephalopathy: Secondary | ICD-10-CM | POA: Diagnosis not present

## 2017-07-04 DIAGNOSIS — K869 Disease of pancreas, unspecified: Secondary | ICD-10-CM | POA: Diagnosis not present

## 2017-07-04 DIAGNOSIS — Z951 Presence of aortocoronary bypass graft: Secondary | ICD-10-CM

## 2017-07-04 DIAGNOSIS — I5031 Acute diastolic (congestive) heart failure: Secondary | ICD-10-CM

## 2017-07-04 DIAGNOSIS — R0902 Hypoxemia: Secondary | ICD-10-CM

## 2017-07-04 DIAGNOSIS — I509 Heart failure, unspecified: Secondary | ICD-10-CM

## 2017-07-04 DIAGNOSIS — R410 Disorientation, unspecified: Secondary | ICD-10-CM | POA: Diagnosis not present

## 2017-07-04 DIAGNOSIS — K8689 Other specified diseases of pancreas: Secondary | ICD-10-CM

## 2017-07-04 DIAGNOSIS — R4182 Altered mental status, unspecified: Secondary | ICD-10-CM | POA: Diagnosis not present

## 2017-07-04 DIAGNOSIS — E669 Obesity, unspecified: Secondary | ICD-10-CM | POA: Diagnosis present

## 2017-07-04 DIAGNOSIS — R0602 Shortness of breath: Secondary | ICD-10-CM | POA: Diagnosis not present

## 2017-07-04 DIAGNOSIS — L8990 Pressure ulcer of unspecified site, unspecified stage: Secondary | ICD-10-CM

## 2017-07-04 DIAGNOSIS — Z8249 Family history of ischemic heart disease and other diseases of the circulatory system: Secondary | ICD-10-CM

## 2017-07-04 DIAGNOSIS — E785 Hyperlipidemia, unspecified: Secondary | ICD-10-CM | POA: Diagnosis present

## 2017-07-04 LAB — URINALYSIS, ROUTINE W REFLEX MICROSCOPIC

## 2017-07-04 LAB — BLOOD GAS, VENOUS
Acid-Base Excess: 0.5 mmol/L (ref 0.0–2.0)
Bicarbonate: 29.3 mmol/L — ABNORMAL HIGH (ref 20.0–28.0)
O2 Saturation: 48 %
PCO2 VEN: 75.8 mmHg — AB (ref 44.0–60.0)
PH VEN: 7.212 — AB (ref 7.250–7.430)
Patient temperature: 98.6
pO2, Ven: 33 mmHg (ref 32.0–45.0)

## 2017-07-04 LAB — CK: Total CK: 91 U/L (ref 38–234)

## 2017-07-04 LAB — CBC WITH DIFFERENTIAL/PLATELET
BASOS ABS: 0 10*3/uL (ref 0.0–0.1)
Basophils Relative: 0 %
EOS ABS: 0.3 10*3/uL (ref 0.0–0.7)
Eosinophils Relative: 3 %
HEMATOCRIT: 33.9 % — AB (ref 36.0–46.0)
HEMOGLOBIN: 10 g/dL — AB (ref 12.0–15.0)
Lymphocytes Relative: 11 %
Lymphs Abs: 1.1 10*3/uL (ref 0.7–4.0)
MCH: 29.9 pg (ref 26.0–34.0)
MCHC: 29.5 g/dL — ABNORMAL LOW (ref 30.0–36.0)
MCV: 101.5 fL — ABNORMAL HIGH (ref 78.0–100.0)
Monocytes Absolute: 0.9 10*3/uL (ref 0.1–1.0)
Monocytes Relative: 9 %
NEUTROS ABS: 7.6 10*3/uL (ref 1.7–7.7)
NEUTROS PCT: 77 %
Platelets: 249 10*3/uL (ref 150–400)
RBC: 3.34 MIL/uL — AB (ref 3.87–5.11)
RDW: 17.3 % — ABNORMAL HIGH (ref 11.5–15.5)
WBC: 9.8 10*3/uL (ref 4.0–10.5)

## 2017-07-04 LAB — TROPONIN I: Troponin I: <0.03 ng/mL (ref <0.03)

## 2017-07-04 LAB — COMPREHENSIVE METABOLIC PANEL
ALBUMIN: 3.1 g/dL — AB (ref 3.5–5.0)
ALK PHOS: 87 U/L (ref 38–126)
ALT: 17 U/L (ref 14–54)
AST: 16 U/L (ref 15–41)
Anion gap: 9 (ref 5–15)
BILIRUBIN TOTAL: 0.5 mg/dL (ref 0.3–1.2)
BUN: 31 mg/dL — AB (ref 6–20)
CALCIUM: 8.7 mg/dL — AB (ref 8.9–10.3)
CO2: 27 mmol/L (ref 22–32)
Chloride: 108 mmol/L (ref 101–111)
Creatinine, Ser: 1.34 mg/dL — ABNORMAL HIGH (ref 0.44–1.00)
GFR calc Af Amer: 46 mL/min — ABNORMAL LOW (ref >60)
GFR calc non Af Amer: 39 mL/min — ABNORMAL LOW (ref >60)
GLUCOSE: 108 mg/dL — AB (ref 65–99)
POTASSIUM: 4.3 mmol/L (ref 3.5–5.1)
SODIUM: 144 mmol/L (ref 135–145)
Total Protein: 7 g/dL (ref 6.5–8.1)

## 2017-07-04 LAB — I-STAT TROPONIN, ED: Troponin i, poc: 0 ng/mL (ref 0.00–0.08)

## 2017-07-04 LAB — BRAIN NATRIURETIC PEPTIDE: B NATRIURETIC PEPTIDE 5: 113 pg/mL — AB (ref 0.0–100.0)

## 2017-07-04 LAB — CBG MONITORING, ED: GLUCOSE-CAPILLARY: 112 mg/dL — AB (ref 65–99)

## 2017-07-04 LAB — GLUCOSE, CAPILLARY: GLUCOSE-CAPILLARY: 78 mg/dL (ref 65–99)

## 2017-07-04 LAB — I-STAT CG4 LACTIC ACID, ED: Lactic Acid, Venous: 0.91 mmol/L (ref 0.5–1.9)

## 2017-07-04 MED ORDER — ASPIRIN 81 MG PO CHEW
81.0000 mg | CHEWABLE_TABLET | Freq: Every day | ORAL | Status: DC
  Administered 2017-07-04 – 2017-07-05 (×2): 81 mg via ORAL
  Filled 2017-07-04 (×2): qty 1

## 2017-07-04 MED ORDER — FUROSEMIDE 10 MG/ML IJ SOLN
40.0000 mg | Freq: Two times a day (BID) | INTRAMUSCULAR | Status: DC
  Administered 2017-07-05: 40 mg via INTRAVENOUS
  Filled 2017-07-04: qty 4

## 2017-07-04 MED ORDER — ATORVASTATIN CALCIUM 40 MG PO TABS
80.0000 mg | ORAL_TABLET | Freq: Every day | ORAL | Status: DC
  Administered 2017-07-05 – 2017-07-07 (×3): 80 mg via ORAL
  Filled 2017-07-04 (×3): qty 2

## 2017-07-04 MED ORDER — ENOXAPARIN SODIUM 80 MG/0.8ML ~~LOC~~ SOLN
80.0000 mg | SUBCUTANEOUS | Status: DC
  Administered 2017-07-04 – 2017-07-07 (×4): 80 mg via SUBCUTANEOUS
  Filled 2017-07-04 (×4): qty 0.8

## 2017-07-04 MED ORDER — CEPHALEXIN 500 MG PO CAPS
1000.0000 mg | ORAL_CAPSULE | Freq: Three times a day (TID) | ORAL | Status: DC
  Administered 2017-07-04 – 2017-07-05 (×3): 1000 mg via ORAL
  Filled 2017-07-04 (×3): qty 2

## 2017-07-04 MED ORDER — ONDANSETRON HCL 4 MG PO TABS: 4.0000 mg | ORAL_TABLET | Freq: Four times a day (QID) | ORAL | Status: DC | PRN

## 2017-07-04 MED ORDER — INSULIN ASPART 100 UNIT/ML ~~LOC~~ SOLN
0.0000 [IU] | Freq: Three times a day (TID) | SUBCUTANEOUS | Status: DC
  Administered 2017-07-07 – 2017-07-08 (×2): 2 [IU] via SUBCUTANEOUS

## 2017-07-04 MED ORDER — ONDANSETRON HCL 4 MG/2ML IJ SOLN: 4.0000 mg | Freq: Four times a day (QID) | INTRAMUSCULAR | Status: DC | PRN

## 2017-07-04 MED ORDER — FUROSEMIDE 10 MG/ML IJ SOLN
40.0000 mg | Freq: Once | INTRAMUSCULAR | Status: AC
  Administered 2017-07-04: 40 mg via INTRAVENOUS
  Filled 2017-07-04: qty 4

## 2017-07-04 MED ORDER — FLUTICASONE PROPIONATE 50 MCG/ACT NA SUSP: 1.0000 | Freq: Every day | NASAL | Status: DC | PRN

## 2017-07-04 MED ORDER — DOXYCYCLINE HYCLATE 100 MG PO TABS
100.0000 mg | ORAL_TABLET | Freq: Two times a day (BID) | ORAL | Status: DC
  Administered 2017-07-04 – 2017-07-08 (×8): 100 mg via ORAL
  Filled 2017-07-04 (×8): qty 1

## 2017-07-04 NOTE — H&P (Signed)
History and Physical    Megan Atkinson ZOX:[id number] DOB: [birth year] DOA: 07/04/2017  PCP: Anne Ng, NP   Patient coming from: Home   Chief Complaint: AMS, Hypoxia  HPI: Megan Atkinson is a 67 y.o. female with medical history significant for CAD, diastholic CHF, HTN, Morbid obesity, OSA, DM who brought to the Ed via EMS with reports of AMS, and hypoxia. Patient has a caregiver that checks on her daily and apparently patient had not moved from the chair since they left patient yesterday. Patient has soiled herself, was disoriented, joking and "talking out of her head". Normally she can get up from a walker and clean herself. On EMS arrival O2 sats- 87%, mental status and O2 sats improved after she was placed on 3L [place] O2.  Not on home O2. She saw her PCP a few days ago, doxycycline was prescribed for early cellulitis, patient had not taken it because she had not been able to pick up the prescription. At the time of my evaluation, patient is awake and alert and oriented, able to answer questions appropariately, jokes a bit but I think its her personality. She report SOB, can not tell how long. Reports chronic orthopnea- sleeps upright in a chair, worsening bilateral lower extremity swelling since recent hospital discharge. Reports pain in bilat lower extremity at night only, no change in colour or warmth. She denies chest pain or cough, personal or family hx of blood clots, no fevers or chills, no vomiting, diarrhea.  In the Ed, at the time of my evaluation, her urine was dark appearing, like mixed with blood. She denies frequency or dysuria, or abdominal pain.  Recent Hospital Admission- 4/6- 05/18/17 for UTI Ecoli sepsis, AMS.  ED Course: heart rate- 96- 102, stable blood pressure- systolic 140s, O2 sats > 95% on 3L, WBC normal 9.8, lactic acid normal 0.91.  BNP elevated from prior 113, creatinine at baseline 1.34, as patient's mental status has improved a venous blood gas was done-pH  7.2, PCo2- 75 instead of ABG.  Portable chest x-ray-vascular accentuation, likely due to shallow inspiration, chronic elevation of the right hemidiaphragm.  Patient was given 40 mg Lasix x1 in ED, blood cultures x2 also drawn. Hospitalist was called to admit for CHF exacerbation.  Review of Systems: As per HPI otherwise 10 point review of systems negative.   Past Medical History:  Diagnosis Date  . CAD (coronary artery disease)    small NSTEMI 11/12: LHC 01/06/11:  LAD 90-95%, mD1 90%, pD2 50%,  pOM1 50%, RCA occluded,  EF > 65%;  CABG  12/12: L-LAD  . Diabetes mellitus   . Glucose intolerance (impaired glucose tolerance)    Hemoglobin A1c 6.4 in 12/2010  . HTN (hypertension)    Echo 01/06/11: EF 65%  . Hyperlipidemia   . Obesity   . Stroke [facility]) 03/06/2016    Past Surgical History:  Procedure Laterality Date  . ABDOMINAL HYSTERECTOMY    . APPENDECTOMY    . CORONARY ARTERY BYPASS GRAFT  01/12/2011   Procedure: CORONARY ARTERY BYPASS GRAFTING (CABG);  Surgeon: Alleen Borne, MD;  [facility];  Service: Open Heart Surgery;  Laterality: N/A;  coronary artery bypass graft times one using left internal mammary artery . Attempted endoscopic saphenous vein harvest  . LEFT HEART CATHETERIZATION WITH CORONARY ANGIOGRAM N/A 01/06/2011   Procedure: LEFT HEART CATHETERIZATION WITH CORONARY ANGIOGRAM;  Surgeon: Rollene Rotunda, MD;  Location: [facility] CATH LAB;  Service: Cardiovascular;  Laterality: N/A;     reports  that she has never smoked. She has never used smokeless tobacco. She reports that she drinks alcohol. She reports that she does not use drugs.  Allergies  Allergen Reactions  . Lisinopril     cough  . Metformin And Related Diarrhea    A lot of fluid loss from bowel and diarrhea    Family History  Problem Relation Age of Onset  . Coronary artery disease Unknown        No family history  . Heart disease Mother   . Heart disease Father   . Heart disease Maternal Grandmother      Prior to Admission medications   Medication Sig Start Date End Date Taking? Authorizing Provider  atorvastatin (LIPITOR) 80 MG tablet TAKE 1 TABLET BY MOUTH ONCE DAILY AT  6  PM 12/07/16   Lewayne Bunting, MD  clopidogrel (PLAVIX) 75 MG tablet Take 1 tablet (75 mg total) by mouth daily. 12/07/16   Lewayne Bunting, MD  doxycycline (VIBRA-TABS) 100 MG tablet Take 1 tablet (100 mg total) by mouth 2 (two) times daily for 7 days. 06/28/17 07/05/17  Nche, Bonna Gains, NP  fluticasone (FLONASE) 50 MCG/ACT nasal spray Place 1 spray into both nostrils daily. Patient taking differently: Place 1 spray into both nostrils daily as needed for allergies.  04/01/16   Love, Evlyn Kanner, PA-C  furosemide (LASIX) 20 MG tablet Take 1 tablet (20 mg total) by mouth daily. 06/28/17   Nche, Bonna Gains, NP  miconazole (MICOTIN) 2 % powder Apply topically 2 times daily at 12 noon and 4 pm. 06/28/17   Nche, Bonna Gains, NP  protein supplement shake (PREMIER PROTEIN) LIQD Take 325 mLs (11 oz total) by mouth 2 (two) times daily between meals. 05/18/17   Rolly Salter, MD  pyridOXINE (VITAMIN B-6) 100 MG tablet Take 100 mg by mouth daily.    [provider]  senna-docusate (SENOKOT-S) 8.6-50 MG tablet Take 2 tablets by mouth 2 (two) times daily. Patient taking differently: Take 1 tablet by mouth at bedtime as needed for mild constipation or moderate constipation.  03/31/16   Jacquelynn Cree, PA-C    Physical Exam: Vitals:   07/04/17 1532 07/04/17 1605 07/04/17 1608 07/04/17 1700  BP:  (!) 152/59  (!) 149/56  Pulse:  (!) 102  96  Resp:  15  12  Temp:  97.9 F (36.6 C)    TempSrc:  Oral    SpO2: 97% 95%  99%  Weight:   (!) 168.7 kg (372 lb)   Height:    (1.702 m)     Constitutional: Morbidly obese calm, comfortable Vitals:   07/04/17 1532 07/04/17 1605 07/04/17 1608 07/04/17 1700  BP:  (!) 152/59  (!) 149/56  Pulse:  (!) 102  96  Resp:  15  12  Temp:  97.9 F (36.6 C)    TempSrc:  Oral     SpO2: 97% 95%  99%  Weight:   (!) 168.7 kg (372 lb)   Height:    (1.702 m)    Eyes: PERRL, lids and conjunctivae normal ENMT: Mucous membranes are moist. Posterior pharynx clear of any exudate or lesions.Normal dentition.  Neck: normal, supple, no masses, no thyromegaly Respiratory: clear to auscultation bilaterally, crackles right base. Normal respiratory effort. No accessory muscle use.  Cardiovascular: Regular rate and rhythm, no murmurs / rubs / gallops.  Abdomen: no tenderness, no masses palpated. No hepatosplenomegaly. Bowel sounds positive.  Musculoskeletal: no clubbing / cyanosis. No  joint deformity upper and lower extremities. Good ROM, no contractures. Normal muscle tone.   Extremities warm, bilateral lower extremities edematous, weeping, non pitting, nontender, no erythema, no differential warmth. Ulcer heel of left foot ~4 by ~4 cm, dark appearing, no surrounding tenderness, but with drainage- clear appearing. Skin- Posteriorly-excoriations right upper thigh, and in between buttock cheeks, both measuring ~ 6 by 6cm, no surrounding tenderness erythema or warmth. Neurologic: CN 2-12 grossly intact.  Moving all extremities spontaneously Psychiatric: Normal judgment and insight. Alert and oriented x 3. Normal mood.   Labs on Admission: I have personally reviewed following labs and imaging studies  CBC: Recent Labs  Lab 06/28/17 1247 07/04/17 1701  WBC 7.4 9.8  NEUTROABS 5.4 7.6  HGB 10.6* 10.0*  HCT 33.5* 33.9*  MCV 95.7 101.5*  PLT 248.0 249   Basic Metabolic Panel: Recent Labs  Lab 06/28/17 1247 07/04/17 1701  NA 145 144  K 4.9 4.3  CL 107 108  CO2 31 27  GLUCOSE 106* 108*  BUN 33* 31*  CREATININE 1.25* 1.34*  CALCIUM 9.3 8.7*   GFR: Estimated Creatinine Clearance: 65.3 mL/min (A) (by C-G formula based on SCr of 1.34 mg/dL (H)). Liver Function Tests: Recent Labs  Lab 07/04/17 1701  AST 16  ALT 17  ALKPHOS 87  BILITOT 0.5  PROT 7.0  ALBUMIN 3.1*    BNP (last 3 results) Recent Labs    06/28/17 1247  PROBNP 39.0   CBG: Recent Labs  Lab 07/04/17 1606  GLUCAP 112*   Urine analysis:    Component Value Date/Time   COLORURINE RED (A) 05/12/2017 2214   APPEARANCEUR CLOUDY (A) 05/12/2017 2214   LABSPEC 1.014 05/12/2017 2214   PHURINE 8.0 05/12/2017 2214   GLUCOSEU NEGATIVE 05/12/2017 2214   GLUCOSEU NEGATIVE 04/05/2011 1129   HGBUR LARGE (A) 05/12/2017 2214   BILIRUBINUR NEGATIVE 05/12/2017 2214   KETONESUR NEGATIVE 05/12/2017 2214   PROTEINUR 100 (A) 05/12/2017 2214   UROBILINOGEN 0.2 04/05/2011 1129   NITRITE NEGATIVE 05/12/2017 2214   LEUKOCYTESUR LARGE (A) 05/12/2017 2214    Radiological Exams on Admission: Dg Chest Port 1 View  Result Date: 07/04/2017 CLINICAL DATA:  Hypoxia. Shortness of breath. Altered mental status. EXAM: PORTABLE CHEST 1 VIEW COMPARISON:  05/15/2017 and 05/14/2017 FINDINGS: Heart size is normal. There is slight pulmonary vascular prominence, most likely due to a shallow inspiration. Chronic elevation of the right hemidiaphragm. Previous median sternotomy. No significant bone abnormality. IMPRESSION: Accentuated vascularity most likely due to a shallow inspiration. Chronic elevation of the right hemidiaphragm. Electronically Signed   By: Francene Boyers M.D.   On: 07/04/2017 17:11    EKG: Independently reviewed.  QTc 446, rate 99, sinus regular.  Assessment/Plan Principal Problem:   CHF exacerbation (HCC) Active Problems:   CAD (coronary artery disease)   Hypertension   Diabetes mellitus (HCC)   OSA (obstructive sleep apnea)   Morbid obesity (HCC)   Benign essential HTN   Cerebral thrombosis with cerebral infarction  CHF exacerbation-hypoxia- 87%, shortness of breath, edematous bilateral lower extremities with weeping.  Stat troponin negative EKG unchanged.  BNP elevated 113 from prior 59, chest x-ray vascular congestion.  Recent echo 05/13/17-EF 65-70%, grade 1DD, PA pressure 45.  Home  medication Lasix 20 daily, reports compliance, denies dietary indiscretion. - Continue IV Lasix started in ED 40 mg twice daily -Strict input output Daily weights fluid restrict -BMP a.m. -Tropes x3 - Bilat lower extremity dopplers  Pressure ulcers- Left heel ulcer, dark appearing with  drainage, no erythema, also excoriations right post thigh and inbetween buttock cheeks.  WBC 9.8.  Afebrile.  Lactic acid 0.9. -Wound care consult -P.o. doxycycline 100 mg daily and p.o. Keflex 1000 mg 3 times daily  Dark urine-likely hematuria, denies dysuria or abdominal or flank pain - UA -Renal ultrasound - check CK, as patient was mostly immobile for several hours.  Metabolic encephalopathy prior to admission, appears to be resolving likely secondary to hypoxia.  -Diuresis  Coronary artery disease history s/p CABG-denies chest pain.  Not on beta-blocker or ACE-inh -Continue Lipitor,  - Started on plavix for 3 weeks last admission for extension of prior CVA infarct  -May benefit from beta-blocker prior to discharge, ?ACE with CKD.  CVA-recent hospital admission, 4/6-4/12/19- MRI showed extension of prior infarct. Evaluated by neurology-recommended aspirin 81 mg and Plavix 75 mg for 3 weeks followed by Plavix alone. - Cont aspirin  daily  HTN- Stable blood pressure -Not on home antihypertensives   DM-glucose - 109, last Hgba1c- 05/13/17- 8.  No hypoglycemic agents listed on med list. - SSI  OSA-  - CPAP   DVT prophylaxis: Lovenox Code Status: Full Family Communication: None at bedside Disposition Plan: Per rounding team Consults called: None  Admission status: Inpt, tele   Onnie Boer MD [facility] Pager [phone] From 6PM-2AM.  Otherwise please contact night-coverage [email or web address] Password [facility]  07/04/2017, 7:08 PM

## 2017-07-04 NOTE — Progress Notes (Signed)
Pt. placed on CPAP per order, per pt., wears n/c under L/FFM currently at 4 lpm-97%, humidifier filled with S/W, tolerating well, made aware to notify if needed.

## 2017-07-04 NOTE — ED Triage Notes (Signed)
Patient BIB [facility] from home for AMS. Pt has caregiver that comes daily who stated pt did not feel well last night and pt had not moved from chair since they left pt yesterday evening. Pt soiled, disoriented, joking and "talking out of her head". Caregiver reports pt normally gets up with walker and cleans herself up. Initial o2 sat 87 RA, after 4L o2 pt more oriented and responsive. Pt has lower leg edema, weeping at this time.

## 2017-07-04 NOTE — ED Provider Notes (Signed)
[facility]-EMERGENCY DEPT Provider Note   CSN: [id number] Arrival date & time: 07/04/17  1509     History   Chief Complaint Chief Complaint  Patient presents with  . Fatigue  . Altered Mental Status    HPI Shyra Emile is a 67 y.o. female hx of CAD, DM, HTN, obesity here with AMS, hypoxia.  Patient is living at home currently and is not very mobile at baseline.  Patient saw her doctor several days ago for leg swelling and redness and was thought to have early cellulitis so was prescribed doxycycline but has not been taking it.  Patient was noted by caregiver today that she is altered and less responsive.  EMS got there she was noted to be hypoxic 87% on room air and she was put on oxygen and her mental status improved.  Patient states that she is not on oxygen at home.  Denies any fevers or chills but states that her legs have been more swollen.   History by patient and EMS     Past Medical History:  Diagnosis Date  . CAD (coronary artery disease)    small NSTEMI 11/12: LHC 01/06/11:  LAD 90-95%, mD1 90%, pD2 50%,  pOM1 50%, RCA occluded,  EF > 65%;  CABG  12/12: L-LAD  . Diabetes mellitus   . Glucose intolerance (impaired glucose tolerance)    Hemoglobin A1c 6.4 in 12/2010  . HTN (hypertension)    Echo 01/06/11: EF 65%  . Hyperlipidemia   . Obesity   . Stroke [facility]) 03/06/2016    Patient Active Problem List   Diagnosis Date Noted  . Functional urinary incontinence 06/28/2017  . Mycotic toenails 06/28/2017  . Intertrigo 06/28/2017  . Bilateral leg edema 06/28/2017  . Cerebral thrombosis with cerebral infarction 05/16/2017  . SIRS (systemic inflammatory response syndrome) (HCC) 05/13/2017  . Acute lower UTI 05/13/2017  . Acute kidney injury (HCC) 05/12/2017  . Elevated troponin 05/12/2017  . Dehydration 05/12/2017  . Hypernatremia 05/12/2017  . Elevated CK 05/12/2017  . Prolonged QT interval 05/12/2017  . Leukocytosis 05/12/2017  . Acute  renal failure (ARF) (HCC) 05/12/2017  . Acute blood loss anemia   . Elevated serum creatinine   . Slow transit constipation   . Benign essential HTN   . Generalized anxiety disorder   . Gait disturbance, post-stroke 03/13/2016  . Cerebellar stroke, acute (HCC) 03/10/2016  . Super obese   . Prediabetes   . Ataxia, post-stroke   . Acute cystitis without hematuria   . CVA (cerebral vascular accident) (HCC) 03/06/2016  . Morbid obesity (HCC) 02/29/2016  . OSA (obstructive sleep apnea) 11/24/2014  . Dyspnea 10/02/2014  . Anemia 04/05/2011  . Urinary tract infection, site not specified 04/05/2011  . Diabetes mellitus (HCC) 02/22/2011  . Hyperlipidemia 02/22/2011  . CAD (coronary artery disease) 01/07/2011  . Hypertension 01/07/2011    Past Surgical History:  Procedure Laterality Date  . ABDOMINAL HYSTERECTOMY    . APPENDECTOMY    . CORONARY ARTERY BYPASS GRAFT  01/12/2011   Procedure: CORONARY ARTERY BYPASS GRAFTING (CABG);  Surgeon: Alleen Borne, MD;  Location: [facility] OR;  Service: Open Heart Surgery;  Laterality: N/A;  coronary artery bypass graft times one using left internal mammary artery . Attempted endoscopic saphenous vein harvest  . LEFT HEART CATHETERIZATION WITH CORONARY ANGIOGRAM N/A 01/06/2011   Procedure: LEFT HEART CATHETERIZATION WITH CORONARY ANGIOGRAM;  Surgeon: Rollene Rotunda, MD;  Location[facility] C[facility] LAB;  Service: Cardiovascular;  Laterality: N/A;  OB History   None      Home Medications    Prior to Admission medications   Medication Sig Start Date End Date Taking? Authorizing Provider  atorvastatin (LIPITOR) 80 MG tablet TAKE 1 TABLET BY MOUTH ONCE DAILY AT  6  PM 12/07/16   Lewayne Bunting, MD  clopidogrel (PLAVIX) 75 MG tablet Take 1 tablet (75 mg total) by mouth daily. 12/07/16   Lewayne Bunting, MD  doxycycline (VIBRA-TABS) 100 MG tablet Take 1 tablet (100 mg total) by mouth 2 (two) times daily for 7 days. 06/28/17 07/05/17  Nche, Bonna Gains, NP    fluticasone (FLONASE) 50 MCG/ACT nasal spray Place 1 spray into both nostrils daily. Patient taking differently: Place 1 spray into both nostrils daily as needed for allergies.  04/01/16   Love, Evlyn Kanner, PA-C  furosemide (LASIX) 20 MG tablet Take 1 tablet (20 mg total) by mouth daily. 06/28/17   Nche, Bonna Gains, NP  miconazole (MICOTIN) 2 % powder Apply topically 2 times daily at 12 noon and 4 pm. 06/28/17   Nche, Bonna Gains, NP  protein supplement shake (PREMIER PROTEIN) LIQD Take 325 mLs (11 oz total) by mouth 2 (two) times daily between meals. 05/18/17   Rolly Salter, MD  pyridOXINE (VITAMIN B-6) 100 MG tablet Take 100 mg by mouth daily.    [provider]  senna-docusate (SENOKOT-S) 8.6-50 MG tablet Take 2 tablets by mouth 2 (two) times daily. Patient taking differently: Take 1 tablet by mouth at bedtime as needed for mild constipation or moderate constipation.  03/31/16   Jacquelynn Cree, PA-C    Family History Family History  Problem Relation Age of Onset  . Coronary artery disease Unknown        No family history  . Heart disease Mother   . Heart disease Father   . Heart disease Maternal Grandmother     Social History Social History   Tobacco Use  . Smoking status: Never Smoker  . Smokeless tobacco: Never Used  Substance Use Topics  . Alcohol use: Yes    Comment: Occasional  . Drug use: No     Allergies   Lisinopril and Metformin and related   Review of Systems Review of Systems  Psychiatric/Behavioral: Positive for confusion.  All other systems reviewed and are negative.    Physical Exam Updated Vital Signs BP (!) 152/59 (BP Location: Left Arm)   Pulse (!) 102   Temp 97.9 F (36.6 C) (Oral)   Resp 15   Ht  (1.702 m)   Wt (!) 168.7 kg (372 lb)   SpO2 95%   BMI 58.26 kg/m   Physical Exam  Constitutional: She is oriented to person, place, and time.  Confused   HENT:  Head: Normocephalic.  Mouth/Throat: Oropharynx is clear and  moist.  Eyes: Pupils are equal, round, and reactive to light. Conjunctivae and EOM are normal.  Neck: Normal range of motion. Neck supple.  Cardiovascular: Normal rate, regular rhythm and normal heart sounds.  Pulmonary/Chest:  Slightly tachypneic, crackles bilateral bases   Abdominal: Soft. Bowel sounds are normal. She exhibits no distension. There is no tenderness. There is no guarding.  Musculoskeletal:  2+ edema bilaterally, some venous stasis changes. Early cellulitis vs yeast in the groin   Neurological: She is alert and oriented to person, place, and time.  Skin: Skin is warm. There is erythema.  Psychiatric: She has a normal mood and affect.  Nursing note and vitals reviewed.  ED Treatments / Results  Labs (all labs ordered are listed, but only abnormal results are displayed) Labs Reviewed  CBG MONITORING, ED - Abnormal; Notable for the following components:      Result Value   Glucose-Capillary 112 (*)    All other components within normal limits  CULTURE, BLOOD (ROUTINE X 2)  CULTURE, BLOOD (ROUTINE X 2)  CBC WITH DIFFERENTIAL/PLATELET  COMPREHENSIVE METABOLIC PANEL  BRAIN NATRIURETIC PEPTIDE  URINALYSIS, ROUTINE W REFLEX MICROSCOPIC  BLOOD GAS, VENOUS  I-STAT TROPONIN, ED  I-STAT CG4 LACTIC ACID, ED    EKG None  Radiology No results found.  Procedures Procedures (including critical care time)  Medications Ordered in ED Medications - No data to display   Initial Impression / Assessment and Plan / ED Course  I have reviewed the triage vital signs and the nursing notes.  Pertinent labs & imaging results that were available during my care of the patient were reviewed by me and considered in my medical decision making (see chart for details).    Blayke Pinera is a 67 y.o. female here with leg swelling, SOB, hypoxia. Patient has diastolic dysfunction. I wonder if she has CHF exacerbation causing her hypoxia and leg swelling. Also consider leg cellulitis  as well. Will get labs, BNP, CXR, lactate, cultures. Will likely need admission.   5:58 PM CXR showed some edema. WBC nl, lactate nl. I think likely CHF exacerbation. Given lasix. Will admit for hypoxia from likely CHF exacerbation. Hold abx for now. I think likely venous stasis changes.    Final Clinical Impressions(s) / ED Diagnoses   Final diagnoses:  None    ED Discharge Orders    None       Charlynne Pander, MD 07/04/17 1759

## 2017-07-04 NOTE — ED Notes (Signed)
Bed: [facility] Expected date:  Expected time:  Means of arrival:  Comments: 

## 2017-07-04 NOTE — ED Notes (Signed)
Patient has breakdown on left heel, sacrum, and back of left thigh.

## 2017-07-04 NOTE — Progress Notes (Signed)
ED TO INPATIENT HANDOFF REPORT  Name/Age/Gender Megan Atkinson 67 y.o. female  Code Status Code Status History    Date Active Date Inactive Code Status Order ID Comments User Context   05/13/2017 0609 05/19/2017 0023 Full Code [id number]  [name], MD Inpatient   03/10/2016 1503 03/31/2016 1701 Full Code [id number]  Flora Lipps Inpatient   03/10/2016 1503 03/10/2016 1503 Full Code [id number]  Flora Lipps Inpatient   03/06/2016 1747 03/10/2016 1454 Full Code [id number]  [name], MD ED      Home/SNF/Other Home  Chief Complaint AMS, Lethargic  Level of Care/Admitting Diagnosis ED Disposition    ED Disposition Condition [facility] Area: [place] [[id number]]  Level of Care: Telemetry [5]  Admit to tele based on following criteria: Other see comments  Comments: CHF exacerbation  Diagnosis: CHF exacerbation [facility]) [[id number]]  Admitting Physician: Bethena Roys [phone]  Attending Physician: Bethena Roys [phone]  Estimated length of stay: past midnight tomorrow  Certification:: I certify this patient will need inpatient services for at least 2 midnights  PT Class (Do Not Modify): Inpatient [101]  PT Acc Code (Do Not Modify): Private [1]       Medical History Past Medical History:  Diagnosis Date  . CAD (coronary artery disease)    small NSTEMI 11/12: LHC 01/06/11:  LAD 90-95%, mD1 90%, pD2 50%,  pOM1 50%, RCA occluded,  EF > 65%;  CABG  12/12: L-LAD  . Diabetes mellitus   . Glucose intolerance (impaired glucose tolerance)    Hemoglobin A1c 6.4 in 12/2010  . HTN (hypertension)    Echo 01/06/11: EF 65%  . Hyperlipidemia   . Obesity   . Stroke ([place]) 03/06/2016    Allergies Allergies  Allergen Reactions  . Lisinopril     cough  . Metformin And Related Diarrhea    A lot of fluid loss from bowel and diarrhea    IV Location/Drains/Wounds Patient Lines/Drains/Airways Status   Active  Line/Drains/Airways    Name:   Placement date:   Placement time:   Site:   Days:   Peripheral IV 07/04/17 Right Antecubital   07/04/17    1701    Antecubital   less than 1   External Urinary Catheter   05/12/17    1932    -   53   External Urinary Catheter   -    -    -      AIRWAYS 8 mm   01/12/11    0757     2365   Incision Wrist Right   -    -        Incision 01/12/11 Chest Other (Comment)   01/12/11    1113     2365   Incision 01/12/11 Leg Right   01/12/11    1131     2365   Wound / Incision (Open or Dehisced) 05/13/17 Other (Comment) Buttocks Left;Right;Medial open skin related to MASD   05/13/17    0200    Buttocks   52          Labs/Imaging Results for orders placed or performed during the hospital encounter of 07/04/17 (from the past 48 hour(s))  CBG monitoring, ED     Status: Abnormal   Collection Time: 07/04/17  4:06 PM  Result Value Ref Range   Glucose-Capillary 112 (H) 65 - 99 mg/dL  Blood gas, venous     Status: Abnormal  Collection Time: 07/04/17  4:55 PM  Result Value Ref Range   pH, Ven 7.212 (L) 7.250 - 7.430   pCO2, Ven 75.8 (HH) 44.0 - 60.0 mmHg    Comment: RBV EMILY, RN AT 1708 BY DEE WALTERS ON 07/04/2017   pO2, Ven 33.0 32.0 - 45.0 mmHg   Bicarbonate 29.3 (H) 20.0 - 28.0 mmol/L   Acid-Base Excess 0.5 0.0 - 2.0 mmol/L   O2 Saturation 48.0 %   Patient temperature 98.6    Collection site DRAWN BY RN    Drawn by DRAWN BY RN    Sample type VEIN     Comment: Performed at [facility], [place] [address], [place], [place] [address]  I-stat troponin, ED     Status: None   Collection Time: 07/04/17  5:00 PM  Result Value Ref Range   Troponin i, poc 0.00 0.00 - 0.08 ng/mL   Comment 3            Comment: Due to the release kinetics of cTnI, a negative result within the first hours of the onset of symptoms does not rule out myocardial infarction with certainty. If myocardial infarction is still suspected, repeat the test at appropriate  intervals.   CBC with Differential/Platelet     Status: Abnormal   Collection Time: 07/04/17  5:01 PM  Result Value Ref Range   WBC 9.8 4.0 - 10.5 K/uL   RBC 3.34 (L) 3.87 - 5.11 MIL/uL   Hemoglobin 10.0 (L) 12.0 - 15.0 g/dL   HCT 33.9 (L) 36.0 - 46.0 %   MCV 101.5 (H) 78.0 - 100.0 fL   MCH 29.9 26.0 - 34.0 pg   MCHC 29.5 (L) 30.0 - 36.0 g/dL   RDW 17.3 (H) 11.5 - 15.5 %   Platelets 249 150 - 400 K/uL   Neutrophils Relative % 77 %   Neutro Abs 7.6 1.7 - 7.7 K/uL   Lymphocytes Relative 11 %   Lymphs Abs 1.1 0.7 - 4.0 K/uL   Monocytes Relative 9 %   Monocytes Absolute 0.9 0.1 - 1.0 K/uL   Eosinophils Relative 3 %   Eosinophils Absolute 0.3 0.0 - 0.7 K/uL   Basophils Relative 0 %   Basophils Absolute 0.0 0.0 - 0.1 K/uL    Comment: Performed at [facility], [place] [address] [place], [place] [address]  Comprehensive metabolic panel     Status: Abnormal   Collection Time: 07/04/17  5:01 PM  Result Value Ref Range   Sodium 144 135 - 145 mmol/L   Potassium 4.3 3.5 - 5.1 mmol/L   Chloride 108 101 - 111 mmol/L   CO2 27 22 - 32 mmol/L   Glucose, Bld 108 (H) 65 - 99 mg/dL   BUN 31 (H) 6 - 20 mg/dL   Creatinine, Ser 1.34 (H) 0.44 - 1.00 mg/dL   Calcium 8.7 (L) 8.9 - 10.3 mg/dL   Total Protein 7.0 6.5 - 8.1 g/dL   Albumin 3.1 (L) 3.5 - 5.0 g/dL   AST 16 15 - 41 U/L   ALT 17 14 - 54 U/L   Alkaline Phosphatase 87 38 - 126 U/L   Total Bilirubin 0.5 0.3 - 1.2 mg/dL   GFR calc non Af Amer 39 (L) >60 mL/min   GFR calc Af Amer 46 (L) >60 mL/min    Comment: (NOTE) The eGFR has been calculated using the CKD EPI equation. This calculation has not been validated in all clinical situations. eGFR's persistently <60  mL/min signify possible Chronic Kidney Disease.    Anion gap 9 5 - 15    Comment: Performed at [facility], [place] [address] [place] [address], [place], [place] [address]  Brain natriuretic peptide     Status: Abnormal   Collection Time: 07/04/17  5:01 PM   Result Value Ref Range   B Natriuretic Peptide 113.0 (H) 0.0 - 100.0 pg/mL    Comment: Performed at [facility], [place] [address] [place], [place] [address]  Troponin I     Status: None   Collection Time: 07/04/17  5:01 PM  Result Value Ref Range   Troponin I <0.03 <0.03 ng/mL    Comment: Performed at [facility], [place] [address], [place], [place] [address]  I-Stat CG4 Lactic Acid, ED     Status: None   Collection Time: 07/04/17  5:02 PM  Result Value Ref Range   Lactic Acid, Venous 0.91 0.5 - 1.9 mmol/L   Dg Chest Port 1 View  Result Date: 07/04/2017 CLINICAL DATA:  Hypoxia. Shortness of breath. Altered mental status. EXAM: PORTABLE CHEST 1 VIEW COMPARISON:  05/15/2017 and 05/14/2017 FINDINGS: Heart size is normal. There is slight pulmonary vascular prominence, most likely due to a shallow inspiration. Chronic elevation of the right hemidiaphragm. Previous median sternotomy. No significant bone abnormality. IMPRESSION: Accentuated vascularity most likely due to a shallow inspiration. Chronic elevation of the right hemidiaphragm. Electronically Signed   By: Lorriane Shire M.D.   On: 07/04/2017 17:11    Pending Labs Unresulted Labs (From admission, onward)   Start     Ordered   07/04/17 2300  Troponin I (q 6hr x 3)  Now then every 6 hours,   R     07/04/17 1907   07/04/17 1909  CK  Add-on,   R     07/04/17 1908   07/04/17 1907  Urinalysis, Routine w reflex microscopic  Once,   R     07/04/17 1907   07/04/17 1833  Culture, blood (Routine X 2) w Reflex to ID Panel  Once,   STAT     07/04/17 1833   07/04/17 1609  Blood culture (routine x 2)  BLOOD CULTURE X 2,   STAT     07/04/17 1608   Signed and Held  HIV antibody (Routine Testing)  Once,   R     Signed and Held   Signed and Held  Basic metabolic panel  Tomorrow morning,   R     Signed and Held      Vitals/Pain Today's Vitals   07/04/17 1605 07/04/17 1608 07/04/17 1700 07/04/17 1929  BP:  (!) 152/59  (!) 149/56 (!) 135/59  Pulse: (!) 102  96 90  Resp: _0 Temp: 97.9 F (36.6 C)     TempSrc: Oral     SpO2: 95%  99% 100%  Weight:  (!) 372 lb (168.7 kg)    Height:  _1  (1.702 m)    PainSc:  0-No pain      Isolation Precautions No active isolations  Medications Medications  furosemide (LASIX) injection 40 mg (40 mg Intravenous Given 07/04/17 1845)    Mobility walks with device

## 2017-07-05 ENCOUNTER — Inpatient Hospital Stay (HOSPITAL_COMMUNITY): Payer: Medicare Other

## 2017-07-05 DIAGNOSIS — R310 Gross hematuria: Secondary | ICD-10-CM | POA: Diagnosis not present

## 2017-07-05 DIAGNOSIS — M7989 Other specified soft tissue disorders: Secondary | ICD-10-CM | POA: Diagnosis not present

## 2017-07-05 DIAGNOSIS — J9601 Acute respiratory failure with hypoxia: Secondary | ICD-10-CM

## 2017-07-05 DIAGNOSIS — N10 Acute tubulo-interstitial nephritis: Secondary | ICD-10-CM | POA: Diagnosis not present

## 2017-07-05 DIAGNOSIS — E1159 Type 2 diabetes mellitus with other circulatory complications: Secondary | ICD-10-CM | POA: Diagnosis not present

## 2017-07-05 DIAGNOSIS — I10 Essential (primary) hypertension: Secondary | ICD-10-CM | POA: Diagnosis not present

## 2017-07-05 DIAGNOSIS — G4733 Obstructive sleep apnea (adult) (pediatric): Secondary | ICD-10-CM

## 2017-07-05 DIAGNOSIS — R319 Hematuria, unspecified: Secondary | ICD-10-CM | POA: Diagnosis not present

## 2017-07-05 DIAGNOSIS — R0902 Hypoxemia: Secondary | ICD-10-CM | POA: Diagnosis not present

## 2017-07-05 DIAGNOSIS — N200 Calculus of kidney: Secondary | ICD-10-CM | POA: Diagnosis not present

## 2017-07-05 DIAGNOSIS — L8990 Pressure ulcer of unspecified site, unspecified stage: Secondary | ICD-10-CM

## 2017-07-05 DIAGNOSIS — J181 Lobar pneumonia, unspecified organism: Secondary | ICD-10-CM | POA: Diagnosis not present

## 2017-07-05 DIAGNOSIS — I110 Hypertensive heart disease with heart failure: Secondary | ICD-10-CM | POA: Diagnosis not present

## 2017-07-05 LAB — BASIC METABOLIC PANEL
ANION GAP: 7 (ref 5–15)
BUN: 28 mg/dL — AB (ref 6–20)
CHLORIDE: 108 mmol/L (ref 101–111)
CO2: 30 mmol/L (ref 22–32)
Calcium: 8.9 mg/dL (ref 8.9–10.3)
Creatinine, Ser: 1.27 mg/dL — ABNORMAL HIGH (ref 0.44–1.00)
GFR calc Af Amer: 49 mL/min — ABNORMAL LOW (ref >60)
GFR calc non Af Amer: 42 mL/min — ABNORMAL LOW (ref >60)
GLUCOSE: 116 mg/dL — AB (ref 65–99)
POTASSIUM: 4.5 mmol/L (ref 3.5–5.1)
Sodium: 145 mmol/L (ref 135–145)

## 2017-07-05 LAB — URINALYSIS, ROUTINE W REFLEX MICROSCOPIC
BILIRUBIN URINE: NEGATIVE
Glucose, UA: NEGATIVE mg/dL
KETONES UR: NEGATIVE mg/dL
NITRITE: NEGATIVE
PH: 5 (ref 5.0–8.0)
PROTEIN: 30 mg/dL — AB
Specific Gravity, Urine: 1.008 (ref 1.005–1.030)
WBC, UA: >50 WBC/hpf — ABNORMAL HIGH (ref 0–5)

## 2017-07-05 LAB — GLUCOSE, CAPILLARY
Glucose-Capillary: 92 mg/dL (ref 65–99)
Glucose-Capillary: 117 mg/dL — ABNORMAL HIGH (ref 65–99)
Glucose-Capillary: 115 mg/dL — ABNORMAL HIGH (ref 65–99)
Glucose-Capillary: 140 mg/dL — ABNORMAL HIGH (ref 65–99)

## 2017-07-05 LAB — TROPONIN I
Troponin I: <0.03 ng/mL (ref <0.03)
Troponin I: <0.03 ng/mL (ref <0.03)

## 2017-07-05 LAB — HIV ANTIBODY (ROUTINE TESTING W REFLEX): HIV Screen 4th Generation wRfx: NONREACTIVE

## 2017-07-05 LAB — STREP PNEUMONIAE URINARY ANTIGEN: STREP PNEUMO URINARY ANTIGEN: NEGATIVE

## 2017-07-05 MED ORDER — METOPROLOL TARTRATE 25 MG PO TABS
12.5000 mg | ORAL_TABLET | Freq: Two times a day (BID) | ORAL | Status: DC
  Administered 2017-07-05 – 2017-07-08 (×6): 12.5 mg via ORAL
  Filled 2017-07-05 (×6): qty 1

## 2017-07-05 MED ORDER — SODIUM CHLORIDE 0.9 % IV SOLN
1.0000 g | INTRAVENOUS | Status: DC
  Administered 2017-07-05 – 2017-07-07 (×3): 1 g via INTRAVENOUS
  Filled 2017-07-05 (×3): qty 1
  Filled 2017-07-05: qty 10

## 2017-07-05 MED ORDER — FUROSEMIDE 20 MG PO TABS
20.0000 mg | ORAL_TABLET | Freq: Every day | ORAL | Status: DC
  Administered 2017-07-06 – 2017-07-08 (×3): 20 mg via ORAL
  Filled 2017-07-05 (×3): qty 1

## 2017-07-05 MED ORDER — METOPROLOL TARTRATE 25 MG PO TABS: 12.5000 mg | ORAL_TABLET | Freq: Two times a day (BID) | ORAL | Status: DC

## 2017-07-05 MED ORDER — COLLAGENASE 250 UNIT/GM EX OINT
TOPICAL_OINTMENT | Freq: Every day | CUTANEOUS | Status: DC
  Administered 2017-07-05: 1 via TOPICAL
  Administered 2017-07-06 – 2017-07-08 (×3): via TOPICAL
  Filled 2017-07-05: qty 90

## 2017-07-05 MED ORDER — PREMIER PROTEIN SHAKE
11.0000 [oz_av] | Freq: Two times a day (BID) | ORAL | Status: DC
  Administered 2017-07-05 – 2017-07-08 (×4): 11 [oz_av] via ORAL
  Filled 2017-07-05 (×7): qty 325.31

## 2017-07-05 MED ORDER — CLOPIDOGREL BISULFATE 75 MG PO TABS
75.0000 mg | ORAL_TABLET | Freq: Every day | ORAL | Status: DC
  Administered 2017-07-05 – 2017-07-08 (×4): 75 mg via ORAL
  Filled 2017-07-05 (×4): qty 1

## 2017-07-05 MED ORDER — SENNOSIDES-DOCUSATE SODIUM 8.6-50 MG PO TABS
2.0000 | ORAL_TABLET | Freq: Two times a day (BID) | ORAL | Status: DC
  Administered 2017-07-06 – 2017-07-07 (×2): 2 via ORAL
  Filled 2017-07-05 (×6): qty 2

## 2017-07-05 MED ORDER — POLYVINYL ALCOHOL 1.4 % OP SOLN
2.0000 [drp] | OPHTHALMIC | Status: DC | PRN
  Filled 2017-07-05: qty 15

## 2017-07-05 MED ORDER — AZITHROMYCIN 250 MG PO TABS: 500.0000 mg | ORAL_TABLET | Freq: Every day | ORAL | Status: DC

## 2017-07-05 NOTE — Progress Notes (Signed)
Bilateral lower extremity venous duplex has been completed. Negative for obvious evidence of DVT.  07/05/17 12:02 PM Olen Cordial RVT

## 2017-07-05 NOTE — Progress Notes (Signed)
Pt took off CPAP three times. O2 sats dropping in the 80s each time. Each time CPAP was placed back on and patient was educated. Pt eventually refused to wear CPAP because of a feeling of claustrophobia. Placed [place] on 3L back on pt and respiratory notified. Will continue to monitor.

## 2017-07-05 NOTE — Evaluation (Signed)
Physical Therapy Evaluation Patient Details Name: Megan Atkinson MRN: [id number] DOB: 07-01-1950 Today's Date: 07/05/2017   History of Present Illness  67 y.o. female with medical history significant for CAD, diastholic CHF, HTN, Morbid obesity, OSA, DM, and extension of cerebellar CVA (April 2019) who was admitted for AMS and hypoxia, found to have CHF exacerbation  Clinical Impression  Pt admitted with above diagnosis. Pt currently with functional limitations due to the deficits listed below (see PT Problem List).  Pt will benefit from skilled PT to increase their independence and safety with mobility to allow discharge to the venue listed below.  Pt awaken to verbal and tactile stimulation however difficulty remaining awake.  Pt agreeable to sit EOB however unable to lift LEs and self assist.  Pt falls asleep quickly however did report that she has been having difficulty with stairs in home and does not want to d/c to SNF.  Will continue to assist pt with mobility in acute setting.  Recommend SNF at this time.     Follow Up Recommendations SNF    Equipment Recommendations  None recommended by PT    Recommendations for Other Services       Precautions / Restrictions Precautions Precautions: Fall Precaution Comments: monitor sats      Mobility  Bed Mobility Overal bed mobility: Needs Assistance Bed Mobility: Supine to Sit     Supine to sit: Total assist     General bed mobility comments: attempted however pt too weak to assist, also falling asleep in between cues  Transfers                    Ambulation/Gait                Stairs            Wheelchair Mobility    Modified Rankin (Stroke Patients Only)       Balance                                             Pertinent Vitals/Pain Pain Assessment: No/denies pain    Home Living Family/patient expects to be discharged to:: Private residence Living Arrangements:  Alone Available Help at Discharge: Family;Available 24 hours/day Type of Home: House Home Access: Level entry     Home Layout: Multi-level Home Equipment: Walker - 2 wheels;Wheelchair - manual      Prior Function Level of Independence: Independent with assistive device(s)         Comments: Pt was ambulatory with a RW prior to recent admission, reports using more wheelchair now     Hand Dominance        Extremity/Trunk Assessment        Lower Extremity Assessment Lower Extremity Assessment: RLE deficits/detail;LLE deficits/detail RLE Deficits / Details: grossly 2+/5 at best per observation, pt attempting to move against gravity however unable to even perform 50% AROM       Communication   Communication: No difficulties  Cognition Arousal/Alertness: Lethargic Behavior During Therapy: Flat affect                                   General Comments: pt awakens however falls asleep quickly even if engaged      General Comments      Exercises  Assessment/Plan    PT Assessment Patient needs continued PT services  PT Problem List Decreased strength;Decreased mobility;Decreased balance;Decreased activity tolerance;Obesity       PT Treatment Interventions DME instruction;Therapeutic activities;Balance training;Stair training;Functional mobility training;Gait training;Therapeutic exercise;Patient/family education;Wheelchair mobility training    PT Goals (Current goals can be found in the Care Plan section)  Acute Rehab PT Goals PT Goal Formulation: With patient Time For Goal Achievement: 07/19/17 Potential to Achieve Goals: Fair    Frequency Min 2X/week   Barriers to discharge        Co-evaluation               AM-PAC PT "6 Clicks" Daily Activity  Outcome Measure Difficulty turning over in bed (including adjusting bedclothes, sheets and blankets)?: Unable Difficulty moving from lying on back to sitting on the side of the bed? :  Unable Difficulty sitting down on and standing up from a chair with arms (e.g., wheelchair, bedside commode, etc,.)?: Unable Help needed moving to and from a bed to chair (including a wheelchair)?: Total Help needed walking in hospital room?: Total Help needed climbing 3-5 steps with a railing? : Total 6 Click Score: 6    End of Session Equipment Utilized During Treatment: Oxygen Activity Tolerance: Patient limited by lethargy;Patient limited by fatigue Patient left: in bed;with call bell/phone within reach Nurse Communication: Mobility status PT Visit Diagnosis: Other abnormalities of gait and mobility (R26.89);Muscle weakness (generalized) (M62.81)    Time: 1610-9604 PT Time Calculation (min) (ACUTE ONLY): 8 min   Charges:   PT Evaluation $PT Eval Low Complexity: 1 Low     PT G Codes:        Zenovia Jarred, PT, DPT 07/05/2017 Pager: [phone]  Maida Sale E 07/05/2017, 12:37 PM

## 2017-07-05 NOTE — Progress Notes (Signed)
Initial Nutrition Assessment  DOCUMENTATION CODES:   Morbid obesity  INTERVENTION:   Provide Premier Protein BID, each supplement provides 160kcal and 30g protein.   NUTRITION DIAGNOSIS:   Increased nutrient needs related to wound healing as evidenced by estimated needs.  GOAL:   Patient will meet greater than or equal to 90% of their needs  MONITOR:   PO intake, Supplement acceptance, Weight trends, Labs, Skin  REASON FOR ASSESSMENT:   Other (Comment)(Pressure Ulcer Present)    ASSESSMENT:   Patient with PMH significant for HTN, CAD, cerebellar CVA in 2018, and DM2. Recently admitted 4/6-4/12 for fatigue and difficulty ambulating and was d/c to SNF. Presents this admission with acute respiratory failure and pedal edema with surrounding cellulitis.    Pt lethargic upon visit. Unable to answer questions at this time. Spoke with RN who reports pt has consumed 100% of her meals since admit. She had an omelet, toast, and grits for breakfast without complication. Pt has not received her Premier Protein this admission. Will leave order in place and monitor for acceptance. Will try to obtain nutrition history time permitting.   Records indicate Pt's weight has fluctuated from 358 lb on 12/06/16  to 372 lb this admission. Suspect this is fluid related. Will monitor weight this admission. Nutrition-Focused physical exam completed.   Medications reviewed and include: 20 mg lasix Labs reviewed.   NUTRITION - FOCUSED PHYSICAL EXAM:    Most Recent Value  Orbital Region  No depletion  Upper Arm Region  No depletion  Thoracic and Lumbar Region  Unable to assess  Buccal Region  No depletion  Temple Region  No depletion  Clavicle Bone Region  No depletion  Clavicle and Acromion Bone Region  No depletion  Scapular Bone Region  Unable to assess  Dorsal Hand  No depletion  Patellar Region  No depletion  Anterior Thigh Region  No depletion  Posterior Calf Region  No depletion  Edema  (RD Assessment)  Unable to assess     Diet Order:   Diet Order           Diet heart healthy/carb modified Room service appropriate? Yes; Fluid consistency: Thin  Diet effective now          EDUCATION NEEDS:   Not appropriate for education at this time  Skin:  Skin Assessment: Skin Integrity Issues: Skin Integrity Issues:: Stage II Stage II: right sacrum  Last BM:  PTA  Height:   Ht Readings from Last 1 Encounters:  07/04/17  (1.702 m)    Weight:   Wt Readings from Last 1 Encounters:  07/04/17 (!) 372 lb (168.7 kg)    Ideal Body Weight:  61.4 kg  BMI:  Body mass index is 58.26 kg/m.  Estimated Nutritional Needs:   Kcal:  2250-2450 kcal (MSJ)  Protein:  115-125 g  Fluid:  >2.2 L/day    Vanessa Kick RD, LDN Clinical Nutrition Pager # - [phone]

## 2017-07-05 NOTE — Progress Notes (Addendum)
PROGRESS NOTE    Megan Atkinson   ZOX:[id number]  DOB: [birth year]  DOA: 07/04/2017 PCP: Anne Ng, NP   Brief Narrative:  Megan Atkinson is a 67 y/o with morbid obesity, OSA who has stopped using her CPAP, HTN, CAD, cerebellar CVA in 2018, DM2 She was admitted from 4/6-4/12 for fatigue and difficulty ambulating. She had not eaten in 4 days and was found to have AKI with Cr of 4.39 (baseline 1.5) , hypernatremia with sodium of 150, acute metabolic encephalopathy, acute L cerebellar infarct adjacent to the area of her prior infarct and an e coli UTI.   She was d/c'd to a SNF.    Patient has a caregiver that checks on her daily and apparently patient had not moved from the chair since they left patient yesterday. Patient has soiled herself, was disoriented, joking and "talking out of her head".    She is found to be hypoxic with pulse ox of 87% and was started on diuretics for d CHF. Legs are swollen and weeping. Sleeps upright in a chair and had had worsening bilateral lower extremity swelling since recent hospital discharge. She thinks she has been told in the past to keep her legs elevated but she does not.  Her confusion resolved after O2 was placed. CXR did not show any pulmonary edema. BNP was 113. Also noted to have gross hematuria.   She saw her PCP a few days ago, doxycycline was prescribed for early cellulitis- patient had not taken it because she had not been able to pick up the prescription.    Subjective: See above. Adamantly refused to go to SNF again. Noted to have a cough and tells me it is new and productive. No fever or chills.     Assessment & Plan:   Principal Problem:   Acute hypoxemic respiratory failure  - no Pulm edema on CXR- not on O2 at baseline - currently 96% on 4 L - obtain CT chest without contrast first as she states she has had increased cough - Addendum: CT show air bronchograms and consolidation- start Ceftriaxone - d/c Keflex- keep  Doxy-  and check U strep antigen  Active Problems: Pedal edema- weeping/ blisters with surrounding cellulitis - due to sitting and sleeping in recliner - given lasix IV x 2- overflowing the  hold further IV Lasix and resume oral 20 mg daily - will elevate - wound care consulted - Doxy and keflex started for cellulitis - LE duplex negative  Hematuria - recheck UA     Hypertension - d/c Norvasc due to pedal edema- it was d/c'd on last discharge from the hospital - cont Metoprolol    Diabetes mellitus 2  - SSI- not on medication at home - A1c is 8.0- will need to start medications on d/c    OSA (obstructive sleep apnea) - stopped wearing CPAP as she felt the tubing and mask were infected-  - will need to call company to look at it - cont CPAP in hospital    Morbid obesity (HCC) Body mass index is 58.26 kg/m.  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 Disposition Plan: to be determined, PT recommends SNF but she refuses Consultants:   none Procedures:   Venous duplex LE Antimicrobials:  Anti-infectives (From admission, onward)   Start  Dose/Rate Route Frequency Ordered Stop   07/04/17 2200  doxycycline (VIBRA-TABS) tablet 100 mg     100 mg Oral 2 times daily 07/04/17 2119     07/04/17 2200  cephALEXin (KEFLEX) capsule 1,000 mg     1,000 mg Oral Every 8 hours 07/04/17 2119         Objective: Vitals:   07/04/17 1700 07/04/17 1929 07/04/17 2055 07/05/17 0436  BP: (!) 149/56 (!) 135/59 130/65 107/72  Pulse: 96 90 65 91  Resp: Temp:   97.9 F (36.6 C) 98.4 F (36.9 C)  TempSrc:   Oral Oral  SpO2: 99% 100% 99% 96%  Weight:      Height:        Intake/Output Summary (Last 24 hours) at 07/05/2017 1345 Last data filed at 07/05/2017 0300 Gross per 24 hour  Intake -    Output 1200 ml  Net -1200 ml   Filed Weights   07/04/17 1608  Weight: (!) 168.7 kg (372 lb)    Examination: General exam: Appears comfortable  HEENT: PERRLA, oral mucosa moist, no sclera icterus or thrush Respiratory system: Clear to auscultation. Respiratory effort normal. Cardiovascular system: S1 & S2 heard, RRR.   Gastrointestinal system: morbidly obese- Abdomen soft, non-tender, nondistended. Normal bowel sound. No organomegaly Central nervous system: Alert and oriented. No focal neurological deficits. Extremities and skin: No cyanosis, clubbing- 2 + pitting edema with blisters and weeping Psychiatry:  Mood & affect appropriate.     Data Reviewed: I have personally reviewed following labs and imaging studies  CBC: Recent Labs  Lab 07/04/17 1701  WBC 9.8  NEUTROABS 7.6  HGB 10.0*  HCT 33.9*  MCV 101.5*  PLT 249   Basic Metabolic Panel: Recent Labs  Lab 07/04/17 1701 07/05/17 0456  NA 144 145  K 4.3 4.5  CL 108 108  CO2 27 30  GLUCOSE 108* 116*  BUN 31* 28*  CREATININE 1.34* 1.27*  CALCIUM 8.7* 8.9   GFR: Estimated Creatinine Clearance: 68.9 mL/min (A) (by C-G formula based on SCr of 1.27 mg/dL (H)). Liver Function Tests: Recent Labs  Lab 07/04/17 1701  AST 16  ALT 17  ALKPHOS 87  BILITOT 0.5  PROT 7.0  ALBUMIN 3.1*   No results for input(s): LIPASE, AMYLASE in the last 168 hours. No results for input(s): AMMONIA in the last 168 hours. Coagulation Profile: No results for input(s): INR, PROTIME in the last 168 hours. Cardiac Enzymes: Recent Labs  Lab 07/04/17 1701 07/04/17 2308 07/05/17 0456  CKTOTAL 91  --   --   TROPONINI <0.03 <0.03 <0.03   BNP (last 3 results) Recent Labs    06/28/17 1247  PROBNP 39.0   HbA1C: No results for input(s): HGBA1C in the last 72 hours. CBG: Recent Labs  Lab 07/04/17 1606 07/04/17 2251 07/05/17 0735 07/05/17 1209  GLUCAP 112* 78 92 117*   Lipid Profile: No results for input(s): CHOL, HDL,  LDLCALC, TRIG, CHOLHDL, LDLDIRECT in the last 72 hours. Thyroid Function Tests: No results for input(s): TSH, T4TOTAL, FREET4, T3FREE, THYROIDAB in the last 72 hours. Anemia Panel: No results for input(s): VITAMINB12, FOLATE, FERRITIN, TIBC, IRON, RETICCTPCT in the last 72 hours. Urine analysis:    Component Value Date/Time   COLORURINE RED (A) 07/04/2017 1941   APPEARANCEUR CLOUDY (A) 07/04/2017 1941   LABSPEC  07/04/2017 1941    TEST NOT REPORTED DUE TO COLOR INTERFERENCE OF URINE PIGMENT   PHURINE  07/04/2017 1941  TEST NOT REPORTED DUE TO COLOR INTERFERENCE OF URINE PIGMENT   GLUCOSEU (A) 07/04/2017 1941    TEST NOT REPORTED DUE TO COLOR INTERFERENCE OF URINE PIGMENT   GLUCOSEU NEGATIVE 04/05/2011 1129   HGBUR (A) 07/04/2017 1941    TEST NOT REPORTED DUE TO COLOR INTERFERENCE OF URINE PIGMENT   BILIRUBINUR (A) 07/04/2017 1941    TEST NOT REPORTED DUE TO COLOR INTERFERENCE OF URINE PIGMENT   KETONESUR (A) 07/04/2017 1941    TEST NOT REPORTED DUE TO COLOR INTERFERENCE OF URINE PIGMENT   PROTEINUR (A) 07/04/2017 1941    TEST NOT REPORTED DUE TO COLOR INTERFERENCE OF URINE PIGMENT   UROBILINOGEN 0.2 04/05/2011 1129   NITRITE (A) 07/04/2017 1941    TEST NOT REPORTED DUE TO COLOR INTERFERENCE OF URINE PIGMENT   LEUKOCYTESUR (A) 07/04/2017 1941    TEST NOT REPORTED DUE TO COLOR INTERFERENCE OF URINE PIGMENT   Sepsis Labs: (procalcitonin:4,lacticidven:4) )No results found for this or any previous visit (from the past 240 hour(s)).       Radiology Studies: US Renal  Result Date: 07/04/2017 CLINICAL DATA:  Hematuria. EXAM: RENAL / URINARY TRACT ULTRASOUND COMPLETE COMPARISON:  None. FINDINGS: Right Kidney: Evaluation of the right kidney is markedly limited due to the patient's body habitus. Right kidney measures approximately 12 cm. Left Kidney: Evaluation of the left kidney is markedly limited due to the patient's body habitus. Left kidney measures approximately 10.6  cm. Bladder: Appears normal for degree of bladder distention. IMPRESSION: Evaluation is limited due to patient body habitus. No obvious abnormalities. Electronically Signed   By: Gerome Sam III M.D   On: 07/04/2017 23:05   Dg Chest Port 1 View  Result Date: 07/04/2017 CLINICAL DATA:  Hypoxia. Shortness of breath. Altered mental status. EXAM: PORTABLE CHEST 1 VIEW COMPARISON:  05/15/2017 and 05/14/2017 FINDINGS: Heart size is normal. There is slight pulmonary vascular prominence, most likely due to a shallow inspiration. Chronic elevation of the right hemidiaphragm. Previous median sternotomy. No significant bone abnormality. IMPRESSION: Accentuated vascularity most likely due to a shallow inspiration. Chronic elevation of the right hemidiaphragm. Electronically Signed   By: Francene Boyers M.D.   On: 07/04/2017 17:11      Scheduled Meds: . atorvastatin  80 mg Oral q1800  . cephALEXin  1,000 mg Oral Q8H  . clopidogrel  75 mg Oral Daily  . collagenase   Topical Daily  . doxycycline  100 mg Oral BID  . enoxaparin (LOVENOX) injection  80 mg Subcutaneous Q24H  . insulin aspart  0-9 Units Subcutaneous TID WC  . metoprolol tartrate  12.5 mg Oral BID   Continuous Infusions:   LOS: 1 day    Time spent in minutes: 40    Calvert Cantor, MD [facility] Pager: [email or web address] Password [email or web address] 07/05/2017, 1:45 PM

## 2017-07-05 NOTE — Consult Note (Signed)
WOC Nurse wound consult note Reason for Consult:posterior left heel unstageable, right lateral calf full thickness, right and left posterior thigh and right medial buttock MASD, left posterior leg cellulitis, no wound just edematous, multiple areas with intertriginous associated dermitis, bilateral groins, under both breasts, in multiple skin folds on abdominal area Wound type:venous stasis, neuropathic, MASD Pressure Injury POA: Yes/No/NA Measurement:Left heel 1.2cm x 4cm unknown depth 50% pink 50% black wound bed, scant brown exudate Right lateral calf 7.5cm x 4.5cm unknown depth, full thickness 100% yellow slough, scant yellow exudate and >3cm of erythema surrounding.. Medial right buttock 0.5cm x 1cm x 0.1cm partial thickness, 100% pink wound bed. Medial posterior right thigh 2.5cm x 5cm x 0.1cm partial thickness MASD IAD, medial posterior left thigh 1cm x 1cm x 0.1cm partial thickness MASD IAD. Left posterior leg has 30 cm x 20cm cellulitis area, intact but erythema, warm and edematous. Plantar surface of right heel has 6cm x 6cm x 0cm white area that appears to be evolving into either wound or bulla.  Wound bed: see above Drainage (amount, consistency, odor) see above, no odor Periwound: see above Dressing procedure/placement/frequency: I have provided nurses with orders for Santyl enzymatic ointment for right lateral calf and left posterior left heel. Purple top skin barrier for MASD areas, InterDry for intertriginous areas. Prevalon boots for both feet, patient already on a low air loss specialty bed. Despite patient's obesity she is borderline malnourished. Nutritional consult could help with wound healing, please order if you agree. We will not follow, but will remain available to this patient, to nursing, and the medical and/or surgical teams.  Please re-consult if we need to assist further.  Barnett Hatter, RN-C, WTA-C, OCA Wound Treatment Associate Ostomy Care Associate

## 2017-07-06 ENCOUNTER — Inpatient Hospital Stay (HOSPITAL_COMMUNITY): Payer: Medicare Other

## 2017-07-06 DIAGNOSIS — N200 Calculus of kidney: Secondary | ICD-10-CM | POA: Diagnosis not present

## 2017-07-06 DIAGNOSIS — N12 Tubulo-interstitial nephritis, not specified as acute or chronic: Secondary | ICD-10-CM

## 2017-07-06 DIAGNOSIS — I10 Essential (primary) hypertension: Secondary | ICD-10-CM | POA: Diagnosis not present

## 2017-07-06 DIAGNOSIS — R310 Gross hematuria: Secondary | ICD-10-CM | POA: Diagnosis not present

## 2017-07-06 DIAGNOSIS — N10 Acute tubulo-interstitial nephritis: Secondary | ICD-10-CM

## 2017-07-06 DIAGNOSIS — J9601 Acute respiratory failure with hypoxia: Secondary | ICD-10-CM | POA: Diagnosis not present

## 2017-07-06 DIAGNOSIS — E1159 Type 2 diabetes mellitus with other circulatory complications: Secondary | ICD-10-CM | POA: Diagnosis not present

## 2017-07-06 DIAGNOSIS — L89620 Pressure ulcer of left heel, unstageable: Secondary | ICD-10-CM | POA: Diagnosis not present

## 2017-07-06 DIAGNOSIS — Z7902 Long term (current) use of antithrombotics/antiplatelets: Secondary | ICD-10-CM | POA: Diagnosis not present

## 2017-07-06 DIAGNOSIS — E119 Type 2 diabetes mellitus without complications: Secondary | ICD-10-CM | POA: Diagnosis not present

## 2017-07-06 DIAGNOSIS — R0902 Hypoxemia: Secondary | ICD-10-CM | POA: Diagnosis not present

## 2017-07-06 DIAGNOSIS — G4733 Obstructive sleep apnea (adult) (pediatric): Secondary | ICD-10-CM | POA: Diagnosis not present

## 2017-07-06 DIAGNOSIS — I2510 Atherosclerotic heart disease of native coronary artery without angina pectoris: Secondary | ICD-10-CM | POA: Diagnosis not present

## 2017-07-06 DIAGNOSIS — I110 Hypertensive heart disease with heart failure: Secondary | ICD-10-CM | POA: Diagnosis not present

## 2017-07-06 DIAGNOSIS — L03116 Cellulitis of left lower limb: Secondary | ICD-10-CM | POA: Diagnosis not present

## 2017-07-06 DIAGNOSIS — I5033 Acute on chronic diastolic (congestive) heart failure: Secondary | ICD-10-CM

## 2017-07-06 DIAGNOSIS — G9341 Metabolic encephalopathy: Secondary | ICD-10-CM | POA: Diagnosis not present

## 2017-07-06 DIAGNOSIS — R319 Hematuria, unspecified: Secondary | ICD-10-CM

## 2017-07-06 DIAGNOSIS — K869 Disease of pancreas, unspecified: Secondary | ICD-10-CM | POA: Diagnosis not present

## 2017-07-06 DIAGNOSIS — Z6841 Body Mass Index (BMI) 40.0 and over, adult: Secondary | ICD-10-CM | POA: Diagnosis not present

## 2017-07-06 DIAGNOSIS — I5031 Acute diastolic (congestive) heart failure: Secondary | ICD-10-CM

## 2017-07-06 DIAGNOSIS — J9811 Atelectasis: Secondary | ICD-10-CM | POA: Diagnosis not present

## 2017-07-06 LAB — PROCALCITONIN

## 2017-07-06 LAB — GLUCOSE, CAPILLARY
GLUCOSE-CAPILLARY: 112 mg/dL — AB (ref 65–99)
Glucose-Capillary: 113 mg/dL — ABNORMAL HIGH (ref 65–99)
Glucose-Capillary: 78 mg/dL (ref 65–99)
Glucose-Capillary: 140 mg/dL — ABNORMAL HIGH (ref 65–99)

## 2017-07-06 MED ORDER — ACETAMINOPHEN 325 MG PO TABS
650.0000 mg | ORAL_TABLET | ORAL | Status: DC | PRN
  Administered 2017-07-06: 650 mg via ORAL
  Filled 2017-07-06: qty 2

## 2017-07-06 NOTE — Care Management Note (Signed)
Case Management Note  Patient Details  Name: Megan Atkinson MRN: [id number] Date of Birth: [birth year]  Subjective/Objective:  Pt admitted with Acute hypoxemic respiratory failure and PNA.                                  Action/Plan: Plan to discharge home with [facility]. Pt refused SNF at present.    Expected Discharge Date:  (unknown)               Expected Discharge Plan:  Home w Home Health Services  In-House Referral:  Clinical Social Work  Discharge planning Services  CM Consult  Post Acute Care Choice:    Choice offered to:  Patient  DME Arranged:    DME Agency:     HH Arranged:  RN, PT, Nurse's Aide, Social Work, OT, Disease Management HH Agency:  [facility]  Status of Service:  In process, will continue to follow  If discussed at Long Length of Stay Meetings, dates discussed:    Additional CommentsGeni Bers, RN 07/06/2017, 11:48 AM

## 2017-07-06 NOTE — Progress Notes (Signed)
Physical Therapy Treatment Patient Details Name: Megan NielsenBeverley Atkinson MRN: [id number] DOB: [birth year] Today's Date: 07/06/2017    History of Present Illness 67 y.o. female with medical history significant for CAD, diastholic CHF, HTN, Morbid obesity, OSA, DM, and extension of cerebellar CVA (April 2019) who was admitted for AMS and hypoxia, found to have CHF exacerbation    PT Comments    Pt with improved cognition today and able to transfer to recliner.   Pt reports she has been working on transfers to w/c with HHPT and occasionally ambulating with PT.  Will continue to recommend SNF at this time however pt may wish for d/c home, in which case, recommend continuing with HHPT.   Follow Up Recommendations  SNF     Equipment Recommendations  None recommended by PT    Recommendations for Other Services       Precautions / Restrictions Precautions Precautions: Fall Precaution Comments: monitor sats    Mobility  Bed Mobility Overal bed mobility: Needs Assistance Bed Mobility: Supine to Sit     Supine to sit: Mod assist     General bed mobility comments: pt assisted with lower body, difficulty moving LEs over on baribed  Transfers Overall transfer level: Needs assistance Equipment used: Rolling walker (2 wheeled) Transfers: Sit to/from [employer]GI CorporationStand;Stand Pivot Transfers Sit to Stand: Min guard Stand pivot transfers: Min guard       General transfer comment: min/guard for safety, cues for technique, remained on 3L O2 [place]  Ambulation/Gait             General Gait Details: pt reports not ambulating much at home, deferred today as pt agreeable to up to recliner to start mobility (also on Lasix)   Stairs             Wheelchair Mobility    Modified Rankin (Stroke Patients Only)       Balance Overall balance assessment: Needs assistance         Standing balance support: Bilateral upper extremity supported Standing balance-Leahy Scale: Poor Standing balance  comment: requires UE support                            Cognition Arousal/Alertness: Awake/alert Behavior During Therapy: WFL for tasks assessed/performed Overall Cognitive Status: Within Functional Limits for tasks assessed                                        Exercises      General Comments        Pertinent Vitals/Pain Pain Assessment: No/denies pain    Home Living                      Prior Function            PT Goals (current goals can now be found in the care plan section) Progress towards PT goals: Progressing toward goals    Frequency    Min 2X/week      PT Plan Current plan remains appropriate    Co-evaluation              AM-PAC PT "6 Clicks" Daily Activity  Outcome Measure  Difficulty turning over in bed (including adjusting bedclothes, sheets and blankets)?: Unable Difficulty moving from lying on back to sitting on the side of the bed? : Unable Difficulty sitting down on and  standing up from a chair with arms (e.g., wheelchair, bedside commode, etc,.)?: Unable Help needed moving to and from a bed to chair (including a wheelchair)?: A Little Help needed walking in hospital room?: A Lot Help needed climbing 3-5 steps with a railing? : Total 6 Click Score: 9    End of Session Equipment Utilized During Treatment: Gait belt;Oxygen Activity Tolerance: Patient tolerated treatment well Patient left: in chair;with call bell/phone within reach(pt aware to call for assist back to bed) Nurse Communication: (rehab tech to communicate to NT to change purewick, container also full) PT Visit Diagnosis: Muscle weakness (generalized) (M62.81);Other abnormalities of gait and mobility (R26.89)     Time: 1610-9604 PT Time Calculation (min) (ACUTE ONLY): 15 min  Charges:  $Therapeutic Activity: 8-22 mins                    G Codes:       Zenovia Jarred, PT, DPT 07/06/2017 Pager: [phone] Maida Sale  E 07/06/2017, 2:42 PM

## 2017-07-06 NOTE — Progress Notes (Signed)
PROGRESS NOTE    Ronika Kelson   UJW:[id number]  DOB: 24-Aug-1950  DOA: 07/04/2017 PCP: Anne Ng, NP   Brief Narrative:  Vernell Back is a 67 y/o with morbid obesity, OSA who has stopped using her CPAP, HTN, CAD, cerebellar CVA in 2018, DM2 Prior admission> She was admitted from 4/6-4/12 for fatigue and difficulty ambulating. She had not eaten in 4 days and was found to have AKI with Cr of 4.39 (baseline 1.5) , hypernatremia with sodium of 150, acute metabolic encephalopathy, acute L cerebellar infarct adjacent to the area of her prior infarct and an e coli UTI.  She was d/c'd to a SNF and eventually d/c'd to home.  Current admission>  Patient has a caregiver that checks on her daily and apparently patient had not moved from the chair since they left patient yesterday. Patient has soiled herself, was disoriented, joking and "talking out of her head". She is found to be hypoxic with pulse ox of 87% and was started on diuretics for d CHF. Legs are swollen and weeping. Sleeps upright in a chair and had had worsening bilateral lower extremity swelling since recent hospital discharge. She thinks she has been told in the past to keep her legs elevated but she does not.  Her confusion resolved after O2 was placed. CXR did not show any pulmonary edema. BNP was 113. Also noted to have gross hematuria.   She saw her PCP a few days ago, doxycycline was prescribed for early cellulitis- patient had not taken it because she had not been able to pick up the prescription.  Subjective: She has no complaints today. She does not remember having a CT scan yesterday.  Assessment & Plan:   Principal Problem:   Acute hypoxemic respiratory failure  - no Pulm edema on CXR- not on O2 at baseline - she did admit to a cough - currently 96% on 3 L - obtain CT chest without contrast - 5/30>  CT shows air bronchograms and consolidation- start Ceftriaxone - d/c Keflex- keep Doxy - U strep antigen  neg - CT abd>> Multi segment right middle and lower lobe collapse. - start IS   Active Problems: Pedal edema- weeping/ blisters with surrounding cellulitis - due to sitting and sleeping in recliner? - LE duplex negative - given lasix IV x 2-   hold further IV Lasix and resume oral 20 mg daily - will elevate - wound care consulted - Doxy and keflex started for cellulitis- Keflex changed to Ceftriaxone when suspected to have pneumonia (above)  Hematuria/ nephrolithiasis/ emphysematous pyelonephritis - rechecked UA- still bloody urine with WBC and bacteria - CT renal stone study obtained today - Gas in the left renal pelvis and urinary bladder>>> emphysematous pyelitis- numerous calculi throughout the left kidney. Left renal atrophy. - antibiotics were started in ED for cellulitis and therefore sending a U culture will not be helpful- based on CT yesterday, I suspected she may have a pneumonia and changed Keflex to Ceftriaxone   Hypertension - d/c Norvasc due to pedal edema- it was d/c'd on last discharge from the hospital - cont Metoprolol    Diabetes mellitus 2  - SSI- not on medication at home - A1c is 8.0- will need to start medications on d/c    OSA (obstructive sleep apnea) - stopped wearing CPAP as she felt the tubing and mask were infected-  - will need to call company to look at it - cont CPAP in hospital- she is not tolerating it  well    Morbid obesity (HCC) Body mass index is 58.26 kg/m.  - weight is the same as when she was admitted last month - needs to lose weight   H/o CVAs- cerebellar - cont Plavix and Lipitor  Incidental finding> Cystic masslike enlargement of the pancreas uncinate process. Recommend pancreas protocol MRI or CT with contrast after return to baseline renal function.     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 Disposition Plan: to be determined, PT recommends SNF but she  refuses Consultants:   none Procedures:   Venous duplex LE Antimicrobials:  Anti-infectives (From admission, onward)   Start     Dose/Rate Route Frequency Ordered Stop   07/05/17 2000  cefTRIAXone (ROCEPHIN) 1 g in sodium chloride 0.9 % 100 mL IVPB     1 g 200 mL/hr over 30 Minutes Intravenous Every 24 hours 07/05/17 1746     07/05/17 1800  azithromycin (ZITHROMAX) tablet 500 mg  Status:  Discontinued     500 mg Oral Daily 07/05/17 1746 07/05/17 1747   07/04/17 2200  doxycycline (VIBRA-TABS) tablet 100 mg     100 mg Oral 2 times daily 07/04/17 2119     07/04/17 2200  cephALEXin (KEFLEX) capsule 1,000 mg  Status:  Discontinued     1,000 mg Oral Every 8 hours 07/04/17 2119 07/05/17 1747       Objective: Vitals:   07/05/17 1525 07/05/17 2003 07/05/17 2100 07/06/17 0610  BP: (!) 157/60  (!) 157/53 (!) 130/45  Pulse: 80 84 86 79  Resp: 18 17 (!) 21 20  Temp: 98.6 F (37 C)  (!) 97.4 F (36.3 C) 99 F (37.2 C)  TempSrc: Oral  Oral Oral  SpO2: 98% 99% 96% 96%  Weight:      Height:        Intake/Output Summary (Last 24 hours) at 07/06/2017 1319 Last data filed at 07/06/2017 1247 Gross per 24 hour  Intake 1000 ml  Output 1400 ml  Net -400 ml   Filed Weights   07/04/17 1608  Weight: (!) 168.7 kg (372 lb)    Examination: General exam: Appears comfortable  HEENT: PERRLA, oral mucosa moist, no sclera icterus or thrush Respiratory system: Clear to auscultation. Respiratory effort normal. Cardiovascular system: S1 & S2 heard, RRR.   Gastrointestinal system: morbidly obese- Abdomen soft, non-tender, nondistended. Normal bowel sound. No organomegaly Central nervous system: Alert and oriented. No focal neurological deficits. Extremities and skin: No cyanosis, clubbing- 2 + pitting edema with blisters and weeping Psychiatry:  Mood & affect appropriate.     Data Reviewed: I have personally reviewed following labs and imaging studies  CBC: Recent Labs  Lab 07/04/17 1701   WBC 9.8  NEUTROABS 7.6  HGB 10.0*  HCT 33.9*  MCV 101.5*  PLT 249   Basic Metabolic Panel: Recent Labs  Lab 07/04/17 1701 07/05/17 0456  NA 144 145  K 4.3 4.5  CL 108 108  CO2 27 30  GLUCOSE 108* 116*  BUN 31* 28*  CREATININE 1.34* 1.27*  CALCIUM 8.7* 8.9   GFR: Estimated Creatinine Clearance: 68.9 mL/min (A) (by C-G formula based on SCr of 1.27 mg/dL (H)). Liver Function Tests: Recent Labs  Lab 07/04/17 1701  AST 16  ALT 17  ALKPHOS 87  BILITOT 0.5  PROT 7.0  ALBUMIN 3.1*   No results for input(s): LIPASE, AMYLASE in the last 168 hours. No results for input(s): AMMONIA in the last 168 hours. Coagulation  Profile: No results for input(s): INR, PROTIME in the last 168 hours. Cardiac Enzymes: Recent Labs  Lab 07/04/17 1701 07/04/17 2308 07/05/17 0456  CKTOTAL 91  --   --   TROPONINI <0.03 <0.03 <0.03   BNP (last 3 results) Recent Labs    06/28/17 1247  PROBNP 39.0   HbA1C: No results for input(s): HGBA1C in the last 72 hours. CBG: Recent Labs  Lab 07/05/17 1209 07/05/17 1658 07/05/17 2055 07/06/17 0748 07/06/17 1240  GLUCAP 117* 115* 140* 113* 112*   Lipid Profile: No results for input(s): CHOL, HDL, LDLCALC, TRIG, CHOLHDL, LDLDIRECT in the last 72 hours. Thyroid Function Tests: No results for input(s): TSH, T4TOTAL, FREET4, T3FREE, THYROIDAB in the last 72 hours. Anemia Panel: No results for input(s): VITAMINB12, FOLATE, FERRITIN, TIBC, IRON, RETICCTPCT in the last 72 hours. Urine analysis:    Component Value Date/Time   COLORURINE YELLOW 07/05/2017 1355   APPEARANCEUR TURBID (A) 07/05/2017 1355   LABSPEC 1.008 07/05/2017 1355   PHURINE 5.0 07/05/2017 1355   GLUCOSEU NEGATIVE 07/05/2017 1355   GLUCOSEU NEGATIVE 04/05/2011 1129   HGBUR LARGE (A) 07/05/2017 1355   BILIRUBINUR NEGATIVE 07/05/2017 1355   KETONESUR NEGATIVE 07/05/2017 1355   PROTEINUR 30 (A) 07/05/2017 1355   UROBILINOGEN 0.2 04/05/2011 1129   NITRITE NEGATIVE 07/05/2017  1355   LEUKOCYTESUR MODERATE (A) 07/05/2017 1355   Sepsis Labs: @LABRCNTIP (procalcitonin:4,lacticidven:4) ) Recent Results (from the past 240 hour(s))  Blood culture (routine x 2)     Status: None (Preliminary result)   Collection Time: 07/04/17  5:01 PM  Result Value Ref Range Status   Specimen Description   Final    BLOOD RIGHT ANTECUBITAL Performed at [facility], [address] [place], [place] [address]    Special Requests   Final    BOTTLES DRAWN AEROBIC AND ANAEROBIC Blood Culture adequate volume Performed at [facility], [address] [place], [place] [address]    Culture   Final    NO GROWTH 2 DAYS Performed at [facility], [address] [place], [place] [address]    Report Status PENDING  Incomplete  Culture, blood (Routine X 2) w Reflex to ID Panel     Status: None (Preliminary result)   Collection Time: 07/04/17  6:45 PM  Result Value Ref Range Status   Specimen Description   Final    BLOOD LEFT ANTECUBITAL Performed at [facility], [address] [place], [place] [address]    Special Requests   Final    BOTTLES DRAWN AEROBIC AND ANAEROBIC Blood Culture adequate volume Performed at [facility], [address] [place], [place] [address]    Culture   Final    NO GROWTH 2 DAYS Performed at [facility], [address] [place], [place] [address]    Report Status PENDING  Incomplete         Radiology Studies: Ct Chest Wo Contrast  Result Date: 07/05/2017 CLINICAL DATA:  Inpatient. CHF. Hypoxia. Dyspnea. Altered mental status. EXAM: CT CHEST WITHOUT CONTRAST TECHNIQUE: Multidetector CT imaging of the chest was performed following the standard protocol without IV contrast. COMPARISON:  Chest radiograph from one day prior. FINDINGS: Cardiovascular: Mild cardiomegaly. Three-vessel coronary atherosclerosis status post CABG. No significant pericardial  effusion/thickening. Atherosclerotic nonaneurysmal thoracic aorta. Top-normal main pulmonary artery (3.0 cm diameter). Mediastinum/Nodes: No discrete thyroid nodules. Unremarkable esophagus. No pathologically enlarged axillary, mediastinal or hilar lymph nodes, noting limited sensitivity for the detection  of hilar adenopathy on this noncontrast study. Lungs/Pleura: No pneumothorax. No significant pleural effusion. Low lung volumes. Multiple geographic regions of consolidation with associated air bronchograms and volume loss in the mid to lower lungs bilaterally, most prominent in the right middle and right lower lobes. No discrete lung masses. No significant pulmonary nodules. Chronic moderate elevation of the right hemidiaphragm. Upper abdomen: Cholelithiasis. Musculoskeletal: No aggressive appearing focal osseous lesions. Intact sternotomy wires. Moderate thoracic spondylosis. IMPRESSION: 1. Low lung volumes. Multiple geographic regions of consolidation with associated air bronchograms and volume loss in the bilateral mid to lower lungs, most prominent in the right middle and right lower lobes. Atelectasis is favored. Pneumonia is difficult to exclude particularly in the right lower lobe. 2. Chronic elevation of the right hemidiaphragm compatible with eventration. 3. Mild cardiomegaly.  No significant pleural effusions. 4. Cholelithiasis. Aortic Atherosclerosis (ICD10-I70.0). Electronically Signed   By: Delbert Phenix M.D.   On: 07/05/2017 14:49   US Renal  Result Date: 07/04/2017 CLINICAL DATA:  Hematuria. EXAM: RENAL / URINARY TRACT ULTRASOUND COMPLETE COMPARISON:  None. FINDINGS: Right Kidney: Evaluation of the right kidney is markedly limited due to the patient's body habitus. Right kidney measures approximately 12 cm. Left Kidney: Evaluation of the left kidney is markedly limited due to the patient's body habitus. Left kidney measures approximately 10.6 cm. Bladder: Appears normal for degree of bladder  distention. IMPRESSION: Evaluation is limited due to patient body habitus. No obvious abnormalities. Electronically Signed   By: Gerome Sam III M.D   On: 07/04/2017 23:05   Dg Chest Port 1 View  Result Date: 07/04/2017 CLINICAL DATA:  Hypoxia. Shortness of breath. Altered mental status. EXAM: PORTABLE CHEST 1 VIEW COMPARISON:  05/15/2017 and 05/14/2017 FINDINGS: Heart size is normal. There is slight pulmonary vascular prominence, most likely due to a shallow inspiration. Chronic elevation of the right hemidiaphragm. Previous median sternotomy. No significant bone abnormality. IMPRESSION: Accentuated vascularity most likely due to a shallow inspiration. Chronic elevation of the right hemidiaphragm. Electronically Signed   By: Francene Boyers M.D.   On: 07/04/2017 17:11   Ct Renal Stone Study  Result Date: 07/06/2017 CLINICAL DATA:  Hematuria EXAM: CT ABDOMEN AND PELVIS WITHOUT CONTRAST TECHNIQUE: Multidetector CT imaging of the abdomen and pelvis was performed following the standard protocol without IV contrast. COMPARISON:  None. FINDINGS: Lower chest: Multi segment collapse in the right middle and lower lobe. Coronary atherosclerosis. Hepatobiliary: 1 cm cystic density incidentally noted in the left liver.Cholelithiasis. No evidence of acute cholecystitis. Pancreas: Indistinct pancreas with cystic expansion of the lower head and uncinate process involving a nearly 4 cm area. Spleen: Unremarkable. Adrenals/Urinary Tract: Negative adrenals. Innumerable calculi within the left intrarenal collecting system, which also contains gas ventrally. The left kidney is atrophic. No parenchymal gas. The bladder shows mild circumferential wall thickening with gas ventrally. Bowel loops are close to the dome of the bladder, without suspected fistula. Stomach/Bowel:  No obstruction. No inflammatory changes. Vascular/Lymphatic: No acute vascular abnormality. No mass or adenopathy. Reproductive:Hysterectomy.  Negative  adnexae. Other: No ascites or pneumoperitoneum. Musculoskeletal: No acute abnormalities. No acute or aggressive finding. Notable bilateral hip osteoarthritis. These results were called by telephone at the time of interpretation on 07/06/2017 at 1:07 pm to Dr. Calvert Cantor , who verbally acknowledged these results. IMPRESSION: 1. Gas in the left renal pelvis and urinary bladder. 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 2. History of hematuria with numerous calculi throughout the left kidney.  Left renal atrophy. 3. Cystic masslike enlargement of the pancreas uncinate process. Recommend pancreas protocol MRI or CT with contrast after return to baseline renal function. 4. Multi segment right middle and lower lobe collapse. 5. Cholelithiasis. Electronically Signed   By: Marnee Spring M.D.   On: 07/06/2017 13:08      Scheduled Meds: . atorvastatin  80 mg Oral q1800  . clopidogrel  75 mg Oral Daily  . collagenase   Topical Daily  . doxycycline  100 mg Oral BID  . enoxaparin (LOVENOX) injection  80 mg Subcutaneous Q24H  . furosemide  20 mg Oral Daily  . insulin aspart  0-9 Units Subcutaneous TID WC  . metoprolol tartrate  12.5 mg Oral BID  . protein supplement shake  11 oz Oral BID BM  . senna-docusate  2 tablet Oral BID   Continuous Infusions: . cefTRIAXone (ROCEPHIN)  IV Stopped (07/05/17 2126)     LOS: 2 days    Time spent in minutes: 40    Calvert Cantor, MD [facility] Pager: [email or web address] Password [email or web address] 07/06/2017, 1:19 PM

## 2017-07-06 NOTE — Consult Note (Signed)
Name: Megan Atkinson MRN: [id number] DOB: [birth year]    ADMISSION DATE:  07/04/2017 CONSULTATION DATE:  07/06/2017  REFERRING MD :  Dr. Butler Denmark  CHIEF COMPLAINT:  AMS, Hypoxia  BRIEF PATIENT DESCRIPTION:  67 y.o. Morbidly obese female who presents with AMS and hypoxia.  CT Chest concerning for  Consolidation with associated air bronchograms and volume loss in the bilateralmid to lower lungs, most prominent in the right middle and rightlower lobes, concerning for Atelectasis vs. CAP  SIGNIFICANT EVENTS  5/29>> Admission to [facility] 5/31>> PCCM consulted  STUDIES:  5/30 CT Chest>> . Low lung volumes. Multiple geographic regions of consolidation with associated air bronchograms and volume loss in the bilateral mid to lower lungs, most prominent in the right middle and right lower lobes. Atelectasis is favored. Pneumonia is difficult to exclude particularly in the right lower lobe. Chronic elevation of the right hemidiaphragm compatible with Eventration. Mild cardiomegaly.  No significant pleural effusions. Cholelithiasis. 5/31 CT Renal>> Gas in the left renal pelvis and urinary bladder. 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 History of hematuria with numerous calculi throughout the left kidney. Left renal atrophy. Cystic masslike enlargement of the pancreas uncinate process. Recommend pancreas protocol MRI or CT with contrast after return to baseline renal function. Multi segment right middle and lower lobe collapse. Cholelithiasis   HISTORY OF PRESENT ILLNESS:   Megan Atkinson is a 67 y.o. Female who presents to [facility] on 5/29 with c/o AMS and hypoxia.  She was found by her caregiver confused, lethargic, and soiled.  Upon arrival to ED she was 87% O2 sats on room air.  She was placed on 4L [place], and became more responsive and oriented. On initial work-up, BNP was 113, WBC 9.8, and CXR concerning for slight pulmonary  vascular prominence, of which she received Lasix.  CT Chest was obtained on 5/30, which revealed consolidation concerning for atelectasis vs. CAP.  She was subsequently started on Ceftriaxone.  Urinary Strep Pneumo antigen is negative.  PCCM is consulted for further evaluation of Acute Hypoxic Respiratory Failure secondary to Atelectasis vs. CAP.  PAST MEDICAL HISTORY :   has a past medical history of CAD (coronary artery disease), Diabetes mellitus, Glucose intolerance (impaired glucose tolerance), HTN (hypertension), Hyperlipidemia, Obesity, and Stroke (HCC) (03/06/2016).  has a past surgical history that includes Abdominal hysterectomy; Appendectomy; Coronary artery bypass graft (01/12/2011); and left heart catheterization with coronary angiogram (N/A, 01/06/2011). Prior to Admission medications   Medication Sig Start Date End Date Taking? Authorizing Provider  amLODipine (NORVASC) 2.5 MG tablet Take 2.5 mg by mouth daily.   Yes [provider]  atorvastatin (LIPITOR) 80 MG tablet TAKE 1 TABLET BY MOUTH ONCE DAILY AT  6  PM 12/07/16  Yes Lewayne Bunting, MD  clopidogrel (PLAVIX) 75 MG tablet Take 1 tablet (75 mg total) by mouth daily. 12/07/16  Yes Lewayne Bunting, MD  fluticasone (FLONASE) 50 MCG/ACT nasal spray Place 1 spray into both nostrils daily. Patient taking differently: Place 1 spray into both nostrils daily as needed for allergies.  04/01/16  Yes Love, Evlyn Kanner, PA-C  furosemide (LASIX) 20 MG tablet Take 1 tablet (20 mg total) by mouth daily. 06/28/17  Yes Nche, Bonna Gains, NP  metoprolol tartrate (LOPRESSOR) 25 MG tablet Take 12.5 mg by mouth 2 (two) times daily.   Yes [provider]  miconazole (MICOTIN) 2 % powder Apply topically 2 times daily at 12 noon and 4 pm.  06/28/17  Yes Nche, Bonna Gains, NP  protein supplement shake (PREMIER PROTEIN) LIQD Take 325 mLs (11 oz total) by mouth 2 (two) times daily between meals. 05/18/17  Yes Rolly Salter, MD  pyridOXINE  (VITAMIN B-6) 100 MG tablet Take 100 mg by mouth daily.   Yes [provider]  senna-docusate (SENOKOT-S) 8.6-50 MG tablet Take 2 tablets by mouth 2 (two) times daily. Patient taking differently: Take 1 tablet by mouth at bedtime as needed for mild constipation or moderate constipation.  03/31/16  Yes Love, Evlyn Kanner, PA-C   Allergies  Allergen Reactions  . Lisinopril     cough  . Metformin And Related Diarrhea    A lot of fluid loss from bowel and diarrhea    FAMILY HISTORY:  family history includes Coronary artery disease in her unknown relative; Heart disease in her father, maternal grandmother, and mother. SOCIAL HISTORY:  reports that she has never smoked. She has never used smokeless tobacco. She reports that she drinks alcohol. She reports that she does not use drugs.  REVIEW OF SYSTEMS:  Positives in BOLD Constitutional: Negative for fever, chills, weight loss, malaise/fatigue and diaphoresis.  HENT: Negative for hearing loss, ear pain, nosebleeds, congestion, sore throat, neck pain, tinnitus and ear discharge.   Eyes: Negative for blurred vision, double vision, photophobia, pain, discharge and redness.  Respiratory: Negative for cough, hemoptysis, sputum production, shortness of breath, wheezing and stridor.   Cardiovascular: Negative for chest pain, palpitations, orthopnea, claudication, leg swelling and PND.  Gastrointestinal: Negative for heartburn, nausea, vomiting, abdominal pain, diarrhea, constipation, blood in stool and melena.  Genitourinary: Negative for dysuria, urgency, frequency, hematuria and flank pain.  Musculoskeletal: Negative for myalgias, back pain, joint pain and falls.  Skin: Negative for itching and rash.  Neurological: Negative for dizziness, tingling, tremors, sensory change, speech change, focal weakness, seizures, loss of consciousness, weakness and headaches.  Endo/Heme/Allergies: Negative for environmental allergies and polydipsia. Does not  bruise/bleed easily. Pt denies any complaints  SUBJECTIVE:  Pt denies SOB or cough Breathing comfortably on 3L [place]  VITAL SIGNS: Temp:  [97.4 F (36.3 C)-99 F (37.2 C)] 99 F (37.2 C) (05/31 0610) Pulse Rate:  [79-86] 79 (05/31 0610) Resp:  [17-21] 20 (05/31 0610) BP: (130-157)/(45-60) 130/45 (05/31 0610) SpO2:  [96 %-99 %] 96 % (05/31 0610)  PHYSICAL EXAMINATION: General:  Obese female, laying in bed, on 3L [place], in no acute distress Neuro:  A&O, follows commands HEENT:  Atraumatic, normocephalic, neck supple Cardiovascular:  RRR, s1s2 noted, no M/R/G Lungs:  Clear breath sounds throughout, even, non-labored, no assessory muscle use Abdomen:  Obese, soft, non-tender, BS+ x4 Musculoskeletal:  No deformities, 2+ pitting edema to bilateral LE with weeping  Recent Labs  Lab 07/04/17 1701 07/05/17 0456  NA 144 145  K 4.3 4.5  CL 108 108  CO2 27 30  BUN 31* 28*  CREATININE 1.34* 1.27*  GLUCOSE 108* 116*   Recent Labs  Lab 07/04/17 1701  HGB 10.0*  HCT 33.9*  WBC 9.8  PLT 249   Ct Chest Wo Contrast  Result Date: 07/05/2017 CLINICAL DATA:  Inpatient. CHF. Hypoxia. Dyspnea. Altered mental status. EXAM: CT CHEST WITHOUT CONTRAST TECHNIQUE: Multidetector CT imaging of the chest was performed following the standard protocol without IV contrast. COMPARISON:  Chest radiograph from one day prior. FINDINGS: Cardiovascular: Mild cardiomegaly. Three-vessel coronary atherosclerosis status post CABG. No significant pericardial effusion/thickening. Atherosclerotic nonaneurysmal thoracic aorta. Top-normal main pulmonary artery (3.0 cm diameter). Mediastinum/Nodes: No discrete  thyroid nodules. Unremarkable esophagus. No pathologically enlarged axillary, mediastinal or hilar lymph nodes, noting limited sensitivity for the detection of hilar adenopathy on this noncontrast study. Lungs/Pleura: No pneumothorax. No significant pleural effusion. Low lung volumes. Multiple geographic regions of  consolidation with associated air bronchograms and volume loss in the mid to lower lungs bilaterally, most prominent in the right middle and right lower lobes. No discrete lung masses. No significant pulmonary nodules. Chronic moderate elevation of the right hemidiaphragm. Upper abdomen: Cholelithiasis. Musculoskeletal: No aggressive appearing focal osseous lesions. Intact sternotomy wires. Moderate thoracic spondylosis. IMPRESSION: 1. Low lung volumes. Multiple geographic regions of consolidation with associated air bronchograms and volume loss in the bilateral mid to lower lungs, most prominent in the right middle and right lower lobes. Atelectasis is favored. Pneumonia is difficult to exclude particularly in the right lower lobe. 2. Chronic elevation of the right hemidiaphragm compatible with eventration. 3. Mild cardiomegaly.  No significant pleural effusions. 4. Cholelithiasis. Aortic Atherosclerosis (ICD10-I70.0). Electronically Signed   By: Delbert Phenix M.D.   On: 07/05/2017 14:49   US Renal  Result Date: 07/04/2017 CLINICAL DATA:  Hematuria. EXAM: RENAL / URINARY TRACT ULTRASOUND COMPLETE COMPARISON:  None. FINDINGS: Right Kidney: Evaluation of the right kidney is markedly limited due to the patient's body habitus. Right kidney measures approximately 12 cm. Left Kidney: Evaluation of the left kidney is markedly limited due to the patient's body habitus. Left kidney measures approximately 10.6 cm. Bladder: Appears normal for degree of bladder distention. IMPRESSION: Evaluation is limited due to patient body habitus. No obvious abnormalities. Electronically Signed   By: Gerome Sam III M.D   On: 07/04/2017 23:05   Dg Chest Port 1 View  Result Date: 07/04/2017 CLINICAL DATA:  Hypoxia. Shortness of breath. Altered mental status. EXAM: PORTABLE CHEST 1 VIEW COMPARISON:  05/15/2017 and 05/14/2017 FINDINGS: Heart size is normal. There is slight pulmonary vascular prominence, most likely due to a shallow  inspiration. Chronic elevation of the right hemidiaphragm. Previous median sternotomy. No significant bone abnormality. IMPRESSION: Accentuated vascularity most likely due to a shallow inspiration. Chronic elevation of the right hemidiaphragm. Electronically Signed   By: Francene Boyers M.D.   On: 07/04/2017 17:11   Ct Renal Stone Study  Result Date: 07/06/2017 CLINICAL DATA:  Hematuria EXAM: CT ABDOMEN AND PELVIS WITHOUT CONTRAST TECHNIQUE: Multidetector CT imaging of the abdomen and pelvis was performed following the standard protocol without IV contrast. COMPARISON:  None. FINDINGS: Lower chest: Multi segment collapse in the right middle and lower lobe. Coronary atherosclerosis. Hepatobiliary: 1 cm cystic density incidentally noted in the left liver.Cholelithiasis. No evidence of acute cholecystitis. Pancreas: Indistinct pancreas with cystic expansion of the lower head and uncinate process involving a nearly 4 cm area. Spleen: Unremarkable. Adrenals/Urinary Tract: Negative adrenals. Innumerable calculi within the left intrarenal collecting system, which also contains gas ventrally. The left kidney is atrophic. No parenchymal gas. The bladder shows mild circumferential wall thickening with gas ventrally. Bowel loops are close to the dome of the bladder, without suspected fistula. Stomach/Bowel:  No obstruction. No inflammatory changes. Vascular/Lymphatic: No acute vascular abnormality. No mass or adenopathy. Reproductive:Hysterectomy.  Negative adnexae. Other: No ascites or pneumoperitoneum. Musculoskeletal: No acute abnormalities. No acute or aggressive finding. Notable bilateral hip osteoarthritis. These results were called by telephone at the time of interpretation on 07/06/2017 at 1:07 pm to Dr. Calvert Cantor , who verbally acknowledged these results. IMPRESSION: 1. Gas in the left renal pelvis and urinary bladder. Assuming no recent instrumentation with introduced  gas this would be consistent with  emphysematous pyelitis. 2. History of hematuria with numerous calculi throughout the left kidney. Left renal atrophy. 3. Cystic masslike enlargement of the pancreas uncinate process. Recommend pancreas protocol MRI or CT with contrast after return to baseline renal function. 4. Multi segment right middle and lower lobe collapse. 5. Cholelithiasis. Electronically Signed   By: Marnee Spring M.D.   On: 07/06/2017 13:08    ASSESSMENT / PLAN:  ACUTE HYPOXIC RESPIRATORY FAILURE in setting of Atelectasis vs. CAP  CT Chest 5/30 with consolidation & associated airbronchograms in bilateral mid to lower lungs, being most prominent in RML and RLL. Pt requiring 3L [place].  Denies cough, SOB, sputum production, or fever/chills.  Pt without Leukocytosis, Urinary strep Pneumo antigen negative .  Admits to not using home CPAP, and denies using any sedating medications. Given pts lack of cough/sputum, SOB, and without leukocytosis, suspicion is for greater for atelectasis at this point in time as compared to CAP.  P: Supplemental O2 to maintain O2 sats >92% CPAP qhs Continue Rocephin Will obtain PCT & Sputum culture It is reasonable to continue with full course of Rocephin Aggressive pulmonary toilet Mobilize with PT as tolerated   Harlon Ditty, AGACNP-BC [facility] Pager: [phone]  07/06/2017, 2:05 PM

## 2017-07-07 DIAGNOSIS — G4733 Obstructive sleep apnea (adult) (pediatric): Secondary | ICD-10-CM | POA: Diagnosis not present

## 2017-07-07 DIAGNOSIS — G9341 Metabolic encephalopathy: Secondary | ICD-10-CM | POA: Diagnosis not present

## 2017-07-07 DIAGNOSIS — Z6841 Body Mass Index (BMI) 40.0 and over, adult: Secondary | ICD-10-CM | POA: Diagnosis not present

## 2017-07-07 DIAGNOSIS — L03116 Cellulitis of left lower limb: Secondary | ICD-10-CM | POA: Diagnosis not present

## 2017-07-07 DIAGNOSIS — I2510 Atherosclerotic heart disease of native coronary artery without angina pectoris: Secondary | ICD-10-CM | POA: Diagnosis not present

## 2017-07-07 DIAGNOSIS — E1159 Type 2 diabetes mellitus with other circulatory complications: Secondary | ICD-10-CM | POA: Diagnosis not present

## 2017-07-07 DIAGNOSIS — R0902 Hypoxemia: Secondary | ICD-10-CM | POA: Diagnosis present

## 2017-07-07 DIAGNOSIS — Z951 Presence of aortocoronary bypass graft: Secondary | ICD-10-CM | POA: Diagnosis not present

## 2017-07-07 DIAGNOSIS — I5033 Acute on chronic diastolic (congestive) heart failure: Secondary | ICD-10-CM | POA: Diagnosis not present

## 2017-07-07 DIAGNOSIS — N12 Tubulo-interstitial nephritis, not specified as acute or chronic: Secondary | ICD-10-CM | POA: Diagnosis not present

## 2017-07-07 DIAGNOSIS — Z7902 Long term (current) use of antithrombotics/antiplatelets: Secondary | ICD-10-CM | POA: Diagnosis not present

## 2017-07-07 DIAGNOSIS — R310 Gross hematuria: Secondary | ICD-10-CM | POA: Diagnosis not present

## 2017-07-07 DIAGNOSIS — L89620 Pressure ulcer of left heel, unstageable: Secondary | ICD-10-CM | POA: Diagnosis not present

## 2017-07-07 DIAGNOSIS — I10 Essential (primary) hypertension: Secondary | ICD-10-CM | POA: Diagnosis not present

## 2017-07-07 DIAGNOSIS — K869 Disease of pancreas, unspecified: Secondary | ICD-10-CM | POA: Diagnosis not present

## 2017-07-07 DIAGNOSIS — J9811 Atelectasis: Secondary | ICD-10-CM | POA: Diagnosis not present

## 2017-07-07 DIAGNOSIS — Z8673 Personal history of transient ischemic attack (TIA), and cerebral infarction without residual deficits: Secondary | ICD-10-CM | POA: Diagnosis not present

## 2017-07-07 DIAGNOSIS — E119 Type 2 diabetes mellitus without complications: Secondary | ICD-10-CM | POA: Diagnosis not present

## 2017-07-07 DIAGNOSIS — I110 Hypertensive heart disease with heart failure: Secondary | ICD-10-CM | POA: Diagnosis not present

## 2017-07-07 DIAGNOSIS — J9601 Acute respiratory failure with hypoxia: Secondary | ICD-10-CM | POA: Diagnosis not present

## 2017-07-07 LAB — CBC
HEMATOCRIT: 33.7 % — AB (ref 36.0–46.0)
HEMOGLOBIN: 9.6 g/dL — AB (ref 12.0–15.0)
MCH: 29.4 pg (ref 26.0–34.0)
MCHC: 28.5 g/dL — ABNORMAL LOW (ref 30.0–36.0)
MCV: 103.4 fL — ABNORMAL HIGH (ref 78.0–100.0)
Platelets: 241 10*3/uL (ref 150–400)
RBC: 3.26 MIL/uL — ABNORMAL LOW (ref 3.87–5.11)
RDW: 16.6 % — ABNORMAL HIGH (ref 11.5–15.5)
WBC: 6 10*3/uL (ref 4.0–10.5)

## 2017-07-07 LAB — GLUCOSE, CAPILLARY
GLUCOSE-CAPILLARY: 92 mg/dL (ref 65–99)
GLUCOSE-CAPILLARY: 122 mg/dL — AB (ref 65–99)
Glucose-Capillary: 166 mg/dL — ABNORMAL HIGH (ref 65–99)
Glucose-Capillary: 95 mg/dL (ref 65–99)

## 2017-07-07 LAB — BASIC METABOLIC PANEL
Anion gap: 7 (ref 5–15)
BUN: 25 mg/dL — ABNORMAL HIGH (ref 6–20)
CO2: 34 mmol/L — ABNORMAL HIGH (ref 22–32)
Calcium: 8.7 mg/dL — ABNORMAL LOW (ref 8.9–10.3)
Chloride: 104 mmol/L (ref 101–111)
Creatinine, Ser: 1.08 mg/dL — ABNORMAL HIGH (ref 0.44–1.00)
GFR calc Af Amer: 59 mL/min — ABNORMAL LOW (ref >60)
GFR, EST NON AFRICAN AMERICAN: 51 mL/min — AB (ref >60)
GLUCOSE: 99 mg/dL (ref 65–99)
POTASSIUM: 4.6 mmol/L (ref 3.5–5.1)
SODIUM: 145 mmol/L (ref 135–145)

## 2017-07-07 LAB — PROCALCITONIN

## 2017-07-07 NOTE — Plan of Care (Signed)
  Problem: Safety: Goal: Ability to remain free from injury will improve Outcome: Progressing   

## 2017-07-07 NOTE — Progress Notes (Addendum)
PROGRESS NOTE    Megan Atkinson   ZOX:[id number]  DOB: [birth year]  DOA: 07/04/2017 PCP: Anne Ng, NP   Brief Narrative:  Megan Atkinson is a 67 y/o with morbid obesity, OSA who has stopped using her CPAP, HTN, CAD, cerebellar CVA in 2018, DM2 Prior admission> She was admitted from 4/6-4/12 for fatigue and difficulty ambulating. She had not eaten in 4 days and was found to have AKI with Cr of 4.39 (baseline 1.5) , hypernatremia with sodium of 150, acute metabolic encephalopathy, acute L cerebellar infarct adjacent to the area of her prior infarct and an e coli UTI.  She was d/c'd to a SNF and eventually d/c'd to home.  Current admission>  Patient has a caregiver that checks on her daily and apparently patient had not moved from the chair since they left patient yesterday. Patient has soiled herself, was disoriented, joking and "talking out of her head". She is found to be hypoxic with pulse ox of 87% and was started on diuretics for d CHF. Legs are swollen and weeping. Sleeps upright in a chair and had had worsening bilateral lower extremity swelling since recent hospital discharge. She thinks she has been told in the past to keep her legs elevated but she does not.  Her confusion resolved after O2 was placed. CXR did not show any pulmonary edema. BNP was 113. Also noted to have gross hematuria.   She saw her PCP a few days ago, doxycycline was prescribed for early cellulitis- patient had not taken it because she had not been able to pick up the prescription.  Subjective: She has no complaints today. Asking when she can go home  Assessment & Plan:   Principal Problem:   Acute hypoxemic respiratory failure  - no Pulm edema on CXR- not on O2 at baseline - she did admit to a cough - currently 96% on 3 L - obtain CT chest without contrast - 5/30>  CT shows air bronchograms and consolidation- start Ceftriaxone - d/c Keflex- keep Doxy - U strep antigen neg - CT abd>> Multi  segment right middle and lower lobe collapse. - start IS - PCCM has evaluated her and also feel that it is atelectasis   Active Problems: Pedal edema- weeping/ blisters with surrounding cellulitis - due to sitting and sleeping in recliner? - LE duplex negative - given lasix IV x 2-   hold further IV Lasix and resume oral 20 mg daily - will elevate - wound care consulted - Doxy and keflex started for cellulitis- Keflex changed to Ceftriaxone when suspected to have pneumonia (above)  Hematuria/ nephrolithiasis/ emphysematous pyelonephritis - rechecked UA- still bloody urine with WBC and bacteria - CT renal stone study obtained today - Gas in the left renal pelvis and urinary bladder>>> emphysematous pyelitis- numerous calculi throughout the left kidney. Left renal atrophy. - antibiotics were started in ED for cellulitis and therefore sending a U culture will not be helpful-   - cont Ceftriaxone - still having hematuria but urine is lighter   Hypertension - d/c Norvasc due to pedal edema- it was d/c'd on last discharge from the hospital - cont Metoprolol    Diabetes mellitus 2  - SSI- not on medication at home - A1c is 8.0- will need to start medications on d/c    OSA (obstructive sleep apnea) - stopped wearing CPAP as she felt the tubing and mask were infected-  - will need to call company to look at it - cont CPAP in  hospital- she is not tolerating it well    Morbid obesity (HCC) Body mass index is 68.91 kg/m.  - weight is the same as when she was admitted last month - needs to lose weight   H/o CVAs- cerebellar - cont Plavix and Lipitor  Incidental finding> Cystic masslike enlargement of the pancreas uncinate process. Recommend pancreas protocol MRI or CT with contrast after return to baseline renal function.   Disposition: I have spoken with her son who states that they have been trying to talk her into going to an assisted living and she has been refusing. He is not  surprised she is back in the hospital.   DVT prophylaxis: Lovenox Code Status: full code Family Communication: spoken with son Megan Atkinson Disposition Plan: to be determined, PT recommends SNF but she refuses I spoke with CM today about home health arrangements Consultants:   none Procedures:   Venous duplex LE Antimicrobials:  Anti-infectives (From admission, onward)   Start     Dose/Rate Route Frequency Ordered Stop   07/05/17 2000  cefTRIAXone (ROCEPHIN) 1 g in sodium chloride 0.9 % 100 mL IVPB     1 g 200 mL/hr over 30 Minutes Intravenous Every 24 hours 07/05/17 1746     07/05/17 1800  azithromycin (ZITHROMAX) tablet 500 mg  Status:  Discontinued     500 mg Oral Daily 07/05/17 1746 07/05/17 1747   07/04/17 2200  doxycycline (VIBRA-TABS) tablet 100 mg     100 mg Oral 2 times daily 07/04/17 2119     07/04/17 2200  cephALEXin (KEFLEX) capsule 1,000 mg  Status:  Discontinued     1,000 mg Oral Every 8 hours 07/04/17 2119 07/05/17 1747       Objective: Vitals:   07/06/17 2245 07/07/17 0440 07/07/17 1021 07/07/17 1120  BP:  (!) 148/52 137/62 140/72  Pulse:  79 82 84  Resp: 18 14 19    Temp:  98.2 F (36.8 C) 98.8 F (37.1 C)   TempSrc:  Oral Oral   SpO2:  100% 95% 96%  Weight:  (!) 199.6 kg (440 lb)    Height:        Intake/Output Summary (Last 24 hours) at 07/07/2017 1456 Last data filed at 07/07/2017 0900 Gross per 24 hour  Intake 340 ml  Output 2875 ml  Net -2535 ml   Filed Weights   07/04/17 1608 07/07/17 0440  Weight: (!) 168.7 kg (372 lb) (!) 199.6 kg (440 lb)    Examination: General exam: Appears comfortable  HEENT: PERRLA, oral mucosa moist, no sclera icterus or thrush Respiratory system: Clear to auscultation. Respiratory effort normal. Cardiovascular system: S1 & S2 heard, RRR.   Gastrointestinal system: morbidly obese- Abdomen soft, non-tender, nondistended. Normal bowel sound. No organomegaly Central nervous system: Alert and oriented. No focal  neurological deficits. Extremities and skin: No cyanosis, clubbing- 2 + pitting edema with blisters and weeping Psychiatry:  Mood & affect appropriate.     Data Reviewed: I have personally reviewed following labs and imaging studies  CBC: Recent Labs  Lab 07/04/17 1701 07/07/17 0435  WBC 9.8 6.0  NEUTROABS 7.6  --   HGB 10.0* 9.6*  HCT 33.9* 33.7*  MCV 101.5* 103.4*  PLT 249 241   Basic Metabolic Panel: Recent Labs  Lab 07/04/17 1701 07/05/17 0456 07/07/17 0435  NA 144 145 145  K 4.3 4.5 4.6  CL 108 108 104  CO2 27 30 34*  GLUCOSE 108* 116* 99  BUN 31* 28* 25*  CREATININE 1.34* 1.27* 1.08*  CALCIUM 8.7* 8.9 8.7*   GFR: Estimated Creatinine Clearance: 90.6 mL/min (A) (by C-G formula based on SCr of 1.08 mg/dL (H)). Liver Function Tests: Recent Labs  Lab 07/04/17 1701  AST 16  ALT 17  ALKPHOS 87  BILITOT 0.5  PROT 7.0  ALBUMIN 3.1*   No results for input(s): LIPASE, AMYLASE in the last 168 hours. No results for input(s): AMMONIA in the last 168 hours. Coagulation Profile: No results for input(s): INR, PROTIME in the last 168 hours. Cardiac Enzymes: Recent Labs  Lab 07/04/17 1701 07/04/17 2308 07/05/17 0456  CKTOTAL 91  --   --   TROPONINI <0.03 <0.03 <0.03   BNP (last 3 results) Recent Labs    06/28/17 1247  PROBNP 39.0   HbA1C: No results for input(s): HGBA1C in the last 72 hours. CBG: Recent Labs  Lab 07/06/17 1240 07/06/17 1703 07/06/17 2134 07/07/17 0813 07/07/17 1223  GLUCAP 112* 78 140* 92 166*   Lipid Profile: No results for input(s): CHOL, HDL, LDLCALC, TRIG, CHOLHDL, LDLDIRECT in the last 72 hours. Thyroid Function Tests: No results for input(s): TSH, T4TOTAL, FREET4, T3FREE, THYROIDAB in the last 72 hours. Anemia Panel: No results for input(s): VITAMINB12, FOLATE, FERRITIN, TIBC, IRON, RETICCTPCT in the last 72 hours. Urine analysis:    Component Value Date/Time   COLORURINE YELLOW 07/05/2017 1355   APPEARANCEUR TURBID  (A) 07/05/2017 1355   LABSPEC 1.008 07/05/2017 1355   PHURINE 5.0 07/05/2017 1355   GLUCOSEU NEGATIVE 07/05/2017 1355   GLUCOSEU NEGATIVE 04/05/2011 1129   HGBUR LARGE (A) 07/05/2017 1355   BILIRUBINUR NEGATIVE 07/05/2017 1355   KETONESUR NEGATIVE 07/05/2017 1355   PROTEINUR 30 (A) 07/05/2017 1355   UROBILINOGEN 0.2 04/05/2011 1129   NITRITE NEGATIVE 07/05/2017 1355   LEUKOCYTESUR MODERATE (A) 07/05/2017 1355   Sepsis Labs: @LABRCNTIP (procalcitonin:4,lacticidven:4) ) Recent Results (from the past 240 hour(s))  Blood culture (routine x 2)     Status: None (Preliminary result)   Collection Time: 07/04/17  5:01 PM  Result Value Ref Range Status   Specimen Description   Final    BLOOD RIGHT ANTECUBITAL Performed at [facility], [address] [place], [place] [address]    Special Requests   Final    BOTTLES DRAWN AEROBIC AND ANAEROBIC Blood Culture adequate volume Performed at [facility], [address] [place], [place] [address]    Culture   Final    NO GROWTH 2 DAYS Performed at [facility], [address] [place], [place] [address]    Report Status PENDING  Incomplete  Culture, blood (Routine X 2) w Reflex to ID Panel     Status: None (Preliminary result)   Collection Time: 07/04/17  6:45 PM  Result Value Ref Range Status   Specimen Description   Final    BLOOD LEFT ANTECUBITAL Performed at [facility], [address] [place], [place] [address]    Special Requests   Final    BOTTLES DRAWN AEROBIC AND ANAEROBIC Blood Culture adequate volume Performed at [facility], [address] [place], [place] [address]    Culture   Final    NO GROWTH 2 DAYS Performed at [facility], [address] [place], [place] [address]    Report Status PENDING  Incomplete         Radiology Studies: Ct Renal Stone Study  Result Date: 07/06/2017 CLINICAL DATA:  Hematuria EXAM: CT ABDOMEN  AND PELVIS WITHOUT CONTRAST TECHNIQUE: Multidetector CT imaging of the abdomen and pelvis was performed following the standard protocol without IV contrast. COMPARISON:  None. FINDINGS: Lower chest: Multi segment collapse in the right middle and lower lobe. Coronary atherosclerosis. Hepatobiliary: 1 cm cystic density incidentally noted in the left liver.Cholelithiasis. No evidence of acute cholecystitis. Pancreas: Indistinct pancreas with cystic expansion of the lower head and uncinate process involving a nearly 4 cm area. Spleen: Unremarkable. Adrenals/Urinary Tract: Negative adrenals. Innumerable calculi within the left intrarenal collecting system, which also contains gas ventrally. The left kidney is atrophic. No parenchymal gas. The bladder shows mild circumferential wall thickening with gas ventrally. Bowel loops are close to the dome of the bladder, without suspected fistula. Stomach/Bowel:  No obstruction. No inflammatory changes. Vascular/Lymphatic: No acute vascular abnormality. No mass or adenopathy. Reproductive:Hysterectomy.  Negative adnexae. Other: No ascites or pneumoperitoneum. Musculoskeletal: No acute abnormalities. No acute or aggressive finding. Notable bilateral hip osteoarthritis. These results were called by telephone at the time of interpretation on 07/06/2017 at 1:07 pm to Dr. Calvert Cantor , who verbally acknowledged these results. IMPRESSION: 1. Gas in the left renal pelvis and urinary bladder. 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 2. History of hematuria with numerous calculi throughout the left kidney. Left renal atrophy. 3. Cystic masslike enlargement of the pancreas uncinate process. Recommend pancreas protocol MRI or CT with contrast after return to baseline renal function. 4. Multi segment right middle and lower lobe collapse. 5. Cholelithiasis. Electronically Signed   By: Marnee Spring M.D.   On: 07/06/2017  13:08      Scheduled Meds: . atorvastatin  80 mg Oral q1800  . clopidogrel  75 mg Oral Daily  . collagenase   Topical Daily  . doxycycline  100 mg Oral BID  . enoxaparin (LOVENOX) injection  80 mg Subcutaneous Q24H  . furosemide  20 mg Oral Daily  . insulin aspart  0-9 Units Subcutaneous TID WC  . metoprolol tartrate  12.5 mg Oral BID  . protein supplement shake  11 oz Oral BID BM  . senna-docusate  2 tablet Oral BID   Continuous Infusions: . cefTRIAXone (ROCEPHIN)  IV Stopped (07/06/17 2246)     LOS: 3 days    Time spent in minutes: 30    Calvert Cantor, MD [facility] Pager: [email or web address] Password [email or web address] 07/07/2017, 2:56 PM

## 2017-07-08 DIAGNOSIS — I639 Cerebral infarction, unspecified: Secondary | ICD-10-CM | POA: Diagnosis not present

## 2017-07-08 DIAGNOSIS — E669 Obesity, unspecified: Secondary | ICD-10-CM

## 2017-07-08 DIAGNOSIS — K8689 Other specified diseases of pancreas: Secondary | ICD-10-CM

## 2017-07-08 DIAGNOSIS — I259 Chronic ischemic heart disease, unspecified: Secondary | ICD-10-CM | POA: Diagnosis not present

## 2017-07-08 DIAGNOSIS — I10 Essential (primary) hypertension: Secondary | ICD-10-CM | POA: Diagnosis not present

## 2017-07-08 DIAGNOSIS — K869 Disease of pancreas, unspecified: Secondary | ICD-10-CM

## 2017-07-08 DIAGNOSIS — Z7401 Bed confinement status: Secondary | ICD-10-CM | POA: Diagnosis not present

## 2017-07-08 DIAGNOSIS — E1165 Type 2 diabetes mellitus with hyperglycemia: Secondary | ICD-10-CM

## 2017-07-08 DIAGNOSIS — M2550 Pain in unspecified joint: Secondary | ICD-10-CM | POA: Diagnosis not present

## 2017-07-08 DIAGNOSIS — L03119 Cellulitis of unspecified part of limb: Secondary | ICD-10-CM | POA: Diagnosis not present

## 2017-07-08 DIAGNOSIS — R310 Gross hematuria: Secondary | ICD-10-CM | POA: Diagnosis not present

## 2017-07-08 DIAGNOSIS — N12 Tubulo-interstitial nephritis, not specified as acute or chronic: Secondary | ICD-10-CM

## 2017-07-08 DIAGNOSIS — R0902 Hypoxemia: Secondary | ICD-10-CM | POA: Diagnosis not present

## 2017-07-08 DIAGNOSIS — G4733 Obstructive sleep apnea (adult) (pediatric): Secondary | ICD-10-CM | POA: Diagnosis not present

## 2017-07-08 DIAGNOSIS — I110 Hypertensive heart disease with heart failure: Secondary | ICD-10-CM | POA: Diagnosis not present

## 2017-07-08 DIAGNOSIS — I5032 Chronic diastolic (congestive) heart failure: Secondary | ICD-10-CM | POA: Diagnosis not present

## 2017-07-08 DIAGNOSIS — J9601 Acute respiratory failure with hypoxia: Secondary | ICD-10-CM | POA: Diagnosis not present

## 2017-07-08 LAB — GLUCOSE, CAPILLARY
GLUCOSE-CAPILLARY: 160 mg/dL — AB (ref 65–99)
GLUCOSE-CAPILLARY: 104 mg/dL — AB (ref 65–99)
Glucose-Capillary: 103 mg/dL — ABNORMAL HIGH (ref 65–99)

## 2017-07-08 LAB — PROCALCITONIN

## 2017-07-08 MED ORDER — PREMIER PROTEIN SHAKE: 11.0000 [oz_av] | Freq: Two times a day (BID) | ORAL | 0 refills | Status: AC

## 2017-07-08 MED ORDER — POLYVINYL ALCOHOL 1.4 % OP SOLN: 2.0000 [drp] | OPHTHALMIC | 0 refills | Status: DC | PRN

## 2017-07-08 MED ORDER — SITAGLIPTIN PHOSPHATE 50 MG PO TABS: 50.0000 mg | ORAL_TABLET | Freq: Every day | ORAL | 0 refills | Status: DC

## 2017-07-08 MED ORDER — COLLAGENASE 250 UNIT/GM EX OINT: TOPICAL_OINTMENT | Freq: Every day | CUTANEOUS | 0 refills | Status: DC

## 2017-07-08 MED ORDER — CEFUROXIME AXETIL 500 MG PO TABS: 500.0000 mg | ORAL_TABLET | Freq: Two times a day (BID) | ORAL | 0 refills | Status: AC

## 2017-07-08 MED ORDER — DOXYCYCLINE HYCLATE 100 MG PO TABS: ORAL_TABLET | ORAL | 0 refills | Status: DC

## 2017-07-08 NOTE — Progress Notes (Signed)
Patient unable to get up in the car, brought patient back to the room, Case manager notified and plan to call [facility]. Patient did not want to get back in bed want's to wait until the ambulance pick her up, remained her that it might take up 2-5hrs before she is picked up and she stated she it's okay for now sitting up.

## 2017-07-08 NOTE — Progress Notes (Signed)
Assumed care of patient at 1600. Agree with previous Nurse assessment.  Shyler Hamill M. Raylyn Speckman, RN  

## 2017-07-08 NOTE — Discharge Summary (Addendum)
Physician Discharge Summary  [name] WGN:[id number] DOB: 09-10-1950 DOA: 07/04/2017  PCP: Anne Ng, NP  Admit date: 07/04/2017 Discharge date: 07/08/2017  Admitted From: home Disposition:  Home- declines SNF   Recommendations for Outpatient Follow-up:  1. F/u on hypoxia, Pyelonephritis, mild cellulitis around ruptured blisters and ulcer on left heel  2. Encourage to lose weight, f/u A1c 3. F/u on incidental finding of pancreatic mass 4. F/u on CPAP machine- she states her equipment is infected   Home Health:  ordered    Discharge Condition:  stable   CODE STATUS:  Full code   Consultations:  none    Discharge Diagnoses:  Principal Problem:   Acute hypoxemic respiratory failure (HCC) Active Problems:   Emphysematous pyelonephritis of left kidney   Acute on chronic diastolic congestive heart failure (HCC)   Pancreatic mass- needs MRI   CAD (coronary artery disease)   Hypertension   DM (diabetes mellitus), type 2, uncontrolled (HCC)   OSA (obstructive sleep apnea)   Morbid obesity (HCC)   Super obese   Benign essential HTN    Brief Summary: Megan Atkinson 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  Prior admission> She was admitted from 4/6-4/12 for fatigue and difficulty ambulating. She had not eaten in 4 days and was found to have AKI with Cr of 4.39 (baseline 1.5) , hypernatremia with sodium of 150, acute metabolic encephalopathy, acute L cerebellar infarct adjacent to the area of her prior infarct and an e coli UTI.  She was d/c'd to a SNF and eventually d/c'd to home.  Current admission>  Patient has a caregiver that checks on her daily and apparently patient had not moved from the chair since they left patient yesterday. Patient has soiled herself, was disoriented, joking and "talking out of her head". She is found to be hypoxic with pulse ox of 87%  and was started on diuretics for d CHF. Legs are swollen and weeping. Sleeps upright in a chair and had had worsening bilateral lower extremity swelling since recent hospital discharge. She thinks she has been told in the past to keep her legs elevated but she does not.  Her confusion improved but did not resolve after O2 was placed. CXR did not show any pulmonary edema. BNP was 113. Also noted to have gross hematuria.   She saw her PCP a few days ago, doxycycline was prescribed for earlycellulitis-she had not taken it because she had not been able to pick up the prescription.    Hospital Course:  Principal Problem:   Acute hypoxemic respiratory failure - atelectasis - no Pulm edema on CXR- not on O2 at baseline - she did admit to a cough -   96% on 3 L - obtained CT chest without contrast - 5/30>  CT shows air bronchograms and consolidation- start Ceftriaxone - d/c Keflex- keep Doxy - U strep antigen neg - CT abd noted part of the lungs and revealed >> Multi segment right middle and lower lobe collapse. - started IS - PCCM has evaluated her and also feel that it is atelectasis- this is because of her limited mobility   Active Problems: Hematuria/ nephrolithiasis/ emphysematous pyelonephritis - found to have gross hematuria in ED - rechecked UA- still bloody urine with WBC and bacteria - CT renal stone study obtained   - Gas in the left renal pelvis and urinary  bladder>>> emphysematous pyelitis- numerous calculi throughout the left kidney. Left renal atrophy. - antibiotics were started in ED for cellulitis and therefore sending a U culture will not be helpful-   - has been receiving Ceftriaxone -  hematuria resolved as of today - today is day 5 of Ceftriaxone- will change to Ceftin  Acute encephalopathy - was confused and a bit agitated on admission likely due to pyelonephritis - mentation is normal today- able to tell me to day that she was sent tot he hospital for "hypoxia"  and found to have a "UTI"  Pedal edema- weeping/ blisters with surrounding cellulitis (acute on chronic issue) - due to sitting and sleeping in recliner? - LE duplex negative - given lasix IV x 2-   hold further IV Lasix and resumed oral 20 mg daily (home dose) - have explained elevation to her a few times - wound care consulted - Doxy and keflex started for cellulitis- Keflex changed to Ceftriaxone when suspected to have pneumonia (above)  Ulcer on left heal - she states it is from stepping on something a while back- is not infected but has sloughing necrotic tissue- dressings ordered- HHRN to follow   Hypertension - d/c Norvasc due to pedal edema- it was d/c'd on last discharge from the hospital as well but is still on her med list - cont Metoprolol    Diabetes mellitus 2  - SSI- not on medication at home - A1c is 8.0- will need to start medications on d/c-  Metformin is on allergy list- start Januvia- advised dietary control    OSA (obstructive sleep apnea) - stopped wearing CPAP as she felt the tubing and mask were infected-  - will need to call company to look at it - cont CPAP in hospital- she is not tolerating it well    Morbid obesity (HCC) Body mass index is 68.91 kg/m.  - weight is the same as when she was admitted last month - needs to lose weight   H/o CVAs- cerebellar - cont Plavix and Lipitor  Incidental finding> Cystic masslike enlargement of the pancreas uncinate process. Recommend pancreas protocol MRI or CT with contrast after return to baseline renal function.   Disposition: I have spoken with her son who states that they have been trying to talk her into going to an assisted living and she has been refusing. He is not surprised she is back in the hospital.      Discharge Exam: Vitals:   07/08/17 0500 07/08/17 0835  BP: (!) 155/80 (!) 151/71  Pulse: 76 89  Resp: 18 15  Temp: 98.2 F (36.8 C) 98.2 F (36.8 C)  SpO2: (!) 85% 95%   Vitals:    07/07/17 2144 07/08/17 0100 07/08/17 0500 07/08/17 0835  BP: (!) 143/73 (!) 141/64 (!) 155/80 (!) 151/71  Pulse: 75 78 76 89  Resp: 18 18 18 15   Temp: 98.2 F (36.8 C) (!) 97.4 F (36.3 C) 98.2 F (36.8 C) 98.2 F (36.8 C)  TempSrc: Oral Oral Oral Oral  SpO2: (!) 87% (!) 82% (!) 85% 95%  Weight:      Height:        General: Pt is alert, awake, not in acute distress Cardiovascular: RRR, S1/S2 +, no rubs, no gallops Respiratory: CTA bilaterally, no wheezing, no rhonchi Abdominal: Soft, NT, ND, bowel sounds + Extremities: no edema, no cyanosis   Discharge Instructions  Discharge Instructions    Diet - low sodium heart healthy   Complete by:  As directed    Diet Carb Modified   Complete by:  As directed    Heart healthy, low fat, low sodium, high protein   Discharge instructions   Complete by:  As directed    Your feet must be kept elevated at the level of the abdomen.  Talk you the company that provides your CPAP in regards to getting new equipment and a mask or nasal pillows to suit you. Use incentive spirometry every hour when sitting.  Take a carb restricted, high protein diet to control your diabetes and help you lose weight. Weigh yourself every day and record this weight to show your PCP Keep an updated medication list with you at all times. See your PCP in 1 wk for a full assessment- take your discharge papers with you.  You were cared for by a hospitalist during your hospital stay. If you have any questions about your discharge medications or the care you received while you were in the hospital after you are discharged, you can call the unit and asked to speak with the hospitalist on call if the hospitalist that took care of you is not available. Once you are discharged, your primary care physician will handle any further medical issues. Please note that NO REFILLS for any discharge medications will be authorized once you are discharged, as it is imperative that you  return to your primary care physician (or establish a relationship with a primary care physician if you do not have one) for your aftercare needs so that they can reassess your need for medications and monitor your lab values.  Please take all your medications with you for your next visit with your Primary MD. Please ask your Primary MD to get all Hospital records sent to his/her office. Please request your Primary MD to go over all hospital test results at the follow up.   If you experience worsening of your admission symptoms, develop shortness of breath, chest pain, suicidal or homicidal thoughts or a life threatening emergency, you must seek medical attention immediately by calling 911 or calling your MD.  Bonita Quin must read the complete instructions/literature along with all the possible adverse reactions/side effects for all the medicines you take including new medications that have been prescribed to you. Take new medicines after you have completely understood and accpet all the possible adverse reactions/side effects.   Do not drive when taking pain medications or sedatives.    Do not take more than prescribed Pain, Sleep and Anxiety Medications  If you have smoked or chewed Tobacco in the last 2 yrs please stop. Stop any regular alcohol and or recreational drug use.  Wear Seat belts while driving.   Increase activity slowly   Complete by:  As directed      Allergies as of 07/08/2017      Reactions   Lisinopril    cough   Metformin And Related Diarrhea   A lot of fluid loss from bowel and diarrhea      Medication List    STOP taking these medications   amLODipine 2.5 MG tablet Commonly known as:  NORVASC     TAKE these medications   atorvastatin 80 MG tablet Commonly known as:  LIPITOR TAKE 1 TABLET BY MOUTH ONCE DAILY AT  6  PM   cefUROXime 500 MG tablet Commonly known as:  CEFTIN Take 1 tablet (500 mg total) by mouth 2 (two) times daily for 10 days.    clopidogrel 75 MG tablet Commonly  known as:  PLAVIX Take 1 tablet (75 mg total) by mouth daily.   collagenase ointment Commonly known as:  SANTYL Apply topically daily. Start taking on:  07/09/2017   doxycycline 100 MG tablet Commonly known as:  VIBRA-TABS 100 mg 2 x day- Take for 3 more days What changed:    how much to take  how to take this  when to take this  additional instructions   fluticasone 50 MCG/ACT nasal spray Commonly known as:  FLONASE Place 1 spray into both nostrils daily. What changed:    when to take this  reasons to take this   furosemide 20 MG tablet Commonly known as:  LASIX Take 1 tablet (20 mg total) by mouth daily.   metoprolol tartrate 25 MG tablet Commonly known as:  LOPRESSOR Take 12.5 mg by mouth 2 (two) times daily.   miconazole 2 % powder Commonly known as:  MICOTIN Apply topically 2 times daily at 12 noon and 4 pm.   polyvinyl alcohol 1.4 % ophthalmic solution Commonly known as:  LIQUIFILM TEARS Place 2 drops into both eyes as needed for dry eyes.   protein supplement shake Liqd Commonly known as:  PREMIER PROTEIN Take 325 mLs (11 oz total) by mouth 2 (two) times daily between meals.   pyridOXINE 100 MG tablet Commonly known as:  VITAMIN B-6 Take 100 mg by mouth daily.   senna-docusate 8.6-50 MG tablet Commonly known as:  Senokot-S Take 2 tablets by mouth 2 (two) times daily. What changed:    how much to take  when to take this  reasons to take this   sitaGLIPtin 50 MG tablet Commonly known as:  JANUVIA Take 1 tablet (50 mg total) by mouth daily.            Durable Medical Equipment  (From admission, onward)        Start     Ordered   07/08/17 1005  For home use only DME oxygen  Once    Question Answer Comment  Mode or (Route) Nasal cannula   Liters per Minute 3   Frequency Continuous (stationary and portable oxygen unit needed)   Oxygen conserving device Yes   Oxygen delivery system Gas       07/08/17 1004     Follow-up Information    Parrett, Virgel Bouquetammy S, NP Follow up on 07/17/2017.   Specialty:  Pulmonary Disease Why:  at 4 pm  Contact information: [address] [place] [id number] [phone]          Allergies  Allergen Reactions  . Lisinopril     cough  . Metformin And Related Diarrhea    A lot of fluid loss from bowel and diarrhea     Procedures/Studies:    Ct Chest Wo Contrast  Result Date: 07/05/2017 CLINICAL DATA:  Inpatient. CHF. Hypoxia. Dyspnea. Altered mental status. EXAM: CT CHEST WITHOUT CONTRAST TECHNIQUE: Multidetector CT imaging of the chest was performed following the standard protocol without IV contrast. COMPARISON:  Chest radiograph from one day prior. FINDINGS: Cardiovascular: Mild cardiomegaly. Three-vessel coronary atherosclerosis status post CABG. No significant pericardial effusion/thickening. Atherosclerotic nonaneurysmal thoracic aorta. Top-normal main pulmonary artery (3.0 cm diameter). Mediastinum/Nodes: No discrete thyroid nodules. Unremarkable esophagus. No pathologically enlarged axillary, mediastinal or hilar lymph nodes, noting limited sensitivity for the detection of hilar adenopathy on this noncontrast study. Lungs/Pleura: No pneumothorax. No significant pleural effusion. Low lung volumes. Multiple geographic regions of consolidation with associated air bronchograms and volume loss in the  mid to lower lungs bilaterally, most prominent in the right middle and right lower lobes. No discrete lung masses. No significant pulmonary nodules. Chronic moderate elevation of the right hemidiaphragm. Upper abdomen: Cholelithiasis. Musculoskeletal: No aggressive appearing focal osseous lesions. Intact sternotomy wires. Moderate thoracic spondylosis. IMPRESSION: 1. Low lung volumes. Multiple geographic regions of consolidation with associated air bronchograms and volume loss in the bilateral mid to lower lungs, most prominent in the right middle and  right lower lobes. Atelectasis is favored. Pneumonia is difficult to exclude particularly in the right lower lobe. 2. Chronic elevation of the right hemidiaphragm compatible with eventration. 3. Mild cardiomegaly.  No significant pleural effusions. 4. Cholelithiasis. Aortic Atherosclerosis (ICD10-I70.0). Electronically Signed   By: Delbert Phenix M.D.   On: 07/05/2017 14:49   US Renal  Result Date: 07/04/2017 CLINICAL DATA:  Hematuria. EXAM: RENAL / URINARY TRACT ULTRASOUND COMPLETE COMPARISON:  None. FINDINGS: Right Kidney: Evaluation of the right kidney is markedly limited due to the patient's body habitus. Right kidney measures approximately 12 cm. Left Kidney: Evaluation of the left kidney is markedly limited due to the patient's body habitus. Left kidney measures approximately 10.6 cm. Bladder: Appears normal for degree of bladder distention. IMPRESSION: Evaluation is limited due to patient body habitus. No obvious abnormalities. Electronically Signed   By: Gerome Sam III M.D   On: 07/04/2017 23:05   Dg Chest Port 1 View  Result Date: 07/04/2017 CLINICAL DATA:  Hypoxia. Shortness of breath. Altered mental status. EXAM: PORTABLE CHEST 1 VIEW COMPARISON:  05/15/2017 and 05/14/2017 FINDINGS: Heart size is normal. There is slight pulmonary vascular prominence, most likely due to a shallow inspiration. Chronic elevation of the right hemidiaphragm. Previous median sternotomy. No significant bone abnormality. IMPRESSION: Accentuated vascularity most likely due to a shallow inspiration. Chronic elevation of the right hemidiaphragm. Electronically Signed   By: Francene Boyers M.D.   On: 07/04/2017 17:11   Ct Renal Stone Study  Result Date: 07/06/2017 CLINICAL DATA:  Hematuria EXAM: CT ABDOMEN AND PELVIS WITHOUT CONTRAST TECHNIQUE: Multidetector CT imaging of the abdomen and pelvis was performed following the standard protocol without IV contrast. COMPARISON:  None. FINDINGS: Lower chest: Multi segment  collapse in the right middle and lower lobe. Coronary atherosclerosis. Hepatobiliary: 1 cm cystic density incidentally noted in the left liver.Cholelithiasis. No evidence of acute cholecystitis. Pancreas: Indistinct pancreas with cystic expansion of the lower head and uncinate process involving a nearly 4 cm area. Spleen: Unremarkable. Adrenals/Urinary Tract: Negative adrenals. Innumerable calculi within the left intrarenal collecting system, which also contains gas ventrally. The left kidney is atrophic. No parenchymal gas. The bladder shows mild circumferential wall thickening with gas ventrally. Bowel loops are close to the dome of the bladder, without suspected fistula. Stomach/Bowel:  No obstruction. No inflammatory changes. Vascular/Lymphatic: No acute vascular abnormality. No mass or adenopathy. Reproductive:Hysterectomy.  Negative adnexae. Other: No ascites or pneumoperitoneum. Musculoskeletal: No acute abnormalities. No acute or aggressive finding. Notable bilateral hip osteoarthritis. These results were called by telephone at the time of interpretation on 07/06/2017 at 1:07 pm to Dr. Calvert Cantor , who verbally acknowledged these results. IMPRESSION: 1. Gas in the left renal pelvis and urinary bladder. 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 2. History of hematuria with numerous calculi throughout the left kidney. Left renal atrophy. 3. Cystic masslike enlargement of the pancreas uncinate process. Recommend pancreas protocol MRI or CT with contrast after return to baseline renal function. 4. Multi segment right middle  and lower lobe collapse. 5. Cholelithiasis. Electronically Signed   By: Marnee Spring M.D.   On: 07/06/2017 13:08     The results of significant diagnostics from this hospitalization (including imaging, microbiology, ancillary and laboratory) are listed below for reference.     Microbiology: Recent Results (from the  past 240 hour(s))  Blood culture (routine x 2)     Status: None (Preliminary result)   Collection Time: 07/04/17  5:01 PM  Result Value Ref Range Status   Specimen Description   Final    BLOOD RIGHT ANTECUBITAL Performed at [facility], [address], [place], [place] [address]    Special Requests   Final    BOTTLES DRAWN AEROBIC AND ANAEROBIC Blood Culture adequate volume Performed at [facility], [address] [place], [place] [address]    Culture   Final    NO GROWTH 3 DAYS Performed at [facility], [address], [place], [place] [address]    Report Status PENDING  Incomplete  Culture, blood (Routine X 2) w Reflex to ID Panel     Status: None (Preliminary result)   Collection Time: 07/04/17  6:45 PM  Result Value Ref Range Status   Specimen Description   Final    BLOOD LEFT ANTECUBITAL Performed at [facility], [address], [place], [place] [address]    Special Requests   Final    BOTTLES DRAWN AEROBIC AND ANAEROBIC Blood Culture adequate volume Performed at [facility], [address], [place], [place] [address]    Culture   Final    NO GROWTH 3 DAYS Performed at [facility], [address], [place], [place] [address]    Report Status PENDING  Incomplete     Labs: BNP (last 3 results) Recent Labs    05/12/17 2023 07/04/17 1701  BNP 59.1 113.0*   Basic Metabolic Panel: Recent Labs  Lab 07/04/17 1701 07/05/17 0456 07/07/17 0435  NA 144 145 145  K 4.3 4.5 4.6  CL 108 108 104  CO2 27 30 34*  GLUCOSE 108* 116* 99  BUN 31* 28* 25*  CREATININE 1.34* 1.27* 1.08*  CALCIUM 8.7* 8.9 8.7*   Liver Function Tests: Recent Labs  Lab 07/04/17 1701  AST 16  ALT 17  ALKPHOS 87  BILITOT 0.5  PROT 7.0  ALBUMIN 3.1*   No results for input(s): LIPASE, AMYLASE in the last 168 hours. No results for input(s): AMMONIA in the last 168 hours. CBC: Recent Labs  Lab  07/04/17 1701 07/07/17 0435  WBC 9.8 6.0  NEUTROABS 7.6  --   HGB 10.0* 9.6*  HCT 33.9* 33.7*  MCV 101.5* 103.4*  PLT 249 241   Cardiac Enzymes: Recent Labs  Lab 07/04/17 1701 07/04/17 2308 07/05/17 0456  CKTOTAL 91  --   --   TROPONINI <0.03 <0.03 <0.03   BNP: Invalid input(s): POCBNP CBG: Recent Labs  Lab 07/07/17 0813 07/07/17 1223 07/07/17 1643 07/07/17 2140 07/08/17 0745  GLUCAP 92 166* 95 122* 103*   D-Dimer No results for input(s): DDIMER in the last 72 hours. Hgb A1c No results for input(s): HGBA1C in the last 72 hours. Lipid Profile No results for input(s): CHOL, HDL, LDLCALC, TRIG, CHOLHDL, LDLDIRECT in the last 72 hours. Thyroid function studies No results for input(s): TSH, T4TOTAL, T3FREE, THYROIDAB in the last 72 hours.  Invalid input(s): FREET3 Anemia work up No results for input(s): VITAMINB12, FOLATE, FERRITIN, TIBC, IRON,  RETICCTPCT in the last 72 hours. Urinalysis    Component Value Date/Time   COLORURINE YELLOW 07/05/2017 1355   APPEARANCEUR TURBID (A) 07/05/2017 1355   LABSPEC 1.008 07/05/2017 1355   PHURINE 5.0 07/05/2017 1355   GLUCOSEU NEGATIVE 07/05/2017 1355   GLUCOSEU NEGATIVE 04/05/2011 1129   HGBUR LARGE (A) 07/05/2017 1355   BILIRUBINUR NEGATIVE 07/05/2017 1355   KETONESUR NEGATIVE 07/05/2017 1355   PROTEINUR 30 (A) 07/05/2017 1355   UROBILINOGEN 0.2 04/05/2011 1129   NITRITE NEGATIVE 07/05/2017 1355   LEUKOCYTESUR MODERATE (A) 07/05/2017 1355   Sepsis Labs Invalid input(s): PROCALCITONIN,  WBC,  LACTICIDVEN Microbiology Recent Results (from the past 240 hour(s))  Blood culture (routine x 2)     Status: None (Preliminary result)   Collection Time: 07/04/17  5:01 PM  Result Value Ref Range Status   Specimen Description   Final    BLOOD RIGHT ANTECUBITAL Performed at [facility], [address], [place], [place] [address]    Special Requests   Final    BOTTLES DRAWN AEROBIC AND ANAEROBIC Blood  Culture adequate volume Performed at [facility], [address], [place], [place] [address]    Culture   Final    NO GROWTH 3 DAYS Performed at [facility], [address], [place], [place] [address]    Report Status PENDING  Incomplete  Culture, blood (Routine X 2) w Reflex to ID Panel     Status: None (Preliminary result)   Collection Time: 07/04/17  6:45 PM  Result Value Ref Range Status   Specimen Description   Final    BLOOD LEFT ANTECUBITAL Performed at [facility], [address], [place], [place] [address]    Special Requests   Final    BOTTLES DRAWN AEROBIC AND ANAEROBIC Blood Culture adequate volume Performed at [facility], [address], [place], [place] [address]    Culture   Final    NO GROWTH 3 DAYS Performed at [facility], [address], [place], [place] [address]    Report Status PENDING  Incomplete     Time coordinating discharge in minutes: 75  SIGNED:   Calvert Cantor, MD  [facility] 07/08/2017, 10:13 AM Pager   If 7PM-7AM, please contact night-coverage [email or web address] Password [email or web address]

## 2017-07-08 NOTE — Progress Notes (Signed)
..      Durable Medical Equipment  (From admission, onward)        Start     Ordered   07/08/17 1318  For home use only DME Hospital bed  Once    Question Answer Comment  Patient has (list medical condition): CHF, morbid obesity, OSA   The above medical condition requires: Patient requires the ability to reposition frequently   Head must be elevated greater than: 45 degrees   Bed type Semi-electric   Hoyer Lift Yes   Trapeze Bar Yes   Support Surface: Alternating Pressure Pad and Pump      07/08/17 1318   07/08/17 1005  For home use only DME oxygen  Once    Question Answer Comment  Mode or (Route) Nasal cannula   Liters per Minute 3   Frequency Continuous (stationary and portable oxygen unit needed)   Oxygen conserving device Yes   Oxygen delivery system Gas      07/08/17 1004

## 2017-07-08 NOTE — Care Management Important Message (Signed)
Important Message  Patient Details  Name: Sunnie NielsenBeverley Kottke MRN: [id number] Date of Birth: [birth year]   Medicare Important Message Given:  Yes    Elliot CousinShavis, Kayron Kalmar [name], RN 07/08/2017, 1:24 PM

## 2017-07-08 NOTE — Progress Notes (Signed)
Talk to patient about discharging with ambulance, and patient stated she does not need ambulance that her son is coming to pick her up, tried to call son to be sure he is picking her up and her needs with activity but unable to speak to him.

## 2017-07-08 NOTE — Progress Notes (Signed)
PTAR arranged. Family unable to get pt in the car. Isidoro DonningAlesia Taresa [place] RN CCM Case Mgmt phone [phone]

## 2017-07-08 NOTE — Progress Notes (Signed)
Patient discharged home with son. Discharge instructions given and explained to patient ad she verbalized understanding, denies any pain/distress, accompanied home by son transported to car by staff via wheelchair and home oxygen. MASD-encouraged patient to continue proper skin care and apply barrier cream. Left heel/Right lateral leg wound dressing changed without any sign of infected and F/U care at home with home health.

## 2017-07-08 NOTE — Care Management Note (Addendum)
Case Management Note  Patient Details  Name: Megan Atkinson MRN: [id number] Date of Birth: 1950-05-11  Subjective/Objective:     CHF, OSA, acute respiratory failure               Action/Plan: NCM spoke to pt at bedside and son, Megan Atkinson via phone. Pt is refusing SNF placement. She wants to dc to home. States she has reclining lift chair, bariatric wheelchair, bedside commode and RW at home. States she has an aide that comes every other day that she pays for out of pocket. Son is requesting hospital bed with air mattress. Contacted [facility] for DME and HH resumption of care. Will deliver portable oxygen to room. Will deliver hospital bed and oxygen concentrator to home. [facility]  Expected Discharge Date:  07/08/17               Expected Discharge Plan:  Home w Home Health Services  In-House Referral:  Clinical Social Work  Discharge planning Services  CM Consult  Post Acute Care Choice:    Choice offered to:  Patient  DME Arranged:  Oxygen, Hospital bed DME Agency:  [facility]  HH Arranged:  RN, PT, Nurse's Aide, Social Work, OT, Disease Management, Refused SNF HH Agency:  [facility]  Status of Service:  Complete  If discussed at [employer] Meetings, dates discussed:    Additional Comments:  Megan Atkinson, Megan Abbett [name], RN 07/08/2017, 1:19 PM

## 2017-07-08 NOTE — Progress Notes (Signed)
SATURATION QUALIFICATIONS: (This note is used to comply with regulatory documentation for home oxygen)  Patient Saturations on Room Air at Rest = 85%  Patient Saturations on Room Air while Ambulating = 85%-unable to ambulate  Patient Saturations on 2 Liters of oxygen while Ambulating = 94% patient unable to ambulate Please briefly explain why patient needs home oxygen: Patient unable to ambulate, saturation at rest stayed in the mid 80s with 2L oxygen went up to 92%.

## 2017-07-09 ENCOUNTER — Telehealth: Payer: Self-pay | Admitting: Behavioral Health

## 2017-07-09 DIAGNOSIS — I259 Chronic ischemic heart disease, unspecified: Secondary | ICD-10-CM | POA: Diagnosis not present

## 2017-07-09 DIAGNOSIS — I639 Cerebral infarction, unspecified: Secondary | ICD-10-CM | POA: Diagnosis not present

## 2017-07-09 DIAGNOSIS — I10 Essential (primary) hypertension: Secondary | ICD-10-CM | POA: Diagnosis not present

## 2017-07-09 DIAGNOSIS — I5032 Chronic diastolic (congestive) heart failure: Secondary | ICD-10-CM | POA: Diagnosis not present

## 2017-07-09 LAB — CULTURE, BLOOD (ROUTINE X 2)
Culture: NO GROWTH
Culture: NO GROWTH
Special Requests: ADEQUATE
Special Requests: ADEQUATE

## 2017-07-09 NOTE — Telephone Encounter (Signed)
Patient declines TCM/Hospital Follow-up at this time. She voiced that once she has completed two weeks of antibiotic therapy as advised by Dr. Butler Denmarkizwan, then she will follow-up with Alysia Pennaharlotte Nche, NP.

## 2017-07-10 ENCOUNTER — Telehealth: Payer: Self-pay | Admitting: Nurse Practitioner

## 2017-07-10 NOTE — Telephone Encounter (Signed)
Copied from CRM [id number]. Topic: Inquiry >> Jul 10, 2017  3:47 PM Crist InfanteHarrald, Kathy J wrote: Reason for CRM: [facility] health PT 2 wk /4  Tresa EndoKelly states they just did eval and it is not a good situation.  Pt is all by herself, was totally soiled when they walked in.  Pt is not taking or has any of her meds.  Tresa EndoKelly states pt really does not need to be by herself.  Pt needs 24/7 care.  Tresa EndoKelly states the nurse will probably be calling as well, because they walked in on a horrible situation today. Tresa EndoKelly states this is urgent and would like a call back today please

## 2017-07-10 NOTE — Telephone Encounter (Signed)
Spoke with Alexia FreestonePatty, [facility] regarding the below request; gave verbal order for Home Health PT 2 wk/4 per Dr. Doreene BurkeKremer. Also, she's requesting verbal orders for nursing 3 times a week for 2 weeks & wound care for right lower leg, clean with normal saline, apply calcium alginate, change 3 times a week; left heel wound, clean with normal saline, apply santyl & dry dressing, change 3 times a week; verbal given for additional orders per Dr. Doreene BurkeKremer.

## 2017-07-11 NOTE — Telephone Encounter (Addendum)
Patty, RN with [facility], requested orders for Home Health Aide twice a week for 1 week & three times a week for 1 week. RN gave verbal for the above orders per Dr. Ashley RoyaltyMatthews.

## 2017-07-12 ENCOUNTER — Ambulatory Visit: Payer: Medicare Other | Admitting: Family Medicine

## 2017-07-12 ENCOUNTER — Telehealth: Payer: Self-pay | Admitting: Nurse Practitioner

## 2017-07-12 NOTE — Telephone Encounter (Signed)
Spoke with Fulton MoleAlice, CMSW (Social Work Navigator) at [facility] & informed her that [facility] & faxed to [phone] & [phone]. She confirmed that the form has been received in the office. There were no further questions or concerns prior to call ending.

## 2017-07-12 NOTE — Telephone Encounter (Signed)
Ronny, can you help on this one.

## 2017-07-12 NOTE — Telephone Encounter (Signed)
Megan Atkinson is calling back and states the [facility] needs to be faxed to [phone]. She would like someone to call her at [phone] when it is faxed due to this being a different fax machine that she normally does not use, but hers is down. Please advise.

## 2017-07-12 NOTE — Telephone Encounter (Signed)
Copied from CRM [id number]. Topic: Quick Communication - See Telephone Encounter >> Jul 12, 2017 11:04 AM Eston Mouldavis, Shloma Roggenkamp B wrote: CRM for notification. See Telephone encounter for: 07/12/17.  ALICE FROM [facility] NEEDS TO SPEAK TO SOMEONE ABOUT GETTING PT IN A SNF quickly.      [phone]  she also  states  she tried to fax the office but it did not go through

## 2017-07-12 NOTE — Telephone Encounter (Signed)
Megan Atkinson with [facility] is calling again because the patient is unable to get out of bed to use the bedside commode and has been calling EMS to help her use the bathroom. She is unable to get food or even take her medications. Please fax the [facility] as soon as soon as possible to assist the patient.

## 2017-07-13 ENCOUNTER — Telehealth: Payer: Self-pay | Admitting: Nurse Practitioner

## 2017-07-13 NOTE — Telephone Encounter (Signed)
Provider is aware

## 2017-07-13 NOTE — Telephone Encounter (Signed)
Copied from CRM [id number]. Topic: Quick Communication - See Telephone Encounter >> Jul 13, 2017  4:15 PM Arlyss Gandyichardson, Adison Jerger N, NT wrote: CRM for notification. See Telephone encounter for: 07/13/17. Quanane with [facility] calling and states pt missed a wound care appt for today. Pt did not answer calls or call back. CB#: [phone]

## 2017-07-17 ENCOUNTER — Inpatient Hospital Stay: Payer: Medicare Other | Admitting: Adult Health

## 2017-07-17 ENCOUNTER — Ambulatory Visit: Payer: Medicare Other | Admitting: Nurse Practitioner

## 2017-07-20 DIAGNOSIS — N289 Disorder of kidney and ureter, unspecified: Secondary | ICD-10-CM | POA: Diagnosis not present

## 2017-07-20 DIAGNOSIS — R0902 Hypoxemia: Secondary | ICD-10-CM | POA: Diagnosis not present

## 2017-07-20 DIAGNOSIS — R262 Difficulty in walking, not elsewhere classified: Secondary | ICD-10-CM | POA: Diagnosis not present

## 2017-07-20 DIAGNOSIS — Z9119 Patient's noncompliance with other medical treatment and regimen: Secondary | ICD-10-CM | POA: Diagnosis not present

## 2017-07-20 DIAGNOSIS — R918 Other nonspecific abnormal finding of lung field: Secondary | ICD-10-CM | POA: Diagnosis not present

## 2017-07-20 DIAGNOSIS — I5033 Acute on chronic diastolic (congestive) heart failure: Secondary | ICD-10-CM | POA: Diagnosis not present

## 2017-07-20 DIAGNOSIS — I10 Essential (primary) hypertension: Secondary | ICD-10-CM | POA: Diagnosis not present

## 2017-07-20 DIAGNOSIS — G464 Cerebellar stroke syndrome: Secondary | ICD-10-CM | POA: Diagnosis not present

## 2017-07-20 DIAGNOSIS — R748 Abnormal levels of other serum enzymes: Secondary | ICD-10-CM | POA: Diagnosis present

## 2017-07-20 DIAGNOSIS — I361 Nonrheumatic tricuspid (valve) insufficiency: Secondary | ICD-10-CM | POA: Diagnosis not present

## 2017-07-20 DIAGNOSIS — I071 Rheumatic tricuspid insufficiency: Secondary | ICD-10-CM | POA: Diagnosis present

## 2017-07-20 DIAGNOSIS — G4733 Obstructive sleep apnea (adult) (pediatric): Secondary | ICD-10-CM | POA: Diagnosis not present

## 2017-07-20 DIAGNOSIS — N184 Chronic kidney disease, stage 4 (severe): Secondary | ICD-10-CM | POA: Diagnosis not present

## 2017-07-20 DIAGNOSIS — Z8673 Personal history of transient ischemic attack (TIA), and cerebral infarction without residual deficits: Secondary | ICD-10-CM | POA: Diagnosis not present

## 2017-07-20 DIAGNOSIS — R410 Disorientation, unspecified: Secondary | ICD-10-CM | POA: Diagnosis not present

## 2017-07-20 DIAGNOSIS — E662 Morbid (severe) obesity with alveolar hypoventilation: Secondary | ICD-10-CM | POA: Diagnosis not present

## 2017-07-20 DIAGNOSIS — E119 Type 2 diabetes mellitus without complications: Secondary | ICD-10-CM | POA: Diagnosis not present

## 2017-07-20 DIAGNOSIS — I636 Cerebral infarction due to cerebral venous thrombosis, nonpyogenic: Secondary | ICD-10-CM | POA: Diagnosis not present

## 2017-07-20 DIAGNOSIS — M6281 Muscle weakness (generalized): Secondary | ICD-10-CM | POA: Diagnosis not present

## 2017-07-20 DIAGNOSIS — I130 Hypertensive heart and chronic kidney disease with heart failure and stage 1 through stage 4 chronic kidney disease, or unspecified chronic kidney disease: Secondary | ICD-10-CM | POA: Diagnosis not present

## 2017-07-20 DIAGNOSIS — Z7984 Long term (current) use of oral hypoglycemic drugs: Secondary | ICD-10-CM | POA: Diagnosis not present

## 2017-07-20 DIAGNOSIS — J9602 Acute respiratory failure with hypercapnia: Secondary | ICD-10-CM | POA: Diagnosis not present

## 2017-07-20 DIAGNOSIS — R0602 Shortness of breath: Secondary | ICD-10-CM | POA: Diagnosis not present

## 2017-07-20 DIAGNOSIS — Z951 Presence of aortocoronary bypass graft: Secondary | ICD-10-CM | POA: Diagnosis not present

## 2017-07-20 DIAGNOSIS — Z8249 Family history of ischemic heart disease and other diseases of the circulatory system: Secondary | ICD-10-CM | POA: Diagnosis not present

## 2017-07-20 DIAGNOSIS — N390 Urinary tract infection, site not specified: Secondary | ICD-10-CM | POA: Diagnosis not present

## 2017-07-20 DIAGNOSIS — G9341 Metabolic encephalopathy: Secondary | ICD-10-CM | POA: Diagnosis not present

## 2017-07-20 DIAGNOSIS — R7989 Other specified abnormal findings of blood chemistry: Secondary | ICD-10-CM | POA: Diagnosis not present

## 2017-07-20 DIAGNOSIS — Z7902 Long term (current) use of antithrombotics/antiplatelets: Secondary | ICD-10-CM | POA: Diagnosis not present

## 2017-07-20 DIAGNOSIS — I959 Hypotension, unspecified: Secondary | ICD-10-CM | POA: Diagnosis not present

## 2017-07-20 DIAGNOSIS — E870 Hyperosmolality and hypernatremia: Secondary | ICD-10-CM | POA: Diagnosis not present

## 2017-07-20 DIAGNOSIS — L0390 Cellulitis, unspecified: Secondary | ICD-10-CM | POA: Diagnosis not present

## 2017-07-20 DIAGNOSIS — R945 Abnormal results of liver function studies: Secondary | ICD-10-CM | POA: Diagnosis not present

## 2017-07-20 DIAGNOSIS — D649 Anemia, unspecified: Secondary | ICD-10-CM | POA: Diagnosis not present

## 2017-07-20 DIAGNOSIS — J9601 Acute respiratory failure with hypoxia: Secondary | ICD-10-CM | POA: Diagnosis not present

## 2017-07-20 DIAGNOSIS — I252 Old myocardial infarction: Secondary | ICD-10-CM | POA: Diagnosis not present

## 2017-07-20 DIAGNOSIS — E1140 Type 2 diabetes mellitus with diabetic neuropathy, unspecified: Secondary | ICD-10-CM | POA: Diagnosis not present

## 2017-07-20 DIAGNOSIS — E1122 Type 2 diabetes mellitus with diabetic chronic kidney disease: Secondary | ICD-10-CM | POA: Diagnosis not present

## 2017-07-20 DIAGNOSIS — E785 Hyperlipidemia, unspecified: Secondary | ICD-10-CM | POA: Diagnosis not present

## 2017-07-20 DIAGNOSIS — I2510 Atherosclerotic heart disease of native coronary artery without angina pectoris: Secondary | ICD-10-CM | POA: Diagnosis not present

## 2017-07-20 DIAGNOSIS — K8020 Calculus of gallbladder without cholecystitis without obstruction: Secondary | ICD-10-CM | POA: Diagnosis not present

## 2017-07-20 DIAGNOSIS — E860 Dehydration: Secondary | ICD-10-CM | POA: Diagnosis not present

## 2017-07-20 DIAGNOSIS — R4702 Dysphasia: Secondary | ICD-10-CM | POA: Diagnosis not present

## 2017-07-20 DIAGNOSIS — R5383 Other fatigue: Secondary | ICD-10-CM | POA: Diagnosis not present

## 2017-07-20 DIAGNOSIS — I272 Pulmonary hypertension, unspecified: Secondary | ICD-10-CM | POA: Diagnosis present

## 2017-07-20 DIAGNOSIS — Z6841 Body Mass Index (BMI) 40.0 and over, adult: Secondary | ICD-10-CM | POA: Diagnosis not present

## 2017-07-25 DIAGNOSIS — R5383 Other fatigue: Secondary | ICD-10-CM | POA: Diagnosis not present

## 2017-07-25 DIAGNOSIS — E1140 Type 2 diabetes mellitus with diabetic neuropathy, unspecified: Secondary | ICD-10-CM | POA: Diagnosis not present

## 2017-07-25 DIAGNOSIS — M6281 Muscle weakness (generalized): Secondary | ICD-10-CM | POA: Diagnosis not present

## 2017-07-29 ENCOUNTER — Inpatient Hospital Stay (HOSPITAL_COMMUNITY): Payer: Medicare Other

## 2017-07-29 ENCOUNTER — Other Ambulatory Visit: Payer: Self-pay

## 2017-07-29 ENCOUNTER — Inpatient Hospital Stay (HOSPITAL_COMMUNITY)
Admission: EM | Admit: 2017-07-29 | Discharge: 2017-07-31 | DRG: 189 | Disposition: A | Discharge status: Skilled Nursing Facility | Payer: Medicare Other | Source: Skilled Nursing Facility | Attending: Internal Medicine | Admitting: Internal Medicine

## 2017-07-29 ENCOUNTER — Encounter (HOSPITAL_COMMUNITY): Payer: Self-pay

## 2017-07-29 ENCOUNTER — Emergency Department (HOSPITAL_COMMUNITY): Payer: Medicare Other

## 2017-07-29 DIAGNOSIS — R748 Abnormal levels of other serum enzymes: Secondary | ICD-10-CM | POA: Diagnosis present

## 2017-07-29 DIAGNOSIS — I5031 Acute diastolic (congestive) heart failure: Secondary | ICD-10-CM

## 2017-07-29 DIAGNOSIS — Z7984 Long term (current) use of oral hypoglycemic drugs: Secondary | ICD-10-CM

## 2017-07-29 DIAGNOSIS — D649 Anemia, unspecified: Secondary | ICD-10-CM | POA: Diagnosis present

## 2017-07-29 DIAGNOSIS — J9601 Acute respiratory failure with hypoxia: Secondary | ICD-10-CM | POA: Diagnosis present

## 2017-07-29 DIAGNOSIS — N289 Disorder of kidney and ureter, unspecified: Secondary | ICD-10-CM | POA: Diagnosis not present

## 2017-07-29 DIAGNOSIS — I5033 Acute on chronic diastolic (congestive) heart failure: Secondary | ICD-10-CM | POA: Diagnosis not present

## 2017-07-29 DIAGNOSIS — N390 Urinary tract infection, site not specified: Secondary | ICD-10-CM | POA: Diagnosis not present

## 2017-07-29 DIAGNOSIS — Z7902 Long term (current) use of antithrombotics/antiplatelets: Secondary | ICD-10-CM

## 2017-07-29 DIAGNOSIS — M6281 Muscle weakness (generalized): Secondary | ICD-10-CM | POA: Diagnosis not present

## 2017-07-29 DIAGNOSIS — Z8249 Family history of ischemic heart disease and other diseases of the circulatory system: Secondary | ICD-10-CM

## 2017-07-29 DIAGNOSIS — G4733 Obstructive sleep apnea (adult) (pediatric): Secondary | ICD-10-CM | POA: Diagnosis present

## 2017-07-29 DIAGNOSIS — I2510 Atherosclerotic heart disease of native coronary artery without angina pectoris: Secondary | ICD-10-CM

## 2017-07-29 DIAGNOSIS — I071 Rheumatic tricuspid insufficiency: Secondary | ICD-10-CM | POA: Diagnosis present

## 2017-07-29 DIAGNOSIS — E662 Morbid (severe) obesity with alveolar hypoventilation: Secondary | ICD-10-CM | POA: Diagnosis present

## 2017-07-29 DIAGNOSIS — G9341 Metabolic encephalopathy: Secondary | ICD-10-CM | POA: Diagnosis not present

## 2017-07-29 DIAGNOSIS — I252 Old myocardial infarction: Secondary | ICD-10-CM

## 2017-07-29 DIAGNOSIS — D631 Anemia in chronic kidney disease: Secondary | ICD-10-CM | POA: Diagnosis not present

## 2017-07-29 DIAGNOSIS — G464 Cerebellar stroke syndrome: Secondary | ICD-10-CM | POA: Diagnosis not present

## 2017-07-29 DIAGNOSIS — I636 Cerebral infarction due to cerebral venous thrombosis, nonpyogenic: Secondary | ICD-10-CM | POA: Diagnosis not present

## 2017-07-29 DIAGNOSIS — Z6841 Body Mass Index (BMI) 40.0 and over, adult: Secondary | ICD-10-CM

## 2017-07-29 DIAGNOSIS — E1122 Type 2 diabetes mellitus with diabetic chronic kidney disease: Secondary | ICD-10-CM | POA: Diagnosis not present

## 2017-07-29 DIAGNOSIS — R945 Abnormal results of liver function studies: Secondary | ICD-10-CM | POA: Diagnosis not present

## 2017-07-29 DIAGNOSIS — I130 Hypertensive heart and chronic kidney disease with heart failure and stage 1 through stage 4 chronic kidney disease, or unspecified chronic kidney disease: Secondary | ICD-10-CM | POA: Diagnosis present

## 2017-07-29 DIAGNOSIS — I959 Hypotension, unspecified: Secondary | ICD-10-CM | POA: Diagnosis not present

## 2017-07-29 DIAGNOSIS — E860 Dehydration: Secondary | ICD-10-CM | POA: Diagnosis present

## 2017-07-29 DIAGNOSIS — R5383 Other fatigue: Secondary | ICD-10-CM | POA: Diagnosis not present

## 2017-07-29 DIAGNOSIS — Z951 Presence of aortocoronary bypass graft: Secondary | ICD-10-CM

## 2017-07-29 DIAGNOSIS — Z7401 Bed confinement status: Secondary | ICD-10-CM | POA: Diagnosis not present

## 2017-07-29 DIAGNOSIS — E785 Hyperlipidemia, unspecified: Secondary | ICD-10-CM | POA: Diagnosis not present

## 2017-07-29 DIAGNOSIS — R4702 Dysphasia: Secondary | ICD-10-CM | POA: Diagnosis not present

## 2017-07-29 DIAGNOSIS — I272 Pulmonary hypertension, unspecified: Secondary | ICD-10-CM | POA: Diagnosis present

## 2017-07-29 DIAGNOSIS — L0390 Cellulitis, unspecified: Secondary | ICD-10-CM | POA: Diagnosis not present

## 2017-07-29 DIAGNOSIS — R0602 Shortness of breath: Secondary | ICD-10-CM | POA: Diagnosis not present

## 2017-07-29 DIAGNOSIS — Z8673 Personal history of transient ischemic attack (TIA), and cerebral infarction without residual deficits: Secondary | ICD-10-CM

## 2017-07-29 DIAGNOSIS — I361 Nonrheumatic tricuspid (valve) insufficiency: Secondary | ICD-10-CM | POA: Diagnosis not present

## 2017-07-29 DIAGNOSIS — M2550 Pain in unspecified joint: Secondary | ICD-10-CM | POA: Diagnosis not present

## 2017-07-29 DIAGNOSIS — R918 Other nonspecific abnormal finding of lung field: Secondary | ICD-10-CM | POA: Diagnosis not present

## 2017-07-29 DIAGNOSIS — N184 Chronic kidney disease, stage 4 (severe): Secondary | ICD-10-CM | POA: Diagnosis not present

## 2017-07-29 DIAGNOSIS — J9602 Acute respiratory failure with hypercapnia: Secondary | ICD-10-CM | POA: Diagnosis not present

## 2017-07-29 DIAGNOSIS — R7989 Other specified abnormal findings of blood chemistry: Secondary | ICD-10-CM | POA: Diagnosis not present

## 2017-07-29 DIAGNOSIS — J9600 Acute respiratory failure, unspecified whether with hypoxia or hypercapnia: Secondary | ICD-10-CM | POA: Diagnosis present

## 2017-07-29 DIAGNOSIS — R262 Difficulty in walking, not elsewhere classified: Secondary | ICD-10-CM | POA: Diagnosis not present

## 2017-07-29 DIAGNOSIS — E1140 Type 2 diabetes mellitus with diabetic neuropathy, unspecified: Secondary | ICD-10-CM | POA: Diagnosis not present

## 2017-07-29 DIAGNOSIS — Z9119 Patient's noncompliance with other medical treatment and regimen: Secondary | ICD-10-CM

## 2017-07-29 DIAGNOSIS — K8020 Calculus of gallbladder without cholecystitis without obstruction: Secondary | ICD-10-CM | POA: Diagnosis not present

## 2017-07-29 DIAGNOSIS — R410 Disorientation, unspecified: Secondary | ICD-10-CM | POA: Diagnosis not present

## 2017-07-29 DIAGNOSIS — I5032 Chronic diastolic (congestive) heart failure: Secondary | ICD-10-CM | POA: Diagnosis not present

## 2017-07-29 DIAGNOSIS — R0902 Hypoxemia: Secondary | ICD-10-CM | POA: Diagnosis not present

## 2017-07-29 LAB — COMPREHENSIVE METABOLIC PANEL
ALT: 56 U/L — AB (ref 14–54)
ANION GAP: 7 (ref 5–15)
AST: 53 U/L — ABNORMAL HIGH (ref 15–41)
Albumin: 2.8 g/dL — ABNORMAL LOW (ref 3.5–5.0)
Alkaline Phosphatase: 96 U/L (ref 38–126)
BUN: 31 mg/dL — ABNORMAL HIGH (ref 6–20)
CHLORIDE: 106 mmol/L (ref 101–111)
CO2: 32 mmol/L (ref 22–32)
CREATININE: 1.64 mg/dL — AB (ref 0.44–1.00)
Calcium: 8.8 mg/dL — ABNORMAL LOW (ref 8.9–10.3)
GFR, EST AFRICAN AMERICAN: 36 mL/min — AB (ref >60)
GFR, EST NON AFRICAN AMERICAN: 31 mL/min — AB (ref >60)
Glucose, Bld: 139 mg/dL — ABNORMAL HIGH (ref 65–99)
Potassium: 4.8 mmol/L (ref 3.5–5.1)
SODIUM: 145 mmol/L (ref 135–145)
Total Bilirubin: 0.2 mg/dL — ABNORMAL LOW (ref 0.3–1.2)
Total Protein: 6.6 g/dL (ref 6.5–8.1)

## 2017-07-29 LAB — LIPASE, BLOOD: Lipase: 22 U/L (ref 11–51)

## 2017-07-29 LAB — CBC WITH DIFFERENTIAL/PLATELET
Abs Immature Granulocytes: 0 10*3/uL (ref 0.0–0.1)
Basophils Absolute: 0.1 10*3/uL (ref 0.0–0.1)
Basophils Relative: 1 %
EOS ABS: 0 10*3/uL (ref 0.0–0.7)
Eosinophils Relative: 0 %
HEMATOCRIT: 38.5 % (ref 36.0–46.0)
Hemoglobin: 10.7 g/dL — ABNORMAL LOW (ref 12.0–15.0)
IMMATURE GRANULOCYTES: 0 %
LYMPHS ABS: 1 10*3/uL (ref 0.7–4.0)
Lymphocytes Relative: 12 %
MCH: 29.2 pg (ref 26.0–34.0)
MCHC: 27.8 g/dL — ABNORMAL LOW (ref 30.0–36.0)
MCV: 104.9 fL — ABNORMAL HIGH (ref 78.0–100.0)
Monocytes Absolute: 0.8 10*3/uL (ref 0.1–1.0)
Monocytes Relative: 9 %
NEUTROS ABS: 6.6 10*3/uL (ref 1.7–7.7)
NEUTROS PCT: 78 %
PLATELETS: 215 10*3/uL (ref 150–400)
RBC: 3.67 MIL/uL — AB (ref 3.87–5.11)
RDW: 15.9 % — AB (ref 11.5–15.5)
WBC: 8.5 10*3/uL (ref 4.0–10.5)

## 2017-07-29 LAB — I-STAT ARTERIAL BLOOD GAS, ED
ACID-BASE EXCESS: 6 mmol/L — AB (ref 0.0–2.0)
Bicarbonate: 34.5 mmol/L — ABNORMAL HIGH (ref 20.0–28.0)
O2 SAT: 99 %
PCO2 ART: 74.5 mmHg — AB (ref 32.0–48.0)
PH ART: 7.274 — AB (ref 7.350–7.450)
Patient temperature: 98.6
TCO2: 37 mmol/L — ABNORMAL HIGH (ref 22–32)
pO2, Arterial: 177 mmHg — ABNORMAL HIGH (ref 83.0–108.0)

## 2017-07-29 LAB — RESPIRATORY PANEL BY PCR
Adenovirus: NOT DETECTED
BORDETELLA PERTUSSIS-RVPCR: NOT DETECTED
CORONAVIRUS 229E-RVPPCR: NOT DETECTED
CORONAVIRUS HKU1-RVPPCR: NOT DETECTED
CORONAVIRUS NL63-RVPPCR: NOT DETECTED
CORONAVIRUS OC43-RVPPCR: NOT DETECTED
Chlamydophila pneumoniae: NOT DETECTED
Influenza A: NOT DETECTED
Influenza B: NOT DETECTED
METAPNEUMOVIRUS-RVPPCR: NOT DETECTED
Mycoplasma pneumoniae: NOT DETECTED
PARAINFLUENZA VIRUS 1-RVPPCR: NOT DETECTED
PARAINFLUENZA VIRUS 2-RVPPCR: NOT DETECTED
PARAINFLUENZA VIRUS 3-RVPPCR: NOT DETECTED
Parainfluenza Virus 4: NOT DETECTED
RHINOVIRUS / ENTEROVIRUS - RVPPCR: NOT DETECTED
Respiratory Syncytial Virus: NOT DETECTED

## 2017-07-29 LAB — TROPONIN I
TROPONIN I: 1.09 ng/mL — AB (ref <0.03)
Troponin I: 1.69 ng/mL (ref <0.03)

## 2017-07-29 LAB — BRAIN NATRIURETIC PEPTIDE: B NATRIURETIC PEPTIDE 5: 1160 pg/mL — AB (ref 0.0–100.0)

## 2017-07-29 LAB — GLUCOSE, CAPILLARY
GLUCOSE-CAPILLARY: 59 mg/dL — AB (ref 65–99)
Glucose-Capillary: 67 mg/dL (ref 65–99)
Glucose-Capillary: 80 mg/dL (ref 65–99)

## 2017-07-29 LAB — MRSA PCR SCREENING: MRSA by PCR: NEGATIVE

## 2017-07-29 MED ORDER — FUROSEMIDE 20 MG PO TABS: 20.0000 mg | ORAL_TABLET | Freq: Every day | ORAL | Status: DC

## 2017-07-29 MED ORDER — CEFEPIME HCL 1 G IJ SOLR
1.0000 g | Freq: Three times a day (TID) | INTRAMUSCULAR | Status: DC
  Administered 2017-07-29 – 2017-07-30 (×3): 1 g via INTRAVENOUS
  Filled 2017-07-29 (×4): qty 1

## 2017-07-29 MED ORDER — VANCOMYCIN HCL 10 G IV SOLR
2500.0000 mg | Freq: Once | INTRAVENOUS | Status: AC
  Administered 2017-07-29: 2500 mg via INTRAVENOUS
  Filled 2017-07-29: qty 2500

## 2017-07-29 MED ORDER — PRO-STAT SUGAR FREE PO LIQD
30.0000 mL | Freq: Two times a day (BID) | ORAL | Status: DC
  Administered 2017-07-30 – 2017-07-31 (×3): 30 mL via ORAL
  Filled 2017-07-29 (×3): qty 30

## 2017-07-29 MED ORDER — VITAMIN B-6 100 MG PO TABS
100.0000 mg | ORAL_TABLET | Freq: Every day | ORAL | Status: DC
  Administered 2017-07-30 – 2017-07-31 (×2): 100 mg via ORAL
  Filled 2017-07-29 (×2): qty 1

## 2017-07-29 MED ORDER — INSULIN ASPART 100 UNIT/ML ~~LOC~~ SOLN
0.0000 [IU] | Freq: Three times a day (TID) | SUBCUTANEOUS | Status: DC
  Administered 2017-07-30: 1 [IU] via SUBCUTANEOUS

## 2017-07-29 MED ORDER — METOPROLOL TARTRATE 12.5 MG HALF TABLET
12.5000 mg | ORAL_TABLET | Freq: Two times a day (BID) | ORAL | Status: DC
  Administered 2017-07-30 – 2017-07-31 (×2): 12.5 mg via ORAL
  Filled 2017-07-29 (×3): qty 1

## 2017-07-29 MED ORDER — ENOXAPARIN SODIUM 30 MG/0.3ML ~~LOC~~ SOLN
30.0000 mg | SUBCUTANEOUS | Status: DC
  Administered 2017-07-29: 30 mg via SUBCUTANEOUS
  Filled 2017-07-29: qty 0.3

## 2017-07-29 MED ORDER — DEXTROSE 5 % IV SOLN
INTRAVENOUS | Status: DC
  Administered 2017-07-30: via INTRAVENOUS

## 2017-07-29 MED ORDER — CLOPIDOGREL BISULFATE 75 MG PO TABS
75.0000 mg | ORAL_TABLET | Freq: Every day | ORAL | Status: DC
  Administered 2017-07-30 – 2017-07-31 (×2): 75 mg via ORAL
  Filled 2017-07-29 (×2): qty 1

## 2017-07-29 MED ORDER — DEXTROSE 50 % IV SOLN
INTRAVENOUS | Status: AC
  Administered 2017-07-29: 50 mL
  Filled 2017-07-29: qty 50

## 2017-07-29 MED ORDER — SODIUM CHLORIDE 0.9% FLUSH: 3.0000 mL | INTRAVENOUS | Status: DC | PRN

## 2017-07-29 MED ORDER — FUROSEMIDE 10 MG/ML IJ SOLN
40.0000 mg | Freq: Once | INTRAMUSCULAR | Status: AC
  Administered 2017-07-29: 40 mg via INTRAVENOUS
  Filled 2017-07-29: qty 4

## 2017-07-29 MED ORDER — SODIUM CHLORIDE 0.9 % IV SOLN: 250.0000 mL | INTRAVENOUS | Status: DC | PRN

## 2017-07-29 MED ORDER — VANCOMYCIN HCL 10 G IV SOLR: 1750.0000 mg | INTRAVENOUS | Status: DC

## 2017-07-29 MED ORDER — ATORVASTATIN CALCIUM 80 MG PO TABS
80.0000 mg | ORAL_TABLET | Freq: Every day | ORAL | Status: DC
  Administered 2017-07-30: 80 mg via ORAL
  Filled 2017-07-29: qty 1

## 2017-07-29 MED ORDER — INSULIN ASPART 100 UNIT/ML ~~LOC~~ SOLN: 0.0000 [IU] | Freq: Every day | SUBCUTANEOUS | Status: DC

## 2017-07-29 MED ORDER — GABAPENTIN 100 MG PO CAPS
100.0000 mg | ORAL_CAPSULE | Freq: Two times a day (BID) | ORAL | Status: DC
  Administered 2017-07-30 – 2017-07-31 (×3): 100 mg via ORAL
  Filled 2017-07-29 (×3): qty 1

## 2017-07-29 MED ORDER — LINAGLIPTIN 5 MG PO TABS
5.0000 mg | ORAL_TABLET | Freq: Every day | ORAL | Status: DC
  Administered 2017-07-30 – 2017-07-31 (×2): 5 mg via ORAL
  Filled 2017-07-29 (×2): qty 1

## 2017-07-29 MED ORDER — SENNOSIDES-DOCUSATE SODIUM 8.6-50 MG PO TABS: 1.0000 | ORAL_TABLET | Freq: Every evening | ORAL | Status: DC | PRN

## 2017-07-29 MED ORDER — DEXTROSE 50 % IV SOLN
25.0000 mL | Freq: Once | INTRAVENOUS | Status: AC
  Administered 2017-07-29: 25 mL via INTRAVENOUS

## 2017-07-29 MED ORDER — SODIUM CHLORIDE 0.9% FLUSH
3.0000 mL | Freq: Two times a day (BID) | INTRAVENOUS | Status: DC
  Administered 2017-07-29 – 2017-07-31 (×4): 3 mL via INTRAVENOUS

## 2017-07-29 MED ORDER — DEXTROSE 50 % IV SOLN
INTRAVENOUS | Status: AC
  Administered 2017-07-29: 25 mL via INTRAVENOUS
  Filled 2017-07-29: qty 50

## 2017-07-29 NOTE — ED Triage Notes (Signed)
Pt presents for evaluation of SOB today. [facility], reportedly low O2 sats on RA of 80%. Pt hypotensive at 90/60 by EMS. Alert and oriented.

## 2017-07-29 NOTE — ED Provider Notes (Signed)
[facility] EMERGENCY DEPARTMENT Provider Note   CSN: [id number] Arrival date & time: 07/29/17  1101     History   Chief Complaint No chief complaint on file.   HPI Somara Frymire is a 67 y.o. female who presents for hypoxemia and confusion.  She has a past medical history of super morbid obesity, coronary artery disease, diabetes, obstructive sleep apnea.  The patient was sent in from her skilled nursing facility for hypoxia.  She is normally on 2 to 3 L was noted this morning to be in the 70s.  She was bumped up to 6 L and only came up into the 80s.  Prior to arrival by EMS the patient was on an NRB at 10 to 15 L with oxygen saturations barely above 90.  She does not have a history of COPD and does not have a history of CHF.  Her last echocardiogram in April 2019 showed grade 1 diastolic dysfunction and an EF of 60 to 65%.  The patient is on Plavix which she takes daily.  She was admitted back in April 2019 for weakness and difficulty ambulating.  She was found to have acute kidney injury, hypernatremia, acute metabolic encephalopathy in the left there are Beller infarct.  Patient presented from the skilled nursing facility and was admitted 5/29 to 07/08/2017 for hypoxemic respiratory failure.  Patient had stopped using her CPAP at night because she thought it was "infected."  During her last admission she was found to have pulmonary edema, pyelo-nephritis, cellulitis. Spoke with the medical caretaker at the patient's nursing facility.  Patient has not had or been using a CPAP machine during her stay for the past 21 days at the skilled nursing facility.  HPI  Past Medical History:  Diagnosis Date  . CAD (coronary artery disease)    small NSTEMI 11/12: LHC 01/06/11:  LAD 90-95%, mD1 90%, pD2 50%,  pOM1 50%, RCA occluded,  EF > 65%;  CABG  12/12: L-LAD  . Diabetes mellitus   . Glucose intolerance (impaired glucose tolerance)    Hemoglobin A1c 6.4 in 12/2010  . HTN  (hypertension)    Echo 01/06/11: EF 65%  . Hyperlipidemia   . Obesity   . Stroke [facility]) 03/06/2016    Patient Active Problem List   Diagnosis Date Noted  . Pancreatic mass- needs MRI 07/08/2017  . Emphysematous pyelonephritis of left kidney 07/06/2017  . Acute on chronic diastolic congestive heart failure (HCC)   . Acute hypoxemic respiratory failure (HCC) 07/05/2017  . Functional urinary incontinence 06/28/2017  . Mycotic toenails 06/28/2017  . Intertrigo 06/28/2017  . Bilateral leg edema 06/28/2017  . Cerebral thrombosis with cerebral infarction 05/16/2017  . SIRS (systemic inflammatory response syndrome) (HCC) 05/13/2017  . Acute lower UTI 05/13/2017  . Acute kidney injury (HCC) 05/12/2017  . Elevated troponin 05/12/2017  . Dehydration 05/12/2017  . Hypernatremia 05/12/2017  . Elevated CK 05/12/2017  . Prolonged QT interval 05/12/2017  . Leukocytosis 05/12/2017  . Acute renal failure (ARF) (HCC) 05/12/2017  . Acute blood loss anemia   . Elevated serum creatinine   . Slow transit constipation   . Benign essential HTN   . Generalized anxiety disorder   . Gait disturbance, post-stroke 03/13/2016  . Cerebellar stroke, acute (HCC) 03/10/2016  . Super obese   . Prediabetes   . Ataxia, post-stroke   . Acute cystitis without hematuria   . CVA (cerebral vascular accident) (HCC) 03/06/2016  . Morbid obesity (HCC) 02/29/2016  . OSA (  obstructive sleep apnea) 11/24/2014  . Dyspnea 10/02/2014  . Anemia 04/05/2011  . Urinary tract infection, site not specified 04/05/2011  . DM (diabetes mellitus), type 2, uncontrolled (HCC) 02/22/2011  . Hyperlipidemia 02/22/2011  . CAD (coronary artery disease) 01/07/2011  . Hypertension 01/07/2011    Past Surgical History:  Procedure Laterality Date  . ABDOMINAL HYSTERECTOMY    . APPENDECTOMY    . CORONARY ARTERY BYPASS GRAFT  01/12/2011   Procedure: CORONARY ARTERY BYPASS GRAFTING (CABG);  Surgeon: Alleen Borne, MD;  Location[facility] OR;   Service: Open Heart Surgery;  Laterality: N/A;  coronary artery bypass graft times one using left internal mammary artery . Attempted endoscopic saphenous vein harvest  . LEFT HEART CATHETERIZATION WITH CORONARY ANGIOGRAM N/A 01/06/2011   Procedure: LEFT HEART CATHETERIZATION WITH CORONARY ANGIOGRAM;  Surgeon: Rollene Rotunda, MD;  Location[facility] CATH LAB;  Service: Cardiovascular;  Laterality: N/A;     OB History   None      Home Medications    Prior to Admission medications   Medication Sig Start Date End Date Taking? Authorizing Provider  atorvastatin (LIPITOR) 80 MG tablet TAKE 1 TABLET BY MOUTH ONCE DAILY AT  6  PM 12/07/16   Lewayne Bunting, MD  clopidogrel (PLAVIX) 75 MG tablet Take 1 tablet (75 mg total) by mouth daily. 12/07/16   Lewayne Bunting, MD  collagenase (SANTYL) ointment Apply topically daily. 07/09/17   Calvert Cantor, MD  doxycycline (VIBRA-TABS) 100 MG tablet 100 mg 2 x day- Take for 3 more days 07/08/17   Calvert Cantor, MD  fluticasone (FLONASE) 50 MCG/ACT nasal spray Place 1 spray into both nostrils daily. Patient taking differently: Place 1 spray into both nostrils daily as needed for allergies.  04/01/16   Love, Evlyn Kanner, PA-C  furosemide (LASIX) 20 MG tablet Take 1 tablet (20 mg total) by mouth daily. 06/28/17   Nche, Bonna Gains, NP  metoprolol tartrate (LOPRESSOR) 25 MG tablet Take 12.5 mg by mouth 2 (two) times daily.    [provider]  miconazole (MICOTIN) 2 % powder Apply topically 2 times daily at 12 noon and 4 pm. 06/28/17   Nche, Bonna Gains, NP  polyvinyl alcohol (LIQUIFILM TEARS) 1.4 % ophthalmic solution Place 2 drops into both eyes as needed for dry eyes. 07/08/17   Calvert Cantor, MD  protein supplement shake (PREMIER PROTEIN) LIQD Take 325 mLs (11 oz total) by mouth 2 (two) times daily between meals. 07/08/17   Calvert Cantor, MD  pyridOXINE (VITAMIN B-6) 100 MG tablet Take 100 mg by mouth daily.    [provider]  senna-docusate (SENOKOT-S)  8.6-50 MG tablet Take 2 tablets by mouth 2 (two) times daily. Patient taking differently: Take 1 tablet by mouth at bedtime as needed for mild constipation or moderate constipation.  03/31/16   Love, Evlyn Kanner, PA-C  sitaGLIPtin (JANUVIA) 50 MG tablet Take 1 tablet (50 mg total) by mouth daily. 07/08/17   Calvert Cantor, MD    Family History Family History  Problem Relation Age of Onset  . Coronary artery disease Unknown        No family history  . Heart disease Mother   . Heart disease Father   . Heart disease Maternal Grandmother     Social History Social History   Tobacco Use  . Smoking status: Never Smoker  . Smokeless tobacco: Never Used  Substance Use Topics  . Alcohol use: Yes    Comment: Occasional  . Drug use: No  Allergies   Lisinopril and Metformin and related   Review of Systems Review of Systems Ten systems reviewed and are negative for acute change, except as noted in the HPI.    Physical Exam Updated Vital Signs There were no vitals taken for this visit.  Physical Exam  Constitutional: She is oriented to person, place, and time. No distress.  Morbidly obese female, appears weak   HENT:  Head: Normocephalic and atraumatic.  Eyes: Pupils are equal, round, and reactive to light. Conjunctivae and EOM are normal. No scleral icterus.  Neck: Normal range of motion.  Cardiovascular: Normal rate, regular rhythm and normal heart sounds. Exam reveals no gallop and no friction rub.  No murmur heard. Pulmonary/Chest:  Poor effort, poor air movement. Difficult to auscultate breath sounds.  Abdominal: Soft. Bowel sounds are normal. She exhibits no distension and no mass. There is no tenderness. There is no guarding.  Neurological: She is alert and oriented to person, place, and time.  Skin: Skin is warm and dry. She is not diaphoretic.  Psychiatric: Her behavior is normal.  Nursing note and vitals reviewed.    ED Treatments / Results  Labs (all labs  ordered are listed, but only abnormal results are displayed) Labs Reviewed - No data to display  EKG None  Radiology No results found.  Procedures Procedures (including critical care time)  Medications Ordered in ED Medications - No data to display   Initial Impression / Assessment and Plan / ED Course  I have reviewed the triage vital signs and the nursing notes.  Pertinent labs & imaging results that were available during my care of the patient were reviewed by me and considered in my medical decision making (see chart for details).  Clinical Course as of Jul 30 1706  Sun Jul 29, 2017  1302 Patient with hypercarbic respiratory failure  pH, Arterial(!): 7.274 [AH]  1303 Creatinine(!): 1.64 [AH]  1303 B Natriuretic Peptide(!): 1,160.0 [AH]  1303 Patient' BNP is markedly elevated   [AH]  1316 CXR without evidence of pulmonary edema, however it is difficult to assess   [AH]  1318 Ekg shows sinus tachycardia   [AH]    Clinical Course User Index [AH] Arthor CaptainHarris, Harlee Pursifull, PA-C    Patient here with acute hypercapnic respiratory failure.  I suspect underlying CHF although it is unclear by her chest x-ray.  Her BNP is 1160 markedly increased from her last value.  Patient also not using her CPAP for the past month which may be exacerbating her obesity hypoventilation.  She is afebrile.  Patient will be admitted to the stepdown unit on BiPAP. Final Clinical Impressions(s) / ED Diagnoses   Final diagnoses:  None    ED Discharge Orders    None       Arthor CaptainHarris, Trystin Hargrove, PA-C 07/29/17 1709    Jacalyn LefevreHaviland, Julie, MD 07/30/17 1528

## 2017-07-29 NOTE — H&P (Addendum)
TRH H&P   Patient Demographics:    Megan Atkinson, is a 67 y.o. female  MRN: [id number]   DOB - 1950/12/07  Admit Date - 07/29/2017  Outpatient Primary MD for the patient is Nche, Bonna Gains, NP  Referring MD/NP/PA:  Arthor Captain  Outpatient Specialists:    Patient coming from:  SNF  Chief Complaint  Patient presents with  . Shortness of Breath      HPI:    Megan Atkinson  is a 67 y.o. female, w hypertension, hyperlipidemia, cad, stroke, glucose intolerance, morbid obeisity (BMI >40), OSA, (noncompliant with cpap), apparently presents with altered mental status, found to have hypoxic, hypercapneic respiratory failure per Ed.   Pt is not able to provide significant history.   In ED,   CT chest IMPRESSION: Chronic consolidation posteromedial right base likely atelectasis. Subtle patchy hazy opacification over the right lower lobe and central left lung which may be due to infection, mild edema or atelectasis.  Atherosclerotic coronary artery disease.  Na 145, K 4.8, Bun 31, Creatinin 1.64 Ast 53, Alt 56 Alb 2.8  Wbc 8.5, Hgb 10.7, Plt 215 mcv 104.9 Lipase 22  BNP 1,160  Abg  PH 7.274, PCo2 74.5, po2 177  Pt is currently on bipap  Pt will be admitted for acute respiratory failure (hypoxic and hypercapneic) as well as AMS secondary to above     Review of systems:    In addition to the HPI above,  Difficult to get history due to AMS No Fever-chills, No Headache, No changes with Vision or hearing, No problems swallowing food or Liquids, No Chest pain, No Abdominal pain, No Nausea or Vommitting, Bowel movements are regular, No Blood in stool or Urine, No dysuria, No new skin rashes or bruises, No new joints pains-aches,  No new weakness, tingling, numbness in any extremity, No recent weight gain or loss, No polyuria, polydypsia or polyphagia, No  significant Mental Stressors.  A full 10 point Review of Systems was done, except as stated above, all other Review of Systems were negative.   With Past History of the following :    Past Medical History:  Diagnosis Date  . CAD (coronary artery disease)    small NSTEMI 11/12: LHC 01/06/11:  LAD 90-95%, mD1 90%, pD2 50%,  pOM1 50%, RCA occluded,  EF > 65%;  CABG  12/12: L-LAD  . Diabetes mellitus   . Glucose intolerance (impaired glucose tolerance)    Hemoglobin A1c 6.4 in 12/2010  . HTN (hypertension)    Echo 01/06/11: EF 65%  . Hyperlipidemia   . Obesity   . Stroke [facility]) 03/06/2016      Past Surgical History:  Procedure Laterality Date  . ABDOMINAL HYSTERECTOMY    . APPENDECTOMY    . CORONARY ARTERY BYPASS GRAFT  01/12/2011   Procedure: CORONARY ARTERY BYPASS GRAFTING (CABG);  Surgeon: Alleen Borne, MD;  Location: [facility] OR;  Service: Open Heart Surgery;  Laterality: N/A;  coronary artery bypass graft times one using left internal mammary artery . Attempted endoscopic saphenous vein harvest  . LEFT HEART CATHETERIZATION WITH CORONARY ANGIOGRAM N/A 01/06/2011   Procedure: LEFT HEART CATHETERIZATION WITH CORONARY ANGIOGRAM;  Surgeon: Rollene RotundaJames Hochrein, MD;  [facility];  Service: Cardiovascular;  Laterality: N/A;      Social History:     Social History   Tobacco Use  . Smoking status: Never Smoker  . Smokeless tobacco: Never Used  Substance Use Topics  . Alcohol use: Yes    Comment: Occasional     Lives - at ? SNF  Mobility - walks by self      Family History :     Family History  Problem Relation Age of Onset  . Coronary artery disease Unknown        No family history  . Heart disease Mother   . Heart disease Father   . Heart disease Maternal Grandmother        Home Medications:   Prior to Admission medications   Medication Sig Start Date End Date Taking? Authorizing Provider  acetaminophen (TYLENOL) 325 MG tablet Take 650 mg by mouth every 6  (six) hours as needed.   Yes [provider]  Amino Acids-Protein Hydrolys (FEEDING SUPPLEMENT, PRO-STAT SUGAR FREE 64,) LIQD Take 30 mLs by mouth 3 (three) times daily with meals.   Yes [provider]  atorvastatin (LIPITOR) 80 MG tablet TAKE 1 TABLET BY MOUTH ONCE DAILY AT  6  PM 12/07/16  Yes Lewayne Buntingrenshaw, Brian S, MD  cefUROXime (CEFTIN) 500 MG tablet Take 500 mg by mouth 2 (two) times daily with a meal. For cellulitis 07/20/17 07/30/17 Yes [provider]  clopidogrel (PLAVIX) 75 MG tablet Take 1 tablet (75 mg total) by mouth daily. 12/07/16  Yes Lewayne Buntingrenshaw, Brian S, MD  furosemide (LASIX) 20 MG tablet Take 1 tablet (20 mg total) by mouth daily. 06/28/17  Yes Nche, Bonna Gainsharlotte Lum, NP  gabapentin (NEURONTIN) 100 MG capsule Take 100 mg by mouth 2 (two) times daily.   Yes [provider]  metoprolol tartrate (LOPRESSOR) 25 MG tablet Take 12.5 mg by mouth 2 (two) times daily.   Yes [provider]  pyridoxine (B-6) 100 MG tablet Take 100 mg by mouth daily.   Yes [provider]  senna-docusate (SENOKOT-S) 8.6-50 MG tablet Take 2 tablets by mouth 2 (two) times daily. Patient taking differently: Take 1 tablet by mouth at bedtime as needed for mild constipation or moderate constipation.  03/31/16  Yes Love, Evlyn KannerPamela S, PA-C  sitaGLIPtin (JANUVIA) 50 MG tablet Take 1 tablet (50 mg total) by mouth daily. 07/08/17  Yes Calvert Cantorizwan, Saima, MD  collagenase (SANTYL) ointment Apply topically daily. Patient not taking: Reported on 07/29/2017 07/09/17   Calvert Cantorizwan, Saima, MD  doxycycline (VIBRA-TABS) 100 MG tablet 100 mg 2 x day- Take for 3 more days Patient taking differently: Take 100 mg by mouth 2 (two) times daily.  07/08/17   Calvert Cantorizwan, Saima, MD  fluticasone (FLONASE) 50 MCG/ACT nasal spray Place 1 spray into both nostrils daily. Patient taking differently: Place 1 spray into both nostrils daily as needed for allergies.  04/01/16   Love, Evlyn KannerPamela S, PA-C  miconazole (MICOTIN) 2 %  powder Apply topically 2 times daily at 12 noon and 4 pm. Patient not taking: Reported on 07/29/2017 06/28/17   Nche, Bonna Gainsharlotte Lum, NP  polyvinyl alcohol (LIQUIFILM TEARS) 1.4 %  ophthalmic solution Place 2 drops into both eyes as needed for dry eyes. Patient not taking: Reported on 07/29/2017 07/08/17   Calvert Cantor, MD  protein supplement shake (PREMIER PROTEIN) LIQD Take 325 mLs (11 oz total) by mouth 2 (two) times daily between meals. Patient not taking: Reported on 07/29/2017 07/08/17   Calvert Cantor, MD     Allergies:     Allergies  Allergen Reactions  . Lisinopril     cough  . Metformin And Related Diarrhea    A lot of fluid loss from bowel and diarrhea     Physical Exam:   Vitals  Blood pressure 129/65, pulse 74, temperature 98.5 F (36.9 C), temperature source Oral, resp. rate 20, SpO2 99 %.   1. General lying in bed in NAD,    2. Normal affect and insight, Not Suicidal or Homicidal, Awake Alert, Oriented X 3.  3. No F.N deficits, ALL C.Nerves Intact, Strength 5/5 all 4 extremities, Sensation intact all 4 extremities, Plantars down going.  4. Ears and Eyes appear Normal, Conjunctivae clear, PERRLA. Moist Oral Mucosa.  5. Supple Neck, No JVD, No cervical lymphadenopathy appriciated, No Carotid Bruits.  6. Symmetrical Chest wall movement, slight crackle bilateral base, no wheezing  7. RRR, No Gallops, Rubs or Murmurs, No Parasternal Heave.  8. Positive Bowel Sounds, Abdomen Soft, No tenderness, No organomegaly appriciated,No rebound -guarding or rigidity.  9.  No Cyanosis, Normal Skin Turgor, No Skin Rash or Bruise.  10. Good muscle tone,  joints appear normal , no effusions, Normal ROM.  11. No Palpable Lymph Nodes in Neck or Axillae      Data Review:    CBC Recent Labs  Lab 07/29/17 1119  WBC 8.5  HGB 10.7*  HCT 38.5  PLT 215  MCV 104.9*  MCH 29.2  MCHC 27.8*  RDW 15.9*  LYMPHSABS 1.0  MONOABS 0.8  EOSABS 0.0  BASOSABS 0.1    ------------------------------------------------------------------------------------------------------------------  Chemistries  Recent Labs  Lab 07/29/17 1119  NA 145  K 4.8  CL 106  CO2 32  GLUCOSE 139*  BUN 31*  CREATININE 1.64*  CALCIUM 8.8*  AST 53*  ALT 56*  ALKPHOS 96  BILITOT 0.2*   ------------------------------------------------------------------------------------------------------------------ CrCl cannot be calculated (Unknown ideal weight.). ------------------------------------------------------------------------------------------------------------------ No results for input(s): TSH, T4TOTAL, T3FREE, THYROIDAB in the last 72 hours.  Invalid input(s): FREET3  Coagulation profile No results for input(s): INR, PROTIME in the last 168 hours. ------------------------------------------------------------------------------------------------------------------- No results for input(s): DDIMER in the last 72 hours. -------------------------------------------------------------------------------------------------------------------  Cardiac Enzymes No results for input(s): CKMB, TROPONINI, MYOGLOBIN in the last 168 hours.  Invalid input(s): CK ------------------------------------------------------------------------------------------------------------------    Component Value Date/Time   BNP 1,160.0 (H) 07/29/2017 1119     ---------------------------------------------------------------------------------------------------------------  Urinalysis    Component Value Date/Time   COLORURINE YELLOW 07/05/2017 1355   APPEARANCEUR TURBID (A) 07/05/2017 1355   LABSPEC 1.008 07/05/2017 1355   PHURINE 5.0 07/05/2017 1355   GLUCOSEU NEGATIVE 07/05/2017 1355   GLUCOSEU NEGATIVE 04/05/2011 1129   HGBUR LARGE (A) 07/05/2017 1355   BILIRUBINUR NEGATIVE 07/05/2017 1355   KETONESUR NEGATIVE 07/05/2017 1355   PROTEINUR 30 (A) 07/05/2017 1355   UROBILINOGEN 0.2 04/05/2011 1129    NITRITE NEGATIVE 07/05/2017 1355   LEUKOCYTESUR MODERATE (A) 07/05/2017 1355    ----------------------------------------------------------------------------------------------------------------   Imaging Results:    Dg Chest Port 1 View  Result Date: 07/29/2017 CLINICAL DATA:  Encounter for shortness of breath. EXAM: PORTABLE CHEST 1 VIEW COMPARISON:  07/04/2017 FINDINGS: Stable changes from  prior cardiac surgery. Cardiac silhouette normal in size. No mediastinal or hilar masses. Lung volumes are low, particularly on the right where there is elevation of right hemidiaphragm. There is lung base opacity bilaterally consistent with atelectasis similar to the prior study. No evidence of pneumonia or pulmonary edema. No pneumothorax. Skeletal structures are grossly intact. IMPRESSION: No acute cardiopulmonary disease. Electronically Signed   By: Amie Portland M.D.   On: 07/29/2017 12:04   ST at 100, lad, poor R progression Q in v4-6     Assessment & Plan:    Principal Problem:   Acute hypoxemic respiratory failure (HCC) Active Problems:   Anemia   Renal insufficiency   Acute respiratory failure (HCC)   AMS likely due to hypercarbia Check CT brain r/o CVA  Acute respiratory failure, hypoxic, hypercarbic Bipap Check abg in an hour  Hcap Blood culture x2 Urine strep, urine legionella antigen Sputum gram stain/ culture vanco iv, cefepime iv pharmacy to dose  Tachycardia Tele Trop I q6h x3 Check tsh Check cardiac echo  Abnormal liver function Check RUQ ultrasound Check acute hepatitis panel May need to stop Lipitor if worsening  Renal insufficiency Check cmp in am  Anemia Check cbc in am  CAD/ CVA Cont lipitor 80mg  po qhs Cont metoprolol 25mg  1/2 po bid Cont plavix 75mg  po qday Cont Lasix 20mg  po qday  Dm2 Cont januvia 50mg  po qday fsbs ac and qhs, ISS  Neuropathy ? Cont gabapentin 100mg  po bid  Severe protein calorie malnutrition prostat 30mL po  bid   DVT Prophylaxis  Lovenox - SCDs  AM Labs Ordered, also please review Full Orders  Family Communication: Admission, patients condition and plan of care including tests being ordered have been discussed with the patient   who indicate understanding and agree with the plan and Code Status.  Code Status  FULL CODE  Likely DC to home  Condition GUARDED    Consults called: none  Admission status: inpatient  Time spent in minutes : 60   Pearson Grippe M.D on 07/29/2017 at 2:39 PM  Between 7am to 7pm - Pager - [phone]  After 7pm go to [email or web address] - password [facility]  [facility] - Office  [phone]

## 2017-07-29 NOTE — Progress Notes (Signed)
Pharmacy Antibiotic Note  Megan NielsenBeverley Atkinson is a 67 y.o. female admitted on 07/29/2017 with healthcare associated  pneumonia.  Pharmacy has been consulted for vancomycin dosing.WBC WNL and patient afebrile. Scr elevated at 1.64 with CrCl~ 50 ml/min.   Plan: Vancomycin 2500 mg X 1 Then 1750 mg every 24 hours  Monitor renal function, clinical improvement     Temp (24hrs), Avg:98.5 F (36.9 C), Min:98.5 F (36.9 C), Max:98.5 F (36.9 C)  Recent Labs  Lab 07/29/17 1119  WBC 8.5  CREATININE 1.64*    CrCl cannot be calculated (Unknown ideal weight.).    Allergies  Allergen Reactions  . Lisinopril     cough  . Metformin And Related Diarrhea    A lot of fluid loss from bowel and diarrhea    Antimicrobials this admission: Vanc 6/23>> Cefepime 6/23>>  Thank you for allowing pharmacy to be a part of this patient's care.  Sharin MonsEmily Kwasi Joung, PharmD, BCPS PGY2 Infectious Diseases Pharmacy Resident 07/29/2017 3:54 PM

## 2017-07-29 NOTE — ED Notes (Addendum)
Attempted to I&O patient for urine sample. Unable to advance catheter at urethra, pt reports pain.

## 2017-07-29 NOTE — ED Notes (Signed)
Purewick placed on pt. Pt informed of device and procedure, pt understood. RN notified.

## 2017-07-29 NOTE — Progress Notes (Signed)
CRITICAL VALUE ALERT  Critical Value:  Troponin 1.09  Date & Time Notied:  07/29/17 @ 1745  Provider Notified:  Pixie CasinoKim, J   Orders Received/Actions taken: no new orders at this time

## 2017-07-29 NOTE — Progress Notes (Signed)
Pt transported to CT via BIPAP with no complications noted. Pt remains stable at this time.

## 2017-07-29 NOTE — Consult Note (Signed)
[facility] Consult Note   Primary Physician: Pixie CasinoJ Kim Primary Cardiologist:  Olga MillersBrian Crenshaw  Reason for Consultation: Elevated troponin in the setting of hypoxia  HPI:    Megan NielsenBeverley Atkinson is a 67 year old female seen today for evaluation of elevated troponin at the request of Dr. Pixie CasinoJ Kim. She has a PMH significant for CAD (LHC 01/06/11: LAD 90-95%, mid D1 90%, proximal D2 50%, proximal OM 50%, RCA occluded) s/p CABG and LIMA to LAD in 2012, diastolic heart failure (EF 60-65%), cerebellar CVA 2018, obstructive sleep apnea, Diabetes mellitis, hypertension, hyperlipidemia, and morbid obesity (BMI >55 kg/m2). The patient is admitted for hypoxia and confusion from a skilled nursing facility. Her oxygen requirements had gone up progressively prior to presentation. She was recently treated from 07/04/17 to 07/08/17 for hypoxemic respiratory failure, pyelonephritis and cellulitis. She is currently not on a CPAP at her skilled nursing facility. Her initial workup reveals hypercapnia and hypoxemia. CT chest reveals chronic consolidation. ECG has only evidence of past infarcts no acute ischemia. With improved ventilation her mentation has improved slightly.   Review of Systems  Constitutional: Negative for chills and fever.  HENT: Negative for sinus pain.   Eyes: Negative for blurred vision and discharge.  Respiratory: Positive for shortness of breath.   Cardiovascular: Negative for chest pain and palpitations.  Gastrointestinal: Negative for heartburn.  Genitourinary: Negative for flank pain.  Musculoskeletal: Negative for myalgias.  Skin: Negative for rash.  Neurological: Negative for dizziness.  Psychiatric/Behavioral: The patient is not nervous/anxious.        Home Medications Prior to Admission medications   Medication Sig Start Date End Date Taking? Authorizing Provider  acetaminophen (TYLENOL) 325 MG tablet Take 650 mg by mouth every 6 (six) hours as needed.   Yes [provider]  Amino Acids-Protein Hydrolys (FEEDING SUPPLEMENT, PRO-STAT SUGAR FREE 64,) LIQD Take 30 mLs by mouth 3 (three) times daily with meals.   Yes [provider]  atorvastatin (LIPITOR) 80 MG tablet TAKE 1 TABLET BY MOUTH ONCE DAILY AT  6  PM 12/07/16  Yes Lewayne Buntingrenshaw, Brian S, MD  cefUROXime (CEFTIN) 500 MG tablet Take 500 mg by mouth 2 (two) times daily with a meal. For cellulitis 07/20/17 07/30/17 Yes [provider]  clopidogrel (PLAVIX) 75 MG tablet Take 1 tablet (75 mg total) by mouth daily. 12/07/16  Yes Lewayne Buntingrenshaw, Brian S, MD  furosemide (LASIX) 20 MG tablet Take 1 tablet (20 mg total) by mouth daily. 06/28/17  Yes Nche, Bonna Gainsharlotte Lum, NP  gabapentin (NEURONTIN) 100 MG capsule Take 100 mg by mouth 2 (two) times daily.   Yes [provider]  metoprolol tartrate (LOPRESSOR) 25 MG tablet Take 12.5 mg by mouth 2 (two) times daily.   Yes [provider]  pyridoxine (B-6) 100 MG tablet Take 100 mg by mouth daily.   Yes [provider]  senna-docusate (SENOKOT-S) 8.6-50 MG tablet Take 2 tablets by mouth 2 (two) times daily. Patient taking differently: Take 1 tablet by mouth at bedtime as needed for mild constipation or moderate constipation.  03/31/16  Yes Love, Evlyn KannerPamela S, PA-C  sitaGLIPtin (JANUVIA) 50 MG tablet Take 1 tablet (50 mg total) by mouth daily. 07/08/17  Yes Calvert Cantorizwan, Saima, MD  collagenase (SANTYL) ointment Apply topically daily. Patient not taking: Reported on 07/29/2017 07/09/17   Calvert Cantorizwan, Saima, MD  doxycycline (VIBRA-TABS) 100 MG tablet 100 mg 2 x day- Take for 3 more days Patient taking differently: Take 100 mg by mouth 2 (  two) times daily.  07/08/17   Calvert Cantor, MD  fluticasone (FLONASE) 50 MCG/ACT nasal spray Place 1 spray into both nostrils daily. Patient taking differently: Place 1 spray into both nostrils daily as needed for allergies.  04/01/16   Love, Evlyn Kanner, PA-C  miconazole (MICOTIN) 2 % powder Apply topically 2 times daily at 12 noon and 4  pm. Patient not taking: Reported on 07/29/2017 06/28/17   Nche, Bonna Gains, NP  polyvinyl alcohol (LIQUIFILM TEARS) 1.4 % ophthalmic solution Place 2 drops into both eyes as needed for dry eyes. Patient not taking: Reported on 07/29/2017 07/08/17   Calvert Cantor, MD  protein supplement shake (PREMIER PROTEIN) LIQD Take 325 mLs (11 oz total) by mouth 2 (two) times daily between meals. Patient not taking: Reported on 07/29/2017 07/08/17   Calvert Cantor, MD    Past Medical History: Past Medical History:  Diagnosis Date  . CAD (coronary artery disease)    small NSTEMI 11/12: LHC 01/06/11:  LAD 90-95%, mD1 90%, pD2 50%,  pOM1 50%, RCA occluded,  EF > 65%;  CABG  12/12: L-LAD  . Diabetes mellitus   . Glucose intolerance (impaired glucose tolerance)    Hemoglobin A1c 6.4 in 12/2010  . HTN (hypertension)    Echo 01/06/11: EF 65%  . Hyperlipidemia   . Obesity   . Stroke [facility]) 03/06/2016    Past Surgical History: Past Surgical History:  Procedure Laterality Date  . ABDOMINAL HYSTERECTOMY    . APPENDECTOMY    . CORONARY ARTERY BYPASS GRAFT  01/12/2011   Procedure: CORONARY ARTERY BYPASS GRAFTING (CABG);  Surgeon: Alleen Borne, MD;  Location: [facility] OR;  Service: Open Heart Surgery;  Laterality: N/A;  coronary artery bypass graft times one using left internal mammary artery . Attempted endoscopic saphenous vein harvest  . LEFT HEART CATHETERIZATION WITH CORONARY ANGIOGRAM N/A 01/06/2011   Procedure: LEFT HEART CATHETERIZATION WITH CORONARY ANGIOGRAM;  Surgeon: Rollene Rotunda, MD;  Location: [facility] CATH LAB;  Service: Cardiovascular;  Laterality: N/A;    Family History: Family History  Problem Relation Age of Onset  . Coronary artery disease Unknown        No family history  . Heart disease Mother   . Heart disease Father   . Heart disease Maternal Grandmother     Social History: Social History   Socioeconomic History  . Marital status: Single    Spouse name: Not on file  . Number of  children: 2  . Years of education: Not on file  . Highest education level: Not on file  Occupational History    Employer: [employer]  Social Needs  . Financial resource strain: Very hard  . Food insecurity:    Worry: Patient refused    Inability: Patient refused  . Transportation needs:    Medical: Patient refused    Non-medical: Patient refused  Tobacco Use  . Smoking status: Never Smoker  . Smokeless tobacco: Never Used  Substance and Sexual Activity  . Alcohol use: Yes    Comment: Occasional  . Drug use: No  . Sexual activity: Not Currently  Lifestyle  . Physical activity:    Days per week: Not on file    Minutes per session: Not on file  . Stress: Not on file  Relationships  . Social connections:    Talks on phone: Not on file    Gets together: Not on file    Attends religious service: Not on file    Active member of club or  organization: Not on file    Attends meetings of clubs or organizations: Not on file    Relationship status: Not on file  Other Topics Concern  . Not on file  Social History Narrative  . Not on file    Allergies:  Allergies  Allergen Reactions  . Lisinopril     cough  . Metformin And Related Diarrhea    A lot of fluid loss from bowel and diarrhea    Objective:    Vital Signs:   Temp:  [98.5 F (36.9 C)-98.6 F (37 C)] 98.6 F (37 C) (06/23 1711) Pulse Rate:  [66-106] 66 (06/23 1711) Resp:  [14-21] 18 (06/23 1711) BP: (109-129)/(56-71) 119/57 (06/23 1711) SpO2:  [96 %-100 %] 100 % (06/23 1711) Weight:  [160.2 kg (353 lb 2.8 oz)] 160.2 kg (353 lb 2.8 oz) (06/23 1554)    Weight change: Filed Weights   07/29/17 1554  Weight: (!) 160.2 kg (353 lb 2.8 oz)    Intake/Output:  No intake or output data in the 24 hours ending 07/29/17 1841    Physical Exam    General:  Tachypneic, is oriented to person, place and time HEENT: normal Neck: supple. JVP difficult to assess. No lymphadenopathy or thyromegaly appreciated. Cor: PMI  nondisplaced. Difficult to hear heart sounds, No rubs, gallops or murmurs. Lungs: Poor air entry bilaterally Abdomen: soft, nontender, nondistended. No hepatosplenomegaly. No bruits or masses. Good bowel sounds. Extremities: no cyanosis, clubbing, rash, edema Neuro: alert & orientedx3, cranial nerves grossly intact. moves all 4 extremities w/o difficulty. Affect pleasant    EKG    Sinus tachycardia  Labs   Basic Metabolic Panel: Recent Labs  Lab 07/29/17 1119  NA 145  K 4.8  CL 106  CO2 32  GLUCOSE 139*  BUN 31*  CREATININE 1.64*  CALCIUM 8.8*    Liver Function Tests: Recent Labs  Lab 07/29/17 1119  AST 53*  ALT 56*  ALKPHOS 96  BILITOT 0.2*  PROT 6.6  ALBUMIN 2.8*   Recent Labs  Lab 07/29/17 1119  LIPASE 22   No results for input(s): AMMONIA in the last 168 hours.  CBC: Recent Labs  Lab 07/29/17 1119  WBC 8.5  NEUTROABS 6.6  HGB 10.7*  HCT 38.5  MCV 104.9*  PLT 215    Cardiac Enzymes: Recent Labs  Lab 07/29/17 1625  TROPONINI 1.09*    BNP: BNP (last 3 results) Recent Labs    05/12/17 2023 07/04/17 1701 07/29/17 1119  BNP 59.1 113.0* 1,160.0*    ProBNP (last 3 results) Recent Labs    06/28/17 1247  PROBNP 39.0     CBG: Recent Labs  Lab 07/29/17 1703 07/29/17 1749  GLUCAP 59* 67    Coagulation Studies: No results for input(s): LABPROT, INR in the last 72 hours.   Imaging   Ct Chest Wo Contrast - Result Date: 07/29/2017 IMPRESSION: Chronic consolidation posteromedial right base likely atelectasis. Subtle patchy hazy opacification over the right lower lobe and central left lung which may be due to infection, mild edema or atelectasis. Atherosclerotic coronary artery disease.    Dg Chest Port 1 View - Result Date: 07/29/2017 FINDINGS: Stable changes from prior cardiac surgery. Cardiac silhouette normal in size. No mediastinal or hilar masses. Lung volumes are low, particularly on the right where there is elevation of  right hemidiaphragm. There is lung base opacity bilaterally consistent with atelectasis similar to the prior study. No evidence of pneumonia or pulmonary edema. No pneumothorax. Skeletal structures are  grossly intact. IMPRESSION: No acute cardiopulmonary disease.   US Abdomen Limited Ruq - Result Date: 07/29/2017 IMPRESSION: 1. Cholelithiasis without evidence of acute cholecystitis. 2. Borderline gallbladder wall thickening, possibly indicating mild chronic cholecystitis. Electronically Signed   By: Beckie Salts M.D.   On: 07/29/2017 18:27     Medications:     Current Medications: . [START ON 07/30/2017] atorvastatin  80 mg Oral q1800  . [START ON 07/30/2017] clopidogrel  75 mg Oral Daily  . enoxaparin (LOVENOX) injection  30 mg Subcutaneous Q24H  . feeding supplement (PRO-STAT SUGAR FREE 64)  30 mL Oral BID  . [START ON 07/30/2017] furosemide  20 mg Oral Daily  . gabapentin  100 mg Oral BID  . insulin aspart  0-5 Units Subcutaneous QHS  . insulin aspart  0-9 Units Subcutaneous TID WC  . [START ON 07/30/2017] linagliptin  5 mg Oral Daily  . metoprolol tartrate  12.5 mg Oral BID  . [START ON 07/30/2017] pyridoxine  100 mg Oral Daily  . sodium chloride flush  3 mL Intravenous Q12H     Infusions: . sodium chloride    . ceFEPime (MAXIPIME) IV 1 g (07/29/17 1700)  . [START ON 07/30/2017] vancomycin    . vancomycin 2,500 mg (07/29/17 1801)     Assessment/Plan   Megan Atkinson is a 67 year old female seen today for evaluation of elevated troponin and BNP in the setting of a suspected hypoxemic respiratory failure. She also has a significant history of CAD (LHC 01/06/11: LAD 90-95%, mid D1 90%, proximal D2 50%, proximal OM 50%, RCA occluded) s/p CABG and LIMA to LAD in 2012, diastolic heart failure (EF 60-65%), cerebellar CVA 2018, Diabetes mellitis, hypertension, hyperlipidemia, and morbid obesity (BMI >55 kg/m2).   1. Hypercapneic respiratory failure - The etiology is likely to be  secondary to obstructive sleep apnea and obesity hypoventilation. - Follow up with blood cultures. - Rule out over-sedation at the nursing facility  2. Acute on chronic diastolic heart failure The Echo from 05/13/2017 is as follows: - Left ventricle: The cavity size was normal. Wall thickness was   normal. Systolic function was vigorous. The estimated ejection   fraction was in the range of 65% to 70%. Wall motion was normal;   there were no regional wall motion abnormalities. Doppler   parameters are consistent with abnormal left ventricular   relaxation (grade 1 diastolic dysfunction). - Mitral valve: Calcified annulus. - Pulmonary arteries: Systolic pressure was mildly increased. PA   peak pressure: 45 mm Hg (S).   - The CXR does have increased vascular markings. - The BNP elevation (1160) is likely secondary to the diastolic dysfunction and pulmonary hypertension from long standing OSA - Agree with Lasix for now. Goal 0.5 to 1 liter net negative in 24 hours - Would diurese carefully since on a past admission the patient had a hyperdynamic LV with an intracavitary gradient. She will be preload dependent and excessive diuresis can precipitate low output state.  3. Coronary artery disease - She denies any chest pain - ECG reveals sinus tachycardia, LVH, old anterior and inferior infarcts. No evidence of active ischemia. - Troponin elevation is likely due to demand ischemia in the setting of profound hypoxia. There is likely un-revascularized CAD that could also be a substrate for injury. Would continue to cycle cardiac biomarkers till they plateau. - Continue Plavix, atorvastatin and metoprolol  4. Cerebellar CVA in 2018 - Continue Plavix  5. Morbid obesity (BMI >55 kg/m2) and obstructive sleep  apnea - Consider initiating CPAP when appropriate. Will defer to the primary team   Lonie Peak, MD  07/29/2017, 6:41 PM  Cardiology Overnight Team Please contact [facility] for  night-coverage after hours (4p -7a ) and weekends on [email or web address]

## 2017-07-30 ENCOUNTER — Inpatient Hospital Stay (HOSPITAL_COMMUNITY): Payer: Medicare Other

## 2017-07-30 DIAGNOSIS — J9601 Acute respiratory failure with hypoxia: Secondary | ICD-10-CM

## 2017-07-30 DIAGNOSIS — R748 Abnormal levels of other serum enzymes: Secondary | ICD-10-CM

## 2017-07-30 DIAGNOSIS — I361 Nonrheumatic tricuspid (valve) insufficiency: Secondary | ICD-10-CM

## 2017-07-30 LAB — COMPREHENSIVE METABOLIC PANEL
ALK PHOS: 79 U/L (ref 38–126)
ALT: 74 U/L — AB (ref 14–54)
ANION GAP: 6 (ref 5–15)
AST: 75 U/L — ABNORMAL HIGH (ref 15–41)
Albumin: 2.5 g/dL — ABNORMAL LOW (ref 3.5–5.0)
BUN: 36 mg/dL — ABNORMAL HIGH (ref 6–20)
CHLORIDE: 109 mmol/L (ref 101–111)
CO2: 31 mmol/L (ref 22–32)
CREATININE: 1.76 mg/dL — AB (ref 0.44–1.00)
Calcium: 8.6 mg/dL — ABNORMAL LOW (ref 8.9–10.3)
GFR calc non Af Amer: 28 mL/min — ABNORMAL LOW (ref >60)
GFR, EST AFRICAN AMERICAN: 33 mL/min — AB (ref >60)
Glucose, Bld: 96 mg/dL (ref 65–99)
Potassium: 4.7 mmol/L (ref 3.5–5.1)
SODIUM: 146 mmol/L — AB (ref 135–145)
Total Bilirubin: 0.5 mg/dL (ref 0.3–1.2)
Total Protein: 6.1 g/dL — ABNORMAL LOW (ref 6.5–8.1)

## 2017-07-30 LAB — ECHOCARDIOGRAM COMPLETE
HEIGHTINCHES: 67 in
WEIGHTICAEL: 5650.83 [oz_av]

## 2017-07-30 LAB — CBC
HCT: 33.4 % — ABNORMAL LOW (ref 36.0–46.0)
HEMOGLOBIN: 9.4 g/dL — AB (ref 12.0–15.0)
MCH: 29.7 pg (ref 26.0–34.0)
MCHC: 28.1 g/dL — ABNORMAL LOW (ref 30.0–36.0)
MCV: 105.7 fL — ABNORMAL HIGH (ref 78.0–100.0)
Platelets: 162 10*3/uL (ref 150–400)
RBC: 3.16 MIL/uL — AB (ref 3.87–5.11)
RDW: 15.8 % — ABNORMAL HIGH (ref 11.5–15.5)
WBC: 9 10*3/uL (ref 4.0–10.5)

## 2017-07-30 LAB — GLUCOSE, CAPILLARY
GLUCOSE-CAPILLARY: 80 mg/dL (ref 65–99)
GLUCOSE-CAPILLARY: 137 mg/dL — AB (ref 65–99)
Glucose-Capillary: 66 mg/dL (ref 65–99)
Glucose-Capillary: 79 mg/dL (ref 65–99)
Glucose-Capillary: 96 mg/dL (ref 65–99)
Glucose-Capillary: 92 mg/dL (ref 65–99)
Glucose-Capillary: 105 mg/dL — ABNORMAL HIGH (ref 65–99)

## 2017-07-30 LAB — TSH: TSH: 2.579 u[IU]/mL (ref 0.350–4.500)

## 2017-07-30 LAB — TROPONIN I: Troponin I: 1.33 ng/mL (ref <0.03)

## 2017-07-30 LAB — CREATININE, URINE, RANDOM: Creatinine, Urine: 105.87 mg/dL

## 2017-07-30 LAB — SODIUM, URINE, RANDOM: Sodium, Ur: 40 mmol/L

## 2017-07-30 MED ORDER — FUROSEMIDE 10 MG/ML IJ SOLN: 80.0000 mg | Freq: Once | INTRAMUSCULAR | Status: DC

## 2017-07-30 MED ORDER — LACTATED RINGERS IV SOLN
INTRAVENOUS | Status: AC
  Administered 2017-07-30 – 2017-07-31 (×2): via INTRAVENOUS

## 2017-07-30 MED ORDER — ENOXAPARIN SODIUM 80 MG/0.8ML ~~LOC~~ SOLN
80.0000 mg | SUBCUTANEOUS | Status: DC
  Administered 2017-07-30: 80 mg via SUBCUTANEOUS
  Filled 2017-07-30: qty 0.8

## 2017-07-30 NOTE — Plan of Care (Signed)
Patient remained on the BiPap throughout the night. RT assessed the patient several times throughout the nights to ensure the patient had proper tissue perfusion and respiratory function. The mask didn't fit well but did meet the patient's respiratory needs. Will continue to monitor for patient safety.

## 2017-07-30 NOTE — Progress Notes (Signed)
Pt placed on BIPAP. Mask secure. No breakdown noted on face

## 2017-07-30 NOTE — Progress Notes (Signed)
@IPLOG @        PROGRESS NOTE                                                                                                                                                                                                             Patient Demographics:    Megan Atkinson, is a 67 y.o. female, DOB - 10-18-1950, ZOX:[id number]  Admit date - 07/29/2017   Admitting Physician Pearson Grippe, MD  Outpatient Primary MD for the patient is Nche, Bonna Gains, NP  LOS - 1  Chief Complaint  Patient presents with  . Shortness of Breath       Brief Narrative  Megan Atkinson  is a 67 y.o. female, w hypertension, hyperlipidemia, cad, stroke, DM type II, morbid obeisity (BMI >40), OSA, (noncompliant with cpap), apparently presents with altered mental status with hypoxia and hypercapnia at SNF.  Also found to have slightly elevated BNP along with mildly elevated troponins, she was placed on BiPAP and admitted to the hospital for further care.  Initial running diagnosis was CHF/pneumonia.    Subjective:    Megan Atkinson today has, No headache, No chest pain, No abdominal pain - No Nausea, No new weakness tingling or numbness, No Cough - SOB.     Assessment  & Plan :     1.  Metabolic encephalopathy caused by acute hypercapnic and possible hypoxic respiratory failure.  ABGs were likely done on nonrebreather mask and do not reflect hypoxia, she is clearly noncompliant with her CPAP.  No evidence of pneumonia clinically, no cough fever or leukocytosis, clinically her edema is minimal and she says it is chronic, she always likes sleeping sitting up in the pattern has not changed, she denies any unknown weight gain recently.  After CPAP overnight she is remarkably better and close to her baseline, she has been counseled on compliance with CPAP, will stop antibiotics, withhold any further diuretics for now and monitor with supportive care.   2.  Morbid obesity with obstructive sleep apnea.  Noncompliant  with CPAP, counseled.  Follow with PCP for weight loss.  3.  History of CAD.  No acute issues.  No chest pain, EKG nonacute, troponin mildly elevated and non-ACS pattern with a flat troponin trend, cardiology on board, she is currently on combination of Plavix, statin for secondary prevention.  No beta-blocker due to low baseline blood pressures.    4.  Chronic diastolic CHF recent echogram shows a EF of around 60%.  Currently close to compensated.  5.  Mildly elevated  LFTs likely due to some right heart strain due to #1 above.  Bilirubin stable, right upper quadrant ultrasound stable, no abdominal pain.  Will monitor trend.  Currently holding off on diuretics.  6.  CKD 4.  Baseline creatinine close to 1.7 currently close to baseline.  7.  CVA.  Continue platelet and statin combination for secondary prevention.  No acute issues.  8.  Hypotension.  Due to intravascular depletion baseline blood pressures close to 150 systolic on a chronic basis, hold further diuretics and hypertensive medications gently hydrate and monitor.  9.  DM type II.  Continue sliding scale insulin and monitor CBGs QA CHS.  CBG (last 3)  Recent Labs    07/29/17 1703 07/29/17 1749 07/29/17 2029  GLUCAP 59* 67 80   Lab Results  Component Value Date   HGBA1C 8.0 (H) 05/13/2017      Diet :  Diet Order           Diet Carb Modified Fluid consistency: Thin; Room service appropriate? Yes; Fluid restriction: 1800 mL Fluid  Diet effective now           Family Communication  : None present  Code Status : Full  Disposition Plan  : SNF  Consults  : Cards  Procedures  :    Right upper quadrant ultrasound.  Nonspecific few gallstones.  CT head.  Nonacute  CT chest.  Possible mild right middle lobe atelectasis versus edema versus infiltrate.    DVT Prophylaxis  :  Lovenox   Lab Results  Component Value Date   PLT 162 07/30/2017    Inpatient Medications  Scheduled Meds: . atorvastatin  80 mg  Oral q1800  . clopidogrel  75 mg Oral Daily  . enoxaparin (LOVENOX) injection  30 mg Subcutaneous Q24H  . feeding supplement (PRO-STAT SUGAR FREE 64)  30 mL Oral BID  . gabapentin  100 mg Oral BID  . insulin aspart  0-5 Units Subcutaneous QHS  . insulin aspart  0-9 Units Subcutaneous TID WC  . linagliptin  5 mg Oral Daily  . metoprolol tartrate  12.5 mg Oral BID  . pyridoxine  100 mg Oral Daily  . sodium chloride flush  3 mL Intravenous Q12H   Continuous Infusions: . sodium chloride    . lactated ringers     PRN Meds:.sodium chloride, senna-docusate, sodium chloride flush  Antibiotics  :    Anti-infectives (From admission, onward)   Start     Dose/Rate Route Frequency Ordered Stop   07/30/17 1700  vancomycin (VANCOCIN) 1,750 mg in sodium chloride 0.9 % 500 mL IVPB  Status:  Discontinued     1,750 mg 250 mL/hr over 120 Minutes Intravenous Every 24 hours 07/29/17 1603 07/30/17 1013   07/29/17 1700  vancomycin (VANCOCIN) 2,500 mg in sodium chloride 0.9 % 500 mL IVPB     2,500 mg 250 mL/hr over 120 Minutes Intravenous  Once 07/29/17 1603 07/29/17 2001   07/29/17 1630  ceFEPIme (MAXIPIME) 1 g in sodium chloride 0.9 % 100 mL IVPB  Status:  Discontinued     1 g 200 mL/hr over 30 Minutes Intravenous Every 8 hours 07/29/17 1548 07/30/17 1013         Objective:   Vitals:   07/30/17 0329 07/30/17 0414 07/30/17 0622 07/30/17 0735  BP:  (!) 109/47  (!) 95/56  Pulse: 70 75  75  Resp: (!) 23 18 19 16   Temp:  98.3 F (36.8 C)  98.2 F (36.8 C)  TempSrc:  Axillary  Oral  SpO2: 100% 98% 96% 100%  Weight:      Height:        Wt Readings from Last 3 Encounters:  07/29/17 (!) 160.2 kg (353 lb 2.8 oz)  07/07/17 (!) 199.6 kg (440 lb)  05/13/17 (!) 168.7 kg (372 lb)     Intake/Output Summary (Last 24 hours) at 07/30/2017 1023 Last data filed at 07/30/2017 0800 Gross per 24 hour  Intake 909.17 ml  Output 200 ml  Net 709.17 ml     Physical Exam  Awake Alert, Oriented X 3, No  new F.N deficits, Normal affect Sunflower.AT,PERRAL Supple Neck,No JVD, No cervical lymphadenopathy appriciated.  Symmetrical Chest wall movement, Good air movement bilaterally,no rales RRR,No Gallops,Rubs or new Murmurs, No Parasternal Heave +ve B.Sounds, Abd Soft, No tenderness, No organomegaly appriciated, No rebound - guarding or rigidity. No Cyanosis, Clubbing, chronic trace edema, No new Rash or bruise      Data Review:    CBC Recent Labs  Lab 07/29/17 1119 07/30/17 0347  WBC 8.5 9.0  HGB 10.7* 9.4*  HCT 38.5 33.4*  PLT 215 162  MCV 104.9* 105.7*  MCH 29.2 29.7  MCHC 27.8* 28.1*  RDW 15.9* 15.8*  LYMPHSABS 1.0  --   MONOABS 0.8  --   EOSABS 0.0  --   BASOSABS 0.1  --     Chemistries  Recent Labs  Lab 07/29/17 1119 07/30/17 0347  NA 145 146*  K 4.8 4.7  CL 106 109  CO2 32 31  GLUCOSE 139* 96  BUN 31* 36*  CREATININE 1.64* 1.76*  CALCIUM 8.8* 8.6*  AST 53* 75*  ALT 56* 74*  ALKPHOS 96 79  BILITOT 0.2* 0.5   ------------------------------------------------------------------------------------------------------------------ No results for input(s): CHOL, HDL, LDLCALC, TRIG, CHOLHDL, LDLDIRECT in the last 72 hours.  Lab Results  Component Value Date   HGBA1C 8.0 (H) 05/13/2017   ------------------------------------------------------------------------------------------------------------------ Recent Labs    07/30/17 0347  TSH 2.579   ------------------------------------------------------------------------------------------------------------------ No results for input(s): VITAMINB12, FOLATE, FERRITIN, TIBC, IRON, RETICCTPCT in the last 72 hours.  Coagulation profile No results for input(s): INR, PROTIME in the last 168 hours.  No results for input(s): DDIMER in the last 72 hours.  Cardiac Enzymes Recent Labs  Lab 07/29/17 1625 07/29/17 2243 07/30/17 0347  TROPONINI 1.09* 1.69* 1.33*    ------------------------------------------------------------------------------------------------------------------    Component Value Date/Time   BNP 1,160.0 (H) 07/29/2017 1119    Micro Results Recent Results (from the past 240 hour(s))  Respiratory Panel by PCR     Status: None   Collection Time: 07/29/17  3:47 PM  Result Value Ref Range Status   Adenovirus NOT DETECTED NOT DETECTED Final   Coronavirus 229E NOT DETECTED NOT DETECTED Final   Coronavirus HKU1 NOT DETECTED NOT DETECTED Final   Coronavirus NL63 NOT DETECTED NOT DETECTED Final   Coronavirus OC43 NOT DETECTED NOT DETECTED Final   Metapneumovirus NOT DETECTED NOT DETECTED Final   Rhinovirus / Enterovirus NOT DETECTED NOT DETECTED Final   Influenza A NOT DETECTED NOT DETECTED Final   Influenza B NOT DETECTED NOT DETECTED Final   Parainfluenza Virus 1 NOT DETECTED NOT DETECTED Final   Parainfluenza Virus 2 NOT DETECTED NOT DETECTED Final   Parainfluenza Virus 3 NOT DETECTED NOT DETECTED Final   Parainfluenza Virus 4 NOT DETECTED NOT DETECTED Final   Respiratory Syncytial Virus NOT DETECTED NOT DETECTED Final   Bordetella pertussis NOT DETECTED NOT DETECTED Final   Chlamydophila pneumoniae NOT DETECTED  NOT DETECTED Final   Mycoplasma pneumoniae NOT DETECTED NOT DETECTED Final    Comment: Performed at [facility], [address] [place], [place] [address]  MRSA PCR Screening     Status: None   Collection Time: 07/29/17  3:47 PM  Result Value Ref Range Status   MRSA by PCR NEGATIVE NEGATIVE Final    Comment:        The GeneXpert MRSA Assay (FDA approved for NASAL specimens only), is one component of a comprehensive MRSA colonization surveillance program. It is not intended to diagnose MRSA infection nor to guide or monitor treatment for MRSA infections. Performed at [facility], [address] [place], [place] [address]     Radiology Reports Ct Head Wo Contrast  Result Date:  07/29/2017 CLINICAL DATA:  67 y/o  F; confusion. EXAM: CT HEAD WITHOUT CONTRAST TECHNIQUE: Contiguous axial images were obtained from the base of the skull through the vertex without intravenous contrast. COMPARISON:  05/14/2017 MRI of the head and 05/12/2017 CT of the head. FINDINGS: Brain: No evidence of acute infarction, hemorrhage, hydrocephalus, extra-axial collection or mass lesion/mass effect. Stable left inferior cerebellar chronic infarction. Stable mild chronic microvascular ischemic changes of the brain. Vascular: Calcific atherosclerosis of carotid siphons. No hyperdense vessel identified. Skull: Normal. Negative for fracture or focal lesion. Sinuses/Orbits: No acute finding. Other: None. IMPRESSION: 1. No acute intracranial abnormality identified. 2. Stable chronic left inferior cerebellar infarction. Electronically Signed   By: Mitzi HansenLance  Furusawa-Stratton M.D.   On: 07/29/2017 21:01   Ct Chest Wo Contrast  Result Date: 07/29/2017 CLINICAL DATA:  Shortness of breath and hypoxia. EXAM: CT CHEST WITHOUT CONTRAST TECHNIQUE: Multidetector CT imaging of the chest was performed following the standard protocol without IV contrast. COMPARISON:  07/05/2017 FINDINGS: Cardiovascular: Heart is normal in size. Minimal calcified plaque over the coronary arteries. Remaining vascular structures are unremarkable. Mediastinum/Nodes: No significant mediastinal or hilar adenopathy. Remaining mediastinal structures are unremarkable. Lungs/Pleura: Lungs are hypoinflated with moderate elevation the right hemidiaphragm. There is consolidation over the right lower lobe without significant change likely atelectasis. Subtle patchy hazy opacification within the right lower lobe and minimally over the central left lung which may be due to infection. Minimal linear atelectasis over the lingula. No effusion. Airways are normal. Upper Abdomen: No acute abnormality. Musculoskeletal: Degenerative changes spine. IMPRESSION: Chronic  consolidation posteromedial right base likely atelectasis. Subtle patchy hazy opacification over the right lower lobe and central left lung which may be due to infection, mild edema or atelectasis. Atherosclerotic coronary artery disease. Electronically Signed   By: Elberta Fortisaniel  Boyle M.D.   On: 07/29/2017 14:53   Ct Chest Wo Contrast  Result Date: 07/05/2017 CLINICAL DATA:  Inpatient. CHF. Hypoxia. Dyspnea. Altered mental status. EXAM: CT CHEST WITHOUT CONTRAST TECHNIQUE: Multidetector CT imaging of the chest was performed following the standard protocol without IV contrast. COMPARISON:  Chest radiograph from one day prior. FINDINGS: Cardiovascular: Mild cardiomegaly. Three-vessel coronary atherosclerosis status post CABG. No significant pericardial effusion/thickening. Atherosclerotic nonaneurysmal thoracic aorta. Top-normal main pulmonary artery (3.0 cm diameter). Mediastinum/Nodes: No discrete thyroid nodules. Unremarkable esophagus. No pathologically enlarged axillary, mediastinal or hilar lymph nodes, noting limited sensitivity for the detection of hilar adenopathy on this noncontrast study. Lungs/Pleura: No pneumothorax. No significant pleural effusion. Low lung volumes. Multiple geographic regions of consolidation with associated air bronchograms and volume loss in the mid to lower lungs bilaterally, most prominent in the right middle and right lower lobes. No discrete lung masses. No significant pulmonary  nodules. Chronic moderate elevation of the right hemidiaphragm. Upper abdomen: Cholelithiasis. Musculoskeletal: No aggressive appearing focal osseous lesions. Intact sternotomy wires. Moderate thoracic spondylosis. IMPRESSION: 1. Low lung volumes. Multiple geographic regions of consolidation with associated air bronchograms and volume loss in the bilateral mid to lower lungs, most prominent in the right middle and right lower lobes. Atelectasis is favored. Pneumonia is difficult to exclude particularly in  the right lower lobe. 2. Chronic elevation of the right hemidiaphragm compatible with eventration. 3. Mild cardiomegaly.  No significant pleural effusions. 4. Cholelithiasis. Aortic Atherosclerosis (ICD10-I70.0). Electronically Signed   By: Delbert Phenix M.D.   On: 07/05/2017 14:49   US Renal  Result Date: 07/04/2017 CLINICAL DATA:  Hematuria. EXAM: RENAL / URINARY TRACT ULTRASOUND COMPLETE COMPARISON:  None. FINDINGS: Right Kidney: Evaluation of the right kidney is markedly limited due to the patient's body habitus. Right kidney measures approximately 12 cm. Left Kidney: Evaluation of the left kidney is markedly limited due to the patient's body habitus. Left kidney measures approximately 10.6 cm. Bladder: Appears normal for degree of bladder distention. IMPRESSION: Evaluation is limited due to patient body habitus. No obvious abnormalities. Electronically Signed   By: Gerome Sam III M.D   On: 07/04/2017 23:05   Dg Chest Port 1 View  Result Date: 07/30/2017 CLINICAL DATA:  Shortness of breath. EXAM: PORTABLE CHEST 1 VIEW COMPARISON:  CT scan and chest x-ray dated 07/29/2017 and chest x-ray dated 05/12/2017 FINDINGS: Heart size and pulmonary vascularity are normal. Chronic elevation of the right hemidiaphragm. Slightly increased mild bilateral atelectasis at the bases and in the left midzone. No discrete effusions. No acute bone abnormality. Previous median sternotomy. IMPRESSION: Slight increased bilateral atelectasis. Electronically Signed   By: Francene Boyers M.D.   On: 07/30/2017 08:21   Dg Chest Port 1 View  Result Date: 07/29/2017 CLINICAL DATA:  Encounter for shortness of breath. EXAM: PORTABLE CHEST 1 VIEW COMPARISON:  07/04/2017 FINDINGS: Stable changes from prior cardiac surgery. Cardiac silhouette normal in size. No mediastinal or hilar masses. Lung volumes are low, particularly on the right where there is elevation of right hemidiaphragm. There is lung base opacity bilaterally consistent  with atelectasis similar to the prior study. No evidence of pneumonia or pulmonary edema. No pneumothorax. Skeletal structures are grossly intact. IMPRESSION: No acute cardiopulmonary disease. Electronically Signed   By: Amie Portland M.D.   On: 07/29/2017 12:04   Dg Chest Port 1 View  Result Date: 07/04/2017 CLINICAL DATA:  Hypoxia. Shortness of breath. Altered mental status. EXAM: PORTABLE CHEST 1 VIEW COMPARISON:  05/15/2017 and 05/14/2017 FINDINGS: Heart size is normal. There is slight pulmonary vascular prominence, most likely due to a shallow inspiration. Chronic elevation of the right hemidiaphragm. Previous median sternotomy. No significant bone abnormality. IMPRESSION: Accentuated vascularity most likely due to a shallow inspiration. Chronic elevation of the right hemidiaphragm. Electronically Signed   By: Francene Boyers M.D.   On: 07/04/2017 17:11   Ct Renal Stone Study  Result Date: 07/06/2017 CLINICAL DATA:  Hematuria EXAM: CT ABDOMEN AND PELVIS WITHOUT CONTRAST TECHNIQUE: Multidetector CT imaging of the abdomen and pelvis was performed following the standard protocol without IV contrast. COMPARISON:  None. FINDINGS: Lower chest: Multi segment collapse in the right middle and lower lobe. Coronary atherosclerosis. Hepatobiliary: 1 cm cystic density incidentally noted in the left liver.Cholelithiasis. No evidence of acute cholecystitis. Pancreas: Indistinct pancreas with cystic expansion of the lower head and uncinate process involving a nearly 4 cm area. Spleen: Unremarkable. Adrenals/Urinary Tract: Negative  adrenals. Innumerable calculi within the left intrarenal collecting system, which also contains gas ventrally. The left kidney is atrophic. No parenchymal gas. The bladder shows mild circumferential wall thickening with gas ventrally. Bowel loops are close to the dome of the bladder, without suspected fistula. Stomach/Bowel:  No obstruction. No inflammatory changes. Vascular/Lymphatic: No  acute vascular abnormality. No mass or adenopathy. Reproductive:Hysterectomy.  Negative adnexae. Other: No ascites or pneumoperitoneum. Musculoskeletal: No acute abnormalities. No acute or aggressive finding. Notable bilateral hip osteoarthritis. These results were called by telephone at the time of interpretation on 07/06/2017 at 1:07 pm to Dr. Calvert Cantor , who verbally acknowledged these results. IMPRESSION: 1. Gas in the left renal pelvis and urinary bladder. 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 2. History of hematuria with numerous calculi throughout the left kidney. Left renal atrophy. 3. Cystic masslike enlargement of the pancreas uncinate process. Recommend pancreas protocol MRI or CT with contrast after return to baseline renal function. 4. Multi segment right middle and lower lobe collapse. 5. Cholelithiasis. Electronically Signed   By: Marnee Spring M.D.   On: 07/06/2017 13:08   US Abdomen Limited Ruq  Result Date: 07/29/2017 CLINICAL DATA:  Abnormal liver function tests. EXAM: ULTRASOUND ABDOMEN LIMITED RIGHT UPPER QUADRANT COMPARISON:  Abdomen and pelvis CT dated 07/06/2017. FINDINGS: Gallbladder: Multiple gallstones in the gallbladder measuring up to 2.5 cm in maximum diameter each. Borderline gallbladder wall thickening with a maximum thickness of 3.0 mm. No pericholecystic fluid or sonographic Murphy sign. Common bile duct: Diameter: 3.3 mm Liver: No focal lesion identified. Within normal limits in parenchymal echogenicity. The recently demonstrated small cyst in the left lobe of the liver was not visualized at ultrasound today. Portal vein is patent on color Doppler imaging with normal direction of blood flow towards the liver. IMPRESSION: 1. Cholelithiasis without evidence of acute cholecystitis. 2. Borderline gallbladder wall thickening, possibly indicating mild chronic cholecystitis. Electronically Signed   By: Beckie Salts M.D.   On: 07/29/2017 18:27    Time Spent in minutes  30   Susa Raring M.D on 07/30/2017 at 10:23 AM  Between 7am to 7pm - Pager - [phone] ( page via [email or web address], text pages only, please mention full 10 digit call back number). After 7pm go to [email or web address] - password [email or web address]

## 2017-07-30 NOTE — Progress Notes (Signed)
**Note Megan Atkinson-Identified via Obfuscation** Patient removed BiPAP and became very aggressive with this RN when instructed to put mask on. Patient exclaimed, "You don't understand, I don't need this. I don't want to wear this". Patient's oxygen level decreased to 84% after she removed the BiPAP mask. Patient educated on importance of wearing mask to improve oxygen level/breathing status. Patient finally agreed to wear BiPAP mask. Blood glucose checked and results 66. D50 given x1 for hypoglycemia. On-call MD notified of event (see new orders). Oncoming RN notified of hypoglycemia and need for reassessment of blood glucose level. Fall/safety precautions implemented.

## 2017-07-30 NOTE — Progress Notes (Signed)
Patient has been running low blood sugars throughout the night. Off going RN gave patient Dextrose and notified covering practitioner. An order was given to start D5W @ 50 ml/hr. Checked patient's BS within an hour of receiving Dextrose from reporting RN. Patient's BS initially increased but has started to decrease again. It is currently at [id number]. Notified NP [place]. Awaiting a response. Will continue to monitor patient for safety.

## 2017-07-30 NOTE — Progress Notes (Signed)
  Echocardiogram 2D Echocardiogram has been performed.  Megan Atkinson, Megan Atkinson 07/30/2017, 3:59 PM

## 2017-07-30 NOTE — Progress Notes (Signed)
Progress Note  Patient Name: Megan NielsenBeverley Atkinson Date of Encounter: 07/30/2017  Primary Cardiologist: Dr Jens Somrenshaw  Subjective   No chest pain or dyspnea  Inpatient Medications    Scheduled Meds: . atorvastatin  80 mg Oral q1800  . clopidogrel  75 mg Oral Daily  . enoxaparin (LOVENOX) injection  30 mg Subcutaneous Q24H  . feeding supplement (PRO-STAT SUGAR FREE 64)  30 mL Oral BID  . gabapentin  100 mg Oral BID  . insulin aspart  0-5 Units Subcutaneous QHS  . insulin aspart  0-9 Units Subcutaneous TID WC  . linagliptin  5 mg Oral Daily  . metoprolol tartrate  12.5 mg Oral BID  . pyridoxine  100 mg Oral Daily  . sodium chloride flush  3 mL Intravenous Q12H   Continuous Infusions: . sodium chloride    . lactated ringers     PRN Meds: sodium chloride, senna-docusate, sodium chloride flush   Vital Signs    Vitals:   07/30/17 0329 07/30/17 0414 07/30/17 0622 07/30/17 0735  BP:  (!) 109/47  (!) 95/56  Pulse: 70 75  75  Resp: (!) 23 18 19 16   Temp:  98.3 F (36.8 C)  98.2 F (36.8 C)  TempSrc:  Axillary  Oral  SpO2: 100% 98% 96% 100%  Weight:      Height:        Intake/Output Summary (Last 24 hours) at 07/30/2017 1037 Last data filed at 07/30/2017 0800 Gross per 24 hour  Intake 909.17 ml  Output 200 ml  Net 709.17 ml   Filed Weights   07/29/17 1554  Weight: (!) 353 lb 2.8 oz (160.2 kg)    Telemetry    Sinus - Personally Reviewed  Physical Exam   GEN: No acute distress.  Morbidly obese Neck: No JVD Cardiac: RRR, no murmurs, rubs, or gallops.  Respiratory: Clear to auscultation bilaterally. GI: Soft, nontender, non-distended  MS: No edema Neuro:  Nonfocal  Psych: Normal affect   Labs    Chemistry Recent Labs  Lab 07/29/17 1119 07/30/17 0347  NA 145 146*  K 4.8 4.7  CL 106 109  CO2 32 31  GLUCOSE 139* 96  BUN 31* 36*  CREATININE 1.64* 1.76*  CALCIUM 8.8* 8.6*  PROT 6.6 6.1*  ALBUMIN 2.8* 2.5*  AST 53* 75*  ALT 56* 74*  ALKPHOS 96 79    BILITOT 0.2* 0.5  GFRNONAA 31* 28*  GFRAA 36* 33*  ANIONGAP 7 6     Hematology Recent Labs  Lab 07/29/17 1119 07/30/17 0347  WBC 8.5 9.0  RBC 3.67* 3.16*  HGB 10.7* 9.4*  HCT 38.5 33.4*  MCV 104.9* 105.7*  MCH 29.2 29.7  MCHC 27.8* 28.1*  RDW 15.9* 15.8*  PLT 215 162    Cardiac Enzymes Recent Labs  Lab 07/29/17 1625 07/29/17 2243 07/30/17 0347  TROPONINI 1.09* 1.69* 1.33*    BNP Recent Labs  Lab 07/29/17 1119  BNP 1,160.0*     Radiology    Ct Head Wo Contrast  Result Date: 07/29/2017 CLINICAL DATA:  67 y/o  F; confusion. EXAM: CT HEAD WITHOUT CONTRAST TECHNIQUE: Contiguous axial images were obtained from the base of the skull through the vertex without intravenous contrast. COMPARISON:  05/14/2017 MRI of the head and 05/12/2017 CT of the head. FINDINGS: Brain: No evidence of acute infarction, hemorrhage, hydrocephalus, extra-axial collection or mass lesion/mass effect. Stable left inferior cerebellar chronic infarction. Stable mild chronic microvascular ischemic changes of the brain. Vascular: Calcific atherosclerosis of carotid  siphons. No hyperdense vessel identified. Skull: Normal. Negative for fracture or focal lesion. Sinuses/Orbits: No acute finding. Other: None. IMPRESSION: 1. No acute intracranial abnormality identified. 2. Stable chronic left inferior cerebellar infarction. Electronically Signed   By: Mitzi Hansen M.D.   On: 07/29/2017 21:01   Ct Chest Wo Contrast  Result Date: 07/29/2017 CLINICAL DATA:  Shortness of breath and hypoxia. EXAM: CT CHEST WITHOUT CONTRAST TECHNIQUE: Multidetector CT imaging of the chest was performed following the standard protocol without IV contrast. COMPARISON:  07/05/2017 FINDINGS: Cardiovascular: Heart is normal in size. Minimal calcified plaque over the coronary arteries. Remaining vascular structures are unremarkable. Mediastinum/Nodes: No significant mediastinal or hilar adenopathy. Remaining mediastinal  structures are unremarkable. Lungs/Pleura: Lungs are hypoinflated with moderate elevation the right hemidiaphragm. There is consolidation over the right lower lobe without significant change likely atelectasis. Subtle patchy hazy opacification within the right lower lobe and minimally over the central left lung which may be due to infection. Minimal linear atelectasis over the lingula. No effusion. Airways are normal. Upper Abdomen: No acute abnormality. Musculoskeletal: Degenerative changes spine. IMPRESSION: Chronic consolidation posteromedial right base likely atelectasis. Subtle patchy hazy opacification over the right lower lobe and central left lung which may be due to infection, mild edema or atelectasis. Atherosclerotic coronary artery disease. Electronically Signed   By: Elberta Fortis M.D.   On: 07/29/2017 14:53   Dg Chest Port 1 View  Result Date: 07/30/2017 CLINICAL DATA:  Shortness of breath. EXAM: PORTABLE CHEST 1 VIEW COMPARISON:  CT scan and chest x-ray dated 07/29/2017 and chest x-ray dated 05/12/2017 FINDINGS: Heart size and pulmonary vascularity are normal. Chronic elevation of the right hemidiaphragm. Slightly increased mild bilateral atelectasis at the bases and in the left midzone. No discrete effusions. No acute bone abnormality. Previous median sternotomy. IMPRESSION: Slight increased bilateral atelectasis. Electronically Signed   By: Francene Boyers M.D.   On: 07/30/2017 08:21   Dg Chest Port 1 View  Result Date: 07/29/2017 CLINICAL DATA:  Encounter for shortness of breath. EXAM: PORTABLE CHEST 1 VIEW COMPARISON:  07/04/2017 FINDINGS: Stable changes from prior cardiac surgery. Cardiac silhouette normal in size. No mediastinal or hilar masses. Lung volumes are low, particularly on the right where there is elevation of right hemidiaphragm. There is lung base opacity bilaterally consistent with atelectasis similar to the prior study. No evidence of pneumonia or pulmonary edema. No  pneumothorax. Skeletal structures are grossly intact. IMPRESSION: No acute cardiopulmonary disease. Electronically Signed   By: Amie Portland M.D.   On: 07/29/2017 12:04   US Abdomen Limited Ruq  Result Date: 07/29/2017 CLINICAL DATA:  Abnormal liver function tests. EXAM: ULTRASOUND ABDOMEN LIMITED RIGHT UPPER QUADRANT COMPARISON:  Abdomen and pelvis CT dated 07/06/2017. FINDINGS: Gallbladder: Multiple gallstones in the gallbladder measuring up to 2.5 cm in maximum diameter each. Borderline gallbladder wall thickening with a maximum thickness of 3.0 mm. No pericholecystic fluid or sonographic Murphy sign. Common bile duct: Diameter: 3.3 mm Liver: No focal lesion identified. Within normal limits in parenchymal echogenicity. The recently demonstrated small cyst in the left lobe of the liver was not visualized at ultrasound today. Portal vein is patent on color Doppler imaging with normal direction of blood flow towards the liver. IMPRESSION: 1. Cholelithiasis without evidence of acute cholecystitis. 2. Borderline gallbladder wall thickening, possibly indicating mild chronic cholecystitis. Electronically Signed   By: Beckie Salts M.D.   On: 07/29/2017 18:27    Patient Profile     67 y.o. female with past medical history  of coronary artery disease status post coronary artery bypass and graft, chronic diastolic congestive heart failure, hypertension, hyperlipidemia, prior CVA, diabetes mellitus admitted with respiratory failure.  Cardiology asked to evaluate for elevated troponin.  Last echocardiogram April 2019 showed vigorous LV function.  Assessment & Plan    1 elevated troponin-patient without cardiac symptoms.  She denies dyspnea or chest pain.  Recent echocardiogram shows vigorous LV function.  Would not pursue further ischemia evaluation.  Possibly secondary to demand ischemia.  2 acute respiratory failure-improved this morning.  Felt possibly secondary to obesity hypoventilation syndrome and sleep  apnea.  Also being treated for possible pneumonia.  3 acute on chronic diastolic congestive heart failure-BNP elevated at time of admission.  However does not appear to be markedly volume overloaded based on physical exam though given obesity this is somewhat difficult.  We will hold on further diuresis for now.  Patient also with history of LVOT outflow gradient.  Continue metoprolol.  4 coronary artery disease status post coronary artery bypass and graft-continue Plavix and statin.  5 morbid obesity-needs weight loss.  6 prior cerebellar CVA-continue Plavix.  7 acute stage III kidney disease-hold diuretics and follow.  For questions or updates, please contact [facility] Please consult [email or web address] for contact info under Cardiology/STEMI.      Signed, Olga Millers, MD  07/30/2017, 10:37 AM

## 2017-07-31 DIAGNOSIS — M6281 Muscle weakness (generalized): Secondary | ICD-10-CM | POA: Diagnosis not present

## 2017-07-31 DIAGNOSIS — J9621 Acute and chronic respiratory failure with hypoxia: Secondary | ICD-10-CM | POA: Diagnosis not present

## 2017-07-31 DIAGNOSIS — J189 Pneumonia, unspecified organism: Secondary | ICD-10-CM | POA: Diagnosis not present

## 2017-07-31 DIAGNOSIS — N184 Chronic kidney disease, stage 4 (severe): Secondary | ICD-10-CM | POA: Diagnosis not present

## 2017-07-31 DIAGNOSIS — I5032 Chronic diastolic (congestive) heart failure: Secondary | ICD-10-CM | POA: Diagnosis not present

## 2017-07-31 DIAGNOSIS — R5383 Other fatigue: Secondary | ICD-10-CM | POA: Diagnosis not present

## 2017-07-31 DIAGNOSIS — R0989 Other specified symptoms and signs involving the circulatory and respiratory systems: Secondary | ICD-10-CM | POA: Diagnosis not present

## 2017-07-31 DIAGNOSIS — R05 Cough: Secondary | ICD-10-CM | POA: Diagnosis not present

## 2017-07-31 DIAGNOSIS — L97219 Non-pressure chronic ulcer of right calf with unspecified severity: Secondary | ICD-10-CM | POA: Diagnosis not present

## 2017-07-31 DIAGNOSIS — D631 Anemia in chronic kidney disease: Secondary | ICD-10-CM | POA: Diagnosis not present

## 2017-07-31 DIAGNOSIS — G9341 Metabolic encephalopathy: Secondary | ICD-10-CM | POA: Diagnosis not present

## 2017-07-31 DIAGNOSIS — Z7401 Bed confinement status: Secondary | ICD-10-CM | POA: Diagnosis not present

## 2017-07-31 DIAGNOSIS — E1140 Type 2 diabetes mellitus with diabetic neuropathy, unspecified: Secondary | ICD-10-CM | POA: Diagnosis not present

## 2017-07-31 DIAGNOSIS — R0602 Shortness of breath: Secondary | ICD-10-CM | POA: Diagnosis not present

## 2017-07-31 DIAGNOSIS — J9601 Acute respiratory failure with hypoxia: Secondary | ICD-10-CM | POA: Diagnosis not present

## 2017-07-31 DIAGNOSIS — I959 Hypotension, unspecified: Secondary | ICD-10-CM | POA: Diagnosis not present

## 2017-07-31 DIAGNOSIS — I110 Hypertensive heart disease with heart failure: Secondary | ICD-10-CM | POA: Diagnosis not present

## 2017-07-31 DIAGNOSIS — N390 Urinary tract infection, site not specified: Secondary | ICD-10-CM | POA: Diagnosis not present

## 2017-07-31 DIAGNOSIS — G464 Cerebellar stroke syndrome: Secondary | ICD-10-CM | POA: Diagnosis not present

## 2017-07-31 DIAGNOSIS — R748 Abnormal levels of other serum enzymes: Secondary | ICD-10-CM | POA: Diagnosis not present

## 2017-07-31 DIAGNOSIS — I10 Essential (primary) hypertension: Secondary | ICD-10-CM | POA: Diagnosis not present

## 2017-07-31 DIAGNOSIS — I259 Chronic ischemic heart disease, unspecified: Secondary | ICD-10-CM | POA: Diagnosis not present

## 2017-07-31 DIAGNOSIS — R262 Difficulty in walking, not elsewhere classified: Secondary | ICD-10-CM | POA: Diagnosis not present

## 2017-07-31 DIAGNOSIS — L0390 Cellulitis, unspecified: Secondary | ICD-10-CM | POA: Diagnosis not present

## 2017-07-31 DIAGNOSIS — I509 Heart failure, unspecified: Secondary | ICD-10-CM | POA: Diagnosis not present

## 2017-07-31 DIAGNOSIS — I636 Cerebral infarction due to cerebral venous thrombosis, nonpyogenic: Secondary | ICD-10-CM | POA: Diagnosis not present

## 2017-07-31 DIAGNOSIS — M2550 Pain in unspecified joint: Secondary | ICD-10-CM | POA: Diagnosis not present

## 2017-07-31 DIAGNOSIS — I5033 Acute on chronic diastolic (congestive) heart failure: Secondary | ICD-10-CM | POA: Diagnosis not present

## 2017-07-31 DIAGNOSIS — I639 Cerebral infarction, unspecified: Secondary | ICD-10-CM | POA: Diagnosis not present

## 2017-07-31 DIAGNOSIS — I2510 Atherosclerotic heart disease of native coronary artery without angina pectoris: Secondary | ICD-10-CM | POA: Diagnosis not present

## 2017-07-31 LAB — CBC
HEMATOCRIT: 31.6 % — AB (ref 36.0–46.0)
HEMOGLOBIN: 8.8 g/dL — AB (ref 12.0–15.0)
MCH: 29.4 pg (ref 26.0–34.0)
MCHC: 27.8 g/dL — ABNORMAL LOW (ref 30.0–36.0)
MCV: 105.7 fL — ABNORMAL HIGH (ref 78.0–100.0)
Platelets: 138 10*3/uL — ABNORMAL LOW (ref 150–400)
RBC: 2.99 MIL/uL — AB (ref 3.87–5.11)
RDW: 15.9 % — ABNORMAL HIGH (ref 11.5–15.5)
WBC: 7.1 10*3/uL (ref 4.0–10.5)

## 2017-07-31 LAB — COMPREHENSIVE METABOLIC PANEL
ALBUMIN: 2.4 g/dL — AB (ref 3.5–5.0)
ALT: 69 U/L — AB (ref 0–44)
AST: 50 U/L — AB (ref 15–41)
Alkaline Phosphatase: 80 U/L (ref 38–126)
Anion gap: 4 — ABNORMAL LOW (ref 5–15)
BILIRUBIN TOTAL: 0.5 mg/dL (ref 0.3–1.2)
BUN: 33 mg/dL — AB (ref 8–23)
CO2: 33 mmol/L — ABNORMAL HIGH (ref 22–32)
CREATININE: 1.34 mg/dL — AB (ref 0.44–1.00)
Calcium: 8.4 mg/dL — ABNORMAL LOW (ref 8.9–10.3)
Chloride: 105 mmol/L (ref 98–111)
GFR, EST AFRICAN AMERICAN: 46 mL/min — AB (ref >60)
GFR, EST NON AFRICAN AMERICAN: 39 mL/min — AB (ref >60)
Glucose, Bld: 106 mg/dL — ABNORMAL HIGH (ref 70–99)
Potassium: 4.6 mmol/L (ref 3.5–5.1)
Sodium: 142 mmol/L (ref 135–145)
Total Protein: 5.8 g/dL — ABNORMAL LOW (ref 6.5–8.1)

## 2017-07-31 LAB — HEPATITIS PANEL, ACUTE
HCV Ab: <0.1 s/co ratio (ref 0.0–0.9)
HEP B S AG: NEGATIVE
Hep A IgM: NEGATIVE
Hep B C IgM: NEGATIVE

## 2017-07-31 LAB — GLUCOSE, CAPILLARY
GLUCOSE-CAPILLARY: 117 mg/dL — AB (ref 70–99)
GLUCOSE-CAPILLARY: 92 mg/dL (ref 70–99)
Glucose-Capillary: 78 mg/dL (ref 70–99)
Glucose-Capillary: 116 mg/dL — ABNORMAL HIGH (ref 70–99)

## 2017-07-31 LAB — MAGNESIUM: MAGNESIUM: 2 mg/dL (ref 1.7–2.4)

## 2017-07-31 NOTE — Discharge Instructions (Signed)
Follow with Primary MD Nche, Bonna Gainsharlotte Lum, NP in 7 days   Must wear CPAP whenever you are sleeping day or night without fail.  Get CBC, CMP, 2 view Chest X ray checked  by Primary MD or SNF MD in 5-7 days   Activity: As tolerated with Full fall precautions use walker/cane & assistance as needed  Disposition SNF  Diet: Low carbohydrate heart healthy diet with strict 1.5 L/day total fluid restriction.  Accuchecks 4 times/day, Once in AM empty stomach and then before each meal. Log in all results and show them to your Prim.MD in 3 days. If any glucose reading is under 80 or above 300 call your Prim MD immidiately. Follow Low glucose instructions for glucose under 80 as instructed.  Special Instructions: If you have smoked or chewed Tobacco  in the last 2 yrs please stop smoking, stop any regular Alcohol  and or any Recreational drug use.  On your next visit with your primary care physician please Get Medicines reviewed and adjusted.  Please request your Prim.MD to go over all Hospital Tests and Procedure/Radiological results at the follow up, please get all Hospital records sent to your Prim MD by signing hospital release before you go home.  If you experience worsening of your admission symptoms, develop shortness of breath, life threatening emergency, suicidal or homicidal thoughts you must seek medical attention immediately by calling 911 or calling your MD immediately  if symptoms less severe.  You Must read complete instructions/literature along with all the possible adverse reactions/side effects for all the Medicines you take and that have been prescribed to you. Take any new Medicines after you have completely understood and accpet all the possible adverse reactions/side effects.

## 2017-07-31 NOTE — Progress Notes (Signed)
Patient has requested to take the NIV off at this time. She was placed on her nasal cannula and is tolerating well.

## 2017-07-31 NOTE — NC FL2 (Signed)
[place] MEDICAID FL2 LEVEL OF CARE SCREENING TOOL     IDENTIFICATION  Patient Name: Megan Atkinson Birthdate: [birth year] Sex: female Admission Date (Current Location): 07/29/2017  [facility] [place]   Facility and Address:  [facility] [place], [address], [place], [place] [address]      Provider Number: [id number]  Attending Physician Name and Address:  Leroy SeaSingh, Prashant K, MD  Relative Name and Phone Number:       Current Level of Care: Hospital Recommended Level of Care: Skilled Nursing Facility Prior Approval Number:    Date Approved/Denied:   PASRR Number:    Discharge Plan: SNF    Current Diagnoses: Patient Active Problem List   Diagnosis Date Noted  . Renal insufficiency 07/29/2017  . Acute respiratory failure (HCC) 07/29/2017  . Pancreatic mass- needs MRI 07/08/2017  . Emphysematous pyelonephritis of left kidney 07/06/2017  . Acute diastolic heart failure (HCC)   . Acute hypoxemic respiratory failure (HCC) 07/05/2017  . Functional urinary incontinence 06/28/2017  . Mycotic toenails 06/28/2017  . Intertrigo 06/28/2017  . Bilateral leg edema 06/28/2017  . Cerebral thrombosis with cerebral infarction 05/16/2017  . SIRS (systemic inflammatory response syndrome) (HCC) 05/13/2017  . Acute lower UTI 05/13/2017  . Acute kidney injury (HCC) 05/12/2017  . Elevated troponin 05/12/2017  . Dehydration 05/12/2017  . Hypernatremia 05/12/2017  . Elevated CK 05/12/2017  . Prolonged QT interval 05/12/2017  . Leukocytosis 05/12/2017  . Acute renal failure (ARF) (HCC) 05/12/2017  . Acute blood loss anemia   . Elevated serum creatinine   . Slow transit constipation   . Benign essential HTN   . Generalized anxiety disorder   . Gait disturbance, post-stroke 03/13/2016  . Cerebellar stroke, acute (HCC) 03/10/2016  . Super obese   . Prediabetes   . Ataxia, post-stroke   . Acute cystitis without hematuria   . CVA (cerebral  vascular accident) (HCC) 03/06/2016  . Morbid obesity (HCC) 02/29/2016  . OSA (obstructive sleep apnea) 11/24/2014  . Dyspnea 10/02/2014  . Anemia 04/05/2011  . Urinary tract infection, site not specified 04/05/2011  . DM (diabetes mellitus), type 2, uncontrolled (HCC) 02/22/2011  . Hyperlipidemia 02/22/2011  . CAD (coronary artery disease) 01/07/2011  . Hypertension 01/07/2011    Orientation RESPIRATION BLADDER Height & Weight     Self, Time, Situation, Place  O2(3L ) Incontinent Weight: (!) 353 lb 2.8 oz (160.2 kg) Height:  5\' 7"  (170.2 cm)  BEHAVIORAL SYMPTOMS/MOOD NEUROLOGICAL BOWEL NUTRITION STATUS      Continent Diet(see DC summary)  AMBULATORY STATUS COMMUNICATION OF NEEDS Skin   Extensive Assist Verbally Normal                       Personal Care Assistance Level of Assistance  Bathing, Dressing Bathing Assistance: Maximum assistance   Dressing Assistance: Maximum assistance     Functional Limitations Info             SPECIAL CARE FACTORS FREQUENCY  PT (By licensed PT), OT (By licensed OT)     PT Frequency: 5/wk OT Frequency: 5/wk            Contractures      Additional Factors Info  Code Status, Allergies Code Status Info: FULL Allergies Info: NKA           Current Medications (07/31/2017):  This is the current hospital active medication list Current Facility-Administered Medications  Medication Dose Route Frequency Provider Last Rate Last  Dose  . 0.9 %  sodium chloride infusion  250 mL Intravenous PRN Pearson Grippe, MD      . atorvastatin (LIPITOR) tablet 80 mg  80 mg Oral q1800 Pearson Grippe, MD   80 mg at 07/30/17 1810  . clopidogrel (PLAVIX) tablet 75 mg  75 mg Oral Daily Pearson Grippe, MD   75 mg at 07/31/17 1031  . enoxaparin (LOVENOX) injection 80 mg  80 mg Subcutaneous Q24H Leroy Sea, MD   80 mg at 07/30/17 1809  . feeding supplement (PRO-STAT SUGAR FREE 64) liquid 30 mL  30 mL Oral BID Pearson Grippe, MD   30 mL at 07/31/17 1030  .  gabapentin (NEURONTIN) capsule 100 mg  100 mg Oral BID Pearson Grippe, MD   100 mg at 07/31/17 1031  . insulin aspart (novoLOG) injection 0-5 Units  0-5 Units Subcutaneous QHS Pearson Grippe, MD      . insulin aspart (novoLOG) injection 0-9 Units  0-9 Units Subcutaneous TID WC Pearson Grippe, MD   1 Units at 07/30/17 1157  . linagliptin (TRADJENTA) tablet 5 mg  5 mg Oral Daily Pearson Grippe, MD   5 mg at 07/31/17 1031  . metoprolol tartrate (LOPRESSOR) tablet 12.5 mg  12.5 mg Oral BID Pearson Grippe, MD   12.5 mg at 07/31/17 1031  . pyridOXINE (VITAMIN B-6) tablet 100 mg  100 mg Oral Daily Pearson Grippe, MD   100 mg at 07/31/17 1030  . senna-docusate (Senokot-S) tablet 1 tablet  1 tablet Oral QHS PRN Pearson Grippe, MD      . sodium chloride flush (NS) 0.9 % injection 3 mL  3 mL Intravenous Q12H Pearson Grippe, MD   3 mL at 07/31/17 1031  . sodium chloride flush (NS) 0.9 % injection 3 mL  3 mL Intravenous PRN Pearson Grippe, MD         Discharge Medications: Please see discharge summary for a list of discharge medications.  Relevant Imaging Results:  Relevant Lab Results:   Additional Information SS#: [id number]  Burna Sis, LCSW

## 2017-07-31 NOTE — Progress Notes (Signed)
Patient to be [facility] to SNF [facility] Transported by [facility], pt was stable at time of discharge. Nurse called and gave report to the nurse accepting the pt, the nurse had no further questions after given report. [facility] Pts belongings returned upon discharge.  Blane OharaSavannah Jilian West

## 2017-07-31 NOTE — Progress Notes (Signed)
Progress Note  Patient Name: Megan Atkinson Date of Encounter: 07/31/2017  Primary Cardiologist: Dr Jens Som  Subjective   Pt denies CP or dyspnea  Inpatient Medications    Scheduled Meds: . atorvastatin  80 mg Oral q1800  . clopidogrel  75 mg Oral Daily  . enoxaparin (LOVENOX) injection  80 mg Subcutaneous Q24H  . feeding supplement (PRO-STAT SUGAR FREE 64)  30 mL Oral BID  . gabapentin  100 mg Oral BID  . insulin aspart  0-5 Units Subcutaneous QHS  . insulin aspart  0-9 Units Subcutaneous TID WC  . linagliptin  5 mg Oral Daily  . metoprolol tartrate  12.5 mg Oral BID  . pyridoxine  100 mg Oral Daily  . sodium chloride flush  3 mL Intravenous Q12H   Continuous Infusions: . sodium chloride    . lactated ringers 75 mL/hr at 07/31/17 0102   PRN Meds: sodium chloride, senna-docusate, sodium chloride flush   Vital Signs    Vitals:   07/30/17 2318 07/31/17 0054 07/31/17 0418 07/31/17 0742  BP:  (!) 120/59 (!) 127/57 136/63  Pulse: 75 79 75 70  Resp: 18 12 14 12   Temp:  97.9 F (36.6 C) 98.1 F (36.7 C) 97.8 F (36.6 C)  TempSrc:  Oral Oral Oral  SpO2: 100% 100% 100% 100%  Weight:      Height:        Intake/Output Summary (Last 24 hours) at 07/31/2017 1011 Last data filed at 07/31/2017 0600 Gross per 24 hour  Intake 690 ml  Output 700 ml  Net -10 ml   Filed Weights   07/29/17 1554  Weight: (!) 353 lb 2.8 oz (160.2 kg)    Telemetry    Sinus - Personally Reviewed  Physical Exam   GEN: Morbidly obese Neck: supple Cardiac: RRR Respiratory: Clear to auscultation bilaterally; no wheeze. GI: Soft, nontender, non-distended, obese MS: trace edema Neuro:  Grossly intact  Labs    Chemistry Recent Labs  Lab 07/29/17 1119 07/30/17 0347 07/31/17 0415  NA 145 146* 142  K 4.8 4.7 4.6  CL 106 109 105  CO2 32 31 33*  GLUCOSE 139* 96 106*  BUN 31* 36* 33*  CREATININE 1.64* 1.76* 1.34*  CALCIUM 8.8* 8.6* 8.4*  PROT 6.6 6.1* 5.8*  ALBUMIN 2.8* 2.5*  2.4*  AST 53* 75* 50*  ALT 56* 74* 69*  ALKPHOS 96 79 80  BILITOT 0.2* 0.5 0.5  GFRNONAA 31* 28* 39*  GFRAA 36* 33* 46*  ANIONGAP 7 6 4*     Hematology Recent Labs  Lab 07/29/17 1119 07/30/17 0347 07/31/17 0415  WBC 8.5 9.0 7.1  RBC 3.67* 3.16* 2.99*  HGB 10.7* 9.4* 8.8*  HCT 38.5 33.4* 31.6*  MCV 104.9* 105.7* 105.7*  MCH 29.2 29.7 29.4  MCHC 27.8* 28.1* 27.8*  RDW 15.9* 15.8* 15.9*  PLT 215 162 138*    Cardiac Enzymes Recent Labs  Lab 07/29/17 1625 07/29/17 2243 07/30/17 0347  TROPONINI 1.09* 1.69* 1.33*    BNP Recent Labs  Lab 07/29/17 1119  BNP 1,160.0*     Radiology    Ct Head Wo Contrast  Result Date: 07/29/2017 CLINICAL DATA:  67 y/o  F; confusion. EXAM: CT HEAD WITHOUT CONTRAST TECHNIQUE: Contiguous axial images were obtained from the base of the skull through the vertex without intravenous contrast. COMPARISON:  05/14/2017 MRI of the head and 05/12/2017 CT of the head. FINDINGS: Brain: No evidence of acute infarction, hemorrhage, hydrocephalus, extra-axial collection or mass lesion/mass  effect. Stable left inferior cerebellar chronic infarction. Stable mild chronic microvascular ischemic changes of the brain. Vascular: Calcific atherosclerosis of carotid siphons. No hyperdense vessel identified. Skull: Normal. Negative for fracture or focal lesion. Sinuses/Orbits: No acute finding. Other: None. IMPRESSION: 1. No acute intracranial abnormality identified. 2. Stable chronic left inferior cerebellar infarction. Electronically Signed   By: Mitzi HansenLance  Furusawa-Stratton M.D.   On: 07/29/2017 21:01   Ct Chest Wo Contrast  Result Date: 07/29/2017 CLINICAL DATA:  Shortness of breath and hypoxia. EXAM: CT CHEST WITHOUT CONTRAST TECHNIQUE: Multidetector CT imaging of the chest was performed following the standard protocol without IV contrast. COMPARISON:  07/05/2017 FINDINGS: Cardiovascular: Heart is normal in size. Minimal calcified plaque over the coronary arteries.  Remaining vascular structures are unremarkable. Mediastinum/Nodes: No significant mediastinal or hilar adenopathy. Remaining mediastinal structures are unremarkable. Lungs/Pleura: Lungs are hypoinflated with moderate elevation the right hemidiaphragm. There is consolidation over the right lower lobe without significant change likely atelectasis. Subtle patchy hazy opacification within the right lower lobe and minimally over the central left lung which may be due to infection. Minimal linear atelectasis over the lingula. No effusion. Airways are normal. Upper Abdomen: No acute abnormality. Musculoskeletal: Degenerative changes spine. IMPRESSION: Chronic consolidation posteromedial right base likely atelectasis. Subtle patchy hazy opacification over the right lower lobe and central left lung which may be due to infection, mild edema or atelectasis. Atherosclerotic coronary artery disease. Electronically Signed   By: Elberta Fortisaniel  Boyle M.D.   On: 07/29/2017 14:53   Dg Chest Port 1 View  Result Date: 07/30/2017 CLINICAL DATA:  Shortness of breath. EXAM: PORTABLE CHEST 1 VIEW COMPARISON:  CT scan and chest x-ray dated 07/29/2017 and chest x-ray dated 05/12/2017 FINDINGS: Heart size and pulmonary vascularity are normal. Chronic elevation of the right hemidiaphragm. Slightly increased mild bilateral atelectasis at the bases and in the left midzone. No discrete effusions. No acute bone abnormality. Previous median sternotomy. IMPRESSION: Slight increased bilateral atelectasis. Electronically Signed   By: Francene BoyersJames  Maxwell M.D.   On: 07/30/2017 08:21   Dg Chest Port 1 View  Result Date: 07/29/2017 CLINICAL DATA:  Encounter for shortness of breath. EXAM: PORTABLE CHEST 1 VIEW COMPARISON:  07/04/2017 FINDINGS: Stable changes from prior cardiac surgery. Cardiac silhouette normal in size. No mediastinal or hilar masses. Lung volumes are low, particularly on the right where there is elevation of right hemidiaphragm. There is lung  base opacity bilaterally consistent with atelectasis similar to the prior study. No evidence of pneumonia or pulmonary edema. No pneumothorax. Skeletal structures are grossly intact. IMPRESSION: No acute cardiopulmonary disease. Electronically Signed   By: Amie Portlandavid  Ormond M.D.   On: 07/29/2017 12:04   [place] Abdomen Limited Ruq  Result Date: 07/29/2017 CLINICAL DATA:  Abnormal liver function tests. EXAM: ULTRASOUND ABDOMEN LIMITED RIGHT UPPER QUADRANT COMPARISON:  Abdomen and pelvis CT dated 07/06/2017. FINDINGS: Gallbladder: Multiple gallstones in the gallbladder measuring up to 2.5 cm in maximum diameter each. Borderline gallbladder wall thickening with a maximum thickness of 3.0 mm. No pericholecystic fluid or sonographic Murphy sign. Common bile duct: Diameter: 3.3 mm Liver: No focal lesion identified. Within normal limits in parenchymal echogenicity. The recently demonstrated small cyst in the left lobe of the liver was not visualized at ultrasound today. Portal vein is patent on color Doppler imaging with normal direction of blood flow towards the liver. IMPRESSION: 1. Cholelithiasis without evidence of acute cholecystitis. 2. Borderline gallbladder wall thickening, possibly indicating mild chronic cholecystitis. Electronically Signed   By: Zada FindersSteven  Reid M.D.  On: 07/29/2017 18:27    Patient Profile     67 y.o. female with past medical history of coronary artery disease status post coronary artery bypass and graft, chronic diastolic congestive heart failure, hypertension, hyperlipidemia, prior CVA, diabetes mellitus admitted with respiratory failure.  Cardiology asked to evaluate for elevated troponin.  Last echocardiogram April 2019 showed vigorous LV function.  Assessment & Plan    1 elevated troponin-as outlined previously patient had no chest pain and denied dyspnea.  Echocardiogram shows normal LV function.  This is felt likely secondary to demand ischemia.  We will not pursue further ischemia  evaluation.    2 acute respiratory failure-this is likely secondary to sleep apnea, obesity hypoventilation syndrome.  She is using oxygen at rehab.  She needs to be on CPAP as well which she has at home.  3 acute on chronic diastolic congestive heart failure-patient's congestive heart failure is likely predominantly right-sided failure from sleep apnea/obesity hypoventilation syndrome with associated pulmonary hypertension.  Patient does not appear to be markedly volume overloaded on examination.  I will not give Lasix today.  Echocardiogram shows normal LV function, grade 1 diastolic dysfunction, moderate right ventricular enlargement with reduced function and mild biatrial enlargement.  Severe tricuspid regurgitation with moderate pulmonary hypertension.  4 coronary artery disease status post coronary artery bypass and graft-continue Plavix and statin.  5 morbid obesity-patient needs weight loss.  6 prior cerebellar CVA-continue Plavix.  7 acute stage III kidney disease-hold diuretics and follow.  For questions or updates, please contact [facility] Please consult [email or web address] for contact info under Cardiology/STEMI.      Signed, Olga Millers, MD  07/31/2017, 10:11 AM

## 2017-07-31 NOTE — Evaluation (Signed)
Physical Therapy Evaluation Patient Details Name: Megan Atkinson MRN: [id number] DOB: 1950-12-27 Today's Date: 07/31/2017   History of Present Illness  Megan NielsenBeverley Sterne  is a 67 y.o. female, w hypertension, hyperlipidemia, cad, stroke, glucose intolerance, morbid obeisity (BMI >40), OSA, (noncompliant with cpap), apparently presents with altered mental status, found to have hypoxic, hypercapneic respiratory failure per Ed.  Clinical Impression  Pt admitted with above diagnosis. Pt currently with functional limitations due to the deficits listed below (see PT Problem List). Pt requiring max/totalAx2 for mobility to EOB, pt only able to complete 1 stand. Pt will benefit from skilled PT to increase their independence and safety with mobility to allow discharge to the venue listed below.       Follow Up Recommendations SNF    Equipment Recommendations  None recommended by PT    Recommendations for Other Services       Precautions / Restrictions Precautions Precautions: Fall Restrictions Weight Bearing Restrictions: No      Mobility  Bed Mobility Overal bed mobility: Needs Assistance Bed Mobility: Supine to Sit;Sit to Supine     Supine to sit: Max assist;+2 for physical assistance Sit to supine: Total assist;+2 for physical assistance(+2 for safety due to body habitus)   General bed mobility comments: pt assited with LE movement and trunk elevation but required maxAX2 to scoot to EOB due to body habitus. Pt then demanded a man come and lift her up and put her back in the bed. Required 4 people to return pt safely to supine in bed as pt didn't offer an assist  Transfers Overall transfer level: Needs assistance Equipment used: Rolling walker (2 wheeled) Transfers: Sit to/from Stand Sit to Stand: Max assist;+2 physical assistance         General transfer comment: bed elevated, took 3 trials to achieve 1 stand and was unable to complete second stand  Ambulation/Gait              General Gait Details: unable  Stairs            Wheelchair Mobility    Modified Rankin (Stroke Patients Only)       Balance Overall balance assessment: Needs assistance Sitting-balance support: Bilateral upper extremity supported Sitting balance-Leahy Scale: Fair     Standing balance support: Bilateral upper extremity supported Standing balance-Leahy Scale: Poor Standing balance comment: dependent on UE and RW                             Pertinent Vitals/Pain Pain Assessment: No/denies pain    Home Living Family/patient expects to be discharged to:: Skilled nursing facility                 Additional Comments: pt was at [facility] for the last week. Pt was at home alone with caregivers but was relatively chair bound     Prior Function Level of Independence: Needs assistance   Gait / Transfers Assistance Needed: minimal ambulation, only transferring from bed to chair/bsc  ADL's / Homemaking Assistance Needed: dependent on staff        Hand Dominance   Dominant Hand: Right    Extremity/Trunk Assessment   Upper Extremity Assessment Upper Extremity Assessment: Generalized weakness    Lower Extremity Assessment Lower Extremity Assessment: Generalized weakness(pt only tolerated one stand)       Communication   Communication: No difficulties  Cognition Arousal/Alertness: Awake/alert Behavior During Therapy: WFL for tasks assessed/performed Overall Cognitive  Status: Within Functional Limits for tasks assessed                                        General Comments General comments (skin integrity, edema, etc.): pt with wound on L heal with dressing on it however noted seepage.     Exercises General Exercises - Lower Extremity Ankle Circles/Pumps: AROM;Both;10 reps;Supine Long Arc Quad: AROM;Both;10 reps;Seated Heel Slides: AROM;Both;10 reps;Seated   Assessment/Plan    PT Assessment Patient needs  continued PT services  PT Problem List Decreased strength;Decreased mobility;Decreased balance;Decreased activity tolerance;Obesity       PT Treatment Interventions DME instruction;Therapeutic activities;Balance training;Stair training;Functional mobility training;Gait training;Therapeutic exercise;Patient/family education;Wheelchair mobility training    PT Goals (Current goals can be found in the Care Plan section)  Acute Rehab PT Goals Patient Stated Goal: go back to rehab PT Goal Formulation: With patient Time For Goal Achievement: 08/14/17 Potential to Achieve Goals: Fair    Frequency Min 2X/week   Barriers to discharge        Co-evaluation               AM-PAC PT "6 Clicks" Daily Activity  Outcome Measure Difficulty turning over in bed (including adjusting bedclothes, sheets and blankets)?: Unable Difficulty moving from lying on back to sitting on the side of the bed? : Unable Difficulty sitting down on and standing up from a chair with arms (e.g., wheelchair, bedside commode, etc,.)?: Unable Help needed moving to and from a bed to chair (including a wheelchair)?: Total Help needed walking in hospital room?: Total Help needed climbing 3-5 steps with a railing? : Total 6 Click Score: 6    End of Session Equipment Utilized During Treatment: Gait belt;Oxygen Activity Tolerance: Patient tolerated treatment well Patient left: in bed;with call bell/phone within reach Nurse Communication: Mobility status;Need for lift equipment PT Visit Diagnosis: Muscle weakness (generalized) (M62.81);Other abnormalities of gait and mobility (R26.89)    Time: 1610-9604 PT Time Calculation (min) (ACUTE ONLY): 34 min   Charges:   PT Evaluation $PT Eval Moderate Complexity: 1 Mod PT Treatments $Therapeutic Activity: 8-22 mins   PT G Codes:        Lewis Shock, PT, DPT Pager #: [phone] Office #: [phone]   Tamorah Hada M Nari Vannatter 07/31/2017, 10:09 AM

## 2017-07-31 NOTE — Progress Notes (Addendum)
CSW [facility] #: [id number]  [facility] Anticipated discharge date: 6/25 Family notified: pt to notify son Transportation by [facility]- scheduled for 2:30pm  CSW signing off.  Burna SisJenna H. Lanell Dubie, LCSW Clinical Social Worker [phone]

## 2017-07-31 NOTE — Discharge Summary (Signed)
Megan Atkinson ZOX:[id number] DOB: 02/21/1950 DOA: 07/29/2017  PCP: Megan Ng, NP  Admit date: 07/29/2017  Discharge date: 07/31/2017  Admitted From: SNF   Disposition:  SNF   Recommendations for Outpatient Follow-up:   Follow up with PCP in 1-2 weeks  PCP Please obtain BMP/CBC, 2 view CXR in 1week,  (see Discharge instructions)   PCP Please follow up on the following pending results: Monitor fluid status and diuretic need closely.   Home Health: None Equipment/Devices: None Consultations: Cardiology Discharge Condition: Fair CODE STATUS: Full Diet Recommendation: Low carbohydrate heart healthy diet with strict 1.5 L/day total fluid restriction   Chief Complaint  Patient presents with  . Shortness of Breath     Brief history of present illness from the day of admission and additional interim summary    [name] a67 y.o.female,w hypertension, hyperlipidemia, cad, stroke, DM type II, morbid obeisity (BMI >40), OSA, (noncompliant with cpap), apparently presents with altered mental status with hypoxia and hypercapnia at SNF.  Also found to have slightly elevated BNP along with mildly elevated troponins, she was placed on BiPAP and admitted to the hospital for further care.  Initial running diagnosis was CHF/pneumonia.                                                                   Hospital Course    1.  Metabolic encephalopathy caused by acute hypercapnic and possible hypoxic respiratory failure.  ABGs were likely done on nonrebreather mask and do not reflect hypoxia, she is clearly noncompliant with her CPAP.  No evidence of pneumonia clinically, no cough fever or leukocytosis, clinically her edema was minimal and she says it is chronic, she always likes sleeping sitting up in the  pattern has not changed, she denies any unknown weight gain recently.    All antibiotics and diuretics were held, she was hydrated gently and placed on CPAP with complete resolution of symptoms and return to her baseline, she is completely symptom-free will be discharged back to SNF with daytime 2 L nasal cannula home oxygen and nighttime scheduled CPAP, CPAP should also be used to daytime if she is sleeping.  Patient is developing severe TR and pulmonary hypertension due to noncompliance with her CPAP and she has been adequately counseled.  Request SNF staff to kindly monitor her for compliance as well and schedule one-time outpatient follow-up with her cardiologist Dr. Jens Atkinson in the next 1 to 2 weeks after discharge.   2.  Morbid obesity with obstructive sleep apnea.  Noncompliant with CPAP, counseled.  Follow with PCP for weight loss.  3.  History of CAD.  No acute issues.  No chest pain, EKG nonacute, troponin mildly elevated and non-ACS pattern with a flat troponin trend, cardiology on board, she is currently on combination of Plavix,  statin for secondary prevention.  No beta-blocker due to low baseline blood pressures.    4.  Chronic diastolic CHF recent echogram shows a EF of around 60%.  Currently close to compensated.  5.  Mildly elevated LFTs likely due to some right heart strain due to #1 above. Bilirubin stable, right upper quadrant ultrasound stable, no abdominal pain.    Request SNF MD to kindly check CMP in a week trend is improving.  She might be developing right sided hepatic congestion due to severe TR in the setting of worsening pulmonary hypertension due to CPAP noncompliance at night.  6.  CKD 4.  Baseline creatinine close to 1.7 currently close to baseline.  He was dehydrated upon admission her 20 mg daily Lasix dose has been discontinued request SNF MD to kindly monitor her weight and diuretic neeed closely.  7.  CVA.  Continue platelet and statin combination for secondary  prevention.  No acute issues.  8.  Hypotension.  Due to intravascular depletion baseline blood pressures close to 150 systolic on a chronic basis, pressures have normalized after discontinuing Lasix and hydrating the patient gently with IV fluids.  9.  DM type II.  Continue home regimen at SNF post discharge.      Discharge diagnosis     Principal Problem:   Acute hypoxemic respiratory failure (HCC) Active Problems:   Anemia   Acute diastolic heart failure (HCC)   Renal insufficiency   Acute respiratory failure [facility])    Discharge instructions    Discharge Instructions    Discharge instructions   Complete by:  As directed    Follow with Primary MD Nche, Bonna Gains, NP in 7 days   Must wear CPAP whenever you are sleeping day or night without fail.  Get CBC, CMP, 2 view Chest X ray checked  by Primary MD or SNF MD in 5-7 days   Activity: As tolerated with Full fall precautions use walker/cane & assistance as needed  Disposition SNF  Diet: Low carbohydrate heart healthy diet with strict 1.5 L/day total fluid restriction.  Accuchecks 4 times/day, Once in AM empty stomach and then before each meal. Log in all results and show them to your Prim.MD in 3 days. If any glucose reading is under 80 or above 300 call your Prim MD immidiately. Follow Low glucose instructions for glucose under 80 as instructed.  Special Instructions: If you have smoked or chewed Tobacco  in the last 2 yrs please stop smoking, stop any regular Alcohol  and or any Recreational drug use.  On your next visit with your primary care physician please Get Medicines reviewed and adjusted.  Please request your Prim.MD to go over all Hospital Tests and Procedure/Radiological results at the follow up, please get all Hospital records sent to your Prim MD by signing hospital release before you go home.  If you experience worsening of your admission symptoms, develop shortness of breath, life threatening  emergency, suicidal or homicidal thoughts you must seek medical attention immediately by calling 911 or calling your MD immediately  if symptoms less severe.  You Must read complete instructions/literature along with all the possible adverse reactions/side effects for all the Medicines you take and that have been prescribed to you. Take any new Medicines after you have completely understood and accpet all the possible adverse reactions/side effects.   Increase activity slowly   Complete by:  As directed       Discharge Medications   Allergies as of 07/31/2017  Reactions   Lisinopril    cough   Metformin And Related Diarrhea   A lot of fluid loss from bowel and diarrhea      Medication List    STOP taking these medications   cefUROXime 500 MG tablet Commonly known as:  CEFTIN   doxycycline 100 MG tablet Commonly known as:  VIBRA-TABS   furosemide 20 MG tablet Commonly known as:  LASIX     TAKE these medications   acetaminophen 325 MG tablet Commonly known as:  TYLENOL Take 650 mg by mouth every 6 (six) hours as needed.   atorvastatin 80 MG tablet Commonly known as:  LIPITOR TAKE 1 TABLET BY MOUTH ONCE DAILY AT  6  PM   clopidogrel 75 MG tablet Commonly known as:  PLAVIX Take 1 tablet (75 mg total) by mouth daily.   collagenase ointment Commonly known as:  SANTYL Apply topically daily.   feeding supplement (PRO-STAT SUGAR FREE 64) Liqd Take 30 mLs by mouth 3 (three) times daily with meals.   fluticasone 50 MCG/ACT nasal spray Commonly known as:  FLONASE Place 1 spray into both nostrils daily. What changed:    when to take this  reasons to take this   gabapentin 100 MG capsule Commonly known as:  NEURONTIN Take 100 mg by mouth 2 (two) times daily.   metoprolol tartrate 25 MG tablet Commonly known as:  LOPRESSOR Take 12.5 mg by mouth 2 (two) times daily.   miconazole 2 % powder Commonly known as:  MICOTIN Apply topically 2 times daily at 12 noon  and 4 pm.   polyvinyl alcohol 1.4 % ophthalmic solution Commonly known as:  LIQUIFILM TEARS Place 2 drops into both eyes as needed for dry eyes.   protein supplement shake Liqd Commonly known as:  PREMIER PROTEIN Take 325 mLs (11 oz total) by mouth 2 (two) times daily between meals.   pyridoxine 100 MG tablet Commonly known as:  B-6 Take 100 mg by mouth daily.   senna-docusate 8.6-50 MG tablet Commonly known as:  Senokot-S Take 2 tablets by mouth 2 (two) times daily. What changed:    how much to take  when to take this  reasons to take this   sitaGLIPtin 50 MG tablet Commonly known as:  JANUVIA Take 1 tablet (50 mg total) by mouth daily.       Follow-up Information    Nche, Bonna Gains, NP. Schedule an appointment as soon as possible for a visit in 1 week(s).   Specialty:  Internal Medicine Contact information: [address] [place] [address] [phone]        Lewayne Bunting, MD. Schedule an appointment as soon as possible for a visit in 1 week(s).   Specialty:  Cardiology Why:  CHF, Severe TR Contact information: [address] [place] [address] [phone]           Major procedures and Radiology Reports - PLEASE review detailed and final reports thoroughly  -     TTE  - Left ventricle: The cavity size was normal. There was mild concentric hypertrophy. Systolic function was normal. Wall motion was normal; there were no regional wall motion abnormalities. Doppler parameters are consistent with abnormal left ventricular relaxation (grade 1 diastolic dysfunction). Doppler parameters are consistent with indeterminate left atrial filling pressure. - Ventricular septum: The contour showed diastolic flattening and systolic flattening. These changes are consistent with RV volume and pressure overload. - Left atrium: The atrium was mildly dilated. -  Right ventricle: The cavity size was moderately dilated. Wall thickness was normal.  Systolic function was mildly to moderately reduced. - Right atrium: The atrium was mildly dilated. - Atrial septum: No defect or patent foramen ovale was identified. - Tricuspid valve: There was severe regurgitation directed centrally. - Pulmonary arteries: Systolic pressure was moderately increased.   PA peak pressure: 55 mm Hg (S).   Ct Head Wo Contrast  Result Date: 07/29/2017 CLINICAL DATA:  67 y/o  F; confusion. EXAM: CT HEAD WITHOUT CONTRAST TECHNIQUE: Contiguous axial images were obtained from the base of the skull through the vertex without intravenous contrast. COMPARISON:  05/14/2017 MRI of the head and 05/12/2017 CT of the head. FINDINGS: Brain: No evidence of acute infarction, hemorrhage, hydrocephalus, extra-axial collection or mass lesion/mass effect. Stable left inferior cerebellar chronic infarction. Stable mild chronic microvascular ischemic changes of the brain. Vascular: Calcific atherosclerosis of carotid siphons. No hyperdense vessel identified. Skull: Normal. Negative for fracture or focal lesion. Sinuses/Orbits: No acute finding. Other: None. IMPRESSION: 1. No acute intracranial abnormality identified. 2. Stable chronic left inferior cerebellar infarction. Electronically Signed   By: Mitzi HansenLance  Furusawa-Stratton M.D.   On: 07/29/2017 21:01   Ct Chest Wo Contrast  Result Date: 07/29/2017 CLINICAL DATA:  Shortness of breath and hypoxia. EXAM: CT CHEST WITHOUT CONTRAST TECHNIQUE: Multidetector CT imaging of the chest was performed following the standard protocol without IV contrast. COMPARISON:  07/05/2017 FINDINGS: Cardiovascular: Heart is normal in size. Minimal calcified plaque over the coronary arteries. Remaining vascular structures are unremarkable. Mediastinum/Nodes: No significant mediastinal or hilar adenopathy. Remaining mediastinal structures are unremarkable. Lungs/Pleura: Lungs are hypoinflated with moderate elevation the right hemidiaphragm. There is consolidation over the  right lower lobe without significant change likely atelectasis. Subtle patchy hazy opacification within the right lower lobe and minimally over the central left lung which may be due to infection. Minimal linear atelectasis over the lingula. No effusion. Airways are normal. Upper Abdomen: No acute abnormality. Musculoskeletal: Degenerative changes spine. IMPRESSION: Chronic consolidation posteromedial right base likely atelectasis. Subtle patchy hazy opacification over the right lower lobe and central left lung which may be due to infection, mild edema or atelectasis. Atherosclerotic coronary artery disease. Electronically Signed   By: Elberta Fortisaniel  Boyle M.D.   On: 07/29/2017 14:53   Ct Chest Wo Contrast  Result Date: 07/05/2017 CLINICAL DATA:  Inpatient. CHF. Hypoxia. Dyspnea. Altered mental status. EXAM: CT CHEST WITHOUT CONTRAST TECHNIQUE: Multidetector CT imaging of the chest was performed following the standard protocol without IV contrast. COMPARISON:  Chest radiograph from one day prior. FINDINGS: Cardiovascular: Mild cardiomegaly. Three-vessel coronary atherosclerosis status post CABG. No significant pericardial effusion/thickening. Atherosclerotic nonaneurysmal thoracic aorta. Top-normal main pulmonary artery (3.0 cm diameter). Mediastinum/Nodes: No discrete thyroid nodules. Unremarkable esophagus. No pathologically enlarged axillary, mediastinal or hilar lymph nodes, noting limited sensitivity for the detection of hilar adenopathy on this noncontrast study. Lungs/Pleura: No pneumothorax. No significant pleural effusion. Low lung volumes. Multiple geographic regions of consolidation with associated air bronchograms and volume loss in the mid to lower lungs bilaterally, most prominent in the right middle and right lower lobes. No discrete lung masses. No significant pulmonary nodules. Chronic moderate elevation of the right hemidiaphragm. Upper abdomen: Cholelithiasis. Musculoskeletal: No aggressive appearing  focal osseous lesions. Intact sternotomy wires. Moderate thoracic spondylosis. IMPRESSION: 1. Low lung volumes. Multiple geographic regions of consolidation with associated air bronchograms and volume loss in the bilateral mid to lower lungs, most prominent in the right middle and right lower lobes. Atelectasis is favored.  Pneumonia is difficult to exclude particularly in the right lower lobe. 2. Chronic elevation of the right hemidiaphragm compatible with eventration. 3. Mild cardiomegaly.  No significant pleural effusions. 4. Cholelithiasis. Aortic Atherosclerosis (ICD10-I70.0). Electronically Signed   By: Delbert Phenix M.D.   On: 07/05/2017 14:49   US Renal  Result Date: 07/04/2017 CLINICAL DATA:  Hematuria. EXAM: RENAL / URINARY TRACT ULTRASOUND COMPLETE COMPARISON:  None. FINDINGS: Right Kidney: Evaluation of the right kidney is markedly limited due to the patient's body habitus. Right kidney measures approximately 12 cm. Left Kidney: Evaluation of the left kidney is markedly limited due to the patient's body habitus. Left kidney measures approximately 10.6 cm. Bladder: Appears normal for degree of bladder distention. IMPRESSION: Evaluation is limited due to patient body habitus. No obvious abnormalities. Electronically Signed   By: Gerome Sam III M.D   On: 07/04/2017 23:05   Dg Chest Port 1 View  Result Date: 07/30/2017 CLINICAL DATA:  Shortness of breath. EXAM: PORTABLE CHEST 1 VIEW COMPARISON:  CT scan and chest x-ray dated 07/29/2017 and chest x-ray dated 05/12/2017 FINDINGS: Heart size and pulmonary vascularity are normal. Chronic elevation of the right hemidiaphragm. Slightly increased mild bilateral atelectasis at the bases and in the left midzone. No discrete effusions. No acute bone abnormality. Previous median sternotomy. IMPRESSION: Slight increased bilateral atelectasis. Electronically Signed   By: Francene Boyers M.D.   On: 07/30/2017 08:21   Dg Chest Port 1 View  Result Date:  07/29/2017 CLINICAL DATA:  Encounter for shortness of breath. EXAM: PORTABLE CHEST 1 VIEW COMPARISON:  07/04/2017 FINDINGS: Stable changes from prior cardiac surgery. Cardiac silhouette normal in size. No mediastinal or hilar masses. Lung volumes are low, particularly on the right where there is elevation of right hemidiaphragm. There is lung base opacity bilaterally consistent with atelectasis similar to the prior study. No evidence of pneumonia or pulmonary edema. No pneumothorax. Skeletal structures are grossly intact. IMPRESSION: No acute cardiopulmonary disease. Electronically Signed   By: Amie Portland M.D.   On: 07/29/2017 12:04   Dg Chest Port 1 View  Result Date: 07/04/2017 CLINICAL DATA:  Hypoxia. Shortness of breath. Altered mental status. EXAM: PORTABLE CHEST 1 VIEW COMPARISON:  05/15/2017 and 05/14/2017 FINDINGS: Heart size is normal. There is slight pulmonary vascular prominence, most likely due to a shallow inspiration. Chronic elevation of the right hemidiaphragm. Previous median sternotomy. No significant bone abnormality. IMPRESSION: Accentuated vascularity most likely due to a shallow inspiration. Chronic elevation of the right hemidiaphragm. Electronically Signed   By: Francene Boyers M.D.   On: 07/04/2017 17:11   Ct Renal Stone Study  Result Date: 07/06/2017 CLINICAL DATA:  Hematuria EXAM: CT ABDOMEN AND PELVIS WITHOUT CONTRAST TECHNIQUE: Multidetector CT imaging of the abdomen and pelvis was performed following the standard protocol without IV contrast. COMPARISON:  None. FINDINGS: Lower chest: Multi segment collapse in the right middle and lower lobe. Coronary atherosclerosis. Hepatobiliary: 1 cm cystic density incidentally noted in the left liver.Cholelithiasis. No evidence of acute cholecystitis. Pancreas: Indistinct pancreas with cystic expansion of the lower head and uncinate process involving a nearly 4 cm area. Spleen: Unremarkable. Adrenals/Urinary Tract: Negative adrenals.  Innumerable calculi within the left intrarenal collecting system, which also contains gas ventrally. The left kidney is atrophic. No parenchymal gas. The bladder shows mild circumferential wall thickening with gas ventrally. Bowel loops are close to the dome of the bladder, without suspected fistula. Stomach/Bowel:  No obstruction. No inflammatory changes. Vascular/Lymphatic: No acute vascular abnormality. No mass or adenopathy.  Reproductive:Hysterectomy.  Negative adnexae. Other: No ascites or pneumoperitoneum. Musculoskeletal: No acute abnormalities. No acute or aggressive finding. Notable bilateral hip osteoarthritis. These results were called by telephone at the time of interpretation on 07/06/2017 at 1:07 pm to Dr. Calvert Cantor , who verbally acknowledged these results. IMPRESSION: 1. Gas in the left renal pelvis and urinary bladder. 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 2. History of hematuria with numerous calculi throughout the left kidney. Left renal atrophy. 3. Cystic masslike enlargement of the pancreas uncinate process. Recommend pancreas protocol MRI or CT with contrast after return to baseline renal function. 4. Multi segment right middle and lower lobe collapse. 5. Cholelithiasis. Electronically Signed   By: Marnee Spring M.D.   On: 07/06/2017 13:08   US Abdomen Limited Ruq  Result Date: 07/29/2017 CLINICAL DATA:  Abnormal liver function tests. EXAM: ULTRASOUND ABDOMEN LIMITED RIGHT UPPER QUADRANT COMPARISON:  Abdomen and pelvis CT dated 07/06/2017. FINDINGS: Gallbladder: Multiple gallstones in the gallbladder measuring up to 2.5 cm in maximum diameter each. Borderline gallbladder wall thickening with a maximum thickness of 3.0 mm. No pericholecystic fluid or sonographic Murphy sign. Common bile duct: Diameter: 3.3 mm Liver: No focal lesion identified. Within normal limits in parenchymal echogenicity. The recently demonstrated  small cyst in the left lobe of the liver was not visualized at ultrasound today. Portal vein is patent on color Doppler imaging with normal direction of blood flow towards the liver. IMPRESSION: 1. Cholelithiasis without evidence of acute cholecystitis. 2. Borderline gallbladder wall thickening, possibly indicating mild chronic cholecystitis. Electronically Signed   By: Beckie Salts M.D.   On: 07/29/2017 18:27    Micro Results     Recent Results (from the past 240 hour(s))  Respiratory Panel by PCR     Status: None   Collection Time: 07/29/17  3:47 PM  Result Value Ref Range Status   Adenovirus NOT DETECTED NOT DETECTED Final   Coronavirus 229E NOT DETECTED NOT DETECTED Final   Coronavirus HKU1 NOT DETECTED NOT DETECTED Final   Coronavirus NL63 NOT DETECTED NOT DETECTED Final   Coronavirus OC43 NOT DETECTED NOT DETECTED Final   Metapneumovirus NOT DETECTED NOT DETECTED Final   Rhinovirus / Enterovirus NOT DETECTED NOT DETECTED Final   Influenza A NOT DETECTED NOT DETECTED Final   Influenza B NOT DETECTED NOT DETECTED Final   Parainfluenza Virus 1 NOT DETECTED NOT DETECTED Final   Parainfluenza Virus 2 NOT DETECTED NOT DETECTED Final   Parainfluenza Virus 3 NOT DETECTED NOT DETECTED Final   Parainfluenza Virus 4 NOT DETECTED NOT DETECTED Final   Respiratory Syncytial Virus NOT DETECTED NOT DETECTED Final   Bordetella pertussis NOT DETECTED NOT DETECTED Final   Chlamydophila pneumoniae NOT DETECTED NOT DETECTED Final   Mycoplasma pneumoniae NOT DETECTED NOT DETECTED Final    Comment: Performed at [facility], [address] [place], [place] [address]  MRSA PCR Screening     Status: None   Collection Time: 07/29/17  3:47 PM  Result Value Ref Range Status   MRSA by PCR NEGATIVE NEGATIVE Final    Comment:        The GeneXpert MRSA Assay (FDA approved for NASAL specimens only), is one component of a comprehensive MRSA colonization surveillance program. It is not intended to  diagnose MRSA infection nor to guide or monitor treatment for MRSA infections. Performed at [facility], [address] [place], [place] [address]   Culture, blood (routine  x 2) Call MD if unable to obtain prior to antibiotics being given     Status: None (Preliminary result)   Collection Time: 07/29/17  4:25 PM  Result Value Ref Range Status   Specimen Description BLOOD LEFT ANTECUBITAL  Final   Special Requests   Final    BOTTLES DRAWN AEROBIC AND ANAEROBIC Blood Culture adequate volume   Culture   Final    NO GROWTH 2 DAYS Performed at [facility], [address] [place], [place] [address]    Report Status PENDING  Incomplete  Culture, blood (routine x 2) Call MD if unable to obtain prior to antibiotics being given     Status: None (Preliminary result)   Collection Time: 07/29/17  4:37 PM  Result Value Ref Range Status   Specimen Description BLOOD RIGHT ANTECUBITAL  Final   Special Requests   Final    BOTTLES DRAWN AEROBIC AND ANAEROBIC Blood Culture adequate volume   Culture   Final    NO GROWTH 2 DAYS Performed at [facility], [address] [place], [place] [address]    Report Status PENDING  Incomplete    Today   Subjective    Megan Atkinson today has no headache,no chest abdominal pain,no new weakness tingling or numbness, feels much better wants to go home today.     Objective   Blood pressure 132/62, pulse 68, temperature 97.9 F (36.6 C), temperature source Oral, resp. rate 13, height 5\' 7"  (1.702 m), weight (!) 160.2 kg (353 lb 2.8 oz), SpO2 100 %.   Intake/Output Summary (Last 24 hours) at 07/31/2017 1215 Last data filed at 07/31/2017 0600 Gross per 24 hour  Intake 450 ml  Output 700 ml  Net -250 ml    Exam Awake Alert, Oriented x 3, No new F.N deficits, Normal affect [place].AT,PERRAL Supple Neck,No JVD, No cervical lymphadenopathy appriciated.  Symmetrical Chest wall movement, Good air movement bilaterally, CTAB RRR,No  Gallops,Rubs or new Murmurs, No Parasternal Heave +ve B.Sounds, Abd Soft, Non tender, No organomegaly appriciated, No rebound -guarding or rigidity. No Cyanosis, Clubbing , trace to no edema, No new Rash or bruise   Data Review   CBC w Diff:  Lab Results  Component Value Date   WBC 7.1 07/31/2017   HGB 8.8 (L) 07/31/2017   HCT 31.6 (L) 07/31/2017   PLT 138 (L) 07/31/2017   LYMPHOPCT 12 07/29/2017   BANDSPCT 0 05/17/2017   MONOPCT 9 07/29/2017   EOSPCT 0 07/29/2017   BASOPCT 1 07/29/2017    CMP:  Lab Results  Component Value Date   NA 142 07/31/2017   K 4.6 07/31/2017   CL 105 07/31/2017   CO2 33 (H) 07/31/2017   BUN 33 (H) 07/31/2017   CREATININE 1.34 (H) 07/31/2017   CREATININE 1.08 (H) 11/08/2015   PROT 5.8 (L) 07/31/2017   PROT 6.5 02/14/2017   ALBUMIN 2.4 (L) 07/31/2017   ALBUMIN 3.8 02/14/2017   BILITOT 0.5 07/31/2017   BILITOT <0.2 02/14/2017   ALKPHOS 80 07/31/2017   AST 50 (H) 07/31/2017   ALT 69 (H) 07/31/2017  .   Total Time in preparing paper work, data evaluation and todays exam - 35 minutes  Susa Raring M.D on 07/31/2017 at 12:15 PM  [facility]   Office  [phone]

## 2017-08-01 DIAGNOSIS — I5032 Chronic diastolic (congestive) heart failure: Secondary | ICD-10-CM | POA: Diagnosis not present

## 2017-08-01 DIAGNOSIS — R0602 Shortness of breath: Secondary | ICD-10-CM | POA: Diagnosis not present

## 2017-08-01 DIAGNOSIS — I110 Hypertensive heart disease with heart failure: Secondary | ICD-10-CM | POA: Diagnosis not present

## 2017-08-02 ENCOUNTER — Telehealth: Payer: Self-pay | Admitting: Behavioral Health

## 2017-08-02 NOTE — Telephone Encounter (Signed)
Called & verified with Herbert SetaHeather at [facility] ([facility] that the patient was admitted to the facility post hospital discharge on 07/31/17.

## 2017-08-03 LAB — CULTURE, BLOOD (ROUTINE X 2)
CULTURE: NO GROWTH
CULTURE: NO GROWTH
SPECIAL REQUESTS: ADEQUATE
Special Requests: ADEQUATE

## 2017-08-07 ENCOUNTER — Ambulatory Visit: Payer: Medicare Other | Admitting: Nurse Practitioner

## 2017-08-07 DIAGNOSIS — I2510 Atherosclerotic heart disease of native coronary artery without angina pectoris: Secondary | ICD-10-CM | POA: Diagnosis not present

## 2017-08-07 DIAGNOSIS — J9621 Acute and chronic respiratory failure with hypoxia: Secondary | ICD-10-CM | POA: Diagnosis not present

## 2017-08-07 DIAGNOSIS — I10 Essential (primary) hypertension: Secondary | ICD-10-CM | POA: Diagnosis not present

## 2017-08-08 DIAGNOSIS — G9341 Metabolic encephalopathy: Secondary | ICD-10-CM | POA: Diagnosis not present

## 2017-08-08 DIAGNOSIS — I509 Heart failure, unspecified: Secondary | ICD-10-CM | POA: Diagnosis not present

## 2017-08-08 DIAGNOSIS — I10 Essential (primary) hypertension: Secondary | ICD-10-CM | POA: Diagnosis not present

## 2017-08-08 DIAGNOSIS — R0602 Shortness of breath: Secondary | ICD-10-CM | POA: Diagnosis not present

## 2017-08-08 DIAGNOSIS — I259 Chronic ischemic heart disease, unspecified: Secondary | ICD-10-CM | POA: Diagnosis not present

## 2017-08-08 DIAGNOSIS — I639 Cerebral infarction, unspecified: Secondary | ICD-10-CM | POA: Diagnosis not present

## 2017-08-08 DIAGNOSIS — I5032 Chronic diastolic (congestive) heart failure: Secondary | ICD-10-CM | POA: Diagnosis not present

## 2017-08-15 DIAGNOSIS — J9601 Acute respiratory failure with hypoxia: Secondary | ICD-10-CM | POA: Diagnosis not present

## 2017-08-15 DIAGNOSIS — R0602 Shortness of breath: Secondary | ICD-10-CM | POA: Diagnosis not present

## 2017-08-15 DIAGNOSIS — I5033 Acute on chronic diastolic (congestive) heart failure: Secondary | ICD-10-CM | POA: Diagnosis not present

## 2017-08-15 DIAGNOSIS — R0989 Other specified symptoms and signs involving the circulatory and respiratory systems: Secondary | ICD-10-CM | POA: Diagnosis not present

## 2017-08-15 DIAGNOSIS — J189 Pneumonia, unspecified organism: Secondary | ICD-10-CM | POA: Diagnosis not present

## 2017-08-16 DIAGNOSIS — J9601 Acute respiratory failure with hypoxia: Secondary | ICD-10-CM | POA: Diagnosis not present

## 2017-08-16 DIAGNOSIS — L97219 Non-pressure chronic ulcer of right calf with unspecified severity: Secondary | ICD-10-CM | POA: Diagnosis not present

## 2017-08-16 DIAGNOSIS — R0989 Other specified symptoms and signs involving the circulatory and respiratory systems: Secondary | ICD-10-CM | POA: Diagnosis not present

## 2017-08-16 DIAGNOSIS — R05 Cough: Secondary | ICD-10-CM | POA: Diagnosis not present

## 2017-08-16 DIAGNOSIS — J189 Pneumonia, unspecified organism: Secondary | ICD-10-CM | POA: Diagnosis not present

## 2017-08-17 DIAGNOSIS — J9601 Acute respiratory failure with hypoxia: Secondary | ICD-10-CM | POA: Diagnosis not present

## 2017-08-17 DIAGNOSIS — I5033 Acute on chronic diastolic (congestive) heart failure: Secondary | ICD-10-CM | POA: Diagnosis not present

## 2017-08-17 DIAGNOSIS — J189 Pneumonia, unspecified organism: Secondary | ICD-10-CM | POA: Diagnosis not present

## 2017-08-17 DIAGNOSIS — R0602 Shortness of breath: Secondary | ICD-10-CM | POA: Diagnosis not present

## 2017-08-17 DIAGNOSIS — N189 Chronic kidney disease, unspecified: Secondary | ICD-10-CM | POA: Diagnosis not present

## 2017-08-17 DIAGNOSIS — I10 Essential (primary) hypertension: Secondary | ICD-10-CM | POA: Diagnosis not present

## 2017-08-27 ENCOUNTER — Ambulatory Visit: Payer: Medicare Other | Admitting: Nurse Practitioner

## 2017-08-27 DIAGNOSIS — I110 Hypertensive heart disease with heart failure: Secondary | ICD-10-CM | POA: Diagnosis not present

## 2017-08-27 DIAGNOSIS — I5032 Chronic diastolic (congestive) heart failure: Secondary | ICD-10-CM | POA: Diagnosis not present

## 2017-08-27 DIAGNOSIS — R627 Adult failure to thrive: Secondary | ICD-10-CM | POA: Diagnosis not present

## 2017-08-29 DIAGNOSIS — N189 Chronic kidney disease, unspecified: Secondary | ICD-10-CM | POA: Diagnosis not present

## 2017-08-29 DIAGNOSIS — I509 Heart failure, unspecified: Secondary | ICD-10-CM | POA: Diagnosis not present

## 2017-08-30 ENCOUNTER — Ambulatory Visit: Payer: Medicare Other | Admitting: Nurse Practitioner

## 2017-09-03 DIAGNOSIS — I5033 Acute on chronic diastolic (congestive) heart failure: Secondary | ICD-10-CM | POA: Diagnosis not present

## 2017-09-03 DIAGNOSIS — R0602 Shortness of breath: Secondary | ICD-10-CM | POA: Diagnosis not present

## 2017-09-03 DIAGNOSIS — R609 Edema, unspecified: Secondary | ICD-10-CM | POA: Diagnosis not present

## 2017-09-08 DIAGNOSIS — I639 Cerebral infarction, unspecified: Secondary | ICD-10-CM | POA: Diagnosis not present

## 2017-09-08 DIAGNOSIS — I5032 Chronic diastolic (congestive) heart failure: Secondary | ICD-10-CM | POA: Diagnosis not present

## 2017-09-08 DIAGNOSIS — I259 Chronic ischemic heart disease, unspecified: Secondary | ICD-10-CM | POA: Diagnosis not present

## 2017-09-08 DIAGNOSIS — I10 Essential (primary) hypertension: Secondary | ICD-10-CM | POA: Diagnosis not present

## 2017-09-21 DIAGNOSIS — E1151 Type 2 diabetes mellitus with diabetic peripheral angiopathy without gangrene: Secondary | ICD-10-CM | POA: Diagnosis not present

## 2017-09-21 DIAGNOSIS — B351 Tinea unguium: Secondary | ICD-10-CM | POA: Diagnosis not present

## 2017-09-24 DIAGNOSIS — N184 Chronic kidney disease, stage 4 (severe): Secondary | ICD-10-CM | POA: Diagnosis not present

## 2017-09-24 DIAGNOSIS — Z6841 Body Mass Index (BMI) 40.0 and over, adult: Secondary | ICD-10-CM | POA: Diagnosis not present

## 2017-09-24 DIAGNOSIS — E1140 Type 2 diabetes mellitus with diabetic neuropathy, unspecified: Secondary | ICD-10-CM | POA: Diagnosis not present

## 2017-09-24 DIAGNOSIS — I5032 Chronic diastolic (congestive) heart failure: Secondary | ICD-10-CM | POA: Diagnosis not present

## 2017-09-26 ENCOUNTER — Ambulatory Visit: Payer: Medicare Other | Admitting: Pulmonary Disease

## 2017-10-02 DIAGNOSIS — M6281 Muscle weakness (generalized): Secondary | ICD-10-CM | POA: Diagnosis not present

## 2017-10-02 DIAGNOSIS — M1990 Unspecified osteoarthritis, unspecified site: Secondary | ICD-10-CM | POA: Diagnosis not present

## 2017-10-02 DIAGNOSIS — I6990 Unspecified sequelae of unspecified cerebrovascular disease: Secondary | ICD-10-CM | POA: Diagnosis not present

## 2017-10-09 DIAGNOSIS — I10 Essential (primary) hypertension: Secondary | ICD-10-CM | POA: Diagnosis not present

## 2017-10-09 DIAGNOSIS — I639 Cerebral infarction, unspecified: Secondary | ICD-10-CM | POA: Diagnosis not present

## 2017-10-09 DIAGNOSIS — I5032 Chronic diastolic (congestive) heart failure: Secondary | ICD-10-CM | POA: Diagnosis not present

## 2017-10-09 DIAGNOSIS — I259 Chronic ischemic heart disease, unspecified: Secondary | ICD-10-CM | POA: Diagnosis not present

## 2017-10-10 DIAGNOSIS — D649 Anemia, unspecified: Secondary | ICD-10-CM | POA: Diagnosis not present

## 2017-10-10 DIAGNOSIS — E039 Hypothyroidism, unspecified: Secondary | ICD-10-CM | POA: Diagnosis not present

## 2017-10-10 DIAGNOSIS — E785 Hyperlipidemia, unspecified: Secondary | ICD-10-CM | POA: Diagnosis not present

## 2017-10-10 DIAGNOSIS — E119 Type 2 diabetes mellitus without complications: Secondary | ICD-10-CM | POA: Diagnosis not present

## 2017-10-16 DIAGNOSIS — M1990 Unspecified osteoarthritis, unspecified site: Secondary | ICD-10-CM | POA: Diagnosis not present

## 2017-10-16 DIAGNOSIS — M25532 Pain in left wrist: Secondary | ICD-10-CM | POA: Diagnosis not present

## 2017-10-16 DIAGNOSIS — M6281 Muscle weakness (generalized): Secondary | ICD-10-CM | POA: Diagnosis not present

## 2017-10-16 DIAGNOSIS — I6990 Unspecified sequelae of unspecified cerebrovascular disease: Secondary | ICD-10-CM | POA: Diagnosis not present

## 2017-10-17 DIAGNOSIS — M6281 Muscle weakness (generalized): Secondary | ICD-10-CM | POA: Diagnosis not present

## 2017-10-17 DIAGNOSIS — D649 Anemia, unspecified: Secondary | ICD-10-CM | POA: Diagnosis not present

## 2017-10-17 DIAGNOSIS — E559 Vitamin D deficiency, unspecified: Secondary | ICD-10-CM | POA: Diagnosis not present

## 2017-10-17 DIAGNOSIS — R5383 Other fatigue: Secondary | ICD-10-CM | POA: Diagnosis not present

## 2017-10-17 DIAGNOSIS — E119 Type 2 diabetes mellitus without complications: Secondary | ICD-10-CM | POA: Diagnosis not present

## 2017-10-17 DIAGNOSIS — G8104 Flaccid hemiplegia affecting left nondominant side: Secondary | ICD-10-CM | POA: Diagnosis not present

## 2017-10-17 DIAGNOSIS — I10 Essential (primary) hypertension: Secondary | ICD-10-CM | POA: Diagnosis not present

## 2017-11-02 DIAGNOSIS — F419 Anxiety disorder, unspecified: Secondary | ICD-10-CM | POA: Diagnosis not present

## 2017-11-02 DIAGNOSIS — I5032 Chronic diastolic (congestive) heart failure: Secondary | ICD-10-CM | POA: Diagnosis not present

## 2017-11-02 DIAGNOSIS — G4733 Obstructive sleep apnea (adult) (pediatric): Secondary | ICD-10-CM | POA: Diagnosis not present

## 2017-11-08 DIAGNOSIS — I5032 Chronic diastolic (congestive) heart failure: Secondary | ICD-10-CM | POA: Diagnosis not present

## 2017-11-08 DIAGNOSIS — I259 Chronic ischemic heart disease, unspecified: Secondary | ICD-10-CM | POA: Diagnosis not present

## 2017-11-08 DIAGNOSIS — I639 Cerebral infarction, unspecified: Secondary | ICD-10-CM | POA: Diagnosis not present

## 2017-11-08 DIAGNOSIS — I10 Essential (primary) hypertension: Secondary | ICD-10-CM | POA: Diagnosis not present

## 2017-11-09 DIAGNOSIS — J9601 Acute respiratory failure with hypoxia: Secondary | ICD-10-CM | POA: Diagnosis not present

## 2017-11-09 DIAGNOSIS — I639 Cerebral infarction, unspecified: Secondary | ICD-10-CM | POA: Diagnosis not present

## 2017-11-09 DIAGNOSIS — M6281 Muscle weakness (generalized): Secondary | ICD-10-CM | POA: Diagnosis not present

## 2017-11-13 DIAGNOSIS — M79642 Pain in left hand: Secondary | ICD-10-CM | POA: Diagnosis not present

## 2017-11-14 DIAGNOSIS — M1990 Unspecified osteoarthritis, unspecified site: Secondary | ICD-10-CM | POA: Diagnosis not present

## 2017-11-14 DIAGNOSIS — I69354 Hemiplegia and hemiparesis following cerebral infarction affecting left non-dominant side: Secondary | ICD-10-CM | POA: Diagnosis not present

## 2017-11-14 DIAGNOSIS — I6930 Unspecified sequelae of cerebral infarction: Secondary | ICD-10-CM | POA: Diagnosis not present

## 2017-11-14 DIAGNOSIS — M79642 Pain in left hand: Secondary | ICD-10-CM | POA: Diagnosis not present

## 2017-11-26 DIAGNOSIS — E1140 Type 2 diabetes mellitus with diabetic neuropathy, unspecified: Secondary | ICD-10-CM | POA: Diagnosis not present

## 2017-11-26 DIAGNOSIS — N184 Chronic kidney disease, stage 4 (severe): Secondary | ICD-10-CM | POA: Diagnosis not present

## 2017-11-26 DIAGNOSIS — I5032 Chronic diastolic (congestive) heart failure: Secondary | ICD-10-CM | POA: Diagnosis not present

## 2017-11-26 DIAGNOSIS — I130 Hypertensive heart and chronic kidney disease with heart failure and stage 1 through stage 4 chronic kidney disease, or unspecified chronic kidney disease: Secondary | ICD-10-CM | POA: Diagnosis not present

## 2017-11-30 DIAGNOSIS — M6281 Muscle weakness (generalized): Secondary | ICD-10-CM | POA: Diagnosis not present

## 2017-11-30 DIAGNOSIS — R609 Edema, unspecified: Secondary | ICD-10-CM | POA: Diagnosis not present

## 2017-11-30 DIAGNOSIS — I69354 Hemiplegia and hemiparesis following cerebral infarction affecting left non-dominant side: Secondary | ICD-10-CM | POA: Diagnosis not present

## 2017-12-03 DIAGNOSIS — Z7984 Long term (current) use of oral hypoglycemic drugs: Secondary | ICD-10-CM | POA: Diagnosis not present

## 2017-12-03 DIAGNOSIS — R262 Difficulty in walking, not elsewhere classified: Secondary | ICD-10-CM | POA: Diagnosis not present

## 2017-12-03 DIAGNOSIS — E1151 Type 2 diabetes mellitus with diabetic peripheral angiopathy without gangrene: Secondary | ICD-10-CM | POA: Diagnosis not present

## 2017-12-03 DIAGNOSIS — B351 Tinea unguium: Secondary | ICD-10-CM | POA: Diagnosis not present

## 2017-12-09 DIAGNOSIS — I639 Cerebral infarction, unspecified: Secondary | ICD-10-CM | POA: Diagnosis not present

## 2017-12-09 DIAGNOSIS — I5032 Chronic diastolic (congestive) heart failure: Secondary | ICD-10-CM | POA: Diagnosis not present

## 2017-12-09 DIAGNOSIS — I10 Essential (primary) hypertension: Secondary | ICD-10-CM | POA: Diagnosis not present

## 2017-12-09 DIAGNOSIS — I259 Chronic ischemic heart disease, unspecified: Secondary | ICD-10-CM | POA: Diagnosis not present

## 2017-12-12 DIAGNOSIS — Z139 Encounter for screening, unspecified: Secondary | ICD-10-CM | POA: Diagnosis not present

## 2017-12-12 DIAGNOSIS — N19 Unspecified kidney failure: Secondary | ICD-10-CM | POA: Diagnosis not present

## 2017-12-19 DIAGNOSIS — I130 Hypertensive heart and chronic kidney disease with heart failure and stage 1 through stage 4 chronic kidney disease, or unspecified chronic kidney disease: Secondary | ICD-10-CM | POA: Diagnosis not present

## 2017-12-19 DIAGNOSIS — N184 Chronic kidney disease, stage 4 (severe): Secondary | ICD-10-CM | POA: Diagnosis not present

## 2017-12-19 DIAGNOSIS — E1140 Type 2 diabetes mellitus with diabetic neuropathy, unspecified: Secondary | ICD-10-CM | POA: Diagnosis not present

## 2017-12-19 DIAGNOSIS — I5032 Chronic diastolic (congestive) heart failure: Secondary | ICD-10-CM | POA: Diagnosis not present

## 2017-12-19 NOTE — Progress Notes (Deleted)
HPI: FU CAD and diastolic CHF. She was admitted 11/12 with an NSTEMI. LHC 01/06/11: LAD 90-95%, mid D1 90%, proximal D2 50%, proximal OM 50%, RCA occluded, EF greater than 65%. She was referred for bypass. This was done with Dr. Laneta SimmersBartle 01/12/11: LIMA-LAD. The distal RCA was diffusely diseased and not thought to be graftable. Saphenous vein was fairly small and not felt suitable as a conduit. Patient had cerebellar CVA January 2018. CTA January 2018 showed no hemodynamically significant stenosis in the neck.  Carotid dopplers 4/19 showed 1-39 bilateral stenosis.  CT May 2019 showed masslike enlargement of the pancreas and pancreatic MRI or CT with contrast recommended further assess.  Admitted June with metabolic encephalopathy.  Also with respiratory failure.  Echocardiogram June 2019 showed normal LV function, mild diastolic dysfunction, mild left atrial enlargement, moderate right ventricular enlargement, mild to moderate RV dysfunction, mild right atrial enlargement, severe tricuspid regurgitation and moderate pulmonary hypertension.  Since last seen,   Current Outpatient Medications  Medication Sig Dispense Refill  . acetaminophen (TYLENOL) 325 MG tablet Take 650 mg by mouth every 6 (six) hours as needed.    . Amino Acids-Protein Hydrolys (FEEDING SUPPLEMENT, PRO-STAT SUGAR FREE 64,) LIQD Take 30 mLs by mouth 3 (three) times daily with meals.    Marland Kitchen. atorvastatin (LIPITOR) 80 MG tablet TAKE 1 TABLET BY MOUTH ONCE DAILY AT  6  PM 90 tablet 3  . clopidogrel (PLAVIX) 75 MG tablet Take 1 tablet (75 mg total) by mouth daily. 90 tablet 3  . collagenase (SANTYL) ointment Apply topically daily. (Patient not taking: Reported on 07/29/2017) 15 g 0  . fluticasone (FLONASE) 50 MCG/ACT nasal spray Place 1 spray into both nostrils daily. (Patient taking differently: Place 1 spray into both nostrils daily as needed for allergies. ) 9.9 g 0  . gabapentin (NEURONTIN) 100 MG capsule Take 100 mg by mouth 2 (two)  times daily.    . metoprolol tartrate (LOPRESSOR) 25 MG tablet Take 12.5 mg by mouth 2 (two) times daily.    . miconazole (MICOTIN) 2 % powder Apply topically 2 times daily at 12 noon and 4 pm. (Patient not taking: Reported on 07/29/2017) 70 g 2  . polyvinyl alcohol (LIQUIFILM TEARS) 1.4 % ophthalmic solution Place 2 drops into both eyes as needed for dry eyes. (Patient not taking: Reported on 07/29/2017) 15 mL 0  . protein supplement shake (PREMIER PROTEIN) LIQD Take 325 mLs (11 oz total) by mouth 2 (two) times daily between meals. (Patient not taking: Reported on 07/29/2017) 60 Can 0  . pyridoxine (B-6) 100 MG tablet Take 100 mg by mouth daily.    Marland Kitchen. senna-docusate (SENOKOT-S) 8.6-50 MG tablet Take 2 tablets by mouth 2 (two) times daily. (Patient taking differently: Take 1 tablet by mouth at bedtime as needed for mild constipation or moderate constipation. ) 100 tablet 0  . sitaGLIPtin (JANUVIA) 50 MG tablet Take 1 tablet (50 mg total) by mouth daily. 30 tablet 0   No current facility-administered medications for this visit.      Past Medical History:  Diagnosis Date  . CAD (coronary artery disease)    small NSTEMI 11/12: LHC 01/06/11:  LAD 90-95%, mD1 90%, pD2 50%,  pOM1 50%, RCA occluded,  EF > 65%;  CABG  12/12: L-LAD  . Diabetes mellitus   . Glucose intolerance (impaired glucose tolerance)    Hemoglobin A1c 6.4 in 12/2010  . HTN (hypertension)    Echo 01/06/11: EF 65%  .  Hyperlipidemia   . Obesity   . Stroke [facility]) 03/06/2016    Past Surgical History:  Procedure Laterality Date  . ABDOMINAL HYSTERECTOMY    . APPENDECTOMY    . CORONARY ARTERY BYPASS GRAFT  01/12/2011   Procedure: CORONARY ARTERY BYPASS GRAFTING (CABG);  Surgeon: Alleen Borne, MD;  Location: [facility] OR;  Service: Open Heart Surgery;  Laterality: N/A;  coronary artery bypass graft times one using left internal mammary artery . Attempted endoscopic saphenous vein harvest  . LEFT HEART CATHETERIZATION WITH CORONARY ANGIOGRAM  N/A 01/06/2011   Procedure: LEFT HEART CATHETERIZATION WITH CORONARY ANGIOGRAM;  Surgeon: Rollene Rotunda, MD;  Location: [facility] CATH LAB;  Service: Cardiovascular;  Laterality: N/A;    Social History   Socioeconomic History  . Marital status: Single    Spouse name: Not on file  . Number of children: 2  . Years of education: Not on file  . Highest education level: Not on file  Occupational History    Employer: [employer]  Social Needs  . Financial resource strain: Very hard  . Food insecurity:    Worry: Patient refused    Inability: Patient refused  . Transportation needs:    Medical: Patient refused    Non-medical: Patient refused  Tobacco Use  . Smoking status: Never Smoker  . Smokeless tobacco: Never Used  Substance and Sexual Activity  . Alcohol use: Yes    Comment: Occasional  . Drug use: No  . Sexual activity: Not Currently  Lifestyle  . Physical activity:    Days per week: Not on file    Minutes per session: Not on file  . Stress: Not on file  Relationships  . Social connections:    Talks on phone: Not on file    Gets together: Not on file    Attends religious service: Not on file    Active member of club or organization: Not on file    Attends meetings of clubs or organizations: Not on file    Relationship status: Not on file  . Intimate partner violence:    Fear of current or ex partner: Not on file    Emotionally abused: Not on file    Physically abused: Not on file    Forced sexual activity: Not on file  Other Topics Concern  . Not on file  Social History Narrative  . Not on file    Family History  Problem Relation Age of Onset  . Coronary artery disease Unknown        No family history  . Heart disease Mother   . Heart disease Father   . Heart disease Maternal Grandmother     ROS: no fevers or chills, productive cough, hemoptysis, dysphasia, odynophagia, melena, hematochezia, dysuria, hematuria, rash, seizure activity, orthopnea, PND, pedal edema,  claudication. Remaining systems are negative.  Physical Exam: Well-developed well-nourished in no acute distress.  Skin is warm and dry.  HEENT is normal.  Neck is supple.  Chest is clear to auscultation with normal expansion.  Cardiovascular exam is regular rate and rhythm.  Abdominal exam nontender or distended. No masses palpated. Extremities show no edema. neuro grossly intact  ECG- personally reviewed  A/P  1 coronary artery disease-patient denies chest pain.  Plan to continue medical therapy with aspirin and statin.  2 hypertension-patient's blood pressure is controlled.  Continue present medications and follow.  3 hyperlipidemia-continue statin.  Check lipids and liver.  4 prior CVA-continue aspirin and Plavix.  5 obesity-we discussed the importance  of weight loss.  6 pancreatic mass noted on previous CT scan-Pancreas protocol MRI or CT recommended.  7 chronic diastolic congestive heart failure-this is felt predominantly right-sided heart failure from sleep apnea/obesity hypoventilation syndrome.  She appears to be euvolemic today.  Plan to continue present dose of diuretic.  Continue fluid restriction and low-sodium diet.  8 severe tricuspid regurgitation  Olga Millers, MD

## 2017-12-20 DIAGNOSIS — H1589 Other disorders of sclera: Secondary | ICD-10-CM | POA: Diagnosis not present

## 2017-12-20 DIAGNOSIS — I5032 Chronic diastolic (congestive) heart failure: Secondary | ICD-10-CM | POA: Diagnosis not present

## 2017-12-21 DIAGNOSIS — Z6841 Body Mass Index (BMI) 40.0 and over, adult: Secondary | ICD-10-CM | POA: Diagnosis not present

## 2017-12-21 DIAGNOSIS — E1140 Type 2 diabetes mellitus with diabetic neuropathy, unspecified: Secondary | ICD-10-CM | POA: Diagnosis not present

## 2017-12-21 DIAGNOSIS — N184 Chronic kidney disease, stage 4 (severe): Secondary | ICD-10-CM | POA: Diagnosis not present

## 2017-12-21 DIAGNOSIS — I5032 Chronic diastolic (congestive) heart failure: Secondary | ICD-10-CM | POA: Diagnosis not present

## 2017-12-24 DIAGNOSIS — N19 Unspecified kidney failure: Secondary | ICD-10-CM | POA: Diagnosis not present

## 2017-12-24 DIAGNOSIS — Z139 Encounter for screening, unspecified: Secondary | ICD-10-CM | POA: Diagnosis not present

## 2017-12-25 DIAGNOSIS — N19 Unspecified kidney failure: Secondary | ICD-10-CM | POA: Diagnosis not present

## 2017-12-25 DIAGNOSIS — Z139 Encounter for screening, unspecified: Secondary | ICD-10-CM | POA: Diagnosis not present

## 2017-12-27 ENCOUNTER — Ambulatory Visit: Payer: Medicare Other | Admitting: Cardiology

## 2018-01-02 DIAGNOSIS — M6281 Muscle weakness (generalized): Secondary | ICD-10-CM | POA: Diagnosis not present

## 2018-01-02 DIAGNOSIS — I5032 Chronic diastolic (congestive) heart failure: Secondary | ICD-10-CM | POA: Diagnosis not present

## 2018-01-02 DIAGNOSIS — E1140 Type 2 diabetes mellitus with diabetic neuropathy, unspecified: Secondary | ICD-10-CM | POA: Diagnosis not present

## 2018-01-06 DIAGNOSIS — E1122 Type 2 diabetes mellitus with diabetic chronic kidney disease: Secondary | ICD-10-CM | POA: Diagnosis not present

## 2018-01-06 DIAGNOSIS — E559 Vitamin D deficiency, unspecified: Secondary | ICD-10-CM | POA: Diagnosis not present

## 2018-01-06 DIAGNOSIS — I5032 Chronic diastolic (congestive) heart failure: Secondary | ICD-10-CM | POA: Diagnosis not present

## 2018-01-06 DIAGNOSIS — N184 Chronic kidney disease, stage 4 (severe): Secondary | ICD-10-CM | POA: Diagnosis not present

## 2018-01-06 DIAGNOSIS — I2510 Atherosclerotic heart disease of native coronary artery without angina pectoris: Secondary | ICD-10-CM | POA: Diagnosis not present

## 2018-01-06 DIAGNOSIS — M1990 Unspecified osteoarthritis, unspecified site: Secondary | ICD-10-CM | POA: Diagnosis not present

## 2018-01-06 DIAGNOSIS — G4733 Obstructive sleep apnea (adult) (pediatric): Secondary | ICD-10-CM | POA: Diagnosis not present

## 2018-01-06 DIAGNOSIS — I130 Hypertensive heart and chronic kidney disease with heart failure and stage 1 through stage 4 chronic kidney disease, or unspecified chronic kidney disease: Secondary | ICD-10-CM | POA: Diagnosis not present

## 2018-01-06 DIAGNOSIS — E785 Hyperlipidemia, unspecified: Secondary | ICD-10-CM | POA: Diagnosis not present

## 2018-01-06 DIAGNOSIS — D631 Anemia in chronic kidney disease: Secondary | ICD-10-CM | POA: Diagnosis not present

## 2018-01-06 DIAGNOSIS — I69354 Hemiplegia and hemiparesis following cerebral infarction affecting left non-dominant side: Secondary | ICD-10-CM | POA: Diagnosis not present

## 2018-01-06 DIAGNOSIS — Z6841 Body Mass Index (BMI) 40.0 and over, adult: Secondary | ICD-10-CM | POA: Diagnosis not present

## 2018-01-07 ENCOUNTER — Telehealth: Payer: Self-pay | Admitting: Nurse Practitioner

## 2018-01-07 NOTE — Telephone Encounter (Signed)
Copied from CRM [id number]. Topic: Quick Communication - See Telephone Encounter >> Jan 07, 2018  2:27 PM Herby AbrahamJohnson, Shiquita C wrote: CRM for notification. See Telephone encounter for: 01/07/18.  Marchelle FolksAmanda OT w/ [facility] calling in to request vo for home health services.   Frequency: 2 time for 4 weeks.   CB: [phone]: [phone] -

## 2018-01-07 NOTE — Telephone Encounter (Signed)
Routed incorrectly  °

## 2018-01-08 ENCOUNTER — Telehealth: Payer: Self-pay | Admitting: Cardiology

## 2018-01-08 ENCOUNTER — Telehealth: Payer: Self-pay | Admitting: Pulmonary Disease

## 2018-01-08 DIAGNOSIS — I639 Cerebral infarction, unspecified: Secondary | ICD-10-CM | POA: Diagnosis not present

## 2018-01-08 DIAGNOSIS — I5032 Chronic diastolic (congestive) heart failure: Secondary | ICD-10-CM | POA: Diagnosis not present

## 2018-01-08 DIAGNOSIS — I259 Chronic ischemic heart disease, unspecified: Secondary | ICD-10-CM | POA: Diagnosis not present

## 2018-01-08 DIAGNOSIS — I10 Essential (primary) hypertension: Secondary | ICD-10-CM | POA: Diagnosis not present

## 2018-01-08 NOTE — Telephone Encounter (Signed)
Called and spoke with Natalia LeatherwoodKatherine, with [facility].  She stated that the Patient had been discharged from the rehab center, 01/04/18.  She is by herself at home and has a friend that checks in on her.  She said that she was wanting to get her OT/PT to come out and work with the Patient 2 times a week for 1 week, then 1 time a week for 6 weeks.  She is also requesting a Nurse to go out and see her.  She is wanting to get SW involved and try to get community services, like meals on wheels, and a ramp built for her.  She is requesting a call back with [facility] [phone].  Will route message to [facility]

## 2018-01-08 NOTE — Telephone Encounter (Signed)
Megan Atkinson is aware.

## 2018-01-08 NOTE — Telephone Encounter (Signed)
Orders need to come from primary care Megan Atkinson

## 2018-01-08 NOTE — Telephone Encounter (Signed)
New Message   Santina EvansCatherine with [facility] is calling to get orders for skilled nursing, social work, home health aid, occupational therapy and physical therapy. As well as a chest xray and CBC (these can be done portable) due to patient being bed bound. Please call to discuss.

## 2018-01-08 NOTE — Telephone Encounter (Signed)
Spoke with catherine, Aware of dr Ludwig Clarkscrenshaw's recommendations.

## 2018-01-08 NOTE — Telephone Encounter (Signed)
SPOKE TO  HOME NURSE -  PATIENT RECENTLY RELEASED FROM SNF-   PER NURSE, ORDERS FROM  NURSING FACILTY IS THAT PATIENT NEEDS A BMP ,CBC ,2-VIEW CHEST X-RAY- ALL CAN BE DONE BY  AGENCY AT PATIENT 'S HOME SINCE  HOMEBOUND.  ALL RESULTS FOR DR CRENSHAW TO REVIEW.  NURSE WAS CALLING FOR ORDER OF THERE ABOVE AS WELL OTHER AWARE WILL DEFER TO DR Jens SomRENSHAW

## 2018-01-08 NOTE — Telephone Encounter (Signed)
Spoke with [employer] rep and she states she doesn't see the patient in their system at all. I advised her that a rep named Thayer OhmChris called and she still had no recollection of who he was.  I called pt to get more information but the line was busy X 3. Will try again later.

## 2018-01-08 NOTE — Telephone Encounter (Signed)
ok 

## 2018-01-08 NOTE — Telephone Encounter (Signed)
Left vm for the [facility] to [facility] back.

## 2018-01-09 ENCOUNTER — Telehealth: Payer: Self-pay | Admitting: Nurse Practitioner

## 2018-01-09 NOTE — Telephone Encounter (Signed)
Please advise.     Copied from CRM [id number]. Topic: Quick Communication - See Telephone Encounter >> Jan 07, 2018  2:27 PM Herby AbrahamJohnson, Shiquita C wrote: CRM for notification. See Telephone encounter for: 01/07/18.  Marchelle FolksAmanda OT w/ [facility] calling in to request vo for home health services.   Frequency: 2 time for 4 weeks.   CB: [phone]: [phone] - >> Jan 09, 2018  4:37 PM Trula SladeWalter, Linda F wrote: Littie Deedsatherine Blackburn RN w/[facility] [phone] needs verbal orders for OT, Skilled Nursing, PT, Aid and a Child psychotherapistocial Worker.

## 2018-01-09 NOTE — Telephone Encounter (Signed)
ok 

## 2018-01-09 NOTE — Telephone Encounter (Signed)
This has to be sent to her PCP

## 2018-01-09 NOTE — Telephone Encounter (Signed)
Called and spoke with Natalia LeatherwoodKatherine letting her know that RA stated PCP is to take care of what they were needing for pt.  Per Natalia LeatherwoodKatherine, she called PCP first and was then told by PCP that Pulmonary had to be the ones to handle the orders for pt. I stated to Natalia LeatherwoodKatherine that RA stated that PCP is the one to take care of orders for pt. Natalia LeatherwoodKatherine expressed understanding and stated she would call PCP again. Nothing further needed.

## 2018-01-10 ENCOUNTER — Telehealth: Payer: Self-pay | Admitting: Nurse Practitioner

## 2018-01-10 NOTE — Telephone Encounter (Signed)
Copied from CRM [id number]. Topic: Quick Communication - See Telephone Encounter >> Jan 10, 2018 11:19 AM Maia Pettiesrtiz, Kristie S wrote: CRM for notification. See Telephone encounter for: 01/10/18.  Servando SnareSyna Barber RN with [facility] called to notify Megan Pennaharlotte Nche, NP that pt has enrolled in program thru her insurance for NP home visits 4x per year. They are required to notify PCP. Pt was visited today and doing well.

## 2018-01-10 NOTE — Telephone Encounter (Signed)
Pt has not been seen at our office in over a year so we are unable to do anything in regards to set up an appt for delivery of Rituxan.  Called [employer] and spoke with Byrd HesselbachMaria in regards to the message we received from [place]. Per Byrd HesselbachMaria, they are unable to locate pt in their system. I stated to Byrd HesselbachMaria that we would not be able to do anything in regards to pt's Rituxan due to her not being seen at our office in over a year. Byrd HesselbachMaria stated she would try to see if she could locate pt's information with the foundation program and she stated she would place a note in pt's chart with the information stated by me. Nothing further needed.

## 2018-01-10 NOTE — Telephone Encounter (Signed)
FYi

## 2018-01-10 NOTE — Telephone Encounter (Signed)
Megan Atkinson is aware.

## 2018-01-11 DIAGNOSIS — E118 Type 2 diabetes mellitus with unspecified complications: Secondary | ICD-10-CM | POA: Diagnosis not present

## 2018-01-11 DIAGNOSIS — Z8673 Personal history of transient ischemic attack (TIA), and cerebral infarction without residual deficits: Secondary | ICD-10-CM | POA: Diagnosis not present

## 2018-01-11 DIAGNOSIS — Z7902 Long term (current) use of antithrombotics/antiplatelets: Secondary | ICD-10-CM | POA: Diagnosis not present

## 2018-01-11 DIAGNOSIS — I5032 Chronic diastolic (congestive) heart failure: Secondary | ICD-10-CM | POA: Diagnosis not present

## 2018-01-16 ENCOUNTER — Other Ambulatory Visit: Payer: Self-pay | Admitting: Nurse Practitioner

## 2018-02-08 DIAGNOSIS — I639 Cerebral infarction, unspecified: Secondary | ICD-10-CM | POA: Diagnosis not present

## 2018-02-08 DIAGNOSIS — I10 Essential (primary) hypertension: Secondary | ICD-10-CM | POA: Diagnosis not present

## 2018-02-08 DIAGNOSIS — I259 Chronic ischemic heart disease, unspecified: Secondary | ICD-10-CM | POA: Diagnosis not present

## 2018-02-08 DIAGNOSIS — I5032 Chronic diastolic (congestive) heart failure: Secondary | ICD-10-CM | POA: Diagnosis not present

## 2018-02-23 DIAGNOSIS — E1165 Type 2 diabetes mellitus with hyperglycemia: Secondary | ICD-10-CM | POA: Diagnosis not present

## 2018-02-23 DIAGNOSIS — M6281 Muscle weakness (generalized): Secondary | ICD-10-CM | POA: Diagnosis not present

## 2018-02-23 DIAGNOSIS — I5032 Chronic diastolic (congestive) heart failure: Secondary | ICD-10-CM | POA: Diagnosis not present

## 2018-02-23 DIAGNOSIS — Z8673 Personal history of transient ischemic attack (TIA), and cerebral infarction without residual deficits: Secondary | ICD-10-CM | POA: Diagnosis not present

## 2018-03-11 DIAGNOSIS — I5032 Chronic diastolic (congestive) heart failure: Secondary | ICD-10-CM | POA: Diagnosis not present

## 2018-03-11 DIAGNOSIS — I10 Essential (primary) hypertension: Secondary | ICD-10-CM | POA: Diagnosis not present

## 2018-03-11 DIAGNOSIS — I259 Chronic ischemic heart disease, unspecified: Secondary | ICD-10-CM | POA: Diagnosis not present

## 2018-03-11 DIAGNOSIS — I639 Cerebral infarction, unspecified: Secondary | ICD-10-CM | POA: Diagnosis not present

## 2018-04-09 DIAGNOSIS — I10 Essential (primary) hypertension: Secondary | ICD-10-CM | POA: Diagnosis not present

## 2018-04-09 DIAGNOSIS — J9611 Chronic respiratory failure with hypoxia: Secondary | ICD-10-CM | POA: Diagnosis not present

## 2018-04-09 DIAGNOSIS — N184 Chronic kidney disease, stage 4 (severe): Secondary | ICD-10-CM | POA: Diagnosis not present

## 2018-04-09 DIAGNOSIS — I259 Chronic ischemic heart disease, unspecified: Secondary | ICD-10-CM | POA: Diagnosis not present

## 2018-04-09 DIAGNOSIS — E118 Type 2 diabetes mellitus with unspecified complications: Secondary | ICD-10-CM | POA: Diagnosis not present

## 2018-04-09 DIAGNOSIS — I639 Cerebral infarction, unspecified: Secondary | ICD-10-CM | POA: Diagnosis not present

## 2018-04-09 DIAGNOSIS — I5032 Chronic diastolic (congestive) heart failure: Secondary | ICD-10-CM | POA: Diagnosis not present

## 2018-04-25 ENCOUNTER — Telehealth: Payer: Self-pay | Admitting: Nurse Practitioner

## 2018-04-25 DIAGNOSIS — E1122 Type 2 diabetes mellitus with diabetic chronic kidney disease: Secondary | ICD-10-CM | POA: Diagnosis not present

## 2018-04-25 DIAGNOSIS — Z6836 Body mass index (BMI) 36.0-36.9, adult: Secondary | ICD-10-CM | POA: Diagnosis not present

## 2018-04-25 DIAGNOSIS — Z7984 Long term (current) use of oral hypoglycemic drugs: Secondary | ICD-10-CM | POA: Diagnosis not present

## 2018-04-25 DIAGNOSIS — I5032 Chronic diastolic (congestive) heart failure: Secondary | ICD-10-CM | POA: Diagnosis not present

## 2018-04-25 DIAGNOSIS — Z7902 Long term (current) use of antithrombotics/antiplatelets: Secondary | ICD-10-CM | POA: Diagnosis not present

## 2018-04-25 DIAGNOSIS — E119 Type 2 diabetes mellitus without complications: Secondary | ICD-10-CM | POA: Diagnosis not present

## 2018-04-25 DIAGNOSIS — I130 Hypertensive heart and chronic kidney disease with heart failure and stage 1 through stage 4 chronic kidney disease, or unspecified chronic kidney disease: Secondary | ICD-10-CM | POA: Diagnosis not present

## 2018-04-25 DIAGNOSIS — Z87891 Personal history of nicotine dependence: Secondary | ICD-10-CM | POA: Diagnosis not present

## 2018-04-25 DIAGNOSIS — N184 Chronic kidney disease, stage 4 (severe): Secondary | ICD-10-CM | POA: Diagnosis not present

## 2018-04-25 DIAGNOSIS — Z8673 Personal history of transient ischemic attack (TIA), and cerebral infarction without residual deficits: Secondary | ICD-10-CM | POA: Diagnosis not present

## 2018-04-25 DIAGNOSIS — E785 Hyperlipidemia, unspecified: Secondary | ICD-10-CM | POA: Diagnosis not present

## 2018-04-25 DIAGNOSIS — I2510 Atherosclerotic heart disease of native coronary artery without angina pectoris: Secondary | ICD-10-CM | POA: Diagnosis not present

## 2018-04-25 DIAGNOSIS — G4733 Obstructive sleep apnea (adult) (pediatric): Secondary | ICD-10-CM | POA: Diagnosis not present

## 2018-04-25 LAB — CBC AND DIFFERENTIAL
HCT: 52 — AB (ref 36–46)
Hemoglobin: 15.5 (ref 12.0–16.0)
Neutrophils Absolute: 4
Platelets: 193 (ref 150–399)
WBC: 6.3

## 2018-04-25 LAB — BASIC METABOLIC PANEL
BUN: 15 (ref 4–21)
Creatinine: 0.9 (ref 0.5–1.1)
SODIUM: 146 (ref 137–147)

## 2018-04-25 NOTE — Telephone Encounter (Signed)
Copied from CRM [id number]. Topic: Quick Communication - See Telephone Encounter >> Apr 25, 2018 10:15 AM Herby Abraham C wrote: CRM for notification. See Telephone encounter for: 04/25/18.  Darlene / home health - [phone] - okay to lvm if no answer   Requesting PT for eval and nursing 1 x 4 weeks.

## 2018-04-25 NOTE — Telephone Encounter (Signed)
As of 06/2018, I will not longer be able to give any very orders for this patient. She has not been since last OV 06/2017 and hospital discharge. For now, ok to proceed with home PT and nursing x 4weeks only. For any additional orders, she will have to be seen in office prior to authorizing.

## 2018-04-25 NOTE — Telephone Encounter (Signed)
Mrs. Megan Atkinson is aware of message below. She also request social order as well and Chalotte okey with it.

## 2018-05-01 ENCOUNTER — Encounter: Payer: Self-pay | Admitting: Nurse Practitioner

## 2018-05-01 NOTE — Progress Notes (Signed)
Abstracted result and sent to scan  

## 2018-05-02 DIAGNOSIS — I69354 Hemiplegia and hemiparesis following cerebral infarction affecting left non-dominant side: Secondary | ICD-10-CM | POA: Diagnosis not present

## 2018-05-02 DIAGNOSIS — N184 Chronic kidney disease, stage 4 (severe): Secondary | ICD-10-CM | POA: Diagnosis not present

## 2018-05-02 DIAGNOSIS — I130 Hypertensive heart and chronic kidney disease with heart failure and stage 1 through stage 4 chronic kidney disease, or unspecified chronic kidney disease: Secondary | ICD-10-CM | POA: Diagnosis not present

## 2018-05-02 DIAGNOSIS — I5032 Chronic diastolic (congestive) heart failure: Secondary | ICD-10-CM | POA: Diagnosis not present

## 2018-05-10 DIAGNOSIS — I639 Cerebral infarction, unspecified: Secondary | ICD-10-CM | POA: Diagnosis not present

## 2018-05-10 DIAGNOSIS — I5032 Chronic diastolic (congestive) heart failure: Secondary | ICD-10-CM | POA: Diagnosis not present

## 2018-05-10 DIAGNOSIS — I259 Chronic ischemic heart disease, unspecified: Secondary | ICD-10-CM | POA: Diagnosis not present

## 2018-05-10 DIAGNOSIS — I10 Essential (primary) hypertension: Secondary | ICD-10-CM | POA: Diagnosis not present

## 2018-05-27 ENCOUNTER — Encounter: Payer: Self-pay | Admitting: Nurse Practitioner

## 2018-05-27 ENCOUNTER — Ambulatory Visit (INDEPENDENT_AMBULATORY_CARE_PROVIDER_SITE_OTHER): Payer: Medicare Other | Admitting: Nurse Practitioner

## 2018-05-27 VITALS — BP 128/68 | Temp 98.6°F | Ht 67.0 in

## 2018-05-27 DIAGNOSIS — I2510 Atherosclerotic heart disease of native coronary artery without angina pectoris: Secondary | ICD-10-CM | POA: Diagnosis not present

## 2018-05-27 DIAGNOSIS — Z87891 Personal history of nicotine dependence: Secondary | ICD-10-CM | POA: Diagnosis not present

## 2018-05-27 DIAGNOSIS — Z8673 Personal history of transient ischemic attack (TIA), and cerebral infarction without residual deficits: Secondary | ICD-10-CM | POA: Diagnosis not present

## 2018-05-27 DIAGNOSIS — Z7984 Long term (current) use of oral hypoglycemic drugs: Secondary | ICD-10-CM | POA: Diagnosis not present

## 2018-05-27 DIAGNOSIS — E785 Hyperlipidemia, unspecified: Secondary | ICD-10-CM | POA: Diagnosis not present

## 2018-05-27 DIAGNOSIS — I130 Hypertensive heart and chronic kidney disease with heart failure and stage 1 through stage 4 chronic kidney disease, or unspecified chronic kidney disease: Secondary | ICD-10-CM | POA: Diagnosis not present

## 2018-05-27 DIAGNOSIS — Z7902 Long term (current) use of antithrombotics/antiplatelets: Secondary | ICD-10-CM | POA: Diagnosis not present

## 2018-05-27 DIAGNOSIS — E1122 Type 2 diabetes mellitus with diabetic chronic kidney disease: Secondary | ICD-10-CM | POA: Diagnosis not present

## 2018-05-27 DIAGNOSIS — I10 Essential (primary) hypertension: Secondary | ICD-10-CM | POA: Diagnosis not present

## 2018-05-27 DIAGNOSIS — I5032 Chronic diastolic (congestive) heart failure: Secondary | ICD-10-CM | POA: Diagnosis not present

## 2018-05-27 DIAGNOSIS — G4733 Obstructive sleep apnea (adult) (pediatric): Secondary | ICD-10-CM | POA: Diagnosis not present

## 2018-05-27 DIAGNOSIS — Z6836 Body mass index (BMI) 36.0-36.9, adult: Secondary | ICD-10-CM | POA: Diagnosis not present

## 2018-05-27 DIAGNOSIS — N184 Chronic kidney disease, stage 4 (severe): Secondary | ICD-10-CM | POA: Diagnosis not present

## 2018-05-27 NOTE — Progress Notes (Signed)
Virtual Visit via Telephone Note  I connected with Megan Atkinson on 05/27/18 at  2:00 PM EDT by telephone and verified that I am speaking with the correct person using two identifiers.   I discussed the limitations, risks, security and privacy concerns of performing an evaluation and management service by telephone and the availability of in person appointments. I also discussed with the patient that there may be a patient responsible charge related to this service. The patient expressed understanding and agreed to proceed.  CC: HTN and DM f/up.  History of Present Illness: Megan Atkinson is at home today alone. She is also visited frequently by her daughter in law and sons. She is assisted daily by Home health personnel. She also reports she difficulty with transportation, because transportation service is unable to transport her due to large size of her wheelchair. [facility], hence no longer needs my services. A provide comes to her home every 95months, provides medication refills and routine lab draws.  She denies any acute complaints   Observations/Objective: Reviewed vital signs provided Alert and oriented x 4, clear speech and normal voice tone.  Assessment and Plan: Megan Atkinson was seen today for follow-up.  Diagnoses and all orders for this visit:  Benign essential HTN    Follow Up Instructions: Continue care with [facility].   I discussed the assessment and treatment plan with the patient. The patient was provided an opportunity to ask questions and all were answered. The patient agreed with the plan and demonstrated an understanding of the instructions.   The patient was advised to call back or seek an in-person evaluation if the symptoms worsen or if the condition fails to improve as anticipated.  I provided 10 minutes of non-face-to-face time during this encounter.   Alysia Penna, NP

## 2018-06-02 DIAGNOSIS — I130 Hypertensive heart and chronic kidney disease with heart failure and stage 1 through stage 4 chronic kidney disease, or unspecified chronic kidney disease: Secondary | ICD-10-CM | POA: Diagnosis not present

## 2018-06-02 DIAGNOSIS — I5032 Chronic diastolic (congestive) heart failure: Secondary | ICD-10-CM | POA: Diagnosis not present

## 2018-06-02 DIAGNOSIS — I69354 Hemiplegia and hemiparesis following cerebral infarction affecting left non-dominant side: Secondary | ICD-10-CM | POA: Diagnosis not present

## 2018-06-02 DIAGNOSIS — N184 Chronic kidney disease, stage 4 (severe): Secondary | ICD-10-CM | POA: Diagnosis not present

## 2018-06-04 DIAGNOSIS — Z7902 Long term (current) use of antithrombotics/antiplatelets: Secondary | ICD-10-CM | POA: Diagnosis not present

## 2018-06-04 DIAGNOSIS — J9611 Chronic respiratory failure with hypoxia: Secondary | ICD-10-CM | POA: Diagnosis not present

## 2018-06-04 DIAGNOSIS — E118 Type 2 diabetes mellitus with unspecified complications: Secondary | ICD-10-CM | POA: Diagnosis not present

## 2018-06-04 DIAGNOSIS — I509 Heart failure, unspecified: Secondary | ICD-10-CM | POA: Diagnosis not present

## 2018-06-04 DIAGNOSIS — Z7189 Other specified counseling: Secondary | ICD-10-CM | POA: Diagnosis not present

## 2018-06-09 DIAGNOSIS — I10 Essential (primary) hypertension: Secondary | ICD-10-CM | POA: Diagnosis not present

## 2018-06-09 DIAGNOSIS — I259 Chronic ischemic heart disease, unspecified: Secondary | ICD-10-CM | POA: Diagnosis not present

## 2018-06-09 DIAGNOSIS — I5032 Chronic diastolic (congestive) heart failure: Secondary | ICD-10-CM | POA: Diagnosis not present

## 2018-06-09 DIAGNOSIS — I639 Cerebral infarction, unspecified: Secondary | ICD-10-CM | POA: Diagnosis not present

## 2018-07-02 DIAGNOSIS — I130 Hypertensive heart and chronic kidney disease with heart failure and stage 1 through stage 4 chronic kidney disease, or unspecified chronic kidney disease: Secondary | ICD-10-CM | POA: Diagnosis not present

## 2018-07-02 DIAGNOSIS — I5032 Chronic diastolic (congestive) heart failure: Secondary | ICD-10-CM | POA: Diagnosis not present

## 2018-07-02 DIAGNOSIS — I69354 Hemiplegia and hemiparesis following cerebral infarction affecting left non-dominant side: Secondary | ICD-10-CM | POA: Diagnosis not present

## 2018-07-02 DIAGNOSIS — N184 Chronic kidney disease, stage 4 (severe): Secondary | ICD-10-CM | POA: Diagnosis not present

## 2018-07-10 DIAGNOSIS — I639 Cerebral infarction, unspecified: Secondary | ICD-10-CM | POA: Diagnosis not present

## 2018-07-10 DIAGNOSIS — I259 Chronic ischemic heart disease, unspecified: Secondary | ICD-10-CM | POA: Diagnosis not present

## 2018-07-10 DIAGNOSIS — I5032 Chronic diastolic (congestive) heart failure: Secondary | ICD-10-CM | POA: Diagnosis not present

## 2018-07-10 DIAGNOSIS — I10 Essential (primary) hypertension: Secondary | ICD-10-CM | POA: Diagnosis not present

## 2018-07-25 DIAGNOSIS — E118 Type 2 diabetes mellitus with unspecified complications: Secondary | ICD-10-CM | POA: Diagnosis not present

## 2018-07-25 DIAGNOSIS — I509 Heart failure, unspecified: Secondary | ICD-10-CM | POA: Diagnosis not present

## 2018-07-25 DIAGNOSIS — Z7902 Long term (current) use of antithrombotics/antiplatelets: Secondary | ICD-10-CM | POA: Diagnosis not present

## 2018-07-25 DIAGNOSIS — J9611 Chronic respiratory failure with hypoxia: Secondary | ICD-10-CM | POA: Diagnosis not present

## 2018-08-02 DIAGNOSIS — N184 Chronic kidney disease, stage 4 (severe): Secondary | ICD-10-CM | POA: Diagnosis not present

## 2018-08-02 DIAGNOSIS — I130 Hypertensive heart and chronic kidney disease with heart failure and stage 1 through stage 4 chronic kidney disease, or unspecified chronic kidney disease: Secondary | ICD-10-CM | POA: Diagnosis not present

## 2018-08-02 DIAGNOSIS — I5032 Chronic diastolic (congestive) heart failure: Secondary | ICD-10-CM | POA: Diagnosis not present

## 2018-08-02 DIAGNOSIS — I69354 Hemiplegia and hemiparesis following cerebral infarction affecting left non-dominant side: Secondary | ICD-10-CM | POA: Diagnosis not present

## 2018-08-09 DIAGNOSIS — I639 Cerebral infarction, unspecified: Secondary | ICD-10-CM | POA: Diagnosis not present

## 2018-08-09 DIAGNOSIS — I10 Essential (primary) hypertension: Secondary | ICD-10-CM | POA: Diagnosis not present

## 2018-08-09 DIAGNOSIS — I5032 Chronic diastolic (congestive) heart failure: Secondary | ICD-10-CM | POA: Diagnosis not present

## 2018-08-09 DIAGNOSIS — I259 Chronic ischemic heart disease, unspecified: Secondary | ICD-10-CM | POA: Diagnosis not present

## 2018-08-22 DIAGNOSIS — I10 Essential (primary) hypertension: Secondary | ICD-10-CM | POA: Diagnosis not present

## 2018-08-22 DIAGNOSIS — J9611 Chronic respiratory failure with hypoxia: Secondary | ICD-10-CM | POA: Diagnosis not present

## 2018-08-22 DIAGNOSIS — E118 Type 2 diabetes mellitus with unspecified complications: Secondary | ICD-10-CM | POA: Diagnosis not present

## 2018-08-22 DIAGNOSIS — I509 Heart failure, unspecified: Secondary | ICD-10-CM | POA: Diagnosis not present

## 2018-09-01 DIAGNOSIS — I5032 Chronic diastolic (congestive) heart failure: Secondary | ICD-10-CM | POA: Diagnosis not present

## 2018-09-01 DIAGNOSIS — I130 Hypertensive heart and chronic kidney disease with heart failure and stage 1 through stage 4 chronic kidney disease, or unspecified chronic kidney disease: Secondary | ICD-10-CM | POA: Diagnosis not present

## 2018-09-01 DIAGNOSIS — N184 Chronic kidney disease, stage 4 (severe): Secondary | ICD-10-CM | POA: Diagnosis not present

## 2018-09-01 DIAGNOSIS — I69354 Hemiplegia and hemiparesis following cerebral infarction affecting left non-dominant side: Secondary | ICD-10-CM | POA: Diagnosis not present

## 2018-09-09 DIAGNOSIS — I259 Chronic ischemic heart disease, unspecified: Secondary | ICD-10-CM | POA: Diagnosis not present

## 2018-09-09 DIAGNOSIS — I639 Cerebral infarction, unspecified: Secondary | ICD-10-CM | POA: Diagnosis not present

## 2018-09-09 DIAGNOSIS — I5032 Chronic diastolic (congestive) heart failure: Secondary | ICD-10-CM | POA: Diagnosis not present

## 2018-09-09 DIAGNOSIS — I10 Essential (primary) hypertension: Secondary | ICD-10-CM | POA: Diagnosis not present

## 2018-09-20 DIAGNOSIS — J9611 Chronic respiratory failure with hypoxia: Secondary | ICD-10-CM | POA: Diagnosis not present

## 2018-09-20 DIAGNOSIS — E118 Type 2 diabetes mellitus with unspecified complications: Secondary | ICD-10-CM | POA: Diagnosis not present

## 2018-09-20 DIAGNOSIS — M6281 Muscle weakness (generalized): Secondary | ICD-10-CM | POA: Diagnosis not present

## 2018-09-20 DIAGNOSIS — I5032 Chronic diastolic (congestive) heart failure: Secondary | ICD-10-CM | POA: Diagnosis not present

## 2018-10-02 DIAGNOSIS — I69354 Hemiplegia and hemiparesis following cerebral infarction affecting left non-dominant side: Secondary | ICD-10-CM | POA: Diagnosis not present

## 2018-10-02 DIAGNOSIS — N184 Chronic kidney disease, stage 4 (severe): Secondary | ICD-10-CM | POA: Diagnosis not present

## 2018-10-02 DIAGNOSIS — I130 Hypertensive heart and chronic kidney disease with heart failure and stage 1 through stage 4 chronic kidney disease, or unspecified chronic kidney disease: Secondary | ICD-10-CM | POA: Diagnosis not present

## 2018-10-02 DIAGNOSIS — I5032 Chronic diastolic (congestive) heart failure: Secondary | ICD-10-CM | POA: Diagnosis not present

## 2018-10-10 DIAGNOSIS — I5032 Chronic diastolic (congestive) heart failure: Secondary | ICD-10-CM | POA: Diagnosis not present

## 2018-10-10 DIAGNOSIS — I259 Chronic ischemic heart disease, unspecified: Secondary | ICD-10-CM | POA: Diagnosis not present

## 2018-10-10 DIAGNOSIS — I10 Essential (primary) hypertension: Secondary | ICD-10-CM | POA: Diagnosis not present

## 2018-10-10 DIAGNOSIS — I639 Cerebral infarction, unspecified: Secondary | ICD-10-CM | POA: Diagnosis not present

## 2018-11-02 DIAGNOSIS — I69354 Hemiplegia and hemiparesis following cerebral infarction affecting left non-dominant side: Secondary | ICD-10-CM | POA: Diagnosis not present

## 2018-11-02 DIAGNOSIS — I5032 Chronic diastolic (congestive) heart failure: Secondary | ICD-10-CM | POA: Diagnosis not present

## 2018-11-02 DIAGNOSIS — I130 Hypertensive heart and chronic kidney disease with heart failure and stage 1 through stage 4 chronic kidney disease, or unspecified chronic kidney disease: Secondary | ICD-10-CM | POA: Diagnosis not present

## 2018-11-02 DIAGNOSIS — N184 Chronic kidney disease, stage 4 (severe): Secondary | ICD-10-CM | POA: Diagnosis not present

## 2018-11-04 DIAGNOSIS — Z23 Encounter for immunization: Secondary | ICD-10-CM | POA: Diagnosis not present

## 2018-11-09 DIAGNOSIS — I639 Cerebral infarction, unspecified: Secondary | ICD-10-CM | POA: Diagnosis not present

## 2018-11-09 DIAGNOSIS — I5032 Chronic diastolic (congestive) heart failure: Secondary | ICD-10-CM | POA: Diagnosis not present

## 2018-11-09 DIAGNOSIS — I259 Chronic ischemic heart disease, unspecified: Secondary | ICD-10-CM | POA: Diagnosis not present

## 2018-11-09 DIAGNOSIS — I10 Essential (primary) hypertension: Secondary | ICD-10-CM | POA: Diagnosis not present

## 2018-11-13 DIAGNOSIS — E118 Type 2 diabetes mellitus with unspecified complications: Secondary | ICD-10-CM | POA: Diagnosis not present

## 2018-11-13 DIAGNOSIS — Z7902 Long term (current) use of antithrombotics/antiplatelets: Secondary | ICD-10-CM | POA: Diagnosis not present

## 2018-11-13 DIAGNOSIS — B372 Candidiasis of skin and nail: Secondary | ICD-10-CM | POA: Diagnosis not present

## 2018-11-13 DIAGNOSIS — I5032 Chronic diastolic (congestive) heart failure: Secondary | ICD-10-CM | POA: Diagnosis not present

## 2018-12-05 DIAGNOSIS — I639 Cerebral infarction, unspecified: Secondary | ICD-10-CM | POA: Diagnosis not present

## 2018-12-05 DIAGNOSIS — I10 Essential (primary) hypertension: Secondary | ICD-10-CM | POA: Diagnosis not present

## 2018-12-05 DIAGNOSIS — G4733 Obstructive sleep apnea (adult) (pediatric): Secondary | ICD-10-CM | POA: Diagnosis not present

## 2018-12-05 DIAGNOSIS — I259 Chronic ischemic heart disease, unspecified: Secondary | ICD-10-CM | POA: Diagnosis not present

## 2018-12-11 DIAGNOSIS — J9611 Chronic respiratory failure with hypoxia: Secondary | ICD-10-CM | POA: Diagnosis not present

## 2018-12-11 DIAGNOSIS — I5032 Chronic diastolic (congestive) heart failure: Secondary | ICD-10-CM | POA: Diagnosis not present

## 2018-12-11 DIAGNOSIS — E785 Hyperlipidemia, unspecified: Secondary | ICD-10-CM | POA: Diagnosis not present

## 2018-12-11 DIAGNOSIS — N184 Chronic kidney disease, stage 4 (severe): Secondary | ICD-10-CM | POA: Diagnosis not present

## 2018-12-11 DIAGNOSIS — I4891 Unspecified atrial fibrillation: Secondary | ICD-10-CM | POA: Diagnosis not present

## 2018-12-11 DIAGNOSIS — E1122 Type 2 diabetes mellitus with diabetic chronic kidney disease: Secondary | ICD-10-CM | POA: Diagnosis not present

## 2018-12-11 DIAGNOSIS — R32 Unspecified urinary incontinence: Secondary | ICD-10-CM | POA: Diagnosis not present

## 2018-12-11 DIAGNOSIS — M1990 Unspecified osteoarthritis, unspecified site: Secondary | ICD-10-CM | POA: Diagnosis not present

## 2018-12-11 DIAGNOSIS — I130 Hypertensive heart and chronic kidney disease with heart failure and stage 1 through stage 4 chronic kidney disease, or unspecified chronic kidney disease: Secondary | ICD-10-CM | POA: Diagnosis not present

## 2018-12-11 DIAGNOSIS — G4733 Obstructive sleep apnea (adult) (pediatric): Secondary | ICD-10-CM | POA: Diagnosis not present

## 2018-12-11 DIAGNOSIS — Z466 Encounter for fitting and adjustment of urinary device: Secondary | ICD-10-CM | POA: Diagnosis not present

## 2018-12-11 DIAGNOSIS — I69354 Hemiplegia and hemiparesis following cerebral infarction affecting left non-dominant side: Secondary | ICD-10-CM | POA: Diagnosis not present

## 2018-12-11 DIAGNOSIS — K869 Disease of pancreas, unspecified: Secondary | ICD-10-CM | POA: Diagnosis not present

## 2018-12-11 DIAGNOSIS — I2510 Atherosclerotic heart disease of native coronary artery without angina pectoris: Secondary | ICD-10-CM | POA: Diagnosis not present

## 2018-12-11 DIAGNOSIS — B372 Candidiasis of skin and nail: Secondary | ICD-10-CM | POA: Diagnosis not present

## 2018-12-17 DIAGNOSIS — J9611 Chronic respiratory failure with hypoxia: Secondary | ICD-10-CM | POA: Diagnosis not present

## 2018-12-17 DIAGNOSIS — I10 Essential (primary) hypertension: Secondary | ICD-10-CM | POA: Diagnosis not present

## 2018-12-17 DIAGNOSIS — I509 Heart failure, unspecified: Secondary | ICD-10-CM | POA: Diagnosis not present

## 2018-12-17 DIAGNOSIS — E118 Type 2 diabetes mellitus with unspecified complications: Secondary | ICD-10-CM | POA: Diagnosis not present

## 2019-01-24 DIAGNOSIS — I5032 Chronic diastolic (congestive) heart failure: Secondary | ICD-10-CM | POA: Diagnosis not present

## 2019-01-24 DIAGNOSIS — I10 Essential (primary) hypertension: Secondary | ICD-10-CM | POA: Diagnosis not present

## 2019-01-24 DIAGNOSIS — J9611 Chronic respiratory failure with hypoxia: Secondary | ICD-10-CM | POA: Diagnosis not present

## 2019-01-24 DIAGNOSIS — E118 Type 2 diabetes mellitus with unspecified complications: Secondary | ICD-10-CM | POA: Diagnosis not present

## 2019-02-09 DIAGNOSIS — I639 Cerebral infarction, unspecified: Secondary | ICD-10-CM | POA: Diagnosis not present

## 2019-02-09 DIAGNOSIS — I259 Chronic ischemic heart disease, unspecified: Secondary | ICD-10-CM | POA: Diagnosis not present

## 2019-02-09 DIAGNOSIS — I5032 Chronic diastolic (congestive) heart failure: Secondary | ICD-10-CM | POA: Diagnosis not present

## 2019-02-09 DIAGNOSIS — I10 Essential (primary) hypertension: Secondary | ICD-10-CM | POA: Diagnosis not present

## 2019-03-22 DIAGNOSIS — E1165 Type 2 diabetes mellitus with hyperglycemia: Secondary | ICD-10-CM | POA: Diagnosis not present

## 2019-05-01 ENCOUNTER — Encounter: Payer: Self-pay | Admitting: Cardiology

## 2019-05-01 ENCOUNTER — Telehealth (INDEPENDENT_AMBULATORY_CARE_PROVIDER_SITE_OTHER): Payer: Medicare Other | Admitting: Cardiology

## 2019-05-01 VITALS — Ht 67.0 in

## 2019-05-01 DIAGNOSIS — J9610 Chronic respiratory failure, unspecified whether with hypoxia or hypercapnia: Secondary | ICD-10-CM

## 2019-05-01 DIAGNOSIS — I10 Essential (primary) hypertension: Secondary | ICD-10-CM

## 2019-05-01 DIAGNOSIS — Z951 Presence of aortocoronary bypass graft: Secondary | ICD-10-CM | POA: Diagnosis not present

## 2019-05-01 DIAGNOSIS — E119 Type 2 diabetes mellitus without complications: Secondary | ICD-10-CM

## 2019-05-01 DIAGNOSIS — G4733 Obstructive sleep apnea (adult) (pediatric): Secondary | ICD-10-CM

## 2019-05-01 NOTE — Patient Instructions (Signed)
Medication Instructions:  Your physician recommends that you continue on your current medications as directed. Please refer to the Current Medication list given to you today.  *If you need a refill on your cardiac medications before your next appointment, please call your pharmacy*   Follow-Up: At [facility], you and your health needs are our priority.  As part of our continuing mission to provide you with exceptional heart care, we have created designated Provider Care Teams.  These Care Teams include your primary Cardiologist (physician) and Advanced Practice Providers (APPs -  Physician Assistants and Nurse Practitioners) who all work together to provide you with the care you need, when you need it.  We recommend signing up for the patient portal called "MyChart".  Sign up information is provided on this After Visit Summary.  MyChart is used to connect with patients for Virtual Visits (Telemedicine).  Patients are able to view lab/test results, encounter notes, upcoming appointments, etc.  Non-urgent messages can be sent to your provider as well.   To learn more about what you can do with MyChart, go to [email or web address].    Your next appointment:   12 month(s)  The format for your next appointment:   Either In Person or Virtual  Provider:   You may see Olga Millers, MD or one of the following Advanced Practice Providers on your designated Care Team:    Corine Shelter, PA-C  [place], [place]  Edd Fabian, [place]    Other Instructions Please call our office 2 months in advance to schedule your annual appointment.

## 2019-05-01 NOTE — Progress Notes (Signed)
Virtual Visit via Telephone Note   This visit type was conducted due to national recommendations for restrictions regarding the COVID-19 Pandemic (e.g. social distancing) in an effort to limit this patient's exposure and mitigate transmission in our community.  Due to her co-morbid illnesses, this patient is at least at moderate risk for complications without adequate follow up.  This format is felt to be most appropriate for this patient at this time.  The patient did not have access to video technology/had technical difficulties with video requiring transitioning to audio format only (telephone).  All issues noted in this document were discussed and addressed.  No physical exam could be performed with this format.  Please refer to the patient's chart for her  consent to telehealth for [facility].   The patient was identified using 2 identifiers.  Date:  05/01/2019   ID:  Megan Atkinson, DOB September 20, 1950, MRN [id number]  Patient Location: Home Provider Location: Home  PCP:  No primary care provider on file.  Cardiologist:  Dr Jens Som Electrophysiologist:  None   Evaluation Performed:  Follow-Up Visit  Chief Complaint:  none  History of Present Illness:    Megan Atkinson is a 69 y.o. female with a history of coronary disease status post CABG in 2012.  Other medical issues include hypertension and non-insulin-dependent diabetes.  She is morbidly obese with a BMI of 55.  We saw her last in June 2019 when she was admitted with respiratory failure.   She lives in her own home with help from family members and a nurses aide.  She is pretty much bedbound, she tells me they have to get her out of bed with a Hoyer lift.  She has a primary care provider that checks on her seizure every 3 months, his records are not in epic.  From a cardiac standpoint the patient tells me she is done well.  She is supposed to be on CPAP at night but says she cannot wear it because she needs help getting it on  and there is no one to help her at night.  She is on oxygen.  She is having no issues with her medications and denies having any chest pain.  The patient does not have symptoms concerning for COVID-19 infection (fever, chills, cough, or new shortness of breath).    Past Medical History:  Diagnosis Date   CAD (coronary artery disease)    small NSTEMI 11/12: LHC 01/06/11:  LAD 90-95%, mD1 90%, pD2 50%,  pOM1 50%, RCA occluded,  EF > 65%;  CABG  12/12: L-LAD   Diabetes mellitus    Glucose intolerance (impaired glucose tolerance)    Hemoglobin A1c 6.4 in 12/2010   HTN (hypertension)    Echo 01/06/11: EF 65%   Hyperlipidemia    Obesity    Stroke (HCC) 03/06/2016   Past Surgical History:  Procedure Laterality Date   ABDOMINAL HYSTERECTOMY     APPENDECTOMY     CORONARY ARTERY BYPASS GRAFT  01/12/2011   Procedure: CORONARY ARTERY BYPASS GRAFTING (CABG);  Surgeon: Alleen Borne, MD;  Location: [facility] OR;  Service: Open Heart Surgery;  Laterality: N/A;  coronary artery bypass graft times one using left internal mammary artery . Attempted endoscopic saphenous vein harvest   LEFT HEART CATHETERIZATION WITH CORONARY ANGIOGRAM N/A 01/06/2011   Procedure: LEFT HEART CATHETERIZATION WITH CORONARY ANGIOGRAM;  Surgeon: Rollene Rotunda, MD;  Location: [facility] CATH LAB;  Service: Cardiovascular;  Laterality: N/A;     Current Meds  Medication  Sig   acetaminophen (TYLENOL) 325 MG tablet Take 650 mg by mouth every 6 (six) hours as needed.   Amino Acids-Protein Hydrolys (FEEDING SUPPLEMENT, PRO-STAT SUGAR FREE 64,) LIQD Take 30 mLs by mouth 3 (three) times daily with meals.   atorvastatin (LIPITOR) 80 MG tablet TAKE 1 TABLET BY MOUTH ONCE DAILY AT  6  PM   clopidogrel (PLAVIX) 75 MG tablet Take 1 tablet (75 mg total) by mouth daily.   collagenase (SANTYL) ointment Apply topically daily.   fluticasone (FLONASE) 50 MCG/ACT nasal spray Place 1 spray into both nostrils daily. (Patient taking  differently: Place 1 spray into both nostrils daily as needed for allergies. )   furosemide (LASIX) 20 MG tablet Take 1 tablet (20 mg total) by mouth daily.   gabapentin (NEURONTIN) 100 MG capsule Take 100 mg by mouth 2 (two) times daily.   metoprolol tartrate (LOPRESSOR) 25 MG tablet Take 12.5 mg by mouth 2 (two) times daily.   miconazole (MICOTIN) 2 % powder Apply topically 2 times daily at 12 noon and 4 pm.   polyvinyl alcohol (LIQUIFILM TEARS) 1.4 % ophthalmic solution Place 2 drops into both eyes as needed for dry eyes.   protein supplement shake (PREMIER PROTEIN) LIQD Take 325 mLs (11 oz total) by mouth 2 (two) times daily between meals.   pyridoxine (B-6) 100 MG tablet Take 100 mg by mouth daily.   senna-docusate (SENOKOT-S) 8.6-50 MG tablet Take 2 tablets by mouth 2 (two) times daily. (Patient taking differently: Take 1 tablet by mouth at bedtime as needed for mild constipation or moderate constipation. )   sitaGLIPtin (JANUVIA) 50 MG tablet Take 1 tablet (50 mg total) by mouth daily.     Allergies:   Lisinopril, Metformin, and Metformin and related   Social History   Tobacco Use   Smoking status: Never Smoker   Smokeless tobacco: Never Used  Substance Use Topics   Alcohol use: Yes    Comment: Occasional   Drug use: No     Family Hx: The patient's family history includes Coronary artery disease in her unknown relative; Heart disease in her father, maternal grandmother, and mother.  ROS:   Please see the history of present illness.    All other systems reviewed and are negative.   Prior CV studies:   The following studies were reviewed today: Echo 07/30/2017   Labs/Other Tests and Data Reviewed:    EKG:  An ECG dated 07/30/2017 was personally reviewed today and demonstrated:  NSR, LVH, inferior and anterior Qs  Recent Labs: No results found for requested labs within last 8760 hours.   Recent Lipid Panel Lab Results  Component Value Date/Time   CHOL  143 02/14/2017 11:35 AM   TRIG 90 02/14/2017 11:35 AM   HDL 47 02/14/2017 11:35 AM   CHOLHDL 3.0 02/14/2017 11:35 AM   CHOLHDL 4.2 03/07/2016 03:07 AM   LDLCALC 78 02/14/2017 11:35 AM   LDLDIRECT 148.7 06/01/2011 09:42 AM    Wt Readings from Last 3 Encounters:  07/29/17 (!) 353 lb 2.8 oz (160.2 kg)  07/07/17 (!) 440 lb (199.6 kg)  05/13/17 (!) 372 lb (168.7 kg)     Objective:    Vital Signs:  Ht 5\' 7"  (1.702 m)    BMI 55.32 kg/m    VITAL SIGNS:  reviewed  ASSESSMENT & PLAN:    CAD- H/O CABG in 2012  Morbid obesity- Last BMI was 55.  She is bed bound at home, get ups with a Harrel Lemon  lift and assistance  Chronic respiratory failure- Suspect this primarily hypoventilation secondary to morbid obesity. Echo in June 2019 showed preserved LVF with moderately dilated RV  NIDDM- On januvia   Plan:  F/u one year COVID-19 Education: The signs and symptoms of COVID-19 were discussed with the patient and how to seek care for testing (follow up with PCP or arrange E-visit).  The importance of social distancing was discussed today.  Time:   Today, I have spent 15 minutes with the patient with telehealth technology discussing the above problems.     Medication Adjustments/Labs and Tests Ordered: Current medicines are reviewed at length with the patient today.  Concerns regarding medicines are outlined above.   Tests Ordered: No orders of the defined types were placed in this encounter.   Medication Changes: No orders of the defined types were placed in this encounter.   Follow Up:  Either In Person or Virtual one year with Dr Jens Som  Signed, Corine Shelter, PA-C  05/01/2019 9:21 AM    [facility]

## 2019-06-02 ENCOUNTER — Emergency Department (HOSPITAL_COMMUNITY)
Admission: EM | Admit: 2019-06-02 | Discharge: 2019-06-03 | Disposition: A | Discharge status: Home / Self Care | Payer: Medicare Other | Attending: Emergency Medicine | Admitting: Emergency Medicine

## 2019-06-02 ENCOUNTER — Emergency Department (HOSPITAL_COMMUNITY): Payer: Medicare Other

## 2019-06-02 DIAGNOSIS — I10 Essential (primary) hypertension: Secondary | ICD-10-CM | POA: Diagnosis not present

## 2019-06-02 DIAGNOSIS — Z7901 Long term (current) use of anticoagulants: Secondary | ICD-10-CM | POA: Diagnosis not present

## 2019-06-02 DIAGNOSIS — N3001 Acute cystitis with hematuria: Secondary | ICD-10-CM | POA: Diagnosis not present

## 2019-06-02 DIAGNOSIS — Z951 Presence of aortocoronary bypass graft: Secondary | ICD-10-CM | POA: Insufficient documentation

## 2019-06-02 DIAGNOSIS — I2510 Atherosclerotic heart disease of native coronary artery without angina pectoris: Secondary | ICD-10-CM | POA: Insufficient documentation

## 2019-06-02 DIAGNOSIS — Z79899 Other long term (current) drug therapy: Secondary | ICD-10-CM | POA: Insufficient documentation

## 2019-06-02 DIAGNOSIS — Z8673 Personal history of transient ischemic attack (TIA), and cerebral infarction without residual deficits: Secondary | ICD-10-CM | POA: Diagnosis not present

## 2019-06-02 DIAGNOSIS — Z7984 Long term (current) use of oral hypoglycemic drugs: Secondary | ICD-10-CM | POA: Insufficient documentation

## 2019-06-02 DIAGNOSIS — E119 Type 2 diabetes mellitus without complications: Secondary | ICD-10-CM | POA: Insufficient documentation

## 2019-06-02 DIAGNOSIS — R4781 Slurred speech: Secondary | ICD-10-CM | POA: Diagnosis present

## 2019-06-02 LAB — URINALYSIS, ROUTINE W REFLEX MICROSCOPIC
Bilirubin Urine: NEGATIVE
Glucose, UA: NEGATIVE mg/dL
Ketones, ur: NEGATIVE mg/dL
Nitrite: NEGATIVE
Protein, ur: 100 mg/dL — AB
Specific Gravity, Urine: 1.017 (ref 1.005–1.030)
WBC, UA: >50 WBC/hpf — ABNORMAL HIGH (ref 0–5)
pH: 5 (ref 5.0–8.0)

## 2019-06-02 LAB — COMPREHENSIVE METABOLIC PANEL
ALT: 12 U/L (ref 0–44)
AST: 18 U/L (ref 15–41)
Albumin: 3.2 g/dL — ABNORMAL LOW (ref 3.5–5.0)
Alkaline Phosphatase: 70 U/L (ref 38–126)
Anion gap: 11 (ref 5–15)
BUN: 9 mg/dL (ref 8–23)
CO2: 34 mmol/L — ABNORMAL HIGH (ref 22–32)
Calcium: 9.3 mg/dL (ref 8.9–10.3)
Chloride: 100 mmol/L (ref 98–111)
Creatinine, Ser: 0.89 mg/dL (ref 0.44–1.00)
GFR calc Af Amer: >60 mL/min (ref >60)
GFR calc non Af Amer: >60 mL/min (ref >60)
Glucose, Bld: 115 mg/dL — ABNORMAL HIGH (ref 70–99)
Potassium: 3.5 mmol/L (ref 3.5–5.1)
Sodium: 145 mmol/L (ref 135–145)
Total Bilirubin: 0.6 mg/dL (ref 0.3–1.2)
Total Protein: 7 g/dL (ref 6.5–8.1)

## 2019-06-02 LAB — CBC WITH DIFFERENTIAL/PLATELET
Abs Immature Granulocytes: 0 10*3/uL (ref 0.00–0.07)
Basophils Absolute: 0.1 10*3/uL (ref 0.0–0.1)
Basophils Relative: 1 %
Eosinophils Absolute: 0.1 10*3/uL (ref 0.0–0.5)
Eosinophils Relative: 1 %
HCT: 49.4 % — ABNORMAL HIGH (ref 36.0–46.0)
Hemoglobin: 13.9 g/dL (ref 12.0–15.0)
Lymphocytes Relative: 17 %
Lymphs Abs: 1.7 10*3/uL (ref 0.7–4.0)
MCH: 31.8 pg (ref 26.0–34.0)
MCHC: 28.1 g/dL — ABNORMAL LOW (ref 30.0–36.0)
MCV: 113 fL — ABNORMAL HIGH (ref 80.0–100.0)
Monocytes Absolute: 1.2 10*3/uL — ABNORMAL HIGH (ref 0.1–1.0)
Monocytes Relative: 12 %
Neutro Abs: 6.9 10*3/uL (ref 1.7–7.7)
Neutrophils Relative %: 69 %
Platelets: 280 10*3/uL (ref 150–400)
RBC: 4.37 MIL/uL (ref 3.87–5.11)
RDW: 15.7 % — ABNORMAL HIGH (ref 11.5–15.5)
WBC: 10 10*3/uL (ref 4.0–10.5)
nRBC: 0 % (ref 0.0–0.2)
nRBC: 0 /100 WBC

## 2019-06-02 LAB — CBG MONITORING, ED: Glucose-Capillary: 95 mg/dL (ref 70–99)

## 2019-06-02 LAB — AMMONIA: Ammonia: 35 umol/L (ref 9–35)

## 2019-06-02 LAB — ETHANOL: Alcohol, Ethyl (B): <10 mg/dL (ref <10)

## 2019-06-02 MED ORDER — CEPHALEXIN 500 MG PO CAPS
500.0000 mg | ORAL_CAPSULE | Freq: Four times a day (QID) | ORAL | 0 refills | Status: DC
Start: 2019-06-02 — End: 2021-11-14

## 2019-06-02 MED ORDER — CEPHALEXIN 250 MG PO CAPS
1000.0000 mg | ORAL_CAPSULE | Freq: Once | ORAL | Status: AC
  Administered 2019-06-02: 1000 mg via ORAL
  Filled 2019-06-02: qty 4

## 2019-06-02 MED ORDER — SODIUM CHLORIDE 0.9 % IV BOLUS
1000.0000 mL | Freq: Once | INTRAVENOUS | Status: AC
  Administered 2019-06-02: 1000 mL via INTRAVENOUS

## 2019-06-02 MED ORDER — SODIUM CHLORIDE 0.9 % IV BOLUS
1000.0000 mL | Freq: Once | INTRAVENOUS | Status: AC
  Administered 2019-06-02: 17:00:00 1000 mL via INTRAVENOUS

## 2019-06-02 NOTE — ED Notes (Signed)
Attempted again to reach spouse, unsuccessful. Pt would like "Tee" called but is unable to provide number

## 2019-06-02 NOTE — ED Notes (Signed)
Called son to facilitate discharge home, son states that there is no caregiver at home at this time, that pt is actually still living at home with home health and hoyer/purewick at the home. Pt apparently refuses skilled nursing care. Son states that someone at hospital told him pt would be staying overnight, so arrangements were not made, unsure who this was.  Spouse unable to come to home to let EMS inside, pt is bedbound. Attempted to reach spouse at numbers provided by son ([phone], [phone]), unable to reach. Left message on machine.

## 2019-06-02 NOTE — ED Provider Notes (Signed)
[facility] EMERGENCY DEPARTMENT Provider Note   CSN: [id number] Arrival date & time: 06/02/19  1524     History Chief Complaint  Patient presents with  . Aphasia    Megan Atkinson is a 69 y.o. female.  69 yo F with slurred speech and generalized fatigue.  This been going on for about 3 days now.  They tried to send her from the nursing home yesterday but it was refused by the patient.  Patient denies any focal symptoms denies cough congestion or fever denies chest pain or shortness of breath denies abdominal pain vomiting or diarrhea.  She does endorse some decreased oral intake states that she has been drinking but not really eating.  She denies one-sided numbness or weakness denies difficulty swallowing.  She has had some difficulty with her speech.  Feels more slurred.  The history is provided by the patient.  Illness Severity:  Moderate Onset quality:  Gradual Duration:  3 days Timing:  Constant Progression:  Worsening Chronicity:  New Associated symptoms: fatigue   Associated symptoms: no chest pain, no congestion, no fever, no headaches, no myalgias, no nausea, no rhinorrhea, no shortness of breath, no vomiting and no wheezing        Past Medical History:  Diagnosis Date  . CAD (coronary artery disease)    small NSTEMI 11/12: LHC 01/06/11:  LAD 90-95%, mD1 90%, pD2 50%,  pOM1 50%, RCA occluded,  EF > 65%;  CABG  12/12: L-LAD  . Diabetes mellitus   . Glucose intolerance (impaired glucose tolerance)    Hemoglobin A1c 6.4 in 12/2010  . HTN (hypertension)    Echo 01/06/11: EF 65%  . Hyperlipidemia   . Obesity   . Stroke [facility]) 03/06/2016    Patient Active Problem List   Diagnosis Date Noted  . Hx of CABG 05/01/2019  . Chronic respiratory failure (HCC) 05/01/2019  . Renal insufficiency 07/29/2017  . Acute respiratory failure (HCC) 07/29/2017  . Pancreatic mass- needs MRI 07/08/2017  . Emphysematous pyelonephritis of left kidney 07/06/2017  .  Acute diastolic heart failure (HCC)   . Acute hypoxemic respiratory failure (HCC) 07/05/2017  . Functional urinary incontinence 06/28/2017  . Mycotic toenails 06/28/2017  . Intertrigo 06/28/2017  . Bilateral leg edema 06/28/2017  . Cerebral thrombosis with cerebral infarction 05/16/2017  . SIRS (systemic inflammatory response syndrome) (HCC) 05/13/2017  . Acute lower UTI 05/13/2017  . Acute kidney injury (HCC) 05/12/2017  . Elevated troponin 05/12/2017  . Dehydration 05/12/2017  . Hypernatremia 05/12/2017  . Elevated CK 05/12/2017  . Prolonged QT interval 05/12/2017  . Leukocytosis 05/12/2017  . Acute renal failure (ARF) (HCC) 05/12/2017  . Acute blood loss anemia   . Elevated serum creatinine   . Slow transit constipation   . Benign essential HTN   . Generalized anxiety disorder   . Gait disturbance, post-stroke 03/13/2016  . Cerebellar stroke, acute (HCC) 03/10/2016  . Super obese   . Prediabetes   . Ataxia, post-stroke   . Acute cystitis without hematuria   . CVA (cerebral vascular accident) (HCC) 03/06/2016  . Morbid obesity (HCC) 02/29/2016  . OSA (obstructive sleep apnea) 11/24/2014  . Dyspnea 10/02/2014  . Anemia 04/05/2011  . Urinary tract infection, site not specified 04/05/2011  . Non-insulin dependent type 2 diabetes mellitus (HCC) 02/22/2011  . Hyperlipidemia 02/22/2011  . CAD (coronary artery disease) 01/07/2011  . Hypertension 01/07/2011    Past Surgical History:  Procedure Laterality Date  . ABDOMINAL HYSTERECTOMY    .  APPENDECTOMY    . CORONARY ARTERY BYPASS GRAFT  01/12/2011   Procedure: CORONARY ARTERY BYPASS GRAFTING (CABG);  Surgeon: Alleen Borne, MD;  [facility] OR;  Service: Open Heart Surgery;  Laterality: N/A;  coronary artery bypass graft times one using left internal mammary artery . Attempted endoscopic saphenous vein harvest  . LEFT HEART CATHETERIZATION WITH CORONARY ANGIOGRAM N/A 01/06/2011   Procedure: LEFT HEART CATHETERIZATION WITH  CORONARY ANGIOGRAM;  Surgeon: Rollene Rotunda, MD;  Location: [facility] CATH LAB;  Service: Cardiovascular;  Laterality: N/A;     OB History   No obstetric history on file.     Family History  Problem Relation Age of Onset  . Coronary artery disease Unknown        No family history  . Heart disease Mother   . Heart disease Father   . Heart disease Maternal Grandmother     Social History   Tobacco Use  . Smoking status: Never Smoker  . Smokeless tobacco: Never Used  Substance Use Topics  . Alcohol use: Yes    Comment: Occasional  . Drug use: No    Home Medications Prior to Admission medications   Medication Sig Start Date End Date Taking? Authorizing Provider  acetaminophen (TYLENOL) 325 MG tablet Take 650 mg by mouth every 6 (six) hours as needed.    [provider]  Amino Acids-Protein Hydrolys (FEEDING SUPPLEMENT, PRO-STAT SUGAR FREE 64,) LIQD Take 30 mLs by mouth 3 (three) times daily with meals.    [provider]  atorvastatin (LIPITOR) 80 MG tablet TAKE 1 TABLET BY MOUTH ONCE DAILY AT  6  PM 12/07/16   Lewayne Bunting, MD  cephALEXin (KEFLEX) 500 MG capsule Take 1 capsule (500 mg total) by mouth 4 (four) times daily. 06/02/19   Melene Plan, DO  clopidogrel (PLAVIX) 75 MG tablet Take 1 tablet (75 mg total) by mouth daily. 12/07/16   Lewayne Bunting, MD  collagenase (SANTYL) ointment Apply topically daily. 07/09/17   Calvert Cantor, MD  fluticasone (FLONASE) 50 MCG/ACT nasal spray Place 1 spray into both nostrils daily. Patient taking differently: Place 1 spray into both nostrils daily as needed for allergies.  04/01/16   Love, Evlyn Kanner, PA-C  furosemide (LASIX) 20 MG tablet Take 1 tablet (20 mg total) by mouth daily. 01/16/18   Nche, Bonna Gains, NP  gabapentin (NEURONTIN) 100 MG capsule Take 100 mg by mouth 2 (two) times daily.    [provider]  metoprolol tartrate (LOPRESSOR) 25 MG tablet Take 12.5 mg by mouth 2 (two) times daily.    [provider]  miconazole (MICOTIN) 2 % powder Apply topically 2 times daily at 12 noon and 4 pm. 06/28/17   Nche, Bonna Gains, NP  polyvinyl alcohol (LIQUIFILM TEARS) 1.4 % ophthalmic solution Place 2 drops into both eyes as needed for dry eyes. 07/08/17   Calvert Cantor, MD  protein supplement shake (PREMIER PROTEIN) LIQD Take 325 mLs (11 oz total) by mouth 2 (two) times daily between meals. 07/08/17   Calvert Cantor, MD  pyridoxine (B-6) 100 MG tablet Take 100 mg by mouth daily.    [provider]  senna-docusate (SENOKOT-S) 8.6-50 MG tablet Take 2 tablets by mouth 2 (two) times daily. Patient taking differently: Take 1 tablet by mouth at bedtime as needed for mild constipation or moderate constipation.  03/31/16   Love, Evlyn Kanner, PA-C  sitaGLIPtin (JANUVIA) 50 MG tablet Take 1 tablet (50 mg total) by mouth daily. 07/08/17  Calvert Cantorizwan, Saima, MD    Allergies    Lisinopril, Metformin, and Metformin and related  Review of Systems   Review of Systems  Constitutional: Positive for fatigue. Negative for chills and fever.  HENT: Negative for congestion and rhinorrhea.   Eyes: Negative for redness and visual disturbance.  Respiratory: Negative for shortness of breath and wheezing.   Cardiovascular: Negative for chest pain and palpitations.  Gastrointestinal: Negative for nausea and vomiting.  Genitourinary: Negative for dysuria and urgency.  Musculoskeletal: Negative for arthralgias and myalgias.  Skin: Negative for pallor and wound.  Neurological: Negative for dizziness and headaches.    Physical Exam Updated Vital Signs BP (!) 126/48   Pulse 100   Temp 100.1 F (37.8 C) (Rectal)   Resp 17   SpO2 97%   Physical Exam Vitals and nursing note reviewed.  Constitutional:      General: She is not in acute distress.    Appearance: She is well-developed. She is obese. She is not diaphoretic.     Comments: Chronically ill-appearing  HENT:     Head: Normocephalic and atraumatic.  Eyes:       Pupils: Pupils are equal, round, and reactive to light.  Cardiovascular:     Rate and Rhythm: Normal rate and regular rhythm.     Heart sounds: No murmur. No friction rub. No gallop.   Pulmonary:     Effort: Pulmonary effort is normal.     Breath sounds: No wheezing or rales.  Abdominal:     General: There is no distension.     Palpations: Abdomen is soft.     Tenderness: There is no abdominal tenderness.  Genitourinary:    Comments: Dime size sacral ulcer. Musculoskeletal:        General: No tenderness.     Cervical back: Normal range of motion and neck supple.  Skin:    General: Skin is warm and dry.  Neurological:     Mental Status: She is alert and oriented to person, place, and time.     Comments: Slurred speech limits some communication.  Globally fatigued on exam.  Initially would not track to the left but would when I continue to direct her.  Plantarflex feet bilaterally, contractured  Psychiatric:        Behavior: Behavior normal.     ED Results / Procedures / Treatments   Labs (all labs ordered are listed, but only abnormal results are displayed) Labs Reviewed  COMPREHENSIVE METABOLIC PANEL - Abnormal; Notable for the following components:      Result Value   CO2 34 (*)    Glucose, Bld 115 (*)    Albumin 3.2 (*)    All other components within normal limits  CBC WITH DIFFERENTIAL/PLATELET - Abnormal; Notable for the following components:   HCT 49.4 (*)    MCV 113.0 (*)    MCHC 28.1 (*)    RDW 15.7 (*)    Monocytes Absolute 1.2 (*)    All other components within normal limits  URINALYSIS, ROUTINE W REFLEX MICROSCOPIC - Abnormal; Notable for the following components:   Color, Urine AMBER (*)    APPearance CLOUDY (*)    Hgb urine dipstick SMALL (*)    Protein, ur 100 (*)    Leukocytes,Ua LARGE (*)    WBC, UA >50 (*)    Bacteria, UA MANY (*)    All other components within normal limits  URINE CULTURE  AMMONIA  ETHANOL  CBG MONITORING, ED    EKG  EKG  Interpretation  Date/Time:  Monday June 02 2019 16:34:00 EDT Ventricular Rate:  103 PR Interval:    QRS Duration: 94 QT Interval:  331 QTC Calculation: 434 R Axis:   -61 Text Interpretation: Sinus tachycardia Inferior infarct, old Anterolateral infarct, age indeterminate No significant change since last tracing Confirmed by Deno Etienne [phone]) on 06/02/2019 5:03:28 PM   Radiology DG Chest 2 View  Result Date: 06/02/2019 CLINICAL DATA:  ALOC EXAM: CHEST - 2 VIEW COMPARISON:  07/30/2017 FINDINGS: Post sternotomy changes. Low lung volumes with chronic elevation of right diaphragm. No pleural effusion. Patchy atelectasis at the bases. Bilateral hilar fullness probably due to vascular crowding from low lung volume and elevated right diaphragm. The heart appears slightly enlarged. Aortic atherosclerosis. No pneumothorax. IMPRESSION: 1. Low lung volumes with chronic elevation of right diaphragm. Patchy atelectasis at both bases 2. Enlarged cardiomediastinal silhouette. Bilateral right greater than left hilar fullness, probably due to vascular crowding from lung volumes and elevated right diaphragm though cannot exclude right perihilar airspace disease. Electronically Signed   By: Donavan Foil M.D.   On: 06/02/2019 16:26   CT HEAD WO CONTRAST  Result Date: 06/02/2019 CLINICAL DATA:  Aphasia EXAM: CT HEAD WITHOUT CONTRAST TECHNIQUE: Contiguous axial images were obtained from the base of the skull through the vertex without intravenous contrast. COMPARISON:  07/29/2017 FINDINGS: Brain: Changes consistent with prior left cerebellar infarct are noted. Mild chronic white matter ischemic changes noted. No findings to suggest acute hemorrhage, acute infarction or space-occupying mass lesion are noted. Vascular: No hyperdense vessel or unexpected calcification. Skull: Normal. Negative for fracture or focal lesion. Sinuses/Orbits: No acute finding. Other: None. IMPRESSION: Chronic ischemic changes similar to that  seen on the prior exam. No acute abnormality is noted. Electronically Signed   By: Inez Catalina M.D.   On: 06/02/2019 16:17    Procedures Procedures (including critical care time)  Medications Ordered in ED Medications  sodium chloride 0.9 % bolus 1,000 mL (0 mLs Intravenous Stopped 06/02/19 1736)  sodium chloride 0.9 % bolus 1,000 mL (1,000 mLs Intravenous New Bag/Given 06/02/19 1958)    ED Course  I have reviewed the triage vital signs and the nursing notes.  Pertinent labs & imaging results that were available during my care of the patient were reviewed by me and considered in my medical decision making (see chart for details).    MDM Rules/Calculators/A&P                      69 yo F with a chief complaint of slurred speech and general fatigue.  Going on for about 72 hours.  No focal finding on exam.  Patient appears to be chronically debilitated with contractures to her lower extremities.  Will obtain an altered mental status work-up.  Give a bolus of IV fluids.  Reassess.  Patient seems somewhat more with it on most recent reassessment.  Her urine is consistent with a urinary tract infection and appeared purulent per the nursing staff.  I discussed this with the patient and offered admission.  She repeated back understanding and was electing to go back to her facility.  Will prescribe her antibiotics.  Return precautions given.  9:22 PM:  I have discussed the diagnosis/risks/treatment options with the patient and believe the pt to be eligible for discharge home to follow-up with PCP. We also discussed returning to the ED immediately if new or worsening sx occur. We discussed the sx which are most concerning (e.g.,  sudden worsening pain, fever, inability to tolerate by mouth) that necessitate immediate return. Medications administered to the patient during their visit and any new prescriptions provided to the patient are listed below.  Medications given during this visit Medications    sodium chloride 0.9 % bolus 1,000 mL (0 mLs Intravenous Stopped 06/02/19 1736)  sodium chloride 0.9 % bolus 1,000 mL (1,000 mLs Intravenous New Bag/Given 06/02/19 1958)     The patient appears reasonably screen and/or stabilized for discharge and I doubt any other medical condition or other [facility] requiring further screening, evaluation, or treatment in the ED at this time prior to discharge.   Final Clinical Impression(s) / ED Diagnoses Final diagnoses:  Acute cystitis with hematuria    Rx / DC Orders ED Discharge Orders         Ordered    cephALEXin (KEFLEX) 500 MG capsule  4 times daily     06/02/19 2120           Melene Plan, DO 06/02/19 2122

## 2019-06-02 NOTE — ED Triage Notes (Signed)
Pt presents from skilled nursing for "altered" ("not as vibrant as she usually is") and slurred speech x3 days, refused EMS transport yesterday. 3L [place] at baseline  H/o stroke in 2019, L side def  EMS exam- 125CBG, 100P, 154/84BP, 20RR, 99% on 3L [place]

## 2019-06-02 NOTE — Discharge Instructions (Signed)
I offered you to come to the hospital and you chose to go back to your facility.  Please take the antibiotics as prescribed.  Please return for any worsening.

## 2019-06-03 LAB — URINE CULTURE

## 2019-06-03 MED ORDER — DIPHENHYDRAMINE HCL 25 MG PO CAPS: 50.0000 mg | ORAL_CAPSULE | Freq: Once | ORAL | Status: DC

## 2019-06-03 NOTE — ED Notes (Signed)
Patient given discharge instructions. Questions were answered. Patient's husband verbalized understanding of discharge instructions via phone and care at home.  Pt discharged with ptar.

## 2019-06-03 NOTE — ED Provider Notes (Signed)
Patient is awaiting transport home.  I spoke to patient and she continues to insist on going home.  She reports that she actually does already have home health in place and they are present every day.  Apparently she will be discharged in the morning with home health as they are able to let her into her house.  Patient otherwise stable, resting comfortably no acute distress.  She has been prescribed Keflex for her UTI   Zadie Rhine, MD 06/03/19 [phone]

## 2019-06-03 NOTE — ED Notes (Signed)
Spoke to patients husband Tiburcio Bash, patient is okay to be transported home by [facility] at this time.

## 2020-03-21 IMAGING — MR MR HEAD W/O CM
6 series · 44 of 48 positions shown · non-contrast
Comparison: CT 05/12/2017.  MRI 03/07/2016.

CLINICAL DATA: Acute presentation with weakness and altered mental
status.

EXAM:
MRI HEAD WITHOUT CONTRAST
TECHNIQUE: Multiplanar, multiecho pulse sequences of the brain and surrounding
structures were obtained without intravenous contrast.

[Series 3: DWI · axial · 3.0mm · 1.02mm/px · z∈[-97,+34]mm · 13 of 90 slices shown (1 of 2)]
[im 1/90]
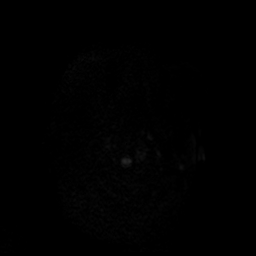
[im 8/90]
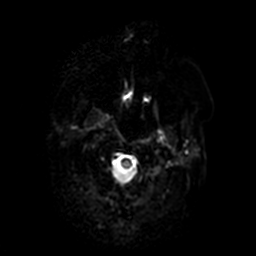
[im 15/90]
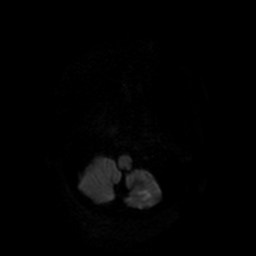
[im 23/90]
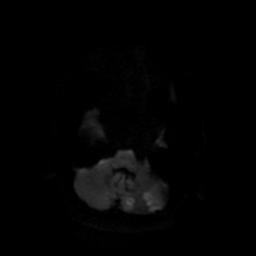
[im 30/90]
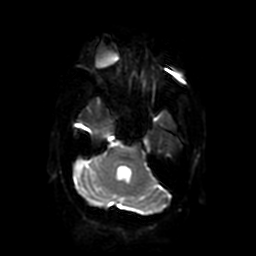
[im 38/90]
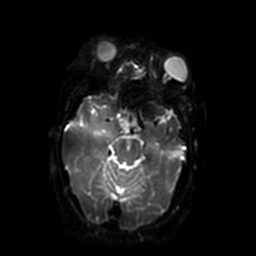
[im 45/90]
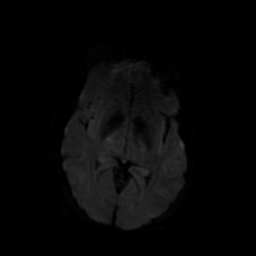
[im 52/90]
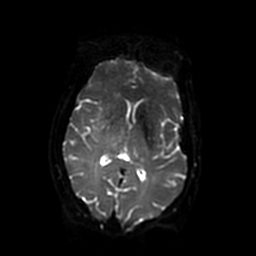
[im 60/90]
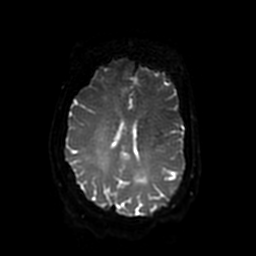
[im 67/90]
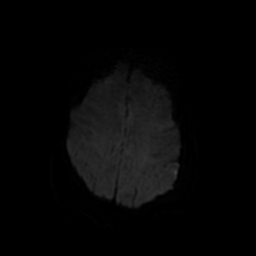
[im 75/90]
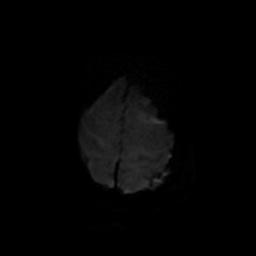
[im 82/90]
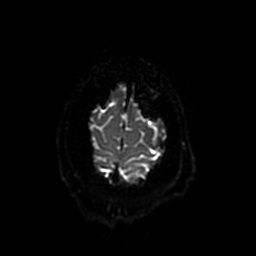
[im 90/90]
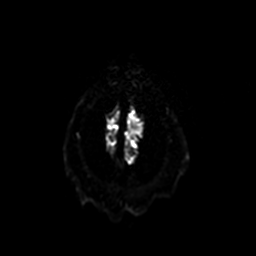

[Series 4: FLAIR · axial · 3.0mm · 0.94mm/px · z∈[-96,+35]mm · 3 of 23 slices shown]
[im 1/23]
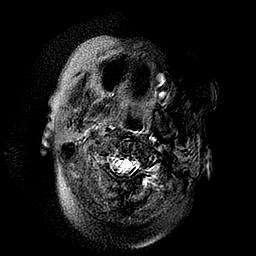
[im 12/23]
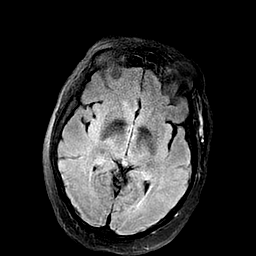
[im 23/23]
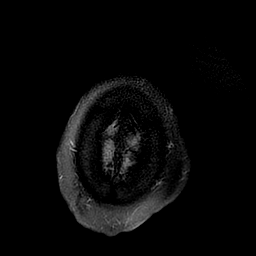

[Series 5: (person_name) · axial · 3.0mm · 0.47mm/px · z∈[-97,+38]mm · 8 of 92 slices shown]
[im 1/92]
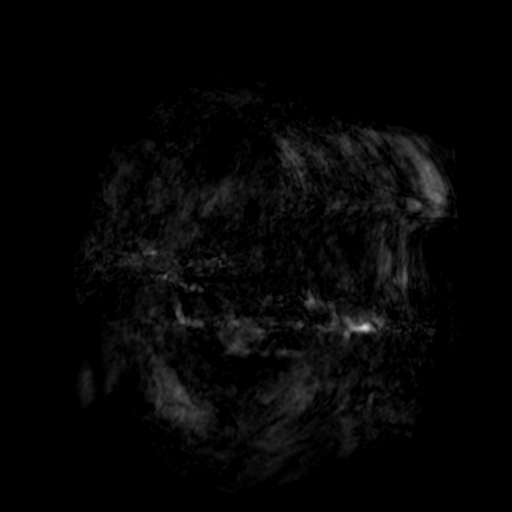
[im 17/92]
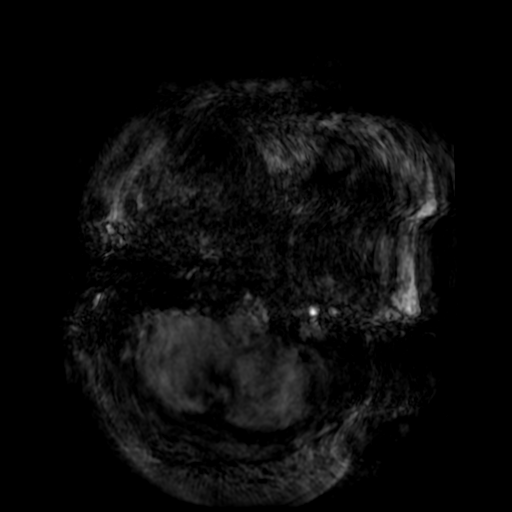
[im 25/92]
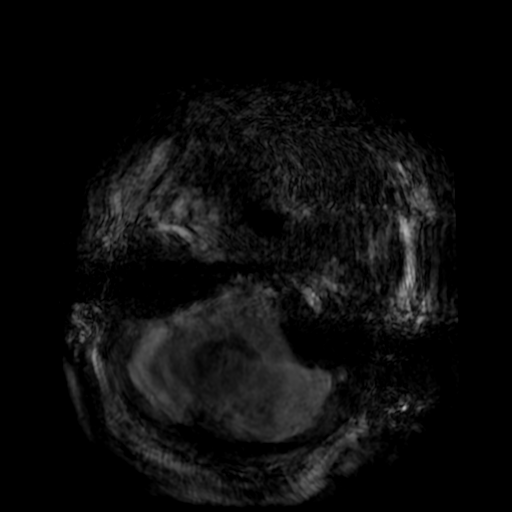
[im 42/92]
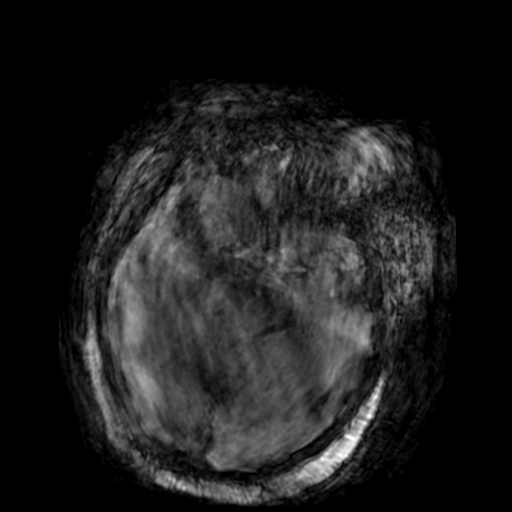
[im 50/92]
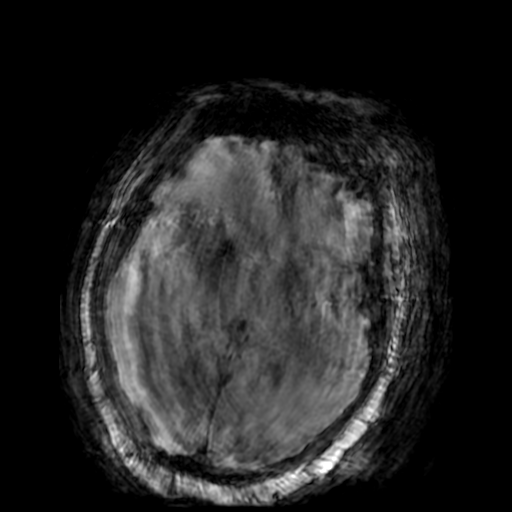
[im 67/92]
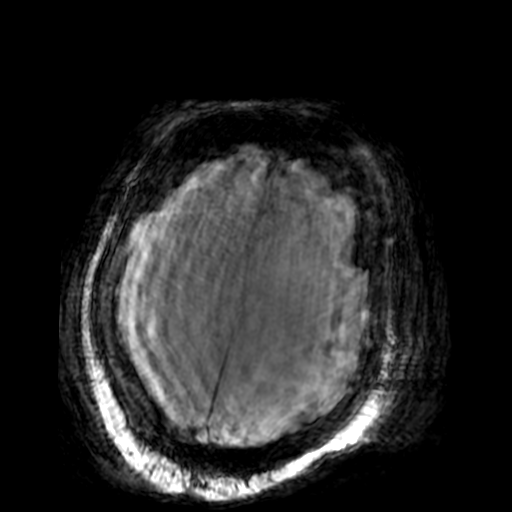
[im 75/92]
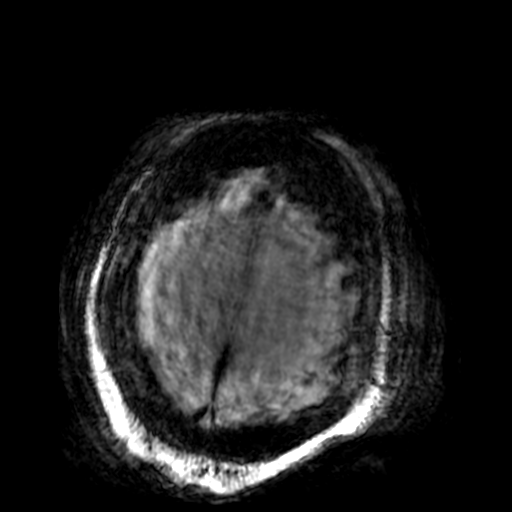
[im 92/92]
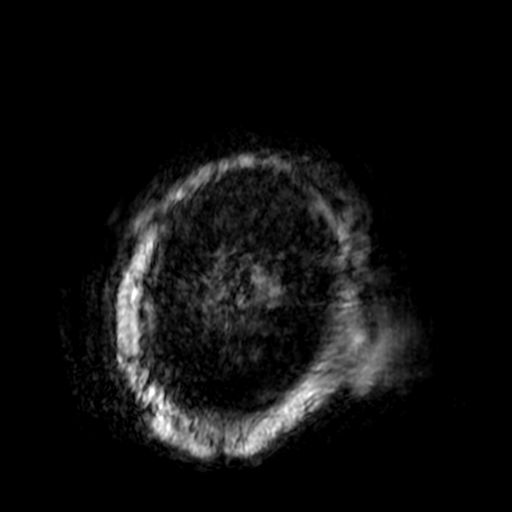

[Series 6: DWI · coronal · 5.0mm · 1.02mm/px · 9 of 70 slices shown (2 of 2)]
[im 1/70]
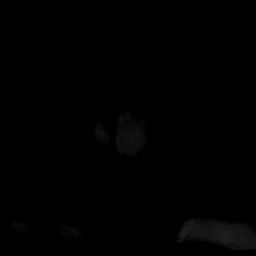
[im 9/70]
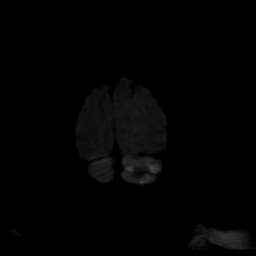
[im 18/70]
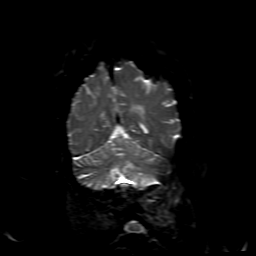
[im 26/70]
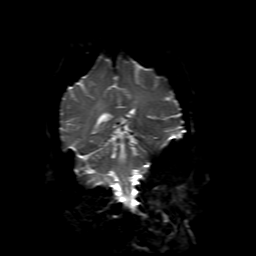
[im 35/70]
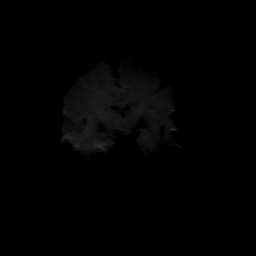
[im 44/70]
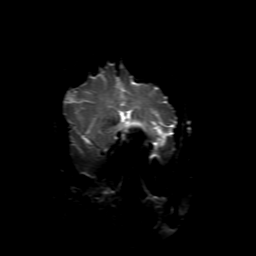
[im 52/70]
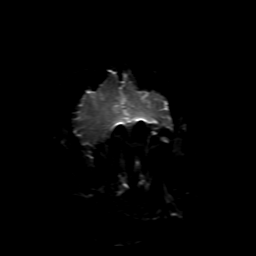
[im 61/70]
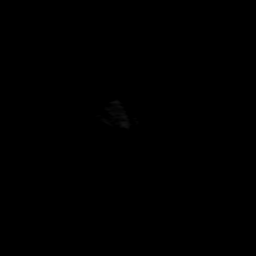
[im 70/70]
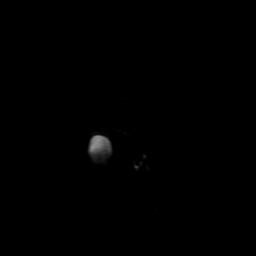

[Series 350: ADC · axial · 3.0mm · 1.02mm/px · z∈[-97,+34]mm · 6 of 45 slices shown (1 of 2)]
[im 1/45]
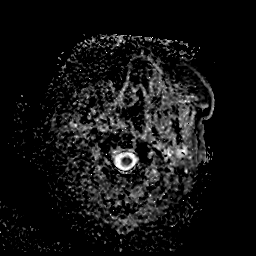
[im 9/45]
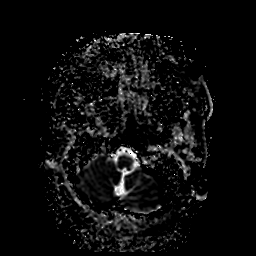
[im 18/45]
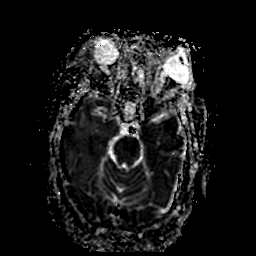
[im 27/45]
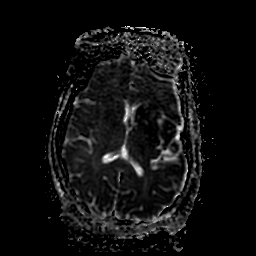
[im 36/45]
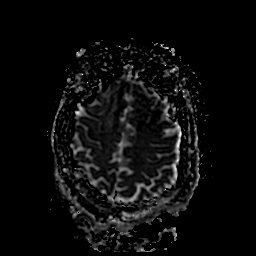
[im 45/45]
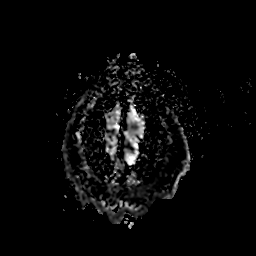

[Series 650: ADC · coronal · 5.0mm · 1.02mm/px · 5 of 35 slices shown (2 of 2)]
[im 1/35]
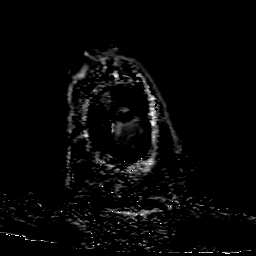
[im 9/35]
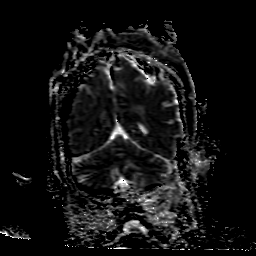
[im 18/35]
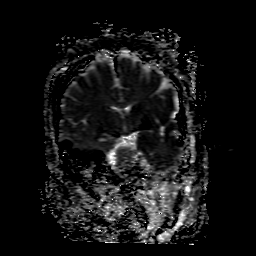
[im 26/35]
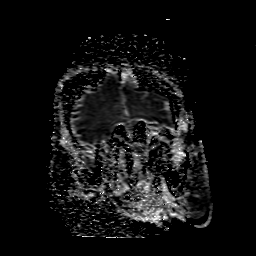
[im 35/35]
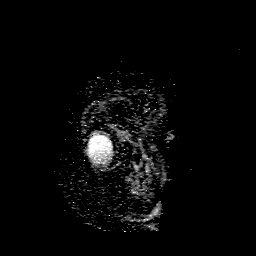

[44 of 48 positions shown; findings below may reference images not displayed]

FINDINGS: Brain: There is been extension of acute infarction in the left
cerebellum, affecting cerebellar tissue adjacent to the areas of
previously seen infarction. No supratentorial acute infarction.
There chronic small-vessel ischemic changes of the hemispheric white
matter. Study is abbreviated and motion degraded. No evidence of
mass lesion, hydrocephalus or extra-axial collection.

Vascular: No vascular information available.

Skull and upper cervical spine: No abnormality seen.

Sinuses/Orbits: Negative as seen.

Other: None
IMPRESSION: Abbreviated and motion degraded exam.

Areas of acute infarction within the left cerebellum, adjacent to
the areas of infarction that were acute in Sunday February, 2016. No
apparent mass effect or hemorrhage.

## 2021-11-13 ENCOUNTER — Emergency Department (HOSPITAL_COMMUNITY): Payer: Medicare Other

## 2021-11-13 ENCOUNTER — Other Ambulatory Visit: Payer: Self-pay

## 2021-11-13 ENCOUNTER — Encounter (HOSPITAL_COMMUNITY): Payer: Self-pay

## 2021-11-13 ENCOUNTER — Observation Stay (HOSPITAL_COMMUNITY)
Admission: EM | Admit: 2021-11-13 | Discharge: 2021-11-16 | Disposition: A | Discharge status: Skilled Nursing Facility | Payer: Medicare Other | Attending: Internal Medicine | Admitting: Internal Medicine

## 2021-11-13 DIAGNOSIS — Z79899 Other long term (current) drug therapy: Secondary | ICD-10-CM | POA: Insufficient documentation

## 2021-11-13 DIAGNOSIS — Z7902 Long term (current) use of antithrombotics/antiplatelets: Secondary | ICD-10-CM | POA: Insufficient documentation

## 2021-11-13 DIAGNOSIS — Z7984 Long term (current) use of oral hypoglycemic drugs: Secondary | ICD-10-CM | POA: Insufficient documentation

## 2021-11-13 DIAGNOSIS — J9621 Acute and chronic respiratory failure with hypoxia: Principal | ICD-10-CM | POA: Insufficient documentation

## 2021-11-13 DIAGNOSIS — R5381 Other malaise: Secondary | ICD-10-CM | POA: Diagnosis not present

## 2021-11-13 DIAGNOSIS — L89899 Pressure ulcer of other site, unspecified stage: Secondary | ICD-10-CM | POA: Insufficient documentation

## 2021-11-13 DIAGNOSIS — I110 Hypertensive heart disease with heart failure: Secondary | ICD-10-CM | POA: Diagnosis not present

## 2021-11-13 DIAGNOSIS — H109 Unspecified conjunctivitis: Secondary | ICD-10-CM | POA: Insufficient documentation

## 2021-11-13 DIAGNOSIS — R0902 Hypoxemia: Secondary | ICD-10-CM | POA: Diagnosis present

## 2021-11-13 DIAGNOSIS — R238 Other skin changes: Secondary | ICD-10-CM

## 2021-11-13 DIAGNOSIS — R8271 Bacteriuria: Secondary | ICD-10-CM | POA: Insufficient documentation

## 2021-11-13 DIAGNOSIS — I5030 Unspecified diastolic (congestive) heart failure: Secondary | ICD-10-CM | POA: Insufficient documentation

## 2021-11-13 DIAGNOSIS — Z23 Encounter for immunization: Secondary | ICD-10-CM | POA: Insufficient documentation

## 2021-11-13 DIAGNOSIS — I2510 Atherosclerotic heart disease of native coronary artery without angina pectoris: Secondary | ICD-10-CM | POA: Diagnosis not present

## 2021-11-13 DIAGNOSIS — E1140 Type 2 diabetes mellitus with diabetic neuropathy, unspecified: Secondary | ICD-10-CM | POA: Insufficient documentation

## 2021-11-13 DIAGNOSIS — R0602 Shortness of breath: Secondary | ICD-10-CM

## 2021-11-13 DIAGNOSIS — Z20822 Contact with and (suspected) exposure to covid-19: Secondary | ICD-10-CM | POA: Insufficient documentation

## 2021-11-13 DIAGNOSIS — J9601 Acute respiratory failure with hypoxia: Secondary | ICD-10-CM

## 2021-11-13 DIAGNOSIS — H1031 Unspecified acute conjunctivitis, right eye: Secondary | ICD-10-CM

## 2021-11-13 DIAGNOSIS — Z951 Presence of aortocoronary bypass graft: Secondary | ICD-10-CM | POA: Diagnosis not present

## 2021-11-13 DIAGNOSIS — I272 Pulmonary hypertension, unspecified: Secondary | ICD-10-CM | POA: Insufficient documentation

## 2021-11-13 DIAGNOSIS — L304 Erythema intertrigo: Secondary | ICD-10-CM | POA: Diagnosis not present

## 2021-11-13 DIAGNOSIS — Z8673 Personal history of transient ischemic attack (TIA), and cerebral infarction without residual deficits: Secondary | ICD-10-CM | POA: Diagnosis not present

## 2021-11-13 DIAGNOSIS — E876 Hypokalemia: Secondary | ICD-10-CM | POA: Insufficient documentation

## 2021-11-13 DIAGNOSIS — D7589 Other specified diseases of blood and blood-forming organs: Secondary | ICD-10-CM | POA: Diagnosis not present

## 2021-11-13 DIAGNOSIS — L8990 Pressure ulcer of unspecified site, unspecified stage: Secondary | ICD-10-CM | POA: Insufficient documentation

## 2021-11-13 LAB — CBC WITH DIFFERENTIAL/PLATELET
Abs Immature Granulocytes: 0.05 10*3/uL (ref 0.00–0.07)
Basophils Absolute: 0 10*3/uL (ref 0.0–0.1)
Basophils Relative: 0 %
Eosinophils Absolute: 0.2 10*3/uL (ref 0.0–0.5)
Eosinophils Relative: 2 %
HCT: 41.6 % (ref 36.0–46.0)
Hemoglobin: 11.9 g/dL — ABNORMAL LOW (ref 12.0–15.0)
Immature Granulocytes: 1 %
Lymphocytes Relative: 13 %
Lymphs Abs: 1.3 10*3/uL (ref 0.7–4.0)
MCH: 32.6 pg (ref 26.0–34.0)
MCHC: 28.6 g/dL — ABNORMAL LOW (ref 30.0–36.0)
MCV: 114 fL — ABNORMAL HIGH (ref 80.0–100.0)
Monocytes Absolute: 0.9 10*3/uL (ref 0.1–1.0)
Monocytes Relative: 9 %
Neutro Abs: 7.6 10*3/uL (ref 1.7–7.7)
Neutrophils Relative %: 75 %
Platelets: 218 10*3/uL (ref 150–400)
RBC: 3.65 MIL/uL — ABNORMAL LOW (ref 3.87–5.11)
RDW: 13.3 % (ref 11.5–15.5)
WBC: 10.1 10*3/uL (ref 4.0–10.5)
nRBC: 0 % (ref 0.0–0.2)

## 2021-11-13 LAB — URINALYSIS, ROUTINE W REFLEX MICROSCOPIC
Bilirubin Urine: NEGATIVE
Glucose, UA: NEGATIVE mg/dL
Ketones, ur: NEGATIVE mg/dL
Nitrite: POSITIVE — AB
Protein, ur: 100 mg/dL — AB
RBC / HPF: >50 RBC/hpf — ABNORMAL HIGH (ref 0–5)
Specific Gravity, Urine: 1.023 (ref 1.005–1.030)
WBC, UA: >50 WBC/hpf — ABNORMAL HIGH (ref 0–5)
pH: 5 (ref 5.0–8.0)

## 2021-11-13 LAB — TROPONIN I (HIGH SENSITIVITY)
Troponin I (High Sensitivity): 8 ng/L (ref <18)
Troponin I (High Sensitivity): 8 ng/L (ref <18)

## 2021-11-13 LAB — COMPREHENSIVE METABOLIC PANEL
ALT: 14 U/L (ref 0–44)
AST: 21 U/L (ref 15–41)
Albumin: 2.9 g/dL — ABNORMAL LOW (ref 3.5–5.0)
Alkaline Phosphatase: 68 U/L (ref 38–126)
Anion gap: 7 (ref 5–15)
BUN: <5 mg/dL — ABNORMAL LOW (ref 8–23)
CO2: 43 mmol/L — ABNORMAL HIGH (ref 22–32)
Calcium: 8.8 mg/dL — ABNORMAL LOW (ref 8.9–10.3)
Chloride: 95 mmol/L — ABNORMAL LOW (ref 98–111)
Creatinine, Ser: 0.63 mg/dL (ref 0.44–1.00)
GFR, Estimated: >60 mL/min (ref >60)
Glucose, Bld: 120 mg/dL — ABNORMAL HIGH (ref 70–99)
Potassium: 3.1 mmol/L — ABNORMAL LOW (ref 3.5–5.1)
Sodium: 145 mmol/L (ref 135–145)
Total Bilirubin: 0.8 mg/dL (ref 0.3–1.2)
Total Protein: 6.4 g/dL — ABNORMAL LOW (ref 6.5–8.1)

## 2021-11-13 LAB — MAGNESIUM: Magnesium: 1.8 mg/dL (ref 1.7–2.4)

## 2021-11-13 LAB — SARS CORONAVIRUS 2 BY RT PCR: SARS Coronavirus 2 by RT PCR: NEGATIVE

## 2021-11-13 LAB — LIPASE, BLOOD: Lipase: 22 U/L (ref 11–51)

## 2021-11-13 LAB — LACTIC ACID, PLASMA
Lactic Acid, Venous: 1.3 mmol/L (ref 0.5–1.9)
Lactic Acid, Venous: 1.1 mmol/L (ref 0.5–1.9)

## 2021-11-13 LAB — BRAIN NATRIURETIC PEPTIDE: B Natriuretic Peptide: 17.5 pg/mL (ref 0.0–100.0)

## 2021-11-13 MED ORDER — IOHEXOL 350 MG/ML SOLN
80.0000 mL | Freq: Once | INTRAVENOUS | Status: AC | PRN
  Administered 2021-11-13: 80 mL via INTRAVENOUS

## 2021-11-13 MED ORDER — ERYTHROMYCIN 5 MG/GM OP OINT
1.0000 | TOPICAL_OINTMENT | Freq: Once | OPHTHALMIC | Status: AC
  Administered 2021-11-13: 1 via OPHTHALMIC
  Filled 2021-11-13: qty 3.5

## 2021-11-13 MED ORDER — ONDANSETRON HCL 4 MG/2ML IJ SOLN
4.0000 mg | Freq: Once | INTRAMUSCULAR | Status: AC
  Administered 2021-11-13: 4 mg via INTRAVENOUS
  Filled 2021-11-13: qty 2

## 2021-11-13 MED ORDER — POTASSIUM CHLORIDE CRYS ER 20 MEQ PO TBCR
40.0000 meq | EXTENDED_RELEASE_TABLET | Freq: Once | ORAL | Status: AC
  Administered 2021-11-13: 40 meq via ORAL
  Filled 2021-11-13: qty 2

## 2021-11-13 MED ORDER — MORPHINE SULFATE (PF) 4 MG/ML IV SOLN
4.0000 mg | Freq: Once | INTRAVENOUS | Status: AC
  Administered 2021-11-13: 4 mg via INTRAVENOUS
  Filled 2021-11-13: qty 1

## 2021-11-13 NOTE — ED Triage Notes (Signed)
Pt BIB [facility] from home d/t SOB d/t fluid retention that started worsening a week ago. Hx of open heart surgery, medication noncompliance. A/Ox4, 100% on RA, 140/100, 92 bpm.

## 2021-11-13 NOTE — ED Provider Notes (Signed)
Pt signed out by Dr. Sherry Ruffing pending labs and x-rays.  Cbc with hgb 11.9; lactic 1.3; covid neg; cmp with k low at 3.1; trop 8; lip 22  CXR and CT chest:  Films reviewed by me.  I agree with the radiologist.  CXR: IMPRESSION:  Cardiomegaly without evidence of acute cardiopulmonary disease.   CT chest: IMPRESSION:  1. No evidence of significant pulmonary embolus although vascular  crowding limits the examination.  2. Chronic elevation of the right hemidiaphragm with atelectasis in  both lung bases.  3. Gallbladder is distended.  4. Aortic atherosclerosis.   RVP pending.  Pt d/w IMTS for admission.   Megan Pence, MD 11/13/21 2053

## 2021-11-13 NOTE — ED Notes (Signed)
Patient provided with phone to update family.

## 2021-11-13 NOTE — ED Provider Notes (Signed)
[facility] Provider Note   CSN: [id number] Arrival date & time: 11/13/21  1415     History  Chief Complaint  Patient presents with   Shortness of Breath    Atalia Jerabek is a 71 y.o. female.  The history is provided by the patient and medical records. No language interpreter was used.  Shortness of Breath Severity:  Severe Onset quality:  Gradual Duration:  1 week Timing:  Constant Progression:  Unchanged Chronicity:  Recurrent Context: not URI   Relieved by:  Nothing Worsened by:  Nothing Ineffective treatments:  None tried Associated symptoms: no abdominal pain, no chest pain, no cough, no diaphoresis, no fever, no headaches, no neck pain, no rash, no sputum production, no vomiting and no wheezing        Home Medications Prior to Admission medications   Medication Sig Start Date End Date Taking? Authorizing Provider  acetaminophen (TYLENOL) 325 MG tablet Take 650 mg by mouth every 6 (six) hours as needed.    [provider]  Amino Acids-Protein Hydrolys (FEEDING SUPPLEMENT, PRO-STAT SUGAR FREE 64,) LIQD Take 30 mLs by mouth 3 (three) times daily with meals.    [provider]  atorvastatin (LIPITOR) 80 MG tablet TAKE 1 TABLET BY MOUTH ONCE DAILY AT  6  PM 12/07/16   Lelon Perla, MD  cephALEXin (KEFLEX) 500 MG capsule Take 1 capsule (500 mg total) by mouth 4 (four) times daily. 06/02/19   Deno Etienne, DO  clopidogrel (PLAVIX) 75 MG tablet Take 1 tablet (75 mg total) by mouth daily. 12/07/16   Lelon Perla, MD  collagenase (SANTYL) ointment Apply topically daily. 07/09/17   Debbe Odea, MD  fluticasone (FLONASE) 50 MCG/ACT nasal spray Place 1 spray into both nostrils daily. Patient taking differently: Place 1 spray into both nostrils daily as needed for allergies.  04/01/16   Love, Ivan Anchors, PA-C  furosemide (LASIX) 20 MG tablet Take 1 tablet (20 mg total) by mouth daily. 01/16/18   Nche, Charlene Brooke, NP   gabapentin (NEURONTIN) 100 MG capsule Take 100 mg by mouth 2 (two) times daily.    [provider]  metoprolol tartrate (LOPRESSOR) 25 MG tablet Take 12.5 mg by mouth 2 (two) times daily.    [provider]  miconazole (MICOTIN) 2 % powder Apply topically 2 times daily at 12 noon and 4 pm. 06/28/17   Nche, Charlene Brooke, NP  polyvinyl alcohol (LIQUIFILM TEARS) 1.4 % ophthalmic solution Place 2 drops into both eyes as needed for dry eyes. 07/08/17   Debbe Odea, MD  protein supplement shake (PREMIER PROTEIN) LIQD Take 325 mLs (11 oz total) by mouth 2 (two) times daily between meals. 07/08/17   Debbe Odea, MD  pyridoxine (B-6) 100 MG tablet Take 100 mg by mouth daily.    [provider]  senna-docusate (SENOKOT-S) 8.6-50 MG tablet Take 2 tablets by mouth 2 (two) times daily. Patient taking differently: Take 1 tablet by mouth at bedtime as needed for mild constipation or moderate constipation.  03/31/16   Love, Ivan Anchors, PA-C  sitaGLIPtin (JANUVIA) 50 MG tablet Take 1 tablet (50 mg total) by mouth daily. 07/08/17   Debbe Odea, MD      Allergies    Lisinopril, Metformin, and Metformin and related    Review of Systems   Review of Systems  Constitutional:  Positive for fatigue. Negative for chills, diaphoresis and fever.  HENT:  Negative for congestion.   Eyes:  Negative  for visual disturbance.  Respiratory:  Positive for shortness of breath. Negative for cough, sputum production, chest tightness, wheezing and stridor.   Cardiovascular:  Positive for leg swelling (chronic per pt). Negative for chest pain and palpitations.  Gastrointestinal:  Negative for abdominal pain, constipation, diarrhea, nausea and vomiting.  Genitourinary:  Negative for dysuria.  Musculoskeletal:  Negative for back pain, neck pain and neck stiffness.  Skin:  Positive for wound (wounds on back). Negative for rash.  Neurological:  Negative for dizziness, weakness, light-headedness, numbness and  headaches.  Psychiatric/Behavioral:  Negative for agitation and confusion.   All other systems reviewed and are negative.   Physical Exam Updated Vital Signs There were no vitals taken for this visit. Physical Exam Vitals and nursing note reviewed.  Constitutional:      General: She is not in acute distress.    Appearance: She is well-developed. She is not ill-appearing, toxic-appearing or diaphoretic.  HENT:     Head: Normocephalic and atraumatic.     Mouth/Throat:     Mouth: Mucous membranes are moist.  Eyes:     Conjunctiva/sclera: Conjunctivae normal.  Cardiovascular:     Rate and Rhythm: Normal rate and regular rhythm.     Heart sounds: No murmur heard. Pulmonary:     Effort: Pulmonary effort is normal. No respiratory distress.     Breath sounds: Rhonchi and rales present.  Chest:     Chest wall: No tenderness.  Abdominal:     Palpations: Abdomen is soft.     Tenderness: There is no abdominal tenderness.  Musculoskeletal:        General: No swelling.     Cervical back: Neck supple.     Right lower leg: Edema present.     Left lower leg: Edema present.  Skin:    General: Skin is warm and dry.     Capillary Refill: Capillary refill takes less than 2 seconds.     Comments: Pressure appearing wounds on back  Neurological:     General: No focal deficit present.     Mental Status: She is alert.  Psychiatric:        Mood and Affect: Mood normal.     ED Results / Procedures / Treatments   Labs (all labs ordered are listed, but only abnormal results are displayed) Labs Reviewed  SARS CORONAVIRUS 2 BY RT PCR  CULTURE, BLOOD (ROUTINE X 2)  CULTURE, BLOOD (ROUTINE X 2)  CBC WITH DIFFERENTIAL/PLATELET  COMPREHENSIVE METABOLIC PANEL  LACTIC ACID, PLASMA  LACTIC ACID, PLASMA  BRAIN NATRIURETIC PEPTIDE  LIPASE, BLOOD  TROPONIN I (HIGH SENSITIVITY)    EKG None  Radiology No results found.  Procedures Procedures    Medications Ordered in ED Medications -  No data to display  ED Course/ Medical Decision Making/ A&P                           Medical Decision Making   Chloie Raso is a 71 y.o. female with a complex past medical history significant for morbid obesity, CAD status post CABG, hypertension, hyperlipidemia, type 2 diabetes, previous stroke, and previous diastolic heart failure who presents for shortness of breath and fatigue.  According to patient, she feels that she is fluid overloaded for the last week and is getting short of breath.  She says her legs are all swollen and do not seem different to her today but she feels that she is holding fluid in her  lungs.  She denies any chest pain or cough and denies fevers or chills.  She denies any abdominal pain.  She reports her back is not hurting although she does agree that she has some pressure wounds on her back.  She denies constipation, diarrhea, or urinary changes.  Denies any palpitations.  On arrival, patient was found to be hypoxic with oxygen saturation of 86% on room air.  She was placed on nasal cannula oxygen and her oxygen saturations are now in the 90s.  On exam, her lungs did have some rales and rhonchi bilaterally.  Chest and abdomen were nontender.  She was rolled and she does have wounds across her back that appear to be pressure ulcers however there was not significant purulence or erythema.  No palpable abscesses felt on exam.  Legs are slightly edematous but she reports is unchanged from baseline.  Due to her new hypoxia, anticipate she will need admission.  With her history of CHF and sensation of fluid overload and rales on exam I do suspect this could be the cause.  We will however get a COVID test given the ongoing pandemic, chest x-ray, and labs.  Given her lack of chest pain or tachycardia, I have low suspicion for a thromboembolic etiology of symptoms.  Care transferred to oncoming team to await for work-up to be completed however I anticipate she will need admission  due to new oxygen requirement.        Final Clinical Impression(s) / ED Diagnoses Final diagnoses:  Hypoxia  SOB (shortness of breath)     Clinical Impression: 1. Hypoxia   2. SOB (shortness of breath)     Disposition: Care transferred to oncoming team to await for work-up to be completed however I anticipate she will need admission due to new oxygen requirement.  This note was prepared with assistance of Systems analyst. Occasional wrong-word or sound-a-like substitutions may have occurred due to the inherent limitations of voice recognition software.      Jamae Tison, Gwenyth Allegra, MD 11/13/21 1510

## 2021-11-13 NOTE — Care Plan (Signed)
Attempted to get pt. For CTA, pt. States she will need pain meds prior due to not being able to lay flat.

## 2021-11-13 NOTE — Hospital Course (Addendum)
    71yo living with a hx of HTN, CAD, s/p CABG, diastolic CHF, morbid obesity, OSA, DM, cerebellar CVA in 2018 with residual***,  No o2 at home   Reports subjective fever but denies any SOB, cough, CP, abd pain, diarrhea, dysuria, leg swelling  Fever and SOB, gradual onset Bedbound Decubitous ulcers - groin, bottom  SH: She lives by herself. She take her own meds that comes in the mail in a pill pack. Uses a walker to ambulate but usually laying in her chair. She gets Meals on Wheels   ------  Homecare visits once a month   CT PE - no PE, no PNA,  Atelectasis on bilateral lung bases R>L Chronic elevation of hemiaphragm ???phgenic nerve injury  ---- Acute Hypoxic respiratory failure Subacute cough without sputum production Conjuctivitis Respiratory viral panel On 3L No fevers, no leukocytosis   CHF, no compliant with medicine furosemide 20mg  Last ECHO 2019 65-70% EF No fluid overloaded on exam  OSA Non compliant with CPAP   HTN CAD S/p CABG -metoprolol 25mg    DM -gabapentine   Decubitous ulcers  L PICA CVA Clopidogrel 75mg    10/10  Slept well, feeeling much better. Has her hospital bed at home.    No urinary urgency,   Has small meals, drink fluids, her aide heats meals up at lunch time. Dinner time gets finger foods.   Gets meals on wheels.   Would not like to go to SNF.  Patient will call NP and get appointment.

## 2021-11-13 NOTE — ED Notes (Signed)
To CT

## 2021-11-14 DIAGNOSIS — J9621 Acute and chronic respiratory failure with hypoxia: Secondary | ICD-10-CM

## 2021-11-14 DIAGNOSIS — G4733 Obstructive sleep apnea (adult) (pediatric): Secondary | ICD-10-CM

## 2021-11-14 DIAGNOSIS — L8990 Pressure ulcer of unspecified site, unspecified stage: Secondary | ICD-10-CM | POA: Insufficient documentation

## 2021-11-14 DIAGNOSIS — I5030 Unspecified diastolic (congestive) heart failure: Secondary | ICD-10-CM | POA: Diagnosis not present

## 2021-11-14 DIAGNOSIS — R0902 Hypoxemia: Secondary | ICD-10-CM | POA: Diagnosis present

## 2021-11-14 LAB — RESPIRATORY PANEL BY PCR

## 2021-11-14 LAB — CBC
HCT: 42.5 % (ref 36.0–46.0)
Hemoglobin: 12 g/dL (ref 12.0–15.0)
MCH: 32.3 pg (ref 26.0–34.0)
MCHC: 28.2 g/dL — ABNORMAL LOW (ref 30.0–36.0)
MCV: 114.2 fL — ABNORMAL HIGH (ref 80.0–100.0)
Platelets: 230 10*3/uL (ref 150–400)
RBC: 3.72 MIL/uL — ABNORMAL LOW (ref 3.87–5.11)
RDW: 13.4 % (ref 11.5–15.5)
WBC: 9.9 10*3/uL (ref 4.0–10.5)
nRBC: 0 % (ref 0.0–0.2)

## 2021-11-14 LAB — GLUCOSE, CAPILLARY
Glucose-Capillary: 108 mg/dL — ABNORMAL HIGH (ref 70–99)
Glucose-Capillary: 116 mg/dL — ABNORMAL HIGH (ref 70–99)
Glucose-Capillary: 93 mg/dL (ref 70–99)

## 2021-11-14 LAB — BASIC METABOLIC PANEL
Anion gap: 10 (ref 5–15)
BUN: <5 mg/dL — ABNORMAL LOW (ref 8–23)
CO2: 41 mmol/L — ABNORMAL HIGH (ref 22–32)
Calcium: 8.7 mg/dL — ABNORMAL LOW (ref 8.9–10.3)
Chloride: 95 mmol/L — ABNORMAL LOW (ref 98–111)
Creatinine, Ser: 0.66 mg/dL (ref 0.44–1.00)
GFR, Estimated: >60 mL/min (ref >60)
Glucose, Bld: 118 mg/dL — ABNORMAL HIGH (ref 70–99)
Potassium: 3.2 mmol/L — ABNORMAL LOW (ref 3.5–5.1)
Sodium: 146 mmol/L — ABNORMAL HIGH (ref 135–145)

## 2021-11-14 LAB — LIPID PANEL
Cholesterol: 195 mg/dL (ref 0–200)
HDL: 39 mg/dL — ABNORMAL LOW (ref >40)
LDL Cholesterol: 132 mg/dL — ABNORMAL HIGH (ref 0–99)
Total CHOL/HDL Ratio: 5 RATIO
Triglycerides: 118 mg/dL (ref <150)
VLDL: 24 mg/dL (ref 0–40)

## 2021-11-14 LAB — HEMOGLOBIN A1C
Hgb A1c MFr Bld: 5.1 % (ref 4.8–5.6)
Mean Plasma Glucose: 99.67 mg/dL

## 2021-11-14 LAB — CBG MONITORING, ED: Glucose-Capillary: 113 mg/dL — ABNORMAL HIGH (ref 70–99)

## 2021-11-14 MED ORDER — ENOXAPARIN SODIUM 40 MG/0.4ML IJ SOSY
40.0000 mg | PREFILLED_SYRINGE | INTRAMUSCULAR | Status: DC
  Administered 2021-11-14 – 2021-11-16 (×3): 40 mg via SUBCUTANEOUS
  Filled 2021-11-14 (×3): qty 0.4

## 2021-11-14 MED ORDER — POTASSIUM CHLORIDE CRYS ER 20 MEQ PO TBCR
40.0000 meq | EXTENDED_RELEASE_TABLET | ORAL | Status: AC
  Administered 2021-11-14 (×2): 40 meq via ORAL
  Filled 2021-11-14 (×2): qty 2

## 2021-11-14 MED ORDER — ATORVASTATIN CALCIUM 80 MG PO TABS
80.0000 mg | ORAL_TABLET | Freq: Every day | ORAL | Status: DC
  Administered 2021-11-14 – 2021-11-16 (×3): 80 mg via ORAL
  Filled 2021-11-14: qty 1
  Filled 2021-11-14: qty 2
  Filled 2021-11-14: qty 1

## 2021-11-14 MED ORDER — SENNOSIDES-DOCUSATE SODIUM 8.6-50 MG PO TABS: 1.0000 | ORAL_TABLET | Freq: Every evening | ORAL | Status: DC | PRN

## 2021-11-14 MED ORDER — INFLUENZA VAC A&B SA ADJ QUAD 0.5 ML IM PRSY
0.5000 mL | PREFILLED_SYRINGE | INTRAMUSCULAR | Status: AC
  Administered 2021-11-15: 0.5 mL via INTRAMUSCULAR
  Filled 2021-11-14: qty 0.5

## 2021-11-14 MED ORDER — POLYVINYL ALCOHOL 1.4 % OP SOLN: 2.0000 [drp] | OPHTHALMIC | Status: DC | PRN

## 2021-11-14 MED ORDER — FUROSEMIDE 20 MG PO TABS
20.0000 mg | ORAL_TABLET | Freq: Every day | ORAL | Status: DC
  Administered 2021-11-14 – 2021-11-16 (×3): 20 mg via ORAL
  Filled 2021-11-14 (×3): qty 1

## 2021-11-14 MED ORDER — ERYTHROMYCIN 5 MG/GM OP OINT: TOPICAL_OINTMENT | Freq: Three times a day (TID) | OPHTHALMIC | Status: DC | PRN

## 2021-11-14 MED ORDER — ENSURE ENLIVE PO LIQD
237.0000 mL | Freq: Two times a day (BID) | ORAL | Status: DC
  Administered 2021-11-14 – 2021-11-16 (×4): 237 mL via ORAL
  Filled 2021-11-14 (×2): qty 237

## 2021-11-14 MED ORDER — ZINC OXIDE 40 % EX OINT
TOPICAL_OINTMENT | Freq: Two times a day (BID) | CUTANEOUS | Status: DC
  Filled 2021-11-14 (×2): qty 57

## 2021-11-14 MED ORDER — NYSTATIN 100000 UNIT/GM EX POWD
Freq: Two times a day (BID) | CUTANEOUS | Status: DC
  Filled 2021-11-14: qty 15

## 2021-11-14 MED ORDER — MICONAZOLE NITRATE 2 % EX POWD: Freq: Two times a day (BID) | CUTANEOUS | Status: DC

## 2021-11-14 MED ORDER — MEDIHONEY WOUND/BURN DRESSING EX PSTE
1.0000 | PASTE | Freq: Every day | CUTANEOUS | Status: DC
  Administered 2021-11-15 – 2021-11-16 (×2): 1 via TOPICAL
  Filled 2021-11-14: qty 44

## 2021-11-14 MED ORDER — CLOPIDOGREL BISULFATE 75 MG PO TABS
75.0000 mg | ORAL_TABLET | Freq: Every day | ORAL | Status: DC
  Administered 2021-11-14 – 2021-11-16 (×3): 75 mg via ORAL
  Filled 2021-11-14 (×3): qty 1

## 2021-11-14 MED ORDER — ZINC OXIDE 40 % EX OINT: TOPICAL_OINTMENT | CUTANEOUS | Status: DC | PRN

## 2021-11-14 MED ORDER — INSULIN ASPART 100 UNIT/ML IJ SOLN: 0.0000 [IU] | Freq: Three times a day (TID) | INTRAMUSCULAR | Status: DC

## 2021-11-14 MED ORDER — ACETAMINOPHEN 325 MG PO TABS
650.0000 mg | ORAL_TABLET | Freq: Four times a day (QID) | ORAL | Status: DC | PRN
  Administered 2021-11-14 – 2021-11-16 (×2): 650 mg via ORAL
  Filled 2021-11-14 (×2): qty 2

## 2021-11-14 MED ORDER — ACETAMINOPHEN 650 MG RE SUPP: 650.0000 mg | Freq: Four times a day (QID) | RECTAL | Status: DC | PRN

## 2021-11-14 MED ORDER — GABAPENTIN 100 MG PO CAPS: 100.0000 mg | ORAL_CAPSULE | Freq: Two times a day (BID) | ORAL | Status: DC

## 2021-11-14 NOTE — Evaluation (Signed)
Physical Therapy Evaluation Patient Details Name: Megan Atkinson MRN: [id number] DOB: 15-Oct-1950 Today's Date: 11/14/2021  History of Present Illness  71 yo female presenting to ED on 10/8 with shortness of breath since her hospital bed at home malfunctioned and she was unable to sit upright. PMH including chronic hypoxic respiratory failure, OSA, HFpEF, HTN, HLD, CAD s/p  CABG, L cerebellar CVA with residual weakness.  Clinical Impression  Pt admitted with above diagnosis. Pt was unable to come to sitting at edge of stretcher due to need for at least 2 persons and did nothave 2 person assist today from stretcher. Pt reports she was working on sitting on EOB with therapist 1 day a week at home and otherwise stayed in bed and aide came daily to assist pt. Pt has been functioning marginally per pt report and since her bed broke she reports it is more difficult. Feel that a SNF stay would benefit pt. Will follow acutely.  Pt currently with functional limitations due to the deficits listed below (see PT Problem List). Pt will benefit from skilled PT to increase their independence and safety with mobility to allow discharge to the venue listed below.          Recommendations for follow up therapy are one component of a multi-disciplinary discharge planning process, led by the attending physician.  Recommendations may be updated based on patient status, additional functional criteria and insurance authorization.  Follow Up Recommendations Skilled nursing-short term rehab (<3 hours/day) Can patient physically be transported by private vehicle: No    Assistance Recommended at Discharge Frequent or constant Supervision/Assistance  Patient can return home with the following  Two people to help with walking and/or transfers;Two people to help with bathing/dressing/bathroom;Assistance with cooking/housework;Assistance with feeding;Assist for transportation;Help with stairs or ramp for entrance    Equipment  Recommendations Other (comment);Hospital bed (Pt requests fixing of current hospital bed)  Recommendations for Other Services       Functional Status Assessment Patient has had a recent decline in their functional status and demonstrates the ability to make significant improvements in function in a reasonable and predictable amount of time.     Precautions / Restrictions Precautions Precautions: Fall Precaution Comments: L hemi; R lean Restrictions Weight Bearing Restrictions: No      Mobility  Bed Mobility               General bed mobility comments: Defer EOB activity at this time, due to patients weakness and decreasesd safety on higher stretcher.    Transfers                   General transfer comment: Defer    Ambulation/Gait                  Stairs            Wheelchair Mobility    Modified Rankin (Stroke Patients Only)       Balance                                             Pertinent Vitals/Pain Pain Assessment Pain Assessment: No/denies pain    Home Living Family/patient expects to be discharged to:: Private residence Living Arrangements: Alone Available Help at Discharge: Personal care attendant ("I think I get three hours a day") Type of Home: House Home Access: Level entry  Home Layout: Multi-level;Able to live on main level with bedroom/bathroom Home Equipment: Rolling Walker (2 wheels);Wheelchair - power (Advertising copywriter; hospital bed (not working)) Additional Comments: HHPT working on sitting on side of bed - Kendra with [employer] per pt.  Pt states that hospital bed is broken currently and that it came from [facility].    Prior Function Prior Level of Function : Needs assist             Mobility Comments: PT is working with pt to sit at EOB and sitting balance. Using lift chair to practice sit<>stand. Unsure of how successful this is as pt with significant plantar flexion at  BLEs.  Pt mentioning she needs transfer board and unsure if she used sliding board at one time? ADLs Comments: Aide assisting with all ADLs at bed level.     Hand Dominance   Dominant Hand: Right    Extremity/Trunk Assessment   Upper Extremity Assessment Upper Extremity Assessment: Defer to OT evaluation LUE Deficits / Details: Zettie Pho to actively move hand, elbow, and shoulder. However, very limited. LUE Coordination: decreased gross motor    Lower Extremity Assessment Lower Extremity Assessment: RLE deficits/detail;LLE deficits/detail RLE Deficits / Details: plantar flexion contracture, grossly 2-/5 LLE Deficits / Details: plantar flexion contracture, grossly 2-/5 (    Cervical / Trunk Assessment Cervical / Trunk Assessment: Other exceptions Cervical / Trunk Exceptions: Significant R lateral lean or trunk and cannot correct posture without extensive assist.  Communication   Communication: No difficulties  Cognition Arousal/Alertness: Awake/alert Behavior During Therapy: WFL for tasks assessed/performed Overall Cognitive Status: No family/caregiver present to determine baseline cognitive functioning Area of Impairment: Following commands, Memory, Problem solving                     Memory: Decreased short-term memory Following Commands: Follows one step commands with increased time     Problem Solving: Slow processing General Comments: Conversational and answering all questions. Pt requiring increased time throughout to follow commands. Presenting with moment of ST memory defciits        General Comments General comments (skin integrity, edema, etc.): 106-110 bpm, 135/76, 95% on 2LO2.    Exercises General Exercises - Lower Extremity Ankle Circles/Pumps: AAROM, Both, 10 reps, Supine Heel Slides: AAROM, Both, 10 reps, Supine   Assessment/Plan    PT Assessment Patient needs continued PT services  PT Problem List Decreased activity tolerance;Decreased  balance;Decreased strength;Decreased range of motion;Decreased knowledge of use of DME;Decreased safety awareness;Decreased knowledge of precautions;Obesity       PT Treatment Interventions DME instruction;Functional mobility training;Therapeutic activities;Therapeutic exercise;Balance training;Patient/family education;Wheelchair mobility training    PT Goals (Current goals can be found in the Care Plan section)  Acute Rehab PT Goals Patient Stated Goal: to go home PT Goal Formulation: With patient Time For Goal Achievement: 11/28/21 Potential to Achieve Goals: Good    Frequency Min 2X/week     Co-evaluation               AM-PAC PT "6 Clicks" Mobility  Outcome Measure Help needed turning from your back to your side while in a flat bed without using bedrails?: Total Help needed moving from lying on your back to sitting on the side of a flat bed without using bedrails?: Total Help needed moving to and from a bed to a chair (including a wheelchair)?: Total Help needed standing up from a chair using your arms (e.g., wheelchair or bedside chair)?: Total Help needed to walk  in hospital room?: Total Help needed climbing 3-5 steps with a railing? : Total 6 Click Score: 6    End of Session Equipment Utilized During Treatment: Oxygen Activity Tolerance: Patient limited by fatigue Patient left: in bed;with call bell/phone within reach (on stretcher) Nurse Communication: Mobility status;Need for lift equipment PT Visit Diagnosis: Muscle weakness (generalized) (M62.81)    Time: 8250-5397 PT Time Calculation (min) (ACUTE ONLY): 11 min   Charges:   PT Evaluation $PT Eval Moderate Complexity: 1 Mod          Laronica Bhagat M,PT Acute Rehab Services [phone]   Bevelyn Buckles 11/14/2021, 11:07 AM

## 2021-11-14 NOTE — ED Notes (Signed)
Patient provided with perineal care and bed bath. Patient has multiple locations of severe skin breakdown on her buttocks and lower back. Wounds were cleansed and mepiplex dressing was applied. Patient provided with clean sheets/chucks pad/purewick and fresh warm blankets. Patient's heels were floated and R sided readjusted with pillows. Patient resting comfortably with no other complaints at this time

## 2021-11-14 NOTE — Evaluation (Signed)
Occupational Therapy Evaluation Patient Details Name: Megan Atkinson MRN: [id number] DOB: 1951/01/14 Today's Date: 11/14/2021   History of Present Illness 71 yo female presenting to ED on 10/8 with shortness of breath since her hospital bed at home malfunctioned and she was unable to sit upright. PMH including chronic hypoxic respiratory failure, OSA, HFpEF, HTN, HLD, CAD s/p  CABG, L cerebellar CVA with residual weakness.   Clinical Impression   PTA, pt was living alone and has an aide for three hours each day who assists patient with all ADLs at bed level. Pt reports she has HHPT who was working on sitting balance at EOB and then sit<>stands with lift chair. Pt highly motivated to participate in therapy. Currently she is requiring Max-Total A for bathing, dressing, and toileting at bed level. Pt presenting with worsened weakness, balance, activity tolerance, and cognition impacting her occupational participation. Pt would benefit from further acute OT to facilitate safe dc. Recommend dc to SNF for further OT to optimize safety, independence with ADLs, and return to PLOF.      Recommendations for follow up therapy are one component of a multi-disciplinary discharge planning process, led by the attending physician.  Recommendations may be updated based on patient status, additional functional criteria and insurance authorization.   Follow Up Recommendations  Skilled nursing-short term rehab (<3 hours/day)    Assistance Recommended at Discharge Frequent or constant Supervision/Assistance  Patient can return home with the following Two people to help with walking and/or transfers;Two people to help with bathing/dressing/bathroom    Functional Status Assessment  Patient has had a recent decline in their functional status and demonstrates the ability to make significant improvements in function in a reasonable and predictable amount of time.  Equipment Recommendations  Other (comment) (New  hospital bed)    Recommendations for Other Services PT consult     Precautions / Restrictions Precautions Precautions: Fall Precaution Comments: L hemi; R lean Restrictions Weight Bearing Restrictions: No      Mobility Bed Mobility               General bed mobility comments: Defer EOB activity at this time, due to patients weakness and decreasesd safety on higher stretcher.    Transfers                   General transfer comment: Defer      Balance                                           ADL either performed or assessed with clinical judgement   ADL Overall ADL's : Needs assistance/impaired Eating/Feeding: Set up;Bed level Eating/Feeding Details (indicate cue type and reason): Pt eating breakfast upon arrival and presenting with significant R lean. Assisting to reposition to L with pillows. Repostiioning set up of food. Pt using R hand to eat pancake (finger food) Grooming: Wash/dry face;Minimal assistance;Bed level Grooming Details (indicate cue type and reason): Difficulty bringing RUE to face.Wiping mouth but fatigues quickly Upper Body Bathing: Maximal assistance;Bed level   Lower Body Bathing: Maximal assistance;Sit to/from stand   Upper Body Dressing : Maximal assistance;Sitting   Lower Body Dressing: Maximal assistance;Sit to/from stand                 General ADL Comments: Pt with decreased strength,. balance, and activity tolerance. Defer EOB activity at this time, due to patients  weakness and decreasesd safety on higher stretcher. Pt working on pulling forward with hands of rails and grooming/self feeding in bed.     Vision         Perception     Praxis      Pertinent Vitals/Pain Pain Assessment Pain Assessment: No/denies pain     Hand Dominance Right   Extremity/Trunk Assessment Upper Extremity Assessment Upper Extremity Assessment: LUE deficits/detail LUE Deficits / Details: Carlyle Lipa to actively move  hand, elbow, and shoulder. However, very limited. LUE Coordination: decreased gross motor   Lower Extremity Assessment Lower Extremity Assessment: Defer to PT evaluation   Cervical / Trunk Assessment Cervical / Trunk Assessment: Other exceptions Cervical / Trunk Exceptions: Significant R lateral lean   Communication Communication Communication: No difficulties   Cognition Arousal/Alertness: Awake/alert Behavior During Therapy: WFL for tasks assessed/performed Overall Cognitive Status: No family/caregiver present to determine baseline cognitive functioning Area of Impairment: Following commands, Memory, Problem solving                     Memory: Decreased short-term memory Following Commands: Follows one step commands with increased time     Problem Solving: Slow processing General Comments: Very appreciative. Conversational and answering all questions. Pt requiring increased time throughout to follow commands. Presenting with moment of ST memory defciits; OT leaving while pt talked with doctor and upon return, pt stating "you look familiar"     General Comments  VSS on RA    Exercises Exercises: Other exercises Other Exercises Other Exercises: Holding bedrails and pulling forward with Max A for trunk control.   Shoulder Instructions      Home Living Family/patient expects to be discharged to:: Private residence Living Arrangements: Alone Available Help at Discharge: Personal care attendant ("I think I get three hours a day") Type of Home: House Home Access: Level entry     Home Layout: Multi-level;Able to live on main level with bedroom/bathroom     Bathroom Shower/Tub: Sponge bathes at baseline (bedlevel)   Bathroom Toilet:  (using purwick; BM in bed)     Home Equipment: Rolling Walker (2 wheels);Wheelchair - power (Designer, fashion/clothing; hospital bed (not working))   Additional Comments: HHPT working on sitting on side of bed - Kendra with [employer]. P       Prior Functioning/Environment Prior Level of Function : Needs assist             Mobility Comments: PT is working with pt to sit at EOB and sitting balance. Using lift chair to practice sit<>stand. Unsure of how successful this is as pt with significant plantar flexion at BLEs ADLs Comments: Aide assisting with all ADLs at bed level.        OT Problem List: Decreased strength;Decreased range of motion;Decreased activity tolerance;Impaired balance (sitting and/or standing);Decreased knowledge of use of DME or AE;Decreased knowledge of precautions      OT Treatment/Interventions: Self-care/ADL training;Therapeutic exercise;Energy conservation;DME and/or AE instruction;Therapeutic activities;Patient/family education    OT Goals(Current goals can be found in the care plan section) Acute Rehab OT Goals Patient Stated Goal: "I would love to get stronger at rehab" OT Goal Formulation: With patient Time For Goal Achievement: 11/14/21 Potential to Achieve Goals: Good  OT Frequency: Min 2X/week    Co-evaluation              AM-PAC OT "6 Clicks" Daily Activity     Outcome Measure Help from another person eating meals?: A Little Help from another person taking  care of personal grooming?: A Little Help from another person toileting, which includes using toliet, bedpan, or urinal?: Total Help from another person bathing (including washing, rinsing, drying)?: A Lot Help from another person to put on and taking off regular upper body clothing?: A Lot Help from another person to put on and taking off regular lower body clothing?: Total 6 Click Score: 12   End of Session Nurse Communication: Mobility status  Activity Tolerance: Patient limited by fatigue Patient left: in chair;with call bell/phone within reach  OT Visit Diagnosis: Unsteadiness on feet (R26.81);Other abnormalities of gait and mobility (R26.89);Muscle weakness (generalized) (M62.81)                Time:  [id number] OT Time Calculation (min): 27 min Charges:  OT General Charges $OT Visit: 1 Visit OT Evaluation $OT Eval Moderate Complexity: 1 Mod OT Treatments $Self Care/Home Management : 8-22 mins  Tellis Spivak MSOT, OTR/L Acute Rehab Office: [place] 11/14/2021, 10:23 AM

## 2021-11-14 NOTE — Consult Note (Addendum)
[facility] Nurse Consult Note: Reason for Consult: Consult requested for buttocks and skin folds.  Performed remotely after review of progress notes and photos in the EMR.  Wound type: Pt has multiple areas of red moist macerated skin loss to bilat buttocks/gluteal fold/sacrum and abd skin folds.  Appearance is consistent with moisture associated skin damage.   ICD-10 CM Codes for Irritant Dermatitis L24A2 - Due to fecal, urinary or dual incontinence L30.4  - Erythema intertrigo: dermatitis due to sweating and friction and genital/thigh intertrigo.   Dressing procedure/placement/frequency: Topical treatment orders provided for bedside nurses to perform as follows to protect from further injury, wick moisture away from the skin and promote healing:  1. Apply Desitin to buttocks/sacrum/inner gluteal fold BID and PRN when turning or cleaning 2. Measure and cut length of InterDry to fit in skin folds that have skin breakdown  Tuck InterDry fabric into skin folds in a single layer, allow for 2 inches of overhang from skin edges to allow for wicking to occur May remove to bathe; dry area thoroughly and then tuck into affected areas again  Do not apply any creams or ointments when using InterDry DO NOT THROW AWAY FOR 5 DAYS unless soiled with stool DO NOT [facility] product, this will inactivate the silver in the material  New sheet of Interdry should be applied after 5 days of use if patient continues to have skin breakdown Discontinue use of current sheet after 10/13  Please re-consult if further assistance is needed.  Thank-you,  Julien Girt MSN, [place], [place], [place], [place]

## 2021-11-14 NOTE — H&P (Signed)
Date: 11/14/2021               Patient Name:  Megan Atkinson MRN: [id number]  DOB: 08-Jun-1950 Age / Sex: 71 y.o., female   PCP: Annita Brod, MD         Medical Service: Internal Medicine Teaching Service         Attending Physician: : Dr. Mayford Knife    First Contact: Carmina Miller, MS4      Pager: [phone]   Second Contact: Marolyn Haller, MD      Pager: [phone]        After Hours (After 5p/  First Contact Pager: [phone]  weekends / holidays): Second Contact Pager: [phone]   SUBJECTIVE   Chief Complaint: shortness of breath  History of Present Illness:  [name] is 71yo person living with a history of chronic hypoxic respiratory failure, OSA, HFpEF, HTN, HLD, CAD s/p  CABG, L cerebellar CVA with residual weakness, presenting to ED with shortness of breath  Patient reports that her home hospital bed has been malfunctioning for >1 week. For the past 24 hours she had to lay in bed uncomfortably and had difficulty breathing. She usually wears a [place] that she did not have when this was happening. She was on RA. Because she lives alone, she got nervous that she would have to be uncomfortable in bed the whole night, unable to breath okay, and potentially die. She has an emergency alert necklace, so she called EMS and she was brought to the hospital.  At this time, patient denies SOB, runny nose, sore throat, cough, sputum production, fevers, chills, night sweats, or new myalgias. Patient is compliant with home medications, which she receives on bubble packs in the mail. She did not take her medications this AM as her positioning did not allow her to swallow medications. She is aware she needs CPAP, but, again, patient is bedbound and has difficulty setting machine at bedtime.   She denies feeling bloated or fluid overloaded. She states she feels better than when she called EMS, stating she is back at her usual.   Patient had been working with [facility] PT 3x/week until  04/2021, and transitioned to 1x/week in 05/2021. There is no record of patient working with PT since there. However, NP has sent a referral to restart PT as of 11/05/2021.  Son Megan Atkinson: Patient is on 3L of supplemental O2 at baseline. She was discharged from a hospitalization with this new O2 requirements ~ 3 years ago.  There is an aide that helps her with basic care most days a week. An NP comes out to evaluate her once a month and assesses pt's specialist and care needs  ED course: patient presented with SOB, ronchi and rales on lower lung bases, ound to have multiple pressure ulcers on back without purulence, and trace edema on B/l LE. Patient was hypoxic with SaO2 to 86% on RA then transitioned to . CBC without leukocytosis, CMP low K, normal troponins and lipase wnl. CXR with chronic lower lung atelectasis and R hemidiaphragm elevation. Ct chest without acute PE. No acute cardiopulmonary disease. Given patient CHF history and clinical presentation, patient was admitted for further work up and evaluation  Meds:  No outpatient medications have been marked as taking for the 11/13/21 encounter [facility] Encounter).    Past Medical History  Past Surgical History:  Procedure Laterality Date   ABDOMINAL HYSTERECTOMY     APPENDECTOMY     CORONARY ARTERY BYPASS GRAFT  01/12/2011  Procedure: CORONARY ARTERY BYPASS GRAFTING (CABG);  Surgeon: Alleen Borne, MD;  Location: [facility] OR;  Service: Open Heart Surgery;  Laterality: N/A;  coronary artery bypass graft times one using left internal mammary artery . Attempted endoscopic saphenous vein harvest   LEFT HEART CATHETERIZATION WITH CORONARY ANGIOGRAM N/A 01/06/2011   Procedure: LEFT HEART CATHETERIZATION WITH CORONARY ANGIOGRAM;  Surgeon: Rollene Rotunda, MD;  Location: [facility] CATH LAB;  Service: Cardiovascular;  Laterality: N/A;    Social:  Lives With: by herself in [place], [place]; she is assisted by [facility] aide, an NP that visits her monthly for medical  assessments. Has 2 sons, one of them lives in [place], [place] and is the primary contact. Level of Function: Patient is mostly bedbound at baseline, she is incontinent, uses a PurWick and briefs, is helped by [facility] aide to give sponge baths, and set food and med packs on side table next to her hospital bed. She gets delivered Meals on Wheels daily. She receives medications in bubble packs, numbered, which she takes daily. She does not go to appointments any more as it is difficult to be out of bed since CVA in 2018.  PCP: Pollie Friar, MD Substances: Never smoker, no EtOH use, no street or nonprescription drug use.   Family History: Not obtained  Allergies: Allergies as of 11/13/2021 - Review Complete 11/13/2021  Allergen Reaction Noted   Lisinopril Cough 04/05/2011   Metformin Other (See Comments) 05/27/2018   Metformin and related Diarrhea 10/02/2014    Review of Systems: A complete ROS was negative except as per HPI.   OBJECTIVE:   Physical Exam: Blood pressure 111/75, pulse 94, temperature 98 F (36.7 C), temperature source Oral, resp. rate 18, SpO2 99 %.  Constitutional: ill-appearing woman with large body habitus, leaning toward right side, leaning head on bedrail, in no acute distress HENT: normocephalic atraumatic, mucous membranes moist, R eye with conjunctival erythema and non purulence; scoriations on R upper lid. Cardiovascular: regular rate and rhythm, no m/r/g, No JVD Pulmonary/Chest: normal work of breathing on room air, lungs clear to auscultation bilaterally. No crackles  Abdominal: soft, non-tender, non-distended. No fluid wave, No asterixis Neurological: alert & oriented x 3 MSK: atrophied muscular mass in the bilateral upper and lower extremities. Patient unable to hold herself up in bed. Bilateral feet with skin breakdown and elongated and curved toe nails, feet appear chronically everted. Unable to mover LE without assistance. Full ROM at ankle. Bilateral heels with thick  scale and breakdown. Trace pitting edema Skin: warm, with diffuse, overlaying dry, yellow-brown scaly, and malodorous patches. Skin folds in the back, groin, under breast, and inner legs with evidence of skin breakdown, and overlaying white patches without erythema or purulence.  Psych: Normal mood and affect  Left side of body: L flank     L inguinal fold     R foot        L foot   Labs: CBC    Component Value Date/Time   WBC 10.1 11/13/2021 1633   RBC 3.65 (L) 11/13/2021 1633   HGB 11.9 (L) 11/13/2021 1633   HCT 41.6 11/13/2021 1633   PLT 218 11/13/2021 1633   MCV 114.0 (H) 11/13/2021 1633   MCH 32.6 11/13/2021 1633   MCHC 28.6 (L) 11/13/2021 1633   RDW 13.3 11/13/2021 1633   LYMPHSABS 1.3 11/13/2021 1633   MONOABS 0.9 11/13/2021 1633   EOSABS 0.2 11/13/2021 1633   BASOSABS 0.0 11/13/2021 1633     CMP  Component Value Date/Time   NA 145 11/13/2021 1449   NA 146 04/25/2018 0000   K 3.1 (L) 11/13/2021 1449   CL 95 (L) 11/13/2021 1449   CO2 43 (H) 11/13/2021 1449   GLUCOSE 120 (H) 11/13/2021 1449   BUN <5 (L) 11/13/2021 1449   BUN 15 04/25/2018 0000   CREATININE 0.63 11/13/2021 1449   CREATININE 1.08 (H) 11/08/2015 1248   CALCIUM 8.8 (L) 11/13/2021 1449   PROT 6.4 (L) 11/13/2021 1449   PROT 6.5 02/14/2017 1135   ALBUMIN 2.9 (L) 11/13/2021 1449   ALBUMIN 3.8 02/14/2017 1135   AST 21 11/13/2021 1449   ALT 14 11/13/2021 1449   ALKPHOS 68 11/13/2021 1449   BILITOT 0.8 11/13/2021 1449   BILITOT <0.2 02/14/2017 1135   GFRNONAA >60 11/13/2021 1449   GFRAA >60 06/02/2019 1538    Imaging: CT Angio Chest PE W and/or Wo Contrast  Result Date: 11/13/2021 CLINICAL DATA:  Pulmonary embolus suspected with high probability. EXAM: CT ANGIOGRAPHY CHEST WITH CONTRAST TECHNIQUE: Multidetector CT imaging of the chest was performed using the standard protocol during bolus administration of intravenous contrast. Multiplanar CT image reconstructions and MIPs were obtained to  evaluate the vascular anatomy. RADIATION DOSE REDUCTION: This exam was performed according to the departmental dose-optimization program which includes automated exposure control, adjustment of the mA and/or kV according to patient size and/or use of iterative reconstruction technique. CONTRAST:  77mL OMNIPAQUE IOHEXOL 350 MG/ML SOLN COMPARISON:  07/29/2017 FINDINGS: Cardiovascular: Vascular crowding due to shallow inspiration limits examination. There is moderately good visualization of the central and proximal segmental pulmonary arteries. No filling defects are demonstrated suggesting no evidence of significant pulmonary embolus. Heart size is normal. Normal caliber thoracic aorta. No aortic dissection. Coronary artery and aortic calcifications. Postoperative changes consistent with coronary bypass. Mediastinum/Nodes: Esophagus is decompressed. No significant lymphadenopathy. Lungs/Pleura: Shallow inspiration with elevation of the right hemidiaphragm representing chronic finding which was also present on the prior study. Atelectasis in both lung bases, greater on the right. No pleural effusions. No pneumothorax. Upper Abdomen: The gallbladder appears distended. Unable to assess for gallstones due to artifact. Musculoskeletal: Sternotomy wires. Degenerative changes in the spine. No acute bony abnormalities. Review of the MIP images confirms the above findings. IMPRESSION: 1. No evidence of significant pulmonary embolus although vascular crowding limits the examination. 2. Chronic elevation of the right hemidiaphragm with atelectasis in both lung bases. 3. Gallbladder is distended. 4. Aortic atherosclerosis. Electronically Signed   By: Lucienne Capers M.D.   On: 11/13/2021 20:12   DG Chest Portable 1 View  Result Date: 11/13/2021 CLINICAL DATA:  Shortness of breath. EXAM: PORTABLE CHEST 1 VIEW COMPARISON:  06/02/2019 and prior radiographs FINDINGS: Cardiomegaly, median sternotomy and elevated RIGHT  hemidiaphragm again noted. There is no evidence of focal airspace disease, pulmonary edema, suspicious pulmonary nodule/mass, pleural effusion, or pneumothorax. No acute bony abnormalities are identified. IMPRESSION: Cardiomegaly without evidence of acute cardiopulmonary disease. Electronically Signed   By: Margarette Canada M.D.   On: 11/13/2021 15:18      EKG: personally reviewed my interpretation is sinus rhythm with LVH. Prior EKG with similar findings  ASSESSMENT & PLAN:   Assessment & Plan by Problem: Principal Problem:   Hypoxia   [name] is 71yo person living with a history of chronic hypoxic respiratory failure, OSA, HFpEF, HTN, HLD, CAD s/p  CABG, L cerebellar CVA with residual weakness, presenting to ED with shortness of breath admitted acute on chronic respiratory failure on hospital  day 0  Acute on Chronic Hypoxic respiratory failure OSA HFpEF Chronic supplemental O2 use, 3L. Patient with history of elevated R hemidiaphragm of unknown etiology but documented in EMR since 2019,  noncompliant with CPAP 2/2 being unable to set machine up nightly, and compliant with daily furosemide therapy. No evidence of volume overload on exam, no leukocytosis, BNP 17.5 and CXR and CT PE without evidence PE and with chronic elevation of the hemidiaphragm with chronic atelectasis in bilateral lung bases.  COVID and respiratory panels, negative. Given history and clinical improvement since arrival, patient's bed difficulties, positioning problems, and inadequate access to home O2 precipitated hypoxia. Patient now stable on 2L of O2.  -Continue on home O2 -Incentive spirometer therapy, ordered -Consider CPAP at night -CTM CBC and fever curves -Monitor BMP and electrolytes -Monitor fluid status -Resume home Furosemide 20 mg daily  Hypokalemia 3.1 on admission. S/p PO KCl -F/u repeat BMP  Pressure ulcers Intertrigo Morbid obesity Patient spends >90% of dat in hospital bed at  baseline. Found to have multiple non purulent pressure ulcers and skin breakdown with erythema in skin folds in back, under breast, groin, and legs, consistent with intertrigo. Unclear how often patient gets evaluated for full skin exam or care.  -Wound care consulted, pending -Medihoney paste -Miconazole powder  L PICA cerebellar CVA 2018 Deconditioning Residual lower extremity weakness, currently limited mobility, rendering her bedridden. Patient previously on PT 3x/week until 05/2021. Has not been on PT since with multiple failed referrals from PCP, last one on 11/05/2021. -Clopidogrel 75mg  -PT/OT consulted, pending  HTN CAD S/p CABG Last lipid panel in 2021 with total cholesterol >200. No LDL on file. -Holding BP meds: metoprolol 25mg  BID and  -Results: Lipitor 80 mg, Plavix 75mg  -F/u lipid panel  ?Pulmonary HTN Prior ECHO in 2019 with mildly dilated RV and RA, and pulmonary artery with moderately increased pressure at 55 mm hg. Pulmonary hypertension is likely, secondary to OSA and HFpEF. -Encourage home CPAP machine use -Consider repeat ECHO to assess progression  DM Diabetic neuropathy 11/2019 A1c 5.8.  -A1c, pending -SSI -Continue Gabapentin 100 mg  R eye conjunctivitis -Continue erythromycin ointment  Asymptomatic bacteria Patient with chronic PurWick use without current lower urinary tract infection symptoms, likely colonized. VSS and mentating well.  -Continue to monitor   Social determinants of health Patient has daily assistance from [facility] aide and is seen by NP monthly. However, she is in need of a new DME bed, which has been faulty for a week and is likely the culprit of patient's recent decline. -TOC consult to assist with home DME needs  Diet: Heart Healthy VTE: Enoxaparin Code: Full  Prior to Admission Living Arrangement: Home, living alone Anticipated Discharge Location:  pending PT eval Barriers to Discharge: [facility] and PT/OT consult  Dispo: Admit patient  to Observation with expected length of stay less than 2 midnights.  Signed:   12/2019, MD Internal Medicine Resident PGY-1 11/14/2021, 12:39 AM

## 2021-11-15 LAB — BASIC METABOLIC PANEL
Anion gap: 9 (ref 5–15)
BUN: 6 mg/dL — ABNORMAL LOW (ref 8–23)
CO2: 40 mmol/L — ABNORMAL HIGH (ref 22–32)
Calcium: 8.9 mg/dL (ref 8.9–10.3)
Chloride: 95 mmol/L — ABNORMAL LOW (ref 98–111)
Creatinine, Ser: 0.68 mg/dL (ref 0.44–1.00)
GFR, Estimated: >60 mL/min (ref >60)
Glucose, Bld: 123 mg/dL — ABNORMAL HIGH (ref 70–99)
Potassium: 4.3 mmol/L (ref 3.5–5.1)
Sodium: 144 mmol/L (ref 135–145)

## 2021-11-15 LAB — GLUCOSE, CAPILLARY
Glucose-Capillary: 98 mg/dL (ref 70–99)
Glucose-Capillary: 138 mg/dL — ABNORMAL HIGH (ref 70–99)
Glucose-Capillary: 99 mg/dL (ref 70–99)
Glucose-Capillary: 151 mg/dL — ABNORMAL HIGH (ref 70–99)

## 2021-11-15 LAB — VITAMIN B12: Vitamin B-12: 1127 pg/mL — ABNORMAL HIGH (ref 180–914)

## 2021-11-15 LAB — FOLATE: Folate: 8.8 ng/mL (ref >5.9)

## 2021-11-15 NOTE — Progress Notes (Addendum)
   Subjective:  Patient feels well overall. She states that wound care has been helpful and has had no shortness of breath. Patient denies dysuria.   Objective:  Vital signs in last 24 hours: Vitals:   11/14/21 1634 11/14/21 2008 11/15/21 0612 11/15/21 0833  BP: (!) 110/59 123/68 111/62 117/69  Pulse: (!) 104 97 94 93  Resp: 18 17 17 16   Temp: 98 F (36.7 C) 98.1 F (36.7 C) 97.6 F (36.4 C) 98 F (36.7 C)  TempSrc: Oral Oral Oral Oral  SpO2: 94% 100% 100% 100%   Weight change:   Intake/Output Summary (Last 24 hours) at 11/15/2021 1232 Last data filed at 11/15/2021 1015 Gross per 24 hour  Intake 240 ml  Output 300 ml  Net -60 ml   Physical Exam: General: sitting up in bed in no acute distress Head: normocephalic, atraumatic Cardiovascular: regular rate and rhythm without murmurs, rubs, or gallops,  Respiratory: normal respiratory effort, lungs CTAB Abdominal: no tenderness to palpation Skin: warm, well-perfused Neurological: alert and oriented x 3 Psychological: Normal mood and affect   Assessment/Plan:  Principal Problem:   Hypoxia Active Problems:   Pressure injury of skin   1. OSA HFpEF Patient now stable on 2L of O2 (home regimen has been 3 L). She has also been provided a CPAP which she states she will use.  Home dose of oral Lasix 20 mg resumed yesterday. Euvolemic on exam. -Continue 2L O2 -Continue Furosemide -Monitor fluid status  2. Hypokalemia Resolved  3. Pressure ulcers Intertrigo Wound care consulted and continued Medihoney paste and began Nystatin topical powder, and Desitin. Patient reports feeling much more comfortable. -CTM   4. L PICA cerebellar CVA 2018 Deconditioning PT/OT both recommend SNF to which patient is agreeable. SW has began SNF work-up and will provide patient with bed offers when available. -Clopidogrel 75mg   5. HTN CAD S/p CABG BP's stable since admission without medication. Patient's LDL is 132 which is above  secondary prevention goal (< 70). Given imminent discharge, will instruct PCP to f/u with potentially adding second agent (e.g. Zetia).    -Continue Lipitor 80 mg, Plavix 75mg    6. Pulmonary HTN Prior ECHO in 2019 with mildly dilated RV and RA, and pulmonary artery with moderately increased pressure at 55 mm hg. Pulmonary hypertension is likely secondary to OSA. -Encourage home CPAP machine use   7. DM Diabetic neuropathy A1c is 5.1 and CBG's have been stable. -SSI  8. Right eye conjunctivitis Resolved   9. Asymptomatic bacteria Patient with chronic PurWick use without current lower urinary tract infection symptoms, likely colonized.  -Continue to monitor    10. Social determinants of health PT/OT both recommend SNF and patient is agreeable. SNF plan in process. Patient has daily assistance from [facility] aide and is seen by NP monthly. However, she is in need of a new DME bed, which has been faulty for a week. Repair order has been sent for when patient returns home.    LOS: 2 days  Stanford Breed, Medical Student 11/15/2021, 12:32 PM

## 2021-11-15 NOTE — TOC Initial Note (Signed)
Transition of Care [facility]) - Initial/Assessment Note    Patient Details  Name: Megan Atkinson MRN: [id number] Date of Birth: [birth year]  Transition of Care [facility]) CM/SW Contact:    Marilu Favre, RN Phone Number: 11/15/2021, 11:54 AM  Clinical Narrative:                 Patient from home alone.   Has caregiver 7 days a week for 3 hours a day through [facility]   Patient has home oxygen through [place].   Patient also has hospital bed through [facility] and reports it is "broken". NCM called Lacresia with [facility] , she will enter a repair ticket and call patient to discuss.   Discussed PT recommendations for SNF. Patient in agreement. She has been to [facility] and they would be her preference. SW has began SNF work up and will provide patient with bed offers when available   Expected Discharge Plan: [place] Barriers to Discharge: Continued Medical Work up   Patient Goals and CMS Choice Patient states their goals for this hospitalization and ongoing recovery are:: to get stronger CMS Medicare.gov Compare Post Acute Care list provided to:: Patient    Expected Discharge Plan and Services Expected Discharge Plan: [place]   Discharge Planning Services: CM Consult Post Acute Care Choice: [place] Living arrangements for the past 2 months: Single Family Home                 DME Arranged: N/A         HH Arranged: NA          Prior Living Arrangements/Services Living arrangements for the past 2 months: Single Family Home Lives with:: Self Patient language and need for interpreter reviewed:: Yes Do you feel safe going back to the place where you live?: Yes      Need for Family Participation in Patient Care: Yes (Comment) Care giver support system in place?:  (see note) Current home services: DME Criminal Activity/Legal Involvement Pertinent to Current Situation/Hospitalization: No -  Comment as needed  Activities of Daily Living Home Assistive Devices/Equipment: Environmental consultant (specify type), Wheelchair ADL Screening (condition at time of admission) Patient's cognitive ability adequate to safely complete daily activities?: Yes Is the patient deaf or have difficulty hearing?: No Does the patient have difficulty seeing, even when wearing glasses/contacts?: No Does the patient have difficulty concentrating, remembering, or making decisions?: No Patient able to express need for assistance with ADLs?: Yes Does the patient have difficulty dressing or bathing?: Yes Independently performs ADLs?: No Communication: Independent Dressing (OT): Needs assistance Is this a change from baseline?: Pre-admission baseline Grooming: Needs assistance Is this a change from baseline?: Pre-admission baseline Feeding: Independent Bathing: Needs assistance Is this a change from baseline?: Pre-admission baseline Toileting: Needs assistance Is this a change from baseline?: Pre-admission baseline In/Out Bed: Needs assistance Is this a change from baseline?: Pre-admission baseline Walks in Home: Needs assistance Is this a change from baseline?: Pre-admission baseline Does the patient have difficulty walking or climbing stairs?: Yes Weakness of Legs: Both Weakness of Arms/Hands: Both  Permission Sought/Granted   Permission granted to share information with : No              Emotional Assessment Appearance:: Appears stated age Attitude/Demeanor/Rapport: Engaged Affect (typically observed): Accepting Orientation: : Oriented to Situation, Oriented to  Time, Oriented to Place, Oriented to Self Alcohol / Substance Use: Not Applicable Psych Involvement: No (comment)  Admission diagnosis:  SOB (shortness of breath) [R06.02] Hypoxia [R09.02] Acute respiratory failure with hypoxia (HCC) [J96.01] Skin breakdown [R23.8] Acute conjunctivitis of right eye, unspecified acute conjunctivitis type  [H10.31] Patient Active Problem List   Diagnosis Date Noted   Hypoxia 11/14/2021   Pressure injury of skin 11/14/2021   Hx of CABG 05/01/2019   Chronic respiratory failure (HCC) 05/01/2019   Renal insufficiency 07/29/2017   Acute respiratory failure (HCC) 07/29/2017   Pancreatic mass- needs MRI 07/08/2017   Emphysematous pyelonephritis of left kidney 07/06/2017   Acute diastolic heart failure (HCC)    Acute hypoxemic respiratory failure (HCC) 07/05/2017   Functional urinary incontinence 06/28/2017   Mycotic toenails 06/28/2017   Intertrigo 06/28/2017   Bilateral leg edema 06/28/2017   Cerebral thrombosis with cerebral infarction 05/16/2017   SIRS (systemic inflammatory response syndrome) (HCC) 05/13/2017   Acute lower UTI 05/13/2017   Acute kidney injury (HCC) 05/12/2017   Elevated troponin 05/12/2017   Dehydration 05/12/2017   Hypernatremia 05/12/2017   Elevated CK 05/12/2017   Prolonged QT interval 05/12/2017   Leukocytosis 05/12/2017   Acute renal failure (ARF) (HCC) 05/12/2017   Acute blood loss anemia    Elevated serum creatinine    Slow transit constipation    Benign essential HTN    Generalized anxiety disorder    Gait disturbance, post-stroke 03/13/2016   Cerebellar stroke, acute (HCC) 03/10/2016   Super obese    Prediabetes    Ataxia, post-stroke    Acute cystitis without hematuria    CVA (cerebral vascular accident) (HCC) 03/06/2016   Morbid obesity (HCC) 02/29/2016   OSA (obstructive sleep apnea) 11/24/2014   Dyspnea 10/02/2014   Anemia 04/05/2011   Urinary tract infection, site not specified 04/05/2011   Non-insulin dependent type 2 diabetes mellitus (HCC) 02/22/2011   Hyperlipidemia 02/22/2011   CAD (coronary artery disease) 01/07/2011   Hypertension 01/07/2011   PCP:  Annita Brod, MD Pharmacy:   [facility] [address] [place] - [address] [place] [address] Phone: [phone] Fax:  [phone]  [id number] [place], [place] - [address] AT [facility] [address] [place] [address] Phone: [phone] Fax: [phone]     Social Determinants of Health (SDOH) Interventions    Readmission Risk Interventions     No data to display

## 2021-11-15 NOTE — NC FL2 (Signed)
[place] LEVEL OF CARE SCREENING TOOL     IDENTIFICATION  Patient Name: Megan Atkinson Birthdate: March 29, 1950 Sex: female Admission Date (Current Location): 11/13/2021  [facility] and [place] Number:  Herbalist and Address:  [facility], [place] [address] [place], [place] [address]      Provider Number: [id number]  Attending Physician Name and Address:  Angelica Pou, MD  Relative Name and Phone Number:  Berla, Bouey [phone]    Current Level of Care: Hospital Recommended Level of Care: [place] Prior Approval Number:    Date Approved/Denied:   PASRR Number: [id number]  Discharge Plan: SNF    Current Diagnoses: Patient Active Problem List   Diagnosis Date Noted   Hypoxia 11/14/2021   Pressure injury of skin 11/14/2021   Hx of CABG 05/01/2019   Chronic respiratory failure ([place]) 05/01/2019   Renal insufficiency 07/29/2017   Acute respiratory failure ([place]) 07/29/2017   Pancreatic mass- needs MRI 07/08/2017   Emphysematous pyelonephritis of left kidney [id number]   Acute diastolic heart failure ([place])    Acute hypoxemic respiratory failure ([place]) 07/05/2017   Functional urinary incontinence 06/28/2017   Mycotic toenails 06/28/2017   Intertrigo 06/28/2017   Bilateral leg edema 06/28/2017   Cerebral thrombosis with cerebral infarction 05/16/2017   SIRS (systemic inflammatory response syndrome) ([place]) 05/13/2017   Acute lower UTI 05/13/2017   Acute kidney injury ([place]) 05/12/2017   Elevated troponin 05/12/2017   Dehydration 05/12/2017   Hypernatremia 05/12/2017   Elevated CK 05/12/2017   Prolonged QT interval 05/12/2017   Leukocytosis 05/12/2017   Acute renal failure (ARF) ([place]) 05/12/2017   Acute blood loss anemia    Elevated serum creatinine    Slow transit constipation    Benign essential HTN    Generalized anxiety disorder    Gait disturbance, post-stroke 03/13/2016   Cerebellar  stroke, acute ([place]) 03/10/2016   Super obese    Prediabetes    Ataxia, post-stroke    Acute cystitis without hematuria    CVA (cerebral vascular accident) ([place]) 03/06/2016   Morbid obesity ([place]) 02/29/2016   OSA (obstructive sleep apnea) 11/24/2014   Dyspnea 10/02/2014   Anemia 04/05/2011   Urinary tract infection, site not specified 04/05/2011   Non-insulin dependent type 2 diabetes mellitus ([place]) 02/22/2011   Hyperlipidemia 02/22/2011   CAD (coronary artery disease) 01/07/2011   Hypertension 01/07/2011    Orientation RESPIRATION BLADDER Height & Weight     Self, Time, Situation, Place  O2 Incontinent, External catheter Weight:   Height:     BEHAVIORAL SYMPTOMS/MOOD NEUROLOGICAL BOWEL NUTRITION STATUS      Continent Diet (see DC summary)  AMBULATORY STATUS COMMUNICATION OF NEEDS Skin   Total Care Verbally Other (Comment) (moisture associate skin damage)                       Personal Care Assistance Level of Assistance  Bathing, Feeding, Dressing Bathing Assistance: Maximum assistance Feeding assistance: Limited assistance Dressing Assistance: Maximum assistance     Functional Limitations Info  Sight, Hearing, Speech Sight Info: Adequate Hearing Info: Adequate Speech Info: Adequate    SPECIAL CARE FACTORS FREQUENCY  PT (By licensed PT), OT (By licensed OT)     PT Frequency: 5x week OT Frequency: 5x week            Contractures Contractures Info: Not present    Additional Factors Info  Code Status, Allergies, Insulin Sliding Scale Code Status Info:  full Allergies Info: Lisinopril, Metformin, Metformin And Related   Insulin Sliding Scale Info: Novolog: see DC summary       Current Medications (11/15/2021):  This is the current hospital active medication list Current Facility-Administered Medications  Medication Dose Route Frequency Provider Last Rate Last Admin   acetaminophen (TYLENOL) tablet 650 mg  650 mg Oral Q6H PRN Lacinda Axon, MD    650 mg at 11/14/21 1610   Or   acetaminophen (TYLENOL) suppository 650 mg  650 mg Rectal Q6H PRN Lacinda Axon, MD       atorvastatin (LIPITOR) tablet 80 mg  80 mg Oral Daily Lacinda Axon, MD   80 mg at 11/15/21 0916   clopidogrel (PLAVIX) tablet 75 mg  75 mg Oral Daily Lacinda Axon, MD   75 mg at 11/15/21 0915   enoxaparin (LOVENOX) injection 40 mg  40 mg Subcutaneous Q24H Lacinda Axon, MD   40 mg at 11/15/21 6967   erythromycin ophthalmic ointment   Right Eye TID PRN Lacinda Axon, MD       feeding supplement (ENSURE ENLIVE / ENSURE PLUS) liquid 237 mL  237 mL Oral BID BM Lacinda Axon, MD   237 mL at 11/15/21 0917   furosemide (LASIX) tablet 20 mg  20 mg Oral Daily Lacinda Axon, MD   20 mg at 11/15/21 0916   insulin aspart (novoLOG) injection 0-15 Units  0-15 Units Subcutaneous TID WC [place], Charisse March, MD       leptospermum manuka honey (MEDIHONEY) paste 1 Application  1 Application Topical Daily Lacinda Axon, MD   1 Application at 89/38/10 [phone]   liver oil-zinc oxide (DESITIN) 40 % ointment   Topical BID Angelica Pou, MD   Given at 11/15/21 [phone]   liver oil-zinc oxide (DESITIN) 40 % ointment   Topical PRN Kaleen Mask, RPH       nystatin (MYCOSTATIN/NYSTOP) topical powder   Topical BID Angelica Pou, MD   Given at 11/15/21 [phone]   polyvinyl alcohol (LIQUIFILM TEARS) 1.4 % ophthalmic solution 2 drop  2 drop Both Eyes PRN Lacinda Axon, MD       senna-docusate (Senokot-S) tablet 1 tablet  1 tablet Oral QHS PRN Lacinda Axon, MD         Discharge Medications: Please see discharge summary for a list of discharge medications.  Relevant Imaging Results:  Relevant Lab Results:   Additional Information SS#: [id number]  Joanne Chars, LCSW

## 2021-11-15 NOTE — Progress Notes (Signed)
CPAP set up and ready at bedside. Pt is not ready to wear CPAP at this time, pt stated she will call nurse when ready. RN made aware.

## 2021-11-16 LAB — BASIC METABOLIC PANEL
Anion gap: 6 (ref 5–15)
BUN: 7 mg/dL — ABNORMAL LOW (ref 8–23)
CO2: 41 mmol/L — ABNORMAL HIGH (ref 22–32)
Calcium: 8.6 mg/dL — ABNORMAL LOW (ref 8.9–10.3)
Chloride: 96 mmol/L — ABNORMAL LOW (ref 98–111)
Creatinine, Ser: 0.9 mg/dL (ref 0.44–1.00)
GFR, Estimated: >60 mL/min (ref >60)
Glucose, Bld: 117 mg/dL — ABNORMAL HIGH (ref 70–99)
Potassium: 4.2 mmol/L (ref 3.5–5.1)
Sodium: 143 mmol/L (ref 135–145)

## 2021-11-16 LAB — GLUCOSE, CAPILLARY: Glucose-Capillary: 93 mg/dL (ref 70–99)

## 2021-11-16 LAB — URINALYSIS, ROUTINE W REFLEX MICROSCOPIC
Bilirubin Urine: NEGATIVE
Glucose, UA: NEGATIVE mg/dL
Ketones, ur: NEGATIVE mg/dL
Nitrite: POSITIVE — AB
Protein, ur: 100 mg/dL — AB
Specific Gravity, Urine: 1.015 (ref 1.005–1.030)
pH: 6 (ref 5.0–8.0)

## 2021-11-16 LAB — URINALYSIS, MICROSCOPIC (REFLEX): WBC, UA: >50 WBC/hpf (ref 0–5)

## 2021-11-16 MED ORDER — METOPROLOL TARTRATE 12.5 MG HALF TABLET
12.5000 mg | ORAL_TABLET | Freq: Two times a day (BID) | ORAL | Status: DC
  Administered 2021-11-16: 12.5 mg via ORAL
  Filled 2021-11-16: qty 1

## 2021-11-16 MED ORDER — ZINC OXIDE 40 % EX OINT: TOPICAL_OINTMENT | CUTANEOUS | 0 refills | Status: AC | PRN

## 2021-11-16 MED ORDER — MEDIHONEY WOUND/BURN DRESSING EX PSTE: 1.0000 | PASTE | Freq: Every day | CUTANEOUS | Status: AC

## 2021-11-16 MED ORDER — ENSURE ENLIVE PO LIQD: 237.0000 mL | Freq: Two times a day (BID) | ORAL | 12 refills | Status: AC

## 2021-11-16 MED ORDER — ERYTHROMYCIN 5 MG/GM OP OINT: TOPICAL_OINTMENT | Freq: Three times a day (TID) | OPHTHALMIC | 0 refills | Status: AC | PRN

## 2021-11-16 MED ORDER — FOSFOMYCIN TROMETHAMINE 3 G PO PACK
3.0000 g | PACK | Freq: Once | ORAL | Status: AC
  Administered 2021-11-16: 3 g via ORAL
  Filled 2021-11-16: qty 3

## 2021-11-16 MED ORDER — SENNOSIDES-DOCUSATE SODIUM 8.6-50 MG PO TABS: 1.0000 | ORAL_TABLET | Freq: Every evening | ORAL | Status: AC | PRN

## 2021-11-16 NOTE — Discharge Summary (Addendum)
Name: Megan Atkinson MRN: [id number] DOB: 1950/03/08 71 y.o. PCP: Annita Brod, MD  Date of Admission: 11/13/2021  2:15 PM Date of Discharge:  11/16/2021 Attending Physician: Miguel Aschoff, MD  Discharge Diagnosis: 1. Principal Problem:   Acute hypoxia due to temporary loss of access to supplemental 02   Chronic hypoxic respiratory failure     Moisture associated skin damage  Discharge Medications: Allergies as of 11/16/2021       Reactions   Lisinopril Cough   cough   Metformin Other (See Comments)   Metformin And Related Diarrhea   A lot of fluid loss from bowel and diarrhea        Medication List     STOP taking these medications    collagenase 250 UNIT/GM ointment Commonly known as: SANTYL Replaced by: leptospermum manuka honey Pste paste   miconazole 2 % powder Commonly known as: MICOTIN   Nyamyc powder Generic drug: nystatin       TAKE these medications    acetaminophen 325 MG tablet Commonly known as: TYLENOL Take 650 mg by mouth every 6 (six) hours as needed for moderate pain.   atorvastatin 80 MG tablet Commonly known as: LIPITOR TAKE 1 TABLET BY MOUTH ONCE DAILY AT  6  PM What changed:  how much to take how to take this when to take this Another medication with the same name was removed. Continue taking this medication, and follow the directions you see here.   clopidogrel 75 MG tablet Commonly known as: PLAVIX Take 1 tablet (75 mg total) by mouth daily. What changed: Another medication with the same name was removed. Continue taking this medication, and follow the directions you see here.   erythromycin ophthalmic ointment Place into the right eye 3 (three) times daily as needed (eye irritition).   feeding supplement Liqd Take 237 mLs by mouth 2 (two) times daily between meals.   fluticasone 50 MCG/ACT nasal spray Commonly known as: FLONASE Place 1 spray into both nostrils daily. What changed:  when to take  this reasons to take this   furosemide 20 MG tablet Commonly known as: LASIX Take 1 tablet (20 mg total) by mouth daily.   leptospermum manuka honey Pste paste Apply 1 Application topically daily. Start taking on: November 17, 2021 Replaces: collagenase 250 UNIT/GM ointment   LISINOPRIL PO Take 1 tablet by mouth daily.   liver oil-zinc oxide 40 % ointment Commonly known as: DESITIN Apply topically as needed for irritation (when turning or cleaning). Apply Desitin to buttocks/sacrum/inner gluteal fold PRN when turning or cleaning   Metamucil Fiber Chew Chew 1 tablet by mouth daily as needed (for constipation).   metoprolol tartrate 25 MG tablet Commonly known as: LOPRESSOR Take 12.5 mg by mouth 2 (two) times daily.   protein supplement shake Liqd Commonly known as: PREMIER PROTEIN Take 325 mLs (11 oz total) by mouth 2 (two) times daily between meals.   senna-docusate 8.6-50 MG tablet Commonly known as: Senokot-S Take 1 tablet by mouth at bedtime as needed for mild constipation or moderate constipation. What changed:  how much to take when to take this reasons to take this   VIVACTIL PO Take 1 tablet by mouth daily.               Discharge Care Instructions  (From admission, onward)           Start     Ordered   11/16/21 0000  Discharge wound care:  Comments: 1. Apply Desitin to buttocks/sacrum/inner gluteal fold BID and PRN when turning or cleaning 2. Measure and cut length of InterDry to fit in skin folds that have skin breakdown  Tuck InterDry fabric into skin folds in a single layer, allow for 2 inches of overhang from skin edges to allow for wicking to occur May remove to bathe; dry area thoroughly and then tuck into affected areas again  Do not apply any creams or ointments when using InterDry DO NOT THROW AWAY FOR 5 DAYS unless soiled with stool DO NOT [facility] product, this will inactivate the silver in the material  New sheet of Interdry should  be applied after 5 days of use if patient continues to have skin breakdown   11/16/21 1044            Disposition and follow-up:   Megan Atkinson was discharged from [facility] in Stable condition.  At the hospital follow up visit please address:  1. Pressure and moisture associated skin injury  (ensure wounds are being treated appropriately); ensure patient is using CPAP nightly;nutrition (ensure she is maintaining appropriate caloric intake); Hyperlipidemia (consider addition of second lipid lowering medication to reach target of LDL < 70 if consistent with her care goals).   2.  Labs / imaging needed at time of follow-up: None  3.  Pending labs/ test needing follow-up: Urinalysis   Follow-up Appointments:   Hospital Course by problem list: 1. Chronic Hypoxic Respiratory Failure OSA HFpEF Patient presented to the ED with shortness of breath. She is on 3 L at baseline but her home hospital bed had broken and she was lying in bed uncomfortably and unable to use her [place]. Patient has also been nonadherent with CPAP due to difficulties using the machine. She notes that it is challenging to get the machine set up at night due to her dyspnea. Patient placed on 2 L [place] and home Lasix and had appropriate O2 saturations. She also remained euvolemic.  Patient has home health that comes daily and an NP that comes once a month for evaluation. PT/OT consulted and recommended SNF to which patient is agreeable. Patient to be discharged to [facility] and she has a hospital bed replacement ordered.    2.Moisture associated skin injury and intertrigo Patient presented with skin fold and perineal maceration and intertrigo. Vitals stable upon admission. Wound care consulted and patient has been treated with Medihoney paste and Desitin. Patient reports feeling much more comfortable. Discharge wound care instructions have been sent with her on discharge.    3. L PICA cerebellar  CVA 2018 Deconditioning Patient continued on home regimen of Clopidogrel and will be discharged to SNF.   4. HTN CAD S/p CABG HLD BP's stable since admission without medication. Resumed her home lasix and metoprolol the day prior to discharge. Patient continued on Lipitor and Plavix. Patient's LDL is 132 which is above secondary prevention goal (< 70). We will instruct PCP to f/u with potentially adding second agent (e.g. Zetia).   5. Pulmonary HTN Prior ECHO in 2019 with mildly dilated RV and RA, and pulmonary artery with moderately increased pressure at 55 mm hg. Pulmonary hypertension is likely secondary to OSA. Patient encouraged to use CPAP.    6. DM Diabetic neuropathy A1c is 5.1 and CBG's have been stable since admission without using SSI.   7. Right eye conjunctivitis Patient presented with conjunctival injection without purulence. Patient given dose of topical erythromycin with improvement in symptoms.  8. Asymptomatic bacteruria/pyuria/microhematuria Patient's catheterized UA indicative of UTI but completely asymptomatic; patient uses chronic PureWicks and cannot manage her own perineal hygiene. Patient given fosfomycin x1 dose for possible UTI in the setting of her urinalysis.   9. Macrocytosis  Unclear etiology. Her vitamin b12 and folate are normal to high. Not on any causal medications.  Incidentally noted and not further evaluated.      Component Ref Range & Units 1 d ago 10 yr ago  Vitamin B-12 180 - 914 pg/mL 1,127 High  604 R        Component Ref Range & Units 1 d ago 10 yr ago  Folate >5.9 ng/mL 8.8 24.9    Continue to monitor in the outpatient setting.   Subjective: Patient is eager to go home. She denies any pain or dyspnea on her home O2.   Discharge Exam:   BP (!) 141/65 (BP Location: Left Arm)   Pulse 85   Temp 98.1 F (36.7 C)   Resp 17   SpO2 90%  Discharge exam:   Constitutional: Chronically ill appearing, obese, and in no distress. She is  unable to reposition herself in the bed at baseline.  She is nonambulatory.  Feet are chronically plantar flexed with overgrown toenails.   Eyes - conjunctival erythema resolved Neck: Normal range of motion.  Cardiovascular: Normal rate, regular rhythm, intact distal pulses. No gallop and no friction rub.  No murmur heard. No lower extremity edema  Pulmonary: Non labored breathing on 2L [place], no wheezing or rales  Abdominal: Soft. Normal bowel sounds. Non distended and non tender Musculoskeletal: Normal range of motion.     Neurological: Alert and oriented to person, place, and time. Non focal  Skin: Skin is warm and dry.    Pertinent Labs, Studies, and Procedures:        Component Ref Range & Units 3 d ago (11/13/21) 3 d ago (11/13/21)  Troponin I (High Sensitivity) <18 ng/L 8 8 CM             Component Ref Range & Units 3 d ago (11/13/21) 4 yr ago (07/29/17) 4 yr ago (07/04/17) 4 yr ago (05/12/17)  B Natriuretic Peptide 0.0 - 100.0 pg/mL 17.5 1,160.0 High  CM 113.0 High  CM 59.1 CM        Latest Ref Rng & Units 11/16/2021    8:20 AM 11/15/2021    7:52 AM 11/14/2021    3:40 AM  BMP  Glucose 70 - 99 mg/dL 151  761  607   BUN 8 - 23 mg/dL 7  6  <5   Creatinine 3.71 - 1.00 mg/dL 0.62  6.94  8.54   Sodium 135 - 145 mmol/L 143  144  146   Potassium 3.5 - 5.1 mmol/L 4.2  4.3  3.2   Chloride 98 - 111 mmol/L 96  95  95   CO2 22 - 32 mmol/L 41  40  41   Calcium 8.9 - 10.3 mg/dL 8.6  8.9  8.7      CT Angio Chest PE W and/or Wo Contrast  Result Date: 11/13/2021 CLINICAL DATA:  Pulmonary embolus suspected with high probability. EXAM: CT ANGIOGRAPHY CHEST WITH CONTRAST TECHNIQUE: Multidetector CT imaging of the chest was performed using the standard protocol during bolus administration of intravenous contrast. Multiplanar CT image reconstructions and MIPs were obtained to evaluate the vascular anatomy. RADIATION DOSE REDUCTION: This exam was performed according to the departmental  dose-optimization program which includes automated  exposure control, adjustment of the mA and/or kV according to patient size and/or use of iterative reconstruction technique. CONTRAST:  60mL OMNIPAQUE IOHEXOL 350 MG/ML SOLN COMPARISON:  07/29/2017 FINDINGS: Cardiovascular: Vascular crowding due to shallow inspiration limits examination. There is moderately good visualization of the central and proximal segmental pulmonary arteries. No filling defects are demonstrated suggesting no evidence of significant pulmonary embolus. Heart size is normal. Normal caliber thoracic aorta. No aortic dissection. Coronary artery and aortic calcifications. Postoperative changes consistent with coronary bypass. Mediastinum/Nodes: Esophagus is decompressed. No significant lymphadenopathy. Lungs/Pleura: Shallow inspiration with elevation of the right hemidiaphragm representing chronic finding which was also present on the prior study. Atelectasis in both lung bases, greater on the right. No pleural effusions. No pneumothorax. Upper Abdomen: The gallbladder appears distended. Unable to assess for gallstones due to artifact. Musculoskeletal: Sternotomy wires. Degenerative changes in the spine. No acute bony abnormalities. Review of the MIP images confirms the above findings. IMPRESSION: 1. No evidence of significant pulmonary embolus although vascular crowding limits the examination. 2. Chronic elevation of the right hemidiaphragm with atelectasis in both lung bases. 3. Gallbladder is distended. 4. Aortic atherosclerosis. Electronically Signed   By: Burman Nieves M.D.   On: 11/13/2021 20:12   DG Chest Portable 1 View  Result Date: 11/13/2021 CLINICAL DATA:  Shortness of breath. EXAM: PORTABLE CHEST 1 VIEW COMPARISON:  06/02/2019 and prior radiographs FINDINGS: Cardiomegaly, median sternotomy and elevated RIGHT hemidiaphragm again noted. There is no evidence of focal airspace disease, pulmonary edema, suspicious pulmonary  nodule/mass, pleural effusion, or pneumothorax. No acute bony abnormalities are identified. IMPRESSION: Cardiomegaly without evidence of acute cardiopulmonary disease. Electronically Signed   By: Harmon Pier M.D.   On: 11/13/2021 15:18     Discharge Instructions: Discharge Instructions     Discharge wound care:   Complete by: As directed    1. Apply Desitin to buttocks/sacrum/inner gluteal fold BID and PRN when turning or cleaning 2. Measure and cut length of InterDry to fit in skin folds that have skin breakdown  Tuck InterDry fabric into skin folds in a single layer, allow for 2 inches of overhang from skin edges to allow for wicking to occur May remove to bathe; dry area thoroughly and then tuck into affected areas again  Do not apply any creams or ointments when using InterDry DO NOT THROW AWAY FOR 5 DAYS unless soiled with stool DO NOT [facility] product, this will inactivate the silver in the material  New sheet of Interdry should be applied after 5 days of use if patient continues to have skin breakdown       Signed: Marolyn Haller, MD 11/16/2021, 10:49 AM

## 2021-11-16 NOTE — TOC Transition Note (Signed)
Transition of Care [facility]) - CM/SW Discharge Note   Patient Details  Name: Zsofia Prout MRN: [id number] Date of Birth: Sep 24, 1950  [facility] CM/SW Contact:  Tresa Endo Phone Number: 11/16/2021, 12:28 PM   Clinical Narrative:    Patient will DC to: [facility] Anticipated DC date: 11/16/2021 Family notified: Pt Son Transport by: Corey Harold   Per MD patient ready for DC to [facility]. RN to call report prior to discharge [phone]). RN, patient, patient's family, and facility notified of DC. Discharge Summary and FL2 sent to facility. DC packet on chart. Ambulance transport requested for patient.   CSW will sign off for now as social work intervention is no longer needed. Please consult [place] again if new needs arise.     Final next level of care: Skilled Nursing Facility Barriers to Discharge: Continued Medical Work up   Patient Goals and CMS Choice Patient states their goals for this hospitalization and ongoing recovery are:: to get stronger CMS Medicare.gov Compare Post Acute Care list provided to:: Patient    Discharge Placement                       Discharge Plan and Services   Discharge Planning Services: CM Consult Post Acute Care Choice: [place]          DME Arranged: N/A         HH Arranged: NA          Social Determinants of Health (SDOH) Interventions     Readmission Risk Interventions     No data to display

## 2021-11-16 NOTE — Discharge Instructions (Addendum)
For high cholesterol, PCP will need to see if zetia or another medicine is added.   PCP will need to make sure CPAP machine is working and patient is able to tolerate this at night.

## 2021-11-18 LAB — CULTURE, BLOOD (ROUTINE X 2)
Culture: NO GROWTH
Culture: NO GROWTH
Special Requests: ADEQUATE

## 2021-11-25 NOTE — Progress Notes (Signed)
The order in question was not ordered by me.  Will defer to the ordering provider.

## 2022-04-07 DEATH — deceased
